# Patient Record
Sex: Female | Born: 1960 | Race: Black or African American | Hispanic: No | Marital: Single | Smoking: Never smoker
Health system: Southern US, Community
[De-identification: ages and names within clinical notes are randomized; demographics above are authoritative.]

---

## 2021-05-13 ENCOUNTER — Encounter (HOSPITAL_COMMUNITY): Payer: Self-pay

## 2021-05-13 ENCOUNTER — Inpatient Hospital Stay (HOSPITAL_COMMUNITY)
Admission: EM | Admit: 2021-05-13 | Discharge: 2021-05-30 | DRG: 024 | Disposition: A | Discharge status: Rehabilitation Facility | Payer: BLUE CROSS/BLUE SHIELD | Attending: Internal Medicine | Admitting: Internal Medicine

## 2021-05-13 DIAGNOSIS — R414 Neurologic neglect syndrome: Secondary | ICD-10-CM | POA: Diagnosis present

## 2021-05-13 DIAGNOSIS — I618 Other nontraumatic intracerebral hemorrhage: Principal | ICD-10-CM | POA: Diagnosis present

## 2021-05-13 DIAGNOSIS — G911 Obstructive hydrocephalus: Secondary | ICD-10-CM | POA: Diagnosis present

## 2021-05-13 DIAGNOSIS — G8101 Flaccid hemiplegia affecting right dominant side: Secondary | ICD-10-CM | POA: Diagnosis present

## 2021-05-13 DIAGNOSIS — I619 Nontraumatic intracerebral hemorrhage, unspecified: Secondary | ICD-10-CM

## 2021-05-13 DIAGNOSIS — R29810 Facial weakness: Secondary | ICD-10-CM | POA: Diagnosis present

## 2021-05-13 DIAGNOSIS — R509 Fever, unspecified: Secondary | ICD-10-CM | POA: Diagnosis not present

## 2021-05-13 DIAGNOSIS — I610 Nontraumatic intracerebral hemorrhage in hemisphere, subcortical: Secondary | ICD-10-CM | POA: Diagnosis not present

## 2021-05-13 DIAGNOSIS — E079 Disorder of thyroid, unspecified: Secondary | ICD-10-CM | POA: Diagnosis present

## 2021-05-13 DIAGNOSIS — G9389 Other specified disorders of brain: Secondary | ICD-10-CM | POA: Diagnosis present

## 2021-05-13 DIAGNOSIS — I5032 Chronic diastolic (congestive) heart failure: Secondary | ICD-10-CM | POA: Diagnosis present

## 2021-05-13 DIAGNOSIS — E785 Hyperlipidemia, unspecified: Secondary | ICD-10-CM | POA: Diagnosis present

## 2021-05-13 DIAGNOSIS — N898 Other specified noninflammatory disorders of vagina: Secondary | ICD-10-CM

## 2021-05-13 DIAGNOSIS — R471 Dysarthria and anarthria: Secondary | ICD-10-CM | POA: Diagnosis present

## 2021-05-13 DIAGNOSIS — Z7982 Long term (current) use of aspirin: Secondary | ICD-10-CM

## 2021-05-13 DIAGNOSIS — J301 Allergic rhinitis due to pollen: Secondary | ICD-10-CM | POA: Diagnosis present

## 2021-05-13 DIAGNOSIS — Z20822 Contact with and (suspected) exposure to covid-19: Secondary | ICD-10-CM | POA: Diagnosis present

## 2021-05-13 DIAGNOSIS — K5900 Constipation, unspecified: Secondary | ICD-10-CM

## 2021-05-13 DIAGNOSIS — R197 Diarrhea, unspecified: Secondary | ICD-10-CM

## 2021-05-13 DIAGNOSIS — I110 Hypertensive heart disease with heart failure: Secondary | ICD-10-CM | POA: Diagnosis present

## 2021-05-13 DIAGNOSIS — J9601 Acute respiratory failure with hypoxia: Secondary | ICD-10-CM

## 2021-05-13 DIAGNOSIS — I161 Hypertensive emergency: Secondary | ICD-10-CM | POA: Diagnosis present

## 2021-05-13 DIAGNOSIS — Z09 Encounter for follow-up examination after completed treatment for conditions other than malignant neoplasm: Secondary | ICD-10-CM

## 2021-05-13 DIAGNOSIS — D72829 Elevated white blood cell count, unspecified: Secondary | ICD-10-CM | POA: Diagnosis not present

## 2021-05-13 DIAGNOSIS — R4701 Aphasia: Secondary | ICD-10-CM | POA: Diagnosis present

## 2021-05-13 DIAGNOSIS — I615 Nontraumatic intracerebral hemorrhage, intraventricular: Secondary | ICD-10-CM

## 2021-05-13 DIAGNOSIS — G9349 Other encephalopathy: Secondary | ICD-10-CM

## 2021-05-13 DIAGNOSIS — R1310 Dysphagia, unspecified: Secondary | ICD-10-CM | POA: Diagnosis present

## 2021-05-13 DIAGNOSIS — I10 Essential (primary) hypertension: Secondary | ICD-10-CM

## 2021-05-13 DIAGNOSIS — R29717 NIHSS score 17: Secondary | ICD-10-CM | POA: Diagnosis present

## 2021-05-13 DIAGNOSIS — R7303 Prediabetes: Secondary | ICD-10-CM | POA: Diagnosis present

## 2021-05-13 DIAGNOSIS — E861 Hypovolemia: Secondary | ICD-10-CM | POA: Diagnosis not present

## 2021-05-13 DIAGNOSIS — Z4659 Encounter for fitting and adjustment of other gastrointestinal appliance and device: Secondary | ICD-10-CM

## 2021-05-13 DIAGNOSIS — N760 Acute vaginitis: Secondary | ICD-10-CM | POA: Diagnosis not present

## 2021-05-13 DIAGNOSIS — R451 Restlessness and agitation: Secondary | ICD-10-CM | POA: Diagnosis not present

## 2021-05-13 DIAGNOSIS — E871 Hypo-osmolality and hyponatremia: Secondary | ICD-10-CM | POA: Diagnosis not present

## 2021-05-13 MED ORDER — NICARDIPINE HCL IN NACL 20-0.86 MG/200ML-% IV SOLN
3.0000 mg/h | INTRAVENOUS | Status: DC
  Administered 2021-05-13: 6 mg/h via INTRAVENOUS

## 2021-05-13 NOTE — ED Notes (Signed)
HOB at 30

## 2021-05-13 NOTE — ED Triage Notes (Signed)
Pt arrives via [facility] from [facility] in [place] for a hemorrhagic stroke in her thalmus. Last known normal was 19:00. Pt with expressive aphasia and R sided weakness. Pt on nicardipine at 5mg /hr.

## 2021-05-13 NOTE — ED Notes (Signed)
Nicardipine changed to 6mg /hr

## 2021-05-13 NOTE — ED Provider Notes (Signed)
[facility] EMERGENCY DEPARTMENT Provider Note   CSN: [id number] Arrival date & time: 05/13/21  2247     History Chief Complaint  Patient presents with  . Cerebrovascular Accident    hemorrhagic    Marissa Morris is a 60 y.o. female.  The history is provided by the patient and medical records.  Cerebrovascular Accident    60 y.o. F presenting to the ED from [facility] in [place] as a transfer for hemorrhagic stroke.  Patient presented there with right sided weakness, facial droop, difficulty answering questions, and some generalized confusion.  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  She was started on cardene and given keppra for seizure prophylaxis.  Case was discussed with neurology here, Dr. Otelia Limes who accepted in transfer.  Patient awake, alert on arrival, able to answer simple questions.  BP reasonably well controlled 150's systolic, currently on 6mg /hr cardene.  No past medical history on file.  There are no problems to display for this patient.     OB History   No obstetric history on file.     No family history on file.     Home Medications Prior to Admission medications   Not on File    Allergies    Patient has no allergy information on record.  Review of Systems   Review of Systems  Neurological: Positive for speech difficulty and weakness.  All other systems reviewed and are negative.   Physical Exam Updated Vital Signs BP (!) 151/76 (BP Location: Left Arm)   Pulse 90   Temp 98.5 F (36.9 C) (Oral)   Resp (!) 21   Wt 75.3 kg   SpO2 95%   Physical Exam Vitals and nursing note reviewed.  Constitutional:      Appearance: She is well-developed.  HENT:     Head: Normocephalic and atraumatic.  Eyes:     Conjunctiva/sclera: Conjunctivae normal.     Pupils: Pupils are equal, round, and reactive to light.  Cardiovascular:     Rate and Rhythm: Normal rate and regular rhythm.     Heart sounds: Normal heart  sounds.  Pulmonary:     Effort: Pulmonary effort is normal.     Breath sounds: Normal breath sounds.  Abdominal:     General: Bowel sounds are normal.     Palpations: Abdomen is soft.  Musculoskeletal:        General: Normal range of motion.     Cervical back: Normal range of motion.  Skin:    General: Skin is warm and dry.  Neurological:     Mental Status: She is alert.     Comments: Awake, alert, nods head "yes" and "no" but not really giving a lot of verbal responses, right arm weakness noted compared with left, seemingly equal strength BLE     ED Results / Procedures / Treatments   Labs (all labs ordered are listed, but only abnormal results are displayed) Labs Reviewed  RESP PANEL BY RT-PCR (FLU A&B, COVID) ARPGX2  MRSA PCR SCREENING  HIV ANTIBODY (ROUTINE TESTING W REFLEX)  CBC  COMPREHENSIVE METABOLIC PANEL  PROTIME-INR  APTT  LIPID PANEL  HEMOGLOBIN A1C  URINALYSIS, ROUTINE W REFLEX MICROSCOPIC  RAPID URINE DRUG SCREEN, HOSP PERFORMED    EKG None  Radiology CT HEAD WO CONTRAST  Result Date: 05/14/2021 CLINICAL DATA:  Intraparenchymal hemorrhage. Follow-up of scans performed at [place] [place]. EXAM: CT HEAD WITHOUT CONTRAST TECHNIQUE: Contiguous axial images were obtained from  the base of the skull through the vertex without intravenous contrast. COMPARISON:  None. I am not able to view the outside institution scans. FINDINGS: Brain: Intraparenchymal hematoma centered in the left thalamus measures 3.2 x 2.2 x 3.0 cm (volume = 11 cm^3). There is moderate hydrocephalus with periventricular hypoattenuation suggesting transependymal interstitial edema. There is rightward bulging of the septum pellucidum. Large amount of blood in the ventricles. Vascular: No abnormal hyperdensity of the major intracranial arteries or dural venous sinuses. No intracranial atherosclerosis. Skull: The visualized skull base, calvarium and extracranial soft tissues are normal.  Sinuses/Orbits: No fluid levels or advanced mucosal thickening of the visualized paranasal sinuses. No mastoid or middle ear effusion. The orbits are normal. IMPRESSION: 1. Intraparenchymal hematoma centered in the left thalamus with intraventricular extension and moderate hydrocephalus with transependymal interstitial edema. Critical Value/emergent results were called by telephone at the time of interpretation on 05/14/2021 at 12:58 am to provider [facility] , who verbally acknowledged these results. Electronically Signed   By: Deatra Robinson M.D.   On: 05/14/2021 00:58    Procedures Procedures   CRITICAL CARE Performed by: Garlon Hatchet   Total critical care time: 40  minutes  Critical care time was exclusive of separately billable procedures and treating other patients.  Critical care was necessary to treat or prevent imminent or life-threatening deterioration.  Critical care was time spent personally by me on the following activities: development of treatment plan with patient and/or surrogate as well as nursing, discussions with consultants, evaluation of patient's response to treatment, examination of patient, obtaining history from patient or surrogate, ordering and performing treatments and interventions, ordering and review of laboratory studies, ordering and review of radiographic studies, pulse oximetry and re-evaluation of patient's condition.   Medications Ordered in ED Medications  nicardipine (CARDENE) 20mg  in 0.86% saline IV infusion (0.1 mg/ml) (7 mg/hr Intravenous Rate/Dose Change 05/13/21 2328)    ED Course  I have reviewed the triage vital signs and the nursing notes.  Pertinent labs & imaging results that were available during my care of the patient were reviewed by me and considered in my medical decision making (see chart for details).    MDM Rules/Calculators/A&P  59 y.o. F presenting to the ED from OSH [facility]) for hemorrhagic stroke.  Found to have  slurred speech, right sided weakness, and cognitive delays on arrival to their ED, work-up revealed left thalamic hemorrhagic stroke.  She was started on cardene and loaded with IV keppra for seizure prophylaxis and transferred here.  No reported history of stroke in the past.  On arrival to our ED, she is awake but drowsy.  She does nod her head to answer questions but not giving many verbal responses.  She is starting to move right arm (chart from [facility] reports "flaccid RUE") but notably weak compared with left.  She remains on 6mg /hr cardene and BP is stable currently.  Notified neurology, Dr. [facility] of patient's arrival in the ED-- he will admit to ICU.  Covid screen has been sent.  Final Clinical Impression(s) / ED Diagnoses Final diagnoses:  Hemorrhagic stroke [facility])    Rx / DC Orders ED Discharge Orders    None       Otelia Limes, PA-C 05/14/21 0127    Garlon Hatchet, MD 05/14/21 0236

## 2021-05-13 NOTE — H&P (Addendum)
Admission H&P    Chief Complaint: Right sided weakness and headache  HPI: Marissa Morris is a 60 y.o. female presenting to [facility] in transfer from [facility] [phone]) for management of acute intracranial hemorrhage. Per records from [place], EMS had been called to the patient's home and on arrival the patient was found to be slumped over in a chair. She projectile vomited and then was noted to be flaccid on the right with right facial droop and inability to correctly answer questions. On arrival to the ED, BP was 222/128. CT head revealed a 3 cm thalamic hemorrhage with intraventricular extension.   Initial NIHSS was 25. Initial exam by EDP at OSH: Patient awake with slow but comprehensible speech. Oriented to person. Confused. CN were normal. LUE and LLE 5/5, RUE and RLE with flaccid paralysis. Follow up NIHSS was 17.  BP was managed with hydralazine and nicardipine, bringing it down to 163/77. Ondansetron was given for nausea. Keppra 1000 mg was loaded for seizure prophylaxis (Note: no seizures per EDP at OSH).   She is not on and antiplatelet medication or a blood thinner per OSH documentation. Did not fall and strike her head prior to the hemorrhage. Per OSH documents, her PMHx, PSHx, and home meds were unknown. No information on drug allergies.   CT head at OSH (6/1, 8:24 PM): Left thalamic hemorrhage measuring 3 cm with extension into the lateral, third and 4 th ventricles. The ventricles appear mildly enlarged suggestive of early mild hydrocephalus. There is no midline shift or herniation. Diffuse periventricular hyupoattenuation and small right corona radiata lacunar infarct are noted.   EKG: NSR Left ventricular hypertrophy Prolonged QT Nonspecific ST and T wave abnormality  Labs at [facility]: Na 140 K 3.4 Cl 102, Ca 8.9 BUN 14 Cr 0.96 Serum glucose 163 WBC 14.7 Hgb 13.4 Platelets 214 PT 10.6 INR 1.0 PTT 24.5  History reviewed. No  pertinent past medical history.  History reviewed. No pertinent surgical history.  No family history on file. Social History:  has no history on file for tobacco use, alcohol use, and drug use.  Allergies: No Known Allergies  (Not in a hospital admission)   ROS: Unable to obtain due to somnolence.   Physical Examination: Blood pressure (!) 155/83, pulse 91, temperature 98.2 F (36.8 C), temperature source Oral, resp. rate (!) 23, weight 75.3 kg, SpO2 100 %.  HEENT-  [place]/AT  Cardiovascular - RRR, S1 and S2 normal Lungs - Respirations unlabored with equal breath sounds bilaterally.No rhonchi.  Abdomen - Soft and NT with normal bowel sounds Extremities - No edema. Warm and well perfused  Neurologic Examination: Mental Status: Somnolent. Does not open eyes to voice initially, but does open after prolonged sternal rub. She is nonverbal. She murmurs and nods her head to some questions then falls back asleep. She does not follow commands, but exhibits semipurposeful left sided movements to noxious stimuli. GCS 7.  Cranial Nerves: II:  Does not blink to threat when eyelids are held open. PERRL at 2 mm.  III,IV, VI: With eyes open, some roving EOM noted conjugately. With noxious stimulation the patient did briefly fixate on examiner's face and track from left to right.  V,VII: No facial droop, but patient did not grimace to noxious. She does furrow brow on L > R to brow ridge pressure and responds more briskly on the left.  VIII: Did murmur incomprehensibly in response to some questions.  IX,X: Unable to assess.  XI: Head is near  the midline XII: Did not protrude tongue to command.  Motor/Sensory: RUE with increased flexor tone at elbow, wrist and digits; also adducted at the shoulder. Does not move to command or to noxious, but will grimace.  RLE with weak withdrawal to noxious plantar stimulation. Responds less briskly than on the left.  LUE and LLE with 5/5 semipurposeful movement to  noxious . Deep Tendon Reflexes:  3+ bilateral brachioradialis and patellae.  Right toe upgoing, left toe equivocal.  Cerebellar: Unable to assess Gait: Unable to assess  No results found for this or any previous visit (from the past 48 hour(s)). No results found.   Assessment: 60 year old female with acute left thalamic ICH with intraventricular extension 1. Exam reveals a somnolent patient with aphasia, plegic RUE, paretic RLE and right sided sensory deficit 2. CT head at OSH (6/1, 8:24 PM): Left thalamic hemorrhage measuring 3 cm with extension into the lateral, third and 4 th ventricles. The ventricles appear mildly enlarged suggestive of early mild hydrocephalus. There is no midline shift or herniation. Diffuse periventricular hyupoattenuation and small right corona radiata lacunar infarct are noted.  3. Follow up CT head at [facility] (6/2, 12:58 AM): Intraparenchymal hematoma centered in the left thalamus with intraventricular extension and moderate hydrocephalus with transependymal interstitial edema. 4. Most likely etiology for the ICH is severe HTN  Plan: 1. Admit to ICU under the Neurology service 2. MRI/MRA of head 3. Cardiac telemetry 4. TTE 5. PT consult, OT consult, Speech consult 6. BP management with clevidipine drip. SBP goal < 140 7. Frequent neuro checks 8. No antiplatelet medications or anticoagulants. DVT prophylaxis with SCDs 9. Neurosurgery has been consulted. They have recommended repeat CT head at 0700 to assess for stability 10. Will need to obtain medical records from her PCP  Addendum: Repeat CT head at 0621 reveals similar size of the acute left thalamic intraparenchymal hemorrhage. Similar intraventricular extension of hemorrhage and ventriculomegaly.  60 minutes spent in the neurological evaluation and management of this critically ill patient.   Electronically signed: Dr. Caryl Pina 05/13/2021, 11:41 PM

## 2021-05-14 ENCOUNTER — Inpatient Hospital Stay (HOSPITAL_COMMUNITY): Payer: BLUE CROSS/BLUE SHIELD

## 2021-05-14 ENCOUNTER — Encounter (HOSPITAL_COMMUNITY): Payer: Self-pay | Admitting: Neurology

## 2021-05-14 ENCOUNTER — Emergency Department (HOSPITAL_COMMUNITY): Payer: BLUE CROSS/BLUE SHIELD

## 2021-05-14 DIAGNOSIS — R531 Weakness: Secondary | ICD-10-CM | POA: Diagnosis not present

## 2021-05-14 DIAGNOSIS — I619 Nontraumatic intracerebral hemorrhage, unspecified: Secondary | ICD-10-CM | POA: Diagnosis present

## 2021-05-14 DIAGNOSIS — N760 Acute vaginitis: Secondary | ICD-10-CM | POA: Diagnosis not present

## 2021-05-14 DIAGNOSIS — I615 Nontraumatic intracerebral hemorrhage, intraventricular: Secondary | ICD-10-CM

## 2021-05-14 DIAGNOSIS — R7303 Prediabetes: Secondary | ICD-10-CM | POA: Diagnosis present

## 2021-05-14 DIAGNOSIS — I69151 Hemiplegia and hemiparesis following nontraumatic intracerebral hemorrhage affecting right dominant side: Secondary | ICD-10-CM | POA: Diagnosis not present

## 2021-05-14 DIAGNOSIS — I161 Hypertensive emergency: Secondary | ICD-10-CM | POA: Diagnosis present

## 2021-05-14 DIAGNOSIS — R471 Dysarthria and anarthria: Secondary | ICD-10-CM | POA: Diagnosis present

## 2021-05-14 DIAGNOSIS — G911 Obstructive hydrocephalus: Secondary | ICD-10-CM | POA: Diagnosis present

## 2021-05-14 DIAGNOSIS — E079 Disorder of thyroid, unspecified: Secondary | ICD-10-CM | POA: Diagnosis present

## 2021-05-14 DIAGNOSIS — R451 Restlessness and agitation: Secondary | ICD-10-CM | POA: Diagnosis not present

## 2021-05-14 DIAGNOSIS — M1711 Unilateral primary osteoarthritis, right knee: Secondary | ICD-10-CM | POA: Diagnosis not present

## 2021-05-14 DIAGNOSIS — I610 Nontraumatic intracerebral hemorrhage in hemisphere, subcortical: Secondary | ICD-10-CM

## 2021-05-14 DIAGNOSIS — I5032 Chronic diastolic (congestive) heart failure: Secondary | ICD-10-CM | POA: Diagnosis present

## 2021-05-14 DIAGNOSIS — R4182 Altered mental status, unspecified: Secondary | ICD-10-CM | POA: Diagnosis not present

## 2021-05-14 DIAGNOSIS — I110 Hypertensive heart disease with heart failure: Secondary | ICD-10-CM | POA: Diagnosis present

## 2021-05-14 DIAGNOSIS — R1310 Dysphagia, unspecified: Secondary | ICD-10-CM | POA: Diagnosis present

## 2021-05-14 DIAGNOSIS — I10 Essential (primary) hypertension: Secondary | ICD-10-CM | POA: Diagnosis not present

## 2021-05-14 DIAGNOSIS — M792 Neuralgia and neuritis, unspecified: Secondary | ICD-10-CM | POA: Diagnosis not present

## 2021-05-14 DIAGNOSIS — J9601 Acute respiratory failure with hypoxia: Secondary | ICD-10-CM | POA: Diagnosis not present

## 2021-05-14 DIAGNOSIS — I618 Other nontraumatic intracerebral hemorrhage: Secondary | ICD-10-CM | POA: Diagnosis present

## 2021-05-14 DIAGNOSIS — R29810 Facial weakness: Secondary | ICD-10-CM | POA: Diagnosis present

## 2021-05-14 DIAGNOSIS — E871 Hypo-osmolality and hyponatremia: Secondary | ICD-10-CM | POA: Diagnosis not present

## 2021-05-14 DIAGNOSIS — I639 Cerebral infarction, unspecified: Secondary | ICD-10-CM | POA: Diagnosis not present

## 2021-05-14 DIAGNOSIS — E785 Hyperlipidemia, unspecified: Secondary | ICD-10-CM | POA: Diagnosis present

## 2021-05-14 DIAGNOSIS — R509 Fever, unspecified: Secondary | ICD-10-CM | POA: Diagnosis not present

## 2021-05-14 DIAGNOSIS — E861 Hypovolemia: Secondary | ICD-10-CM | POA: Diagnosis not present

## 2021-05-14 DIAGNOSIS — R4701 Aphasia: Secondary | ICD-10-CM | POA: Diagnosis present

## 2021-05-14 DIAGNOSIS — I6389 Other cerebral infarction: Secondary | ICD-10-CM

## 2021-05-14 DIAGNOSIS — R414 Neurologic neglect syndrome: Secondary | ICD-10-CM | POA: Diagnosis present

## 2021-05-14 DIAGNOSIS — G9349 Other encephalopathy: Secondary | ICD-10-CM | POA: Diagnosis present

## 2021-05-14 DIAGNOSIS — G9389 Other specified disorders of brain: Secondary | ICD-10-CM | POA: Diagnosis present

## 2021-05-14 DIAGNOSIS — D72829 Elevated white blood cell count, unspecified: Secondary | ICD-10-CM | POA: Diagnosis not present

## 2021-05-14 DIAGNOSIS — Z20822 Contact with and (suspected) exposure to covid-19: Secondary | ICD-10-CM | POA: Diagnosis present

## 2021-05-14 DIAGNOSIS — G8101 Flaccid hemiplegia affecting right dominant side: Secondary | ICD-10-CM | POA: Diagnosis present

## 2021-05-14 LAB — PROTIME-INR
INR: 1 (ref 0.8–1.2)
Prothrombin Time: 13.1 seconds (ref 11.4–15.2)

## 2021-05-14 LAB — URINALYSIS, ROUTINE W REFLEX MICROSCOPIC
Bacteria, UA: NONE SEEN
Bilirubin Urine: NEGATIVE
Glucose, UA: NEGATIVE mg/dL
Hgb urine dipstick: NEGATIVE
Ketones, ur: NEGATIVE mg/dL
Nitrite: NEGATIVE
Protein, ur: NEGATIVE mg/dL
Specific Gravity, Urine: 1.008 (ref 1.005–1.030)
pH: 9 — ABNORMAL HIGH (ref 5.0–8.0)

## 2021-05-14 LAB — COMPREHENSIVE METABOLIC PANEL
ALT: 23 U/L (ref 0–44)
AST: 33 U/L (ref 15–41)
Albumin: 4.1 g/dL (ref 3.5–5.0)
Alkaline Phosphatase: 84 U/L (ref 38–126)
Anion gap: 9 (ref 5–15)
BUN: 10 mg/dL (ref 6–20)
CO2: 27 mmol/L (ref 22–32)
Calcium: 8.8 mg/dL — ABNORMAL LOW (ref 8.9–10.3)
Chloride: 105 mmol/L (ref 98–111)
Creatinine, Ser: 0.83 mg/dL (ref 0.44–1.00)
GFR, Estimated: >60 mL/min (ref >60)
Glucose, Bld: 117 mg/dL — ABNORMAL HIGH (ref 70–99)
Potassium: 3.5 mmol/L (ref 3.5–5.1)
Sodium: 141 mmol/L (ref 135–145)
Total Bilirubin: 0.7 mg/dL (ref 0.3–1.2)
Total Protein: 7.6 g/dL (ref 6.5–8.1)

## 2021-05-14 LAB — RAPID URINE DRUG SCREEN, HOSP PERFORMED
Amphetamines: NOT DETECTED
Barbiturates: NOT DETECTED
Benzodiazepines: NOT DETECTED
Cocaine: NOT DETECTED
Opiates: NOT DETECTED
Tetrahydrocannabinol: NOT DETECTED

## 2021-05-14 LAB — LIPID PANEL
Cholesterol: 203 mg/dL — ABNORMAL HIGH (ref 0–200)
HDL: 68 mg/dL (ref >40)
LDL Cholesterol: 121 mg/dL — ABNORMAL HIGH (ref 0–99)
Total CHOL/HDL Ratio: 3 RATIO
Triglycerides: 71 mg/dL (ref <150)
VLDL: 14 mg/dL (ref 0–40)

## 2021-05-14 LAB — APTT: aPTT: 28 seconds (ref 24–36)

## 2021-05-14 LAB — CBC
HCT: 42.9 % (ref 36.0–46.0)
Hemoglobin: 13.6 g/dL (ref 12.0–15.0)
MCH: 31.6 pg (ref 26.0–34.0)
MCHC: 31.7 g/dL (ref 30.0–36.0)
MCV: 99.8 fL (ref 80.0–100.0)
Platelets: 192 10*3/uL (ref 150–400)
RBC: 4.3 MIL/uL (ref 3.87–5.11)
RDW: 13.1 % (ref 11.5–15.5)
WBC: 12.8 10*3/uL — ABNORMAL HIGH (ref 4.0–10.5)
nRBC: 0 % (ref 0.0–0.2)

## 2021-05-14 LAB — RESP PANEL BY RT-PCR (FLU A&B, COVID) ARPGX2
Influenza A by PCR: NEGATIVE
Influenza B by PCR: NEGATIVE
SARS Coronavirus 2 by RT PCR: NEGATIVE

## 2021-05-14 LAB — MRSA PCR SCREENING: MRSA by PCR: NEGATIVE

## 2021-05-14 LAB — GLUCOSE, CAPILLARY: Glucose-Capillary: 107 mg/dL — ABNORMAL HIGH (ref 70–99)

## 2021-05-14 LAB — HIV ANTIBODY (ROUTINE TESTING W REFLEX): HIV Screen 4th Generation wRfx: NONREACTIVE

## 2021-05-14 IMAGING — CT CT HEAD W/O CM
4 series · 16 of 47 positions shown, 18 images · non-contrast
Comparison: Same day CT head.

CLINICAL DATA: Intraparenchymal hemorrhage.  Follow-up.

EXAM:
CT HEAD WITHOUT CONTRAST
TECHNIQUE: Contiguous axial images were obtained from the base of the skull
through the vertex without intravenous contrast.

[Series 3: head wo · axial · 0.42mm/px · z∈[-234,-114]mm · 7 of 33 slices shown, 9 images]
[im 5/33  brain]
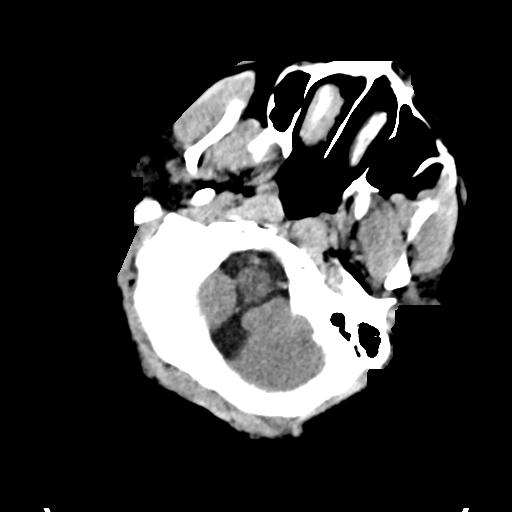
[im 5/33  bone]
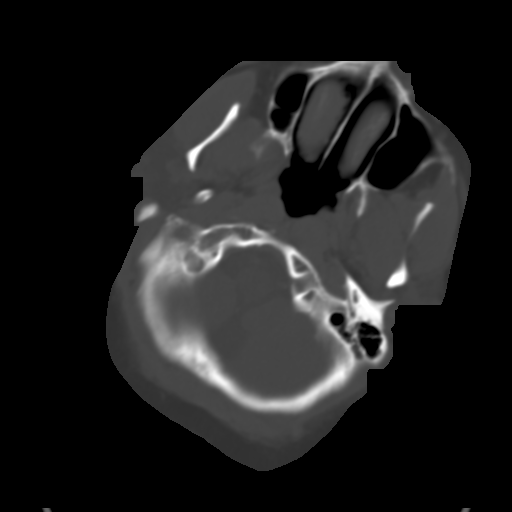
[im 9/33  brain]
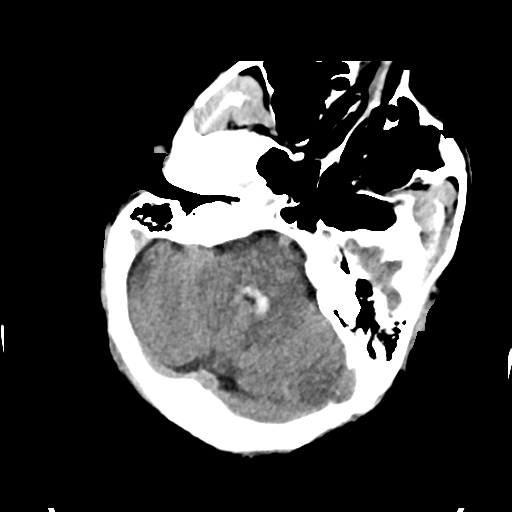
[im 13/33  brain]
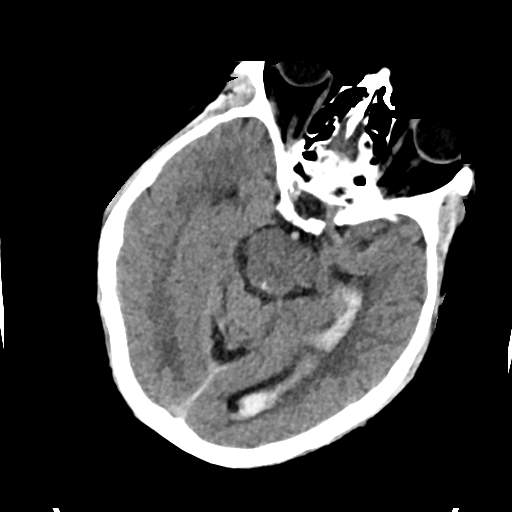
[im 17/33  brain]
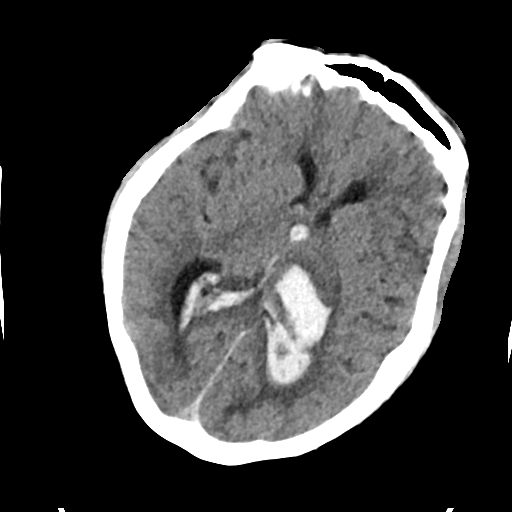
[im 21/33  brain]
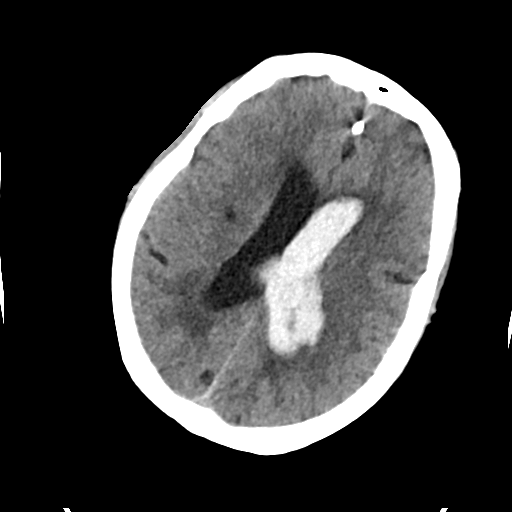
[im 21/33  bone]
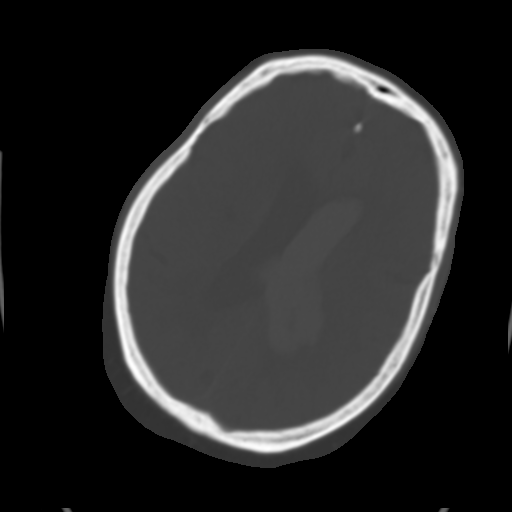
[im 25/33  brain]
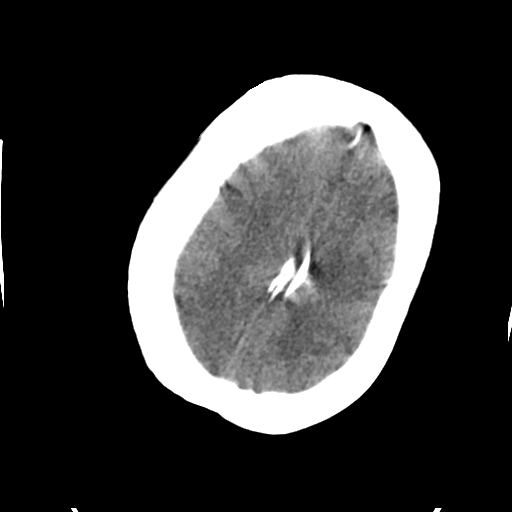
[im 29/33  brain]
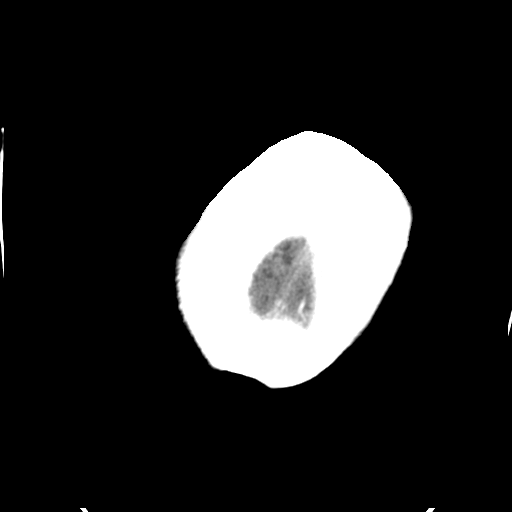

[Series 4: head bone · axial · 0.42mm/px · z∈[-238,-206]mm · 3 of 81 slices shown]
[im 9/81  bone]
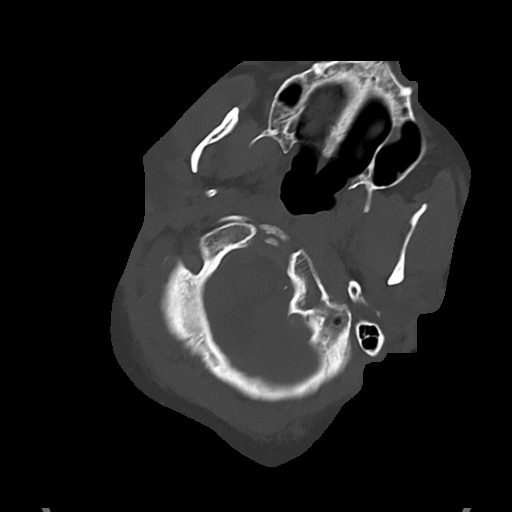
[im 17/81  bone]
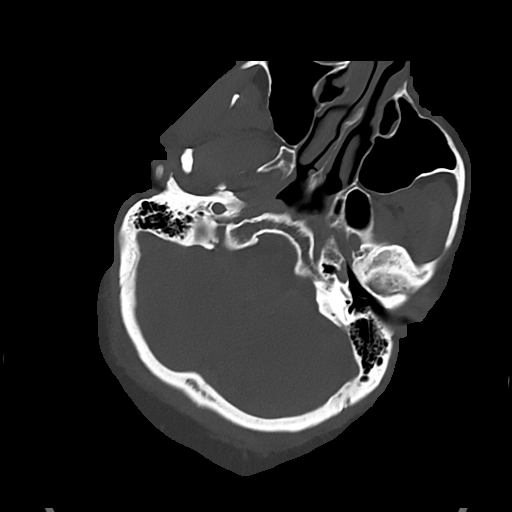
[im 25/81  bone]
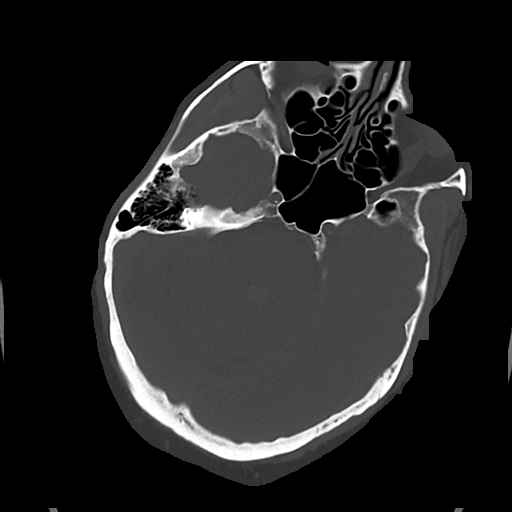

[Series 5: cor soft · coronal · 0.34mm/px · 3 of 75 slices shown]
[im 25/75  brain]
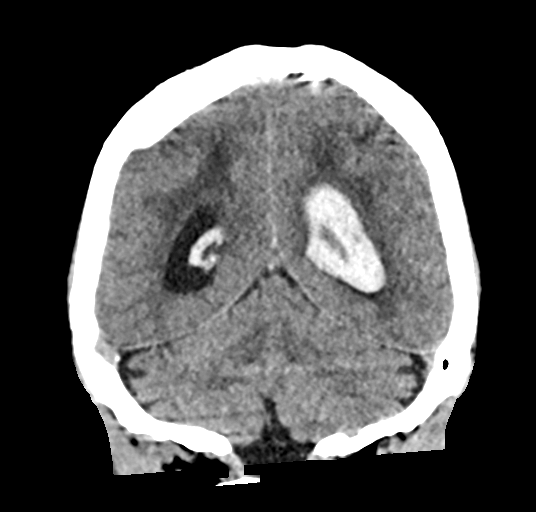
[im 33/75  brain]
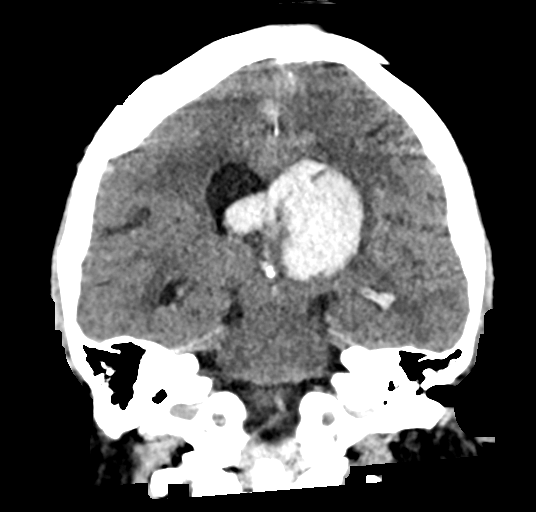
[im 42/75  brain]
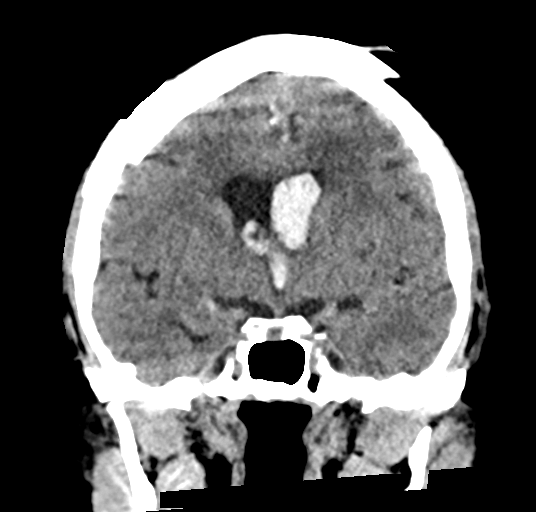

[Series 6: sag soft · sagittal · 0.34mm/px · 3 of 60 slices shown]
[im 20/60  brain]
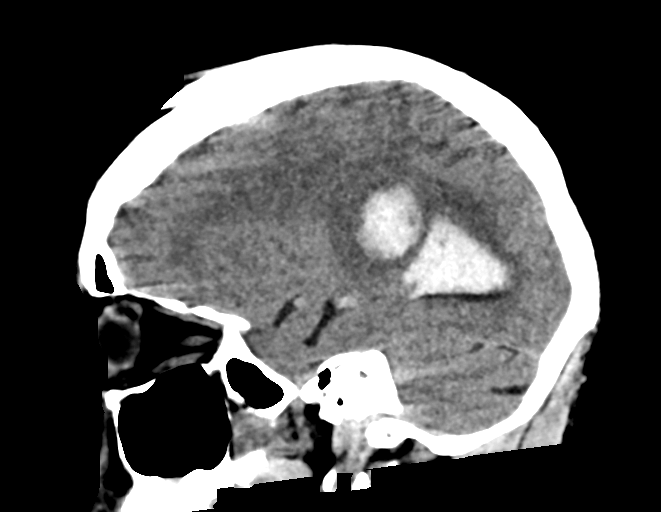
[im 30/60  brain]
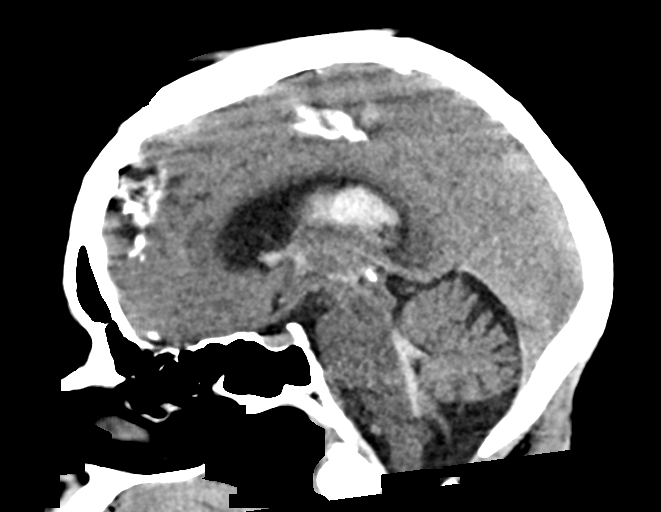
[im 40/60  brain]
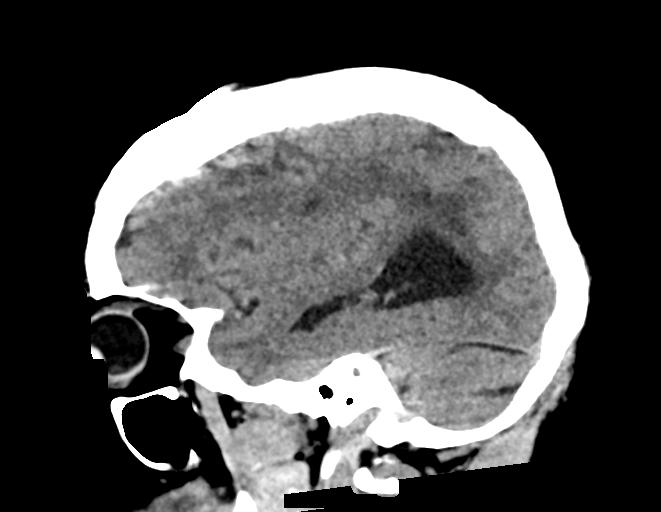

[16 of 47 positions shown; findings below may reference images not displayed]

FINDINGS: Mildly motion limited exam.  Within this limitation:

Brain: When accounting for differences in technique, similar size of
the acute intraparenchymal hemorrhage centered in the left thalamus
which measures up to 3.3 x 2.2 x 3.3 cm (similar when remeasured on
the prior). Similar regional mass effect. Similar intraventricular
hemorrhage within the left greater than right lateral ventricles,
third ventricle, and fourth ventricle. Similar ventriculomegaly with
mild periventricular edema and prominence of the temporal horns.
Additional scattered white matter hypoattenuation is nonspecific but
most likely related to chronic microvascular ischemic disease. No
evidence of acute large vascular territory infarct. Similar
suspected prior infarct in the right corona radiata.

Vascular: No hyperdense vessel identified. Calcific atherosclerosis.

Skull: No acute fracture.

Sinuses/Orbits: No acute findings.

Other: No mastoid effusions.
IMPRESSION: When accounting for differences in technique, similar size of the
acute left thalamic intraparenchymal hemorrhage. Similar
intraventricular extension of hemorrhage and ventriculomegaly.

## 2021-05-14 IMAGING — MR MR MRA HEAD W/O CM
7 series · 33 of 48 positions shown · non-contrast
Comparison: No pertinent prior exam.
COMPARISON: Correlation made with prior head CT

CLINICAL DATA: Intracranial hemorrhage

EXAM:
MRI HEAD WITHOUT CONTRAST
MRA HEAD WITHOUT CONTRAST
TECHNIQUE: Multiplanar, multi-echo pulse sequences of the brain and surrounding
structures were acquired without intravenous contrast. Angiographic
images of the Circle of Willis were acquired using MRA technique
without intravenous contrast.

[Series 3: DWI · axial · 3.0mm · 1.09mm/px · z∈[-28,+110]mm · 8 of 94 slices shown (1 of 3)]
[im 1/94]
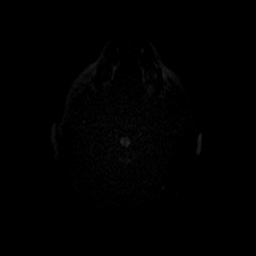
[im 11/94]
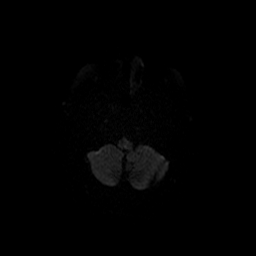
[im 32/94]
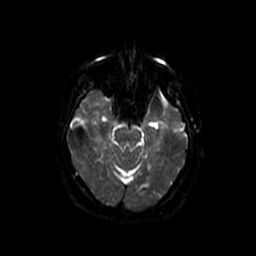
[im 42/94]
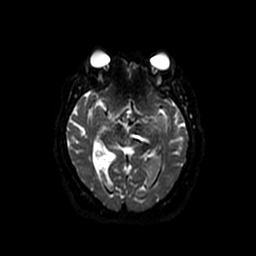
[im 52/94]
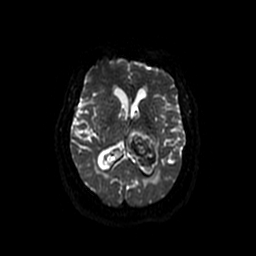
[im 63/94]
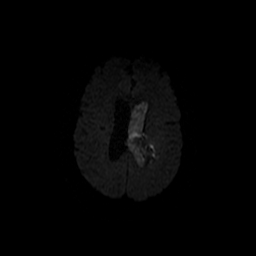
[im 83/94]
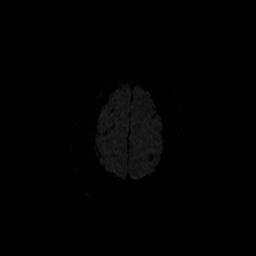
[im 94/94]
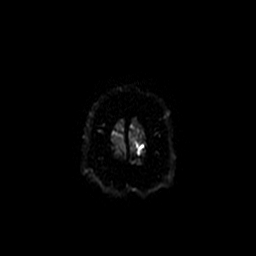

[Series 4: (id) mt fs · axial · 1.4mm · 0.43mm/px · z∈[-11,+14]mm · 3 of 136 slices shown]
[im 10/136]
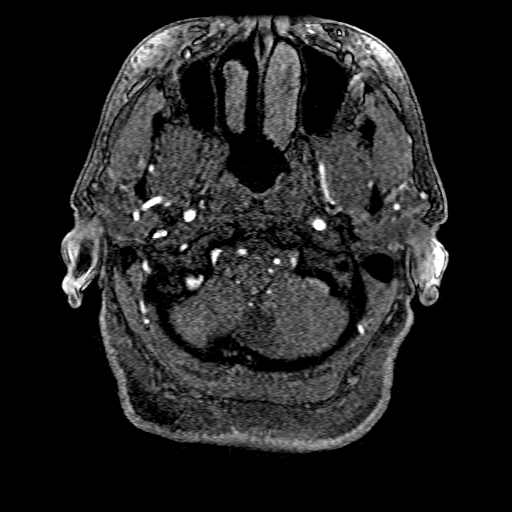
[im 28/136]
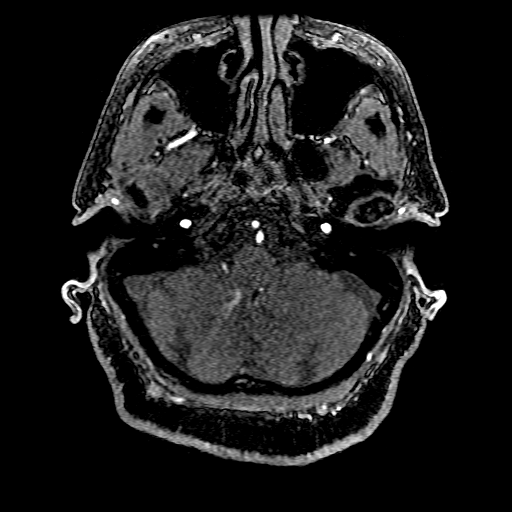
[im 46/136]
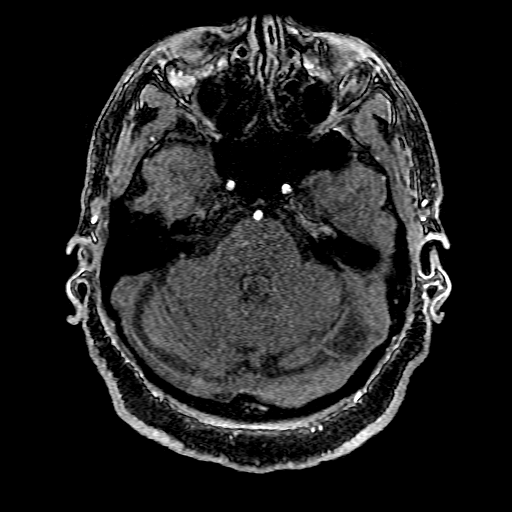

[Series 5: DWI · coronal · 5.0mm · 1.09mm/px · 8 of 66 slices shown (2 of 3)]
[im 1/66]
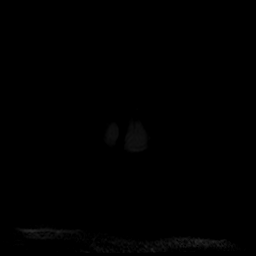
[im 10/66]
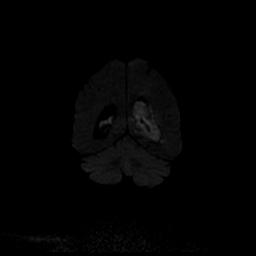
[im 19/66]
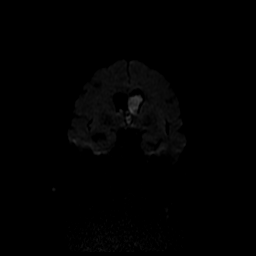
[im 28/66]
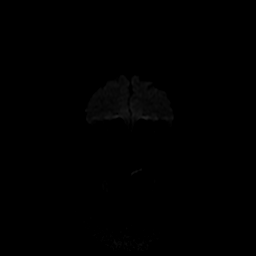
[im 38/66]
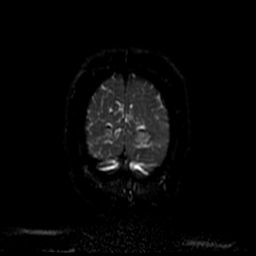
[im 47/66]
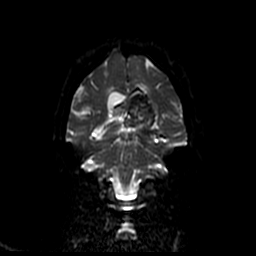
[im 56/66]
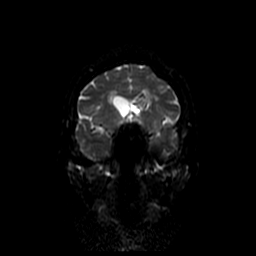
[im 66/66]
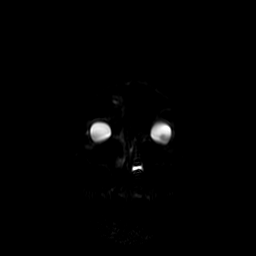

[Series 6: T1 · sagittal · 5.0mm · 0.47mm/px · 3 of 23 slices shown]
[im 1/23]
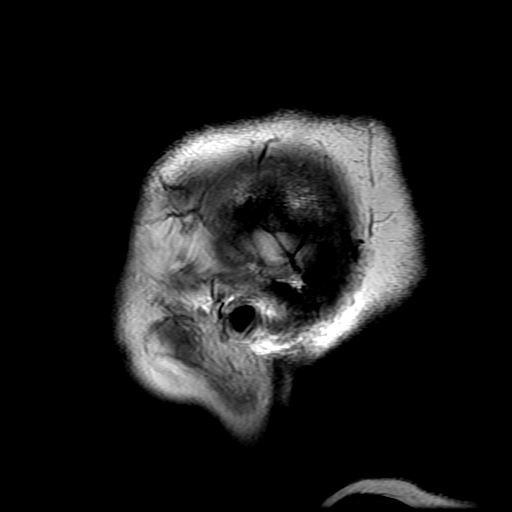
[im 12/23]
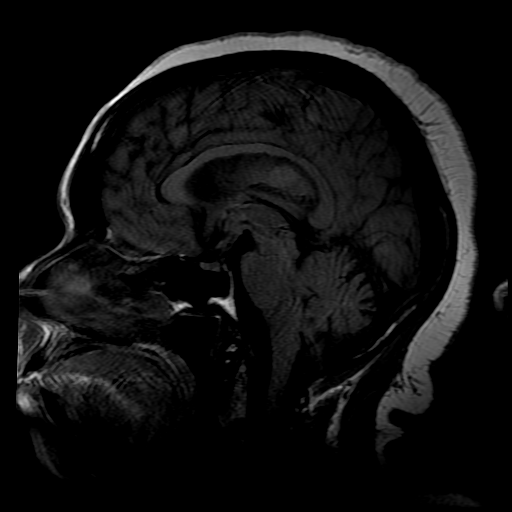
[im 23/23]
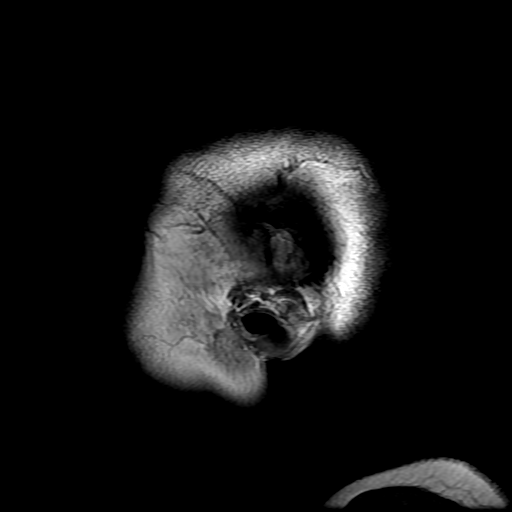

[Series 7: T2 · axial · 5.0mm · 0.43mm/px · z∈[-27,+117]mm · 3 of 25 slices shown]
[im 1/25]
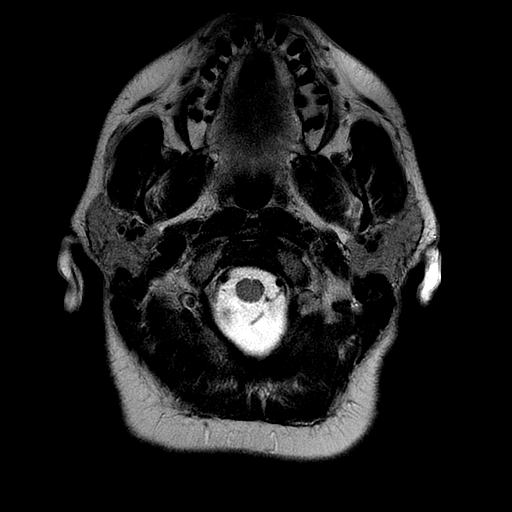
[im 13/25]
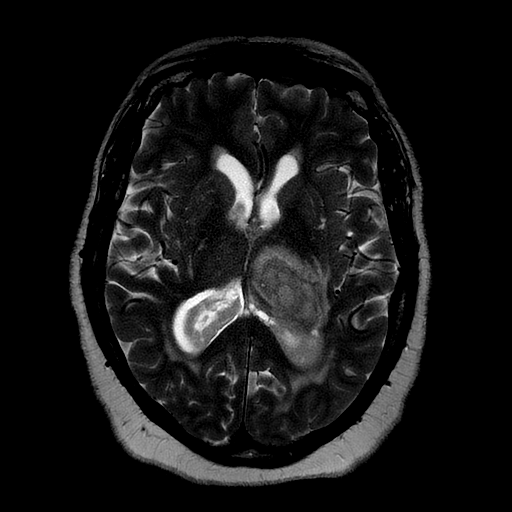
[im 25/25]
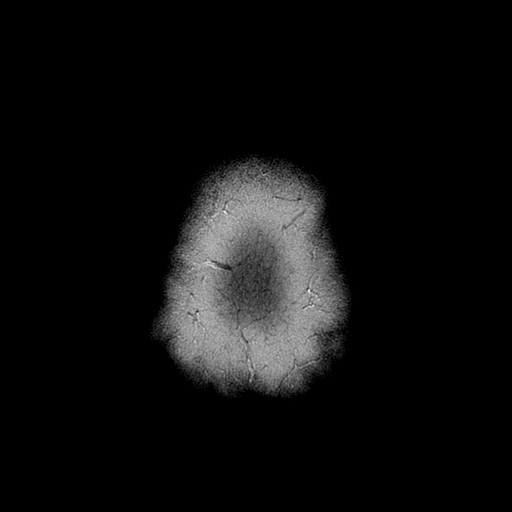

[Series 8: FLAIR · axial · 3.0mm · 0.43mm/px · z∈[-27,+117]mm · 3 of 25 slices shown]
[im 1/25]
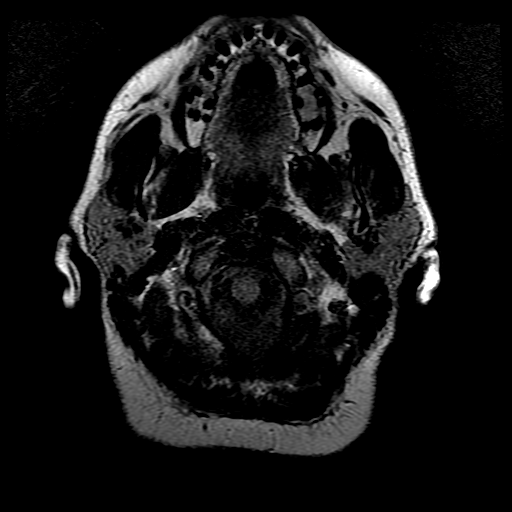
[im 13/25]
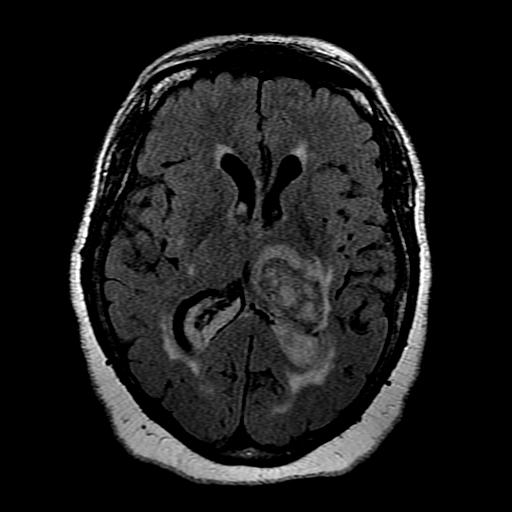
[im 25/25]
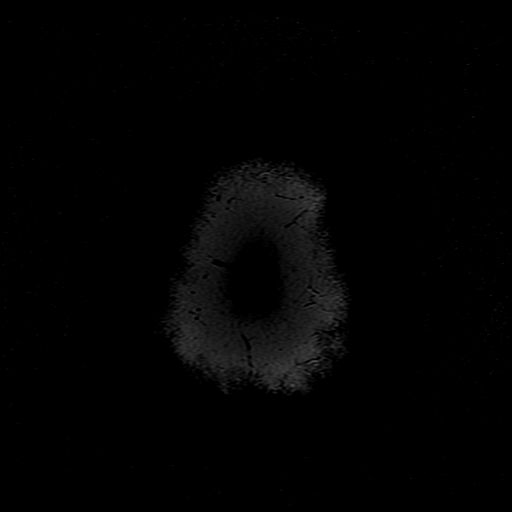

[Series 300: DWI · axial · 3.0mm · 1.09mm/px · z∈[-28,+110]mm · 5 of 47 slices shown (3 of 3)]
[im 1/47]
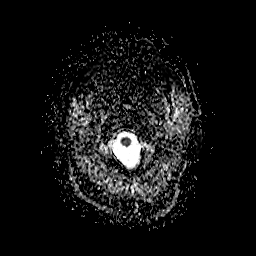
[im 12/47]
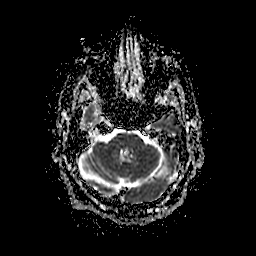
[im 24/47]
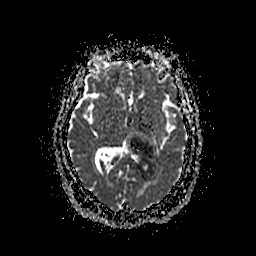
[im 35/47]
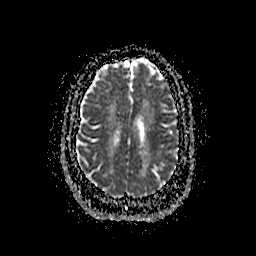
[im 47/47]
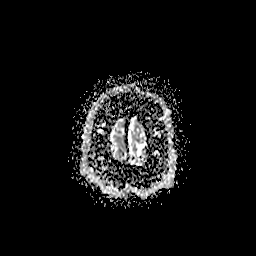

[33 of 48 positions shown; findings below may reference images not displayed]

FINDINGS: MRI HEAD

Brain: Left thalamic hemorrhage is again identified similar to the
recent CT with acute blood products. There is mild surrounding
edema. Significant intraventricular extension is present as seen on
CT with resulting hydrocephalus.

Focus susceptibility in the right corona radiata reflects chronic
microhemorrhage associated with a lacunar infarct. Additional patchy
and confluent areas of T2 hyperintensity in the supratentorial white
matter are nonspecific but probably reflect moderate chronic
microvascular ischemic changes.

No post contrast imaging performed therefore potential underlying
lesion is not well evaluated.

Vascular: Major vessel flow voids at the skull base are preserved.

Skull and upper cervical spine: Normal marrow signal is preserved.

Sinuses/Orbits: Paranasal sinuses are aerated. Orbits are
unremarkable.

Other: Sella is unremarkable.  Mastoid air cells are clear.

MRA HEAD

Intracranial internal carotid arteries are patent. Middle and
anterior cerebral arteries are patent. Intracranial vertebral
arteries, basilar artery, posterior cerebral arteries are patent.
There is no significant stenosis or aneurysm. No abnormal flow
related enhancement in the region of thalamic hemorrhage.
IMPRESSION: No substantial change in left thalamic hemorrhage with significant
intraventricular extension and hydrocephalus as well as mild edema
and mass effect compared to recent CT.

Chronic small vessel infarct of the right corona radiata with
chronic blood products.

Moderate chronic microvascular ischemic changes.

Unremarkable MRA.

## 2021-05-14 IMAGING — DX DG ABDOMEN 1V
1 series · 1 of 1 positions shown · non-contrast
Comparison: Portable pelvis radiograph today reported separately.

CLINICAL DATA: 60-year-old female with intracranial hemorrhage.

EXAM:
ABDOMEN - 1 VIEW

[abdomen]
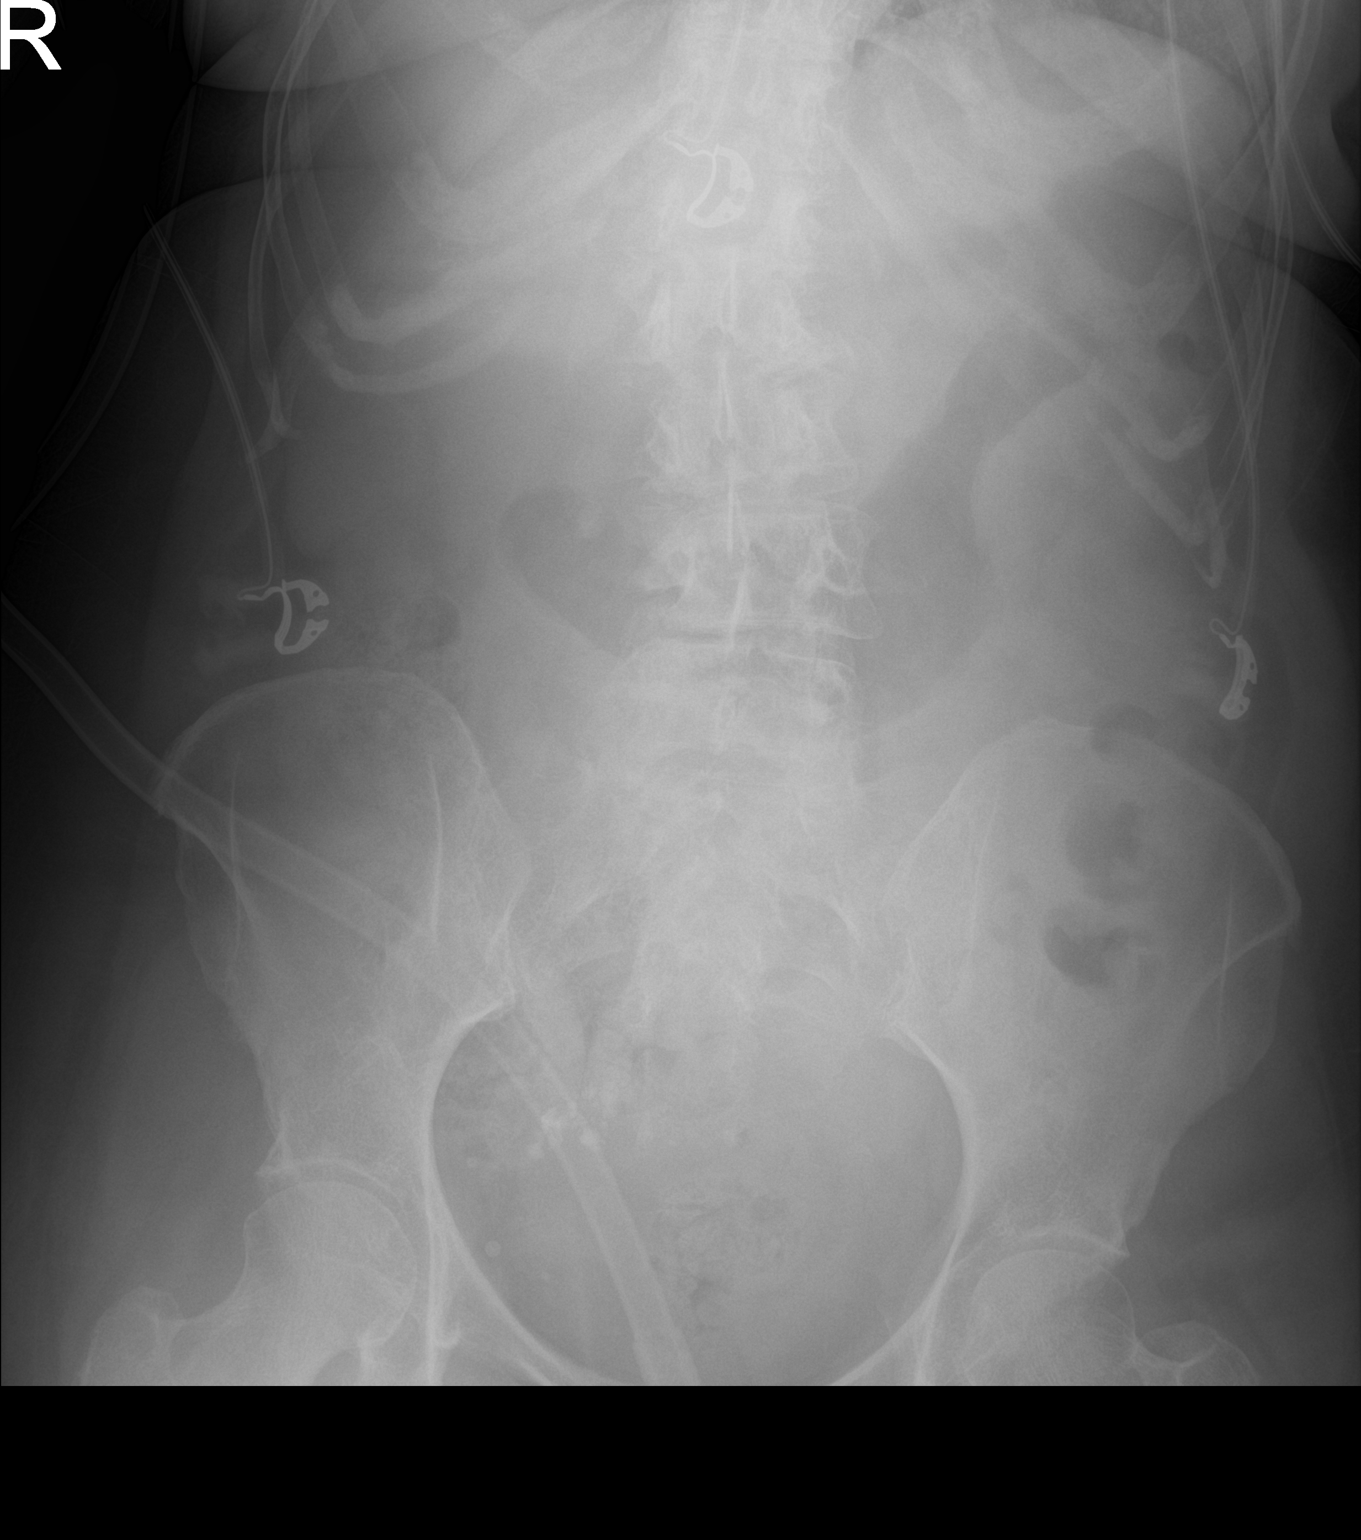

[1 of 1 positions shown; findings below may reference images not displayed]

FINDINGS: Portable AP supine view at [IG] hours. Non obstructed bowel gas
pattern. No definite pneumoperitoneum on this supine view. Minimally
included left lung base appears grossly negative. Scoliosis and
degenerative changes in the spine. Pelvic dystrophic and/or vascular
calcifications, mild. No acute osseous abnormality identified.
IMPRESSION: 1. Normal bowel gas pattern.
2. Spinal scoliosis and degeneration.

## 2021-05-14 IMAGING — CT CT HEAD W/O CM
4 series · 16 of 47 positions shown, 18 images · non-contrast
Comparison: None. I am not able to view the outside institution
scans.

CLINICAL DATA: Intraparenchymal hemorrhage. Follow-up of scans
performed at [HOSPITAL], [HOSPITAL].

EXAM:
CT HEAD WITHOUT CONTRAST
TECHNIQUE: Contiguous axial images were obtained from the base of the skull
through the vertex without intravenous contrast.

[Series 3: head without · axial · non-contrast · 0.49mm/px · z∈[-144,-14]mm · 7 of 36 slices shown, 9 images]
[im 5/36  brain]
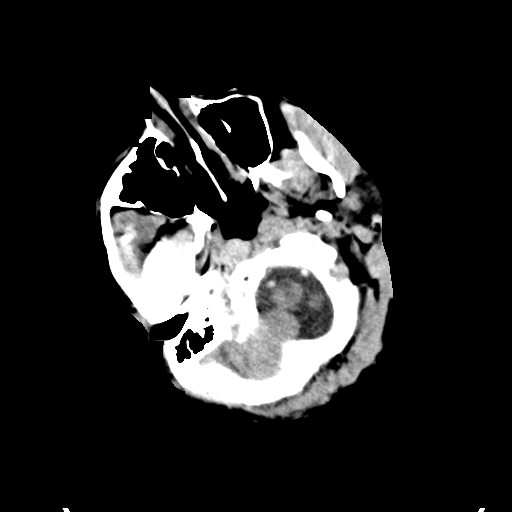
[im 5/36  bone]
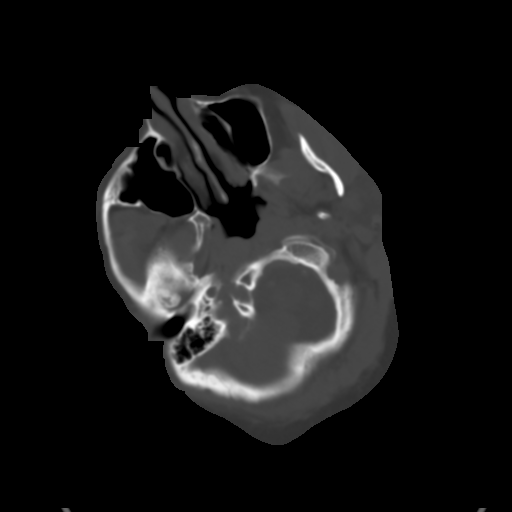
[im 9/36  brain]
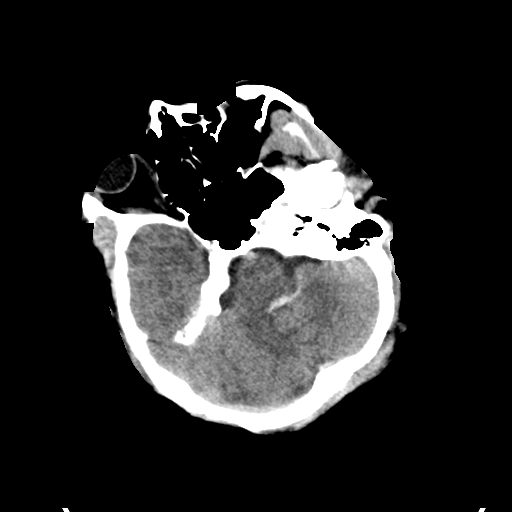
[im 14/36  brain]
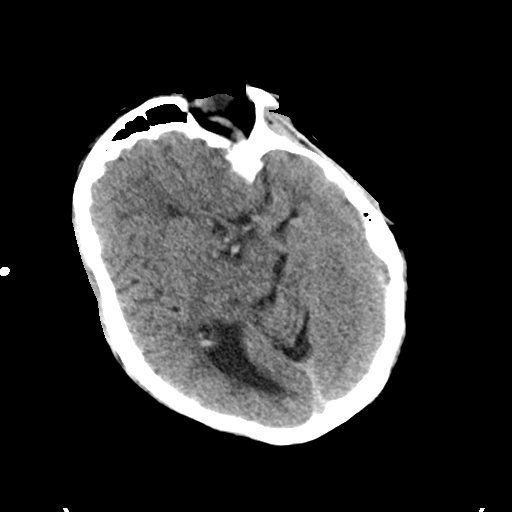
[im 18/36  brain]
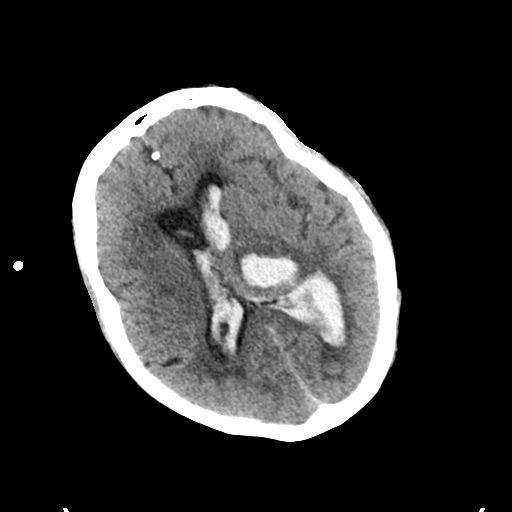
[im 22/36  brain]
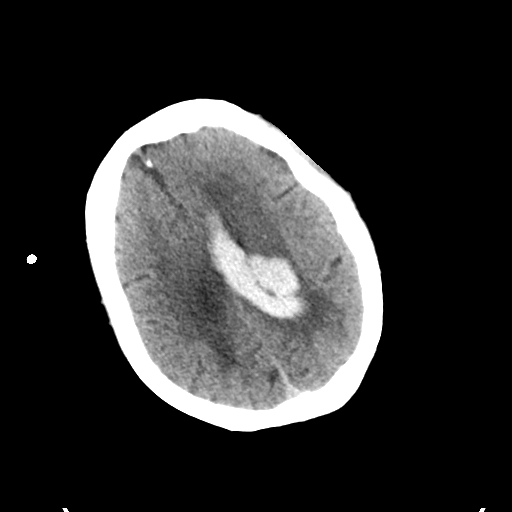
[im 22/36  bone]
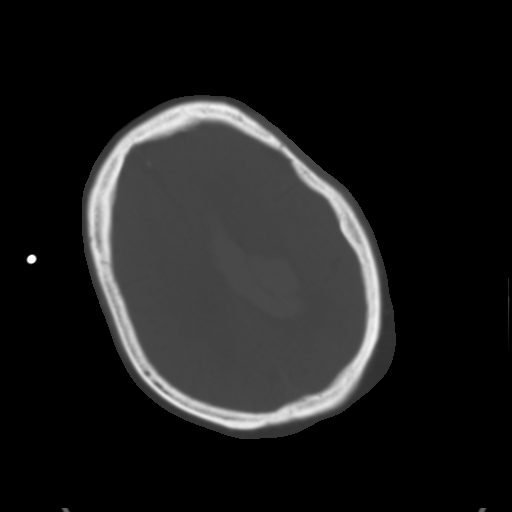
[im 27/36  brain]
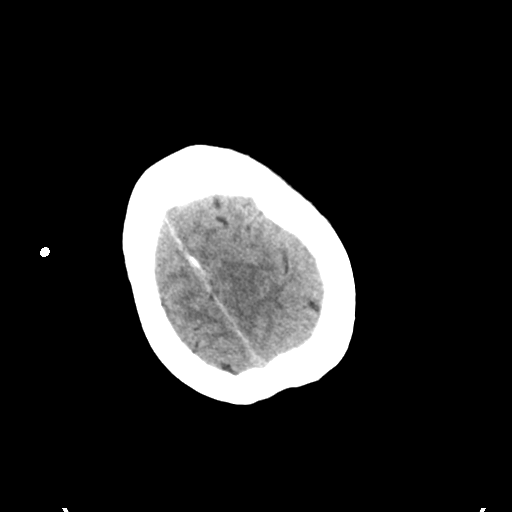
[im 31/36  brain]
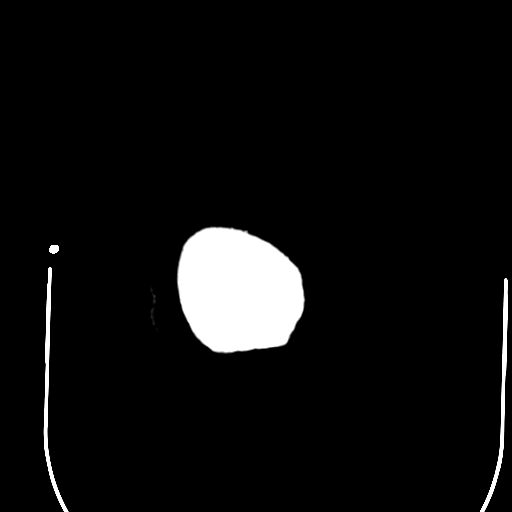

[Series 4: head bone · axial · 0.49mm/px · z∈[-148,-112]mm · 3 of 88 slices shown]
[im 9/88  bone]
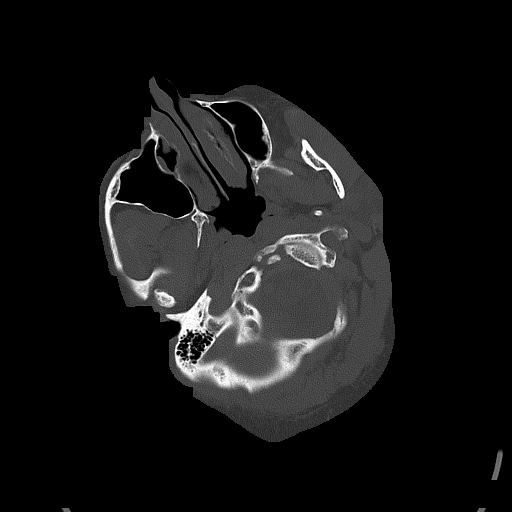
[im 18/88  bone]
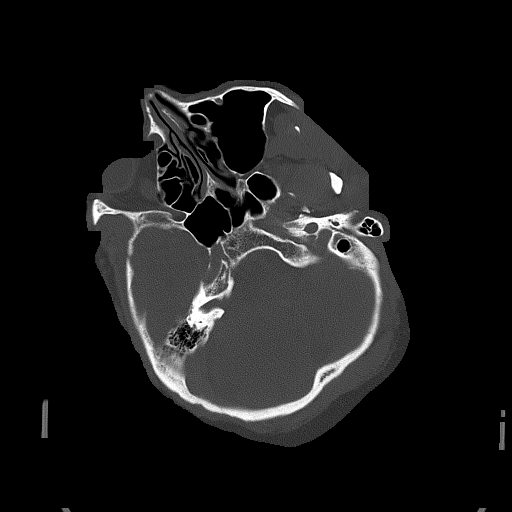
[im 27/88  bone]
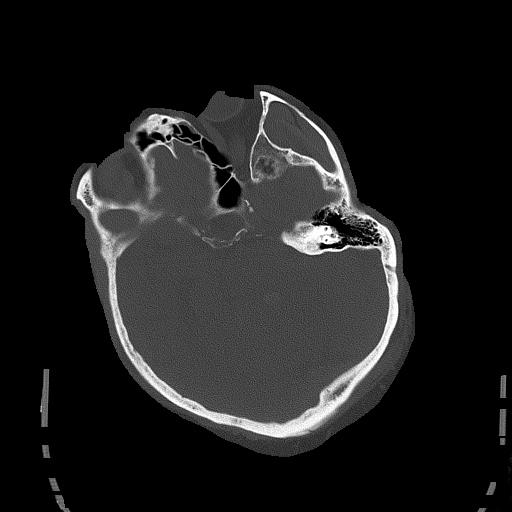

[Series 5: head without cor · coronal · non-contrast · 0.35mm/px · 3 of 67 slices shown]
[im 23/67  brain]
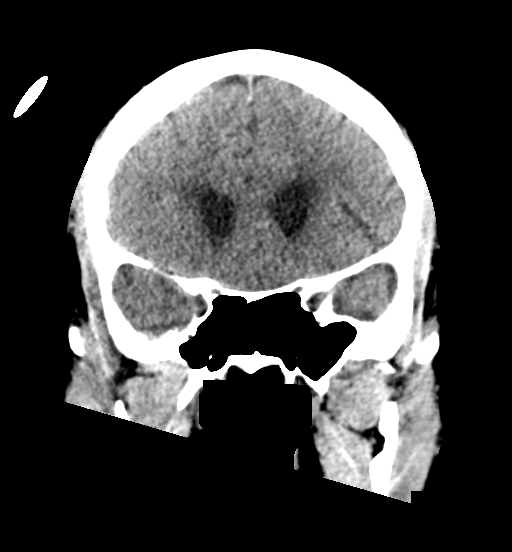
[im 30/67  brain]
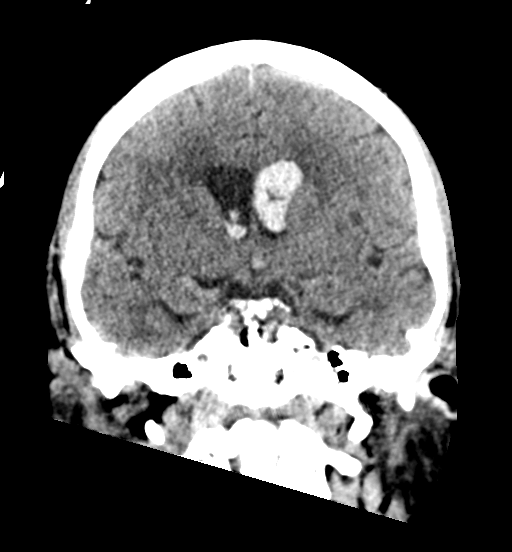
[im 37/67  brain]
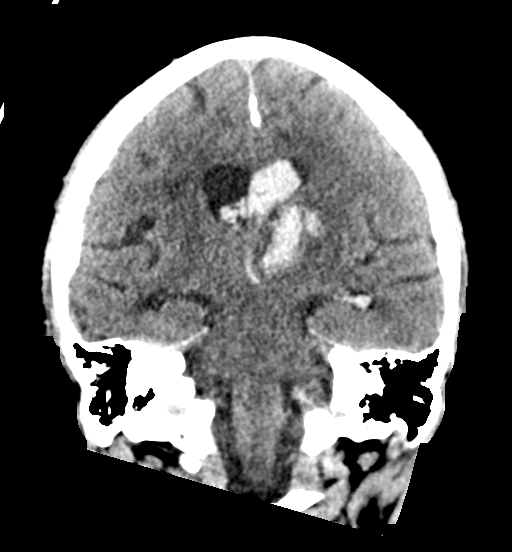

[Series 6: head without sag · sagittal · non-contrast · 0.37mm/px · 3 of 67 slices shown]
[im 30/67  brain]
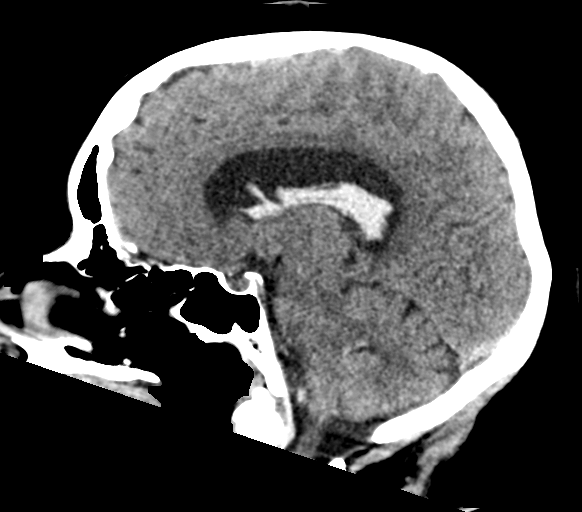
[im 34/67  brain]
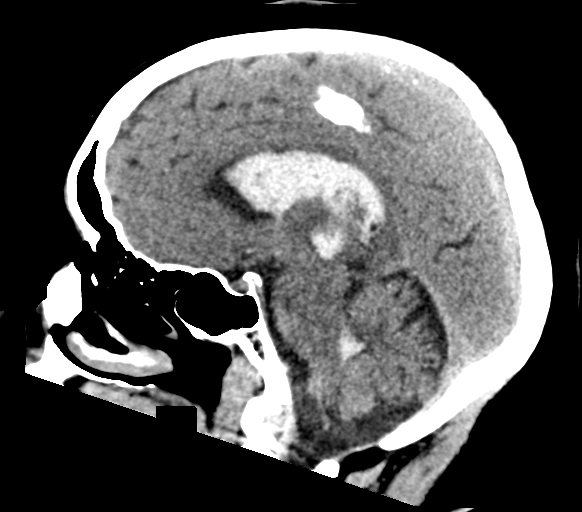
[im 37/67  brain]
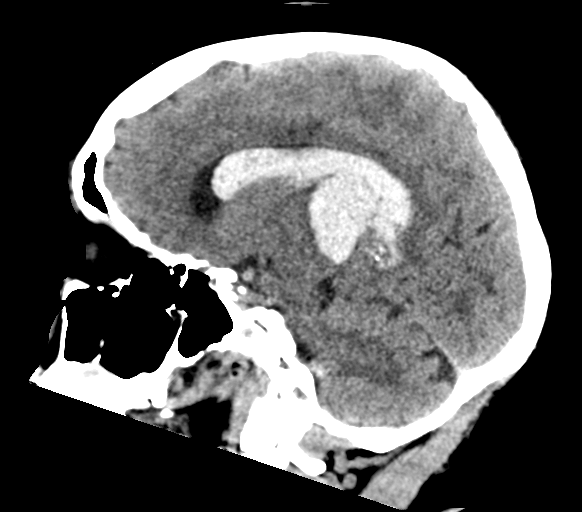

[16 of 47 positions shown; findings below may reference images not displayed]

FINDINGS: Brain: Intraparenchymal hematoma centered in the left thalamus
measures 3.2 x 2.2 x 3.0 cm (volume = 11 cm^3). There is moderate
hydrocephalus with periventricular hypoattenuation suggesting
transependymal interstitial edema. There is rightward bulging of the
septum pellucidum. Large amount of blood in the ventricles.

Vascular: No abnormal hyperdensity of the major intracranial
arteries or dural venous sinuses. No intracranial atherosclerosis.

Skull: The visualized skull base, calvarium and extracranial soft
tissues are normal.

Sinuses/Orbits: No fluid levels or advanced mucosal thickening of
the visualized paranasal sinuses. No mastoid or middle ear effusion.
The orbits are normal.
IMPRESSION: 1. Intraparenchymal hematoma centered in the left thalamus with
intraventricular extension and moderate hydrocephalus with
transependymal interstitial edema.

Critical Value/emergent results were called by telephone at the time
of interpretation on [DATE] at [DATE] to provider BYEONG-CHEOL ,
who verbally acknowledged these results.

## 2021-05-14 IMAGING — DX DG CHEST 1V PORT
1 series · 1 of 1 positions shown · non-contrast
Comparison: None.

CLINICAL DATA: Provided history: Follow-up.

EXAM:
PORTABLE CHEST 1 VIEW

[chest]
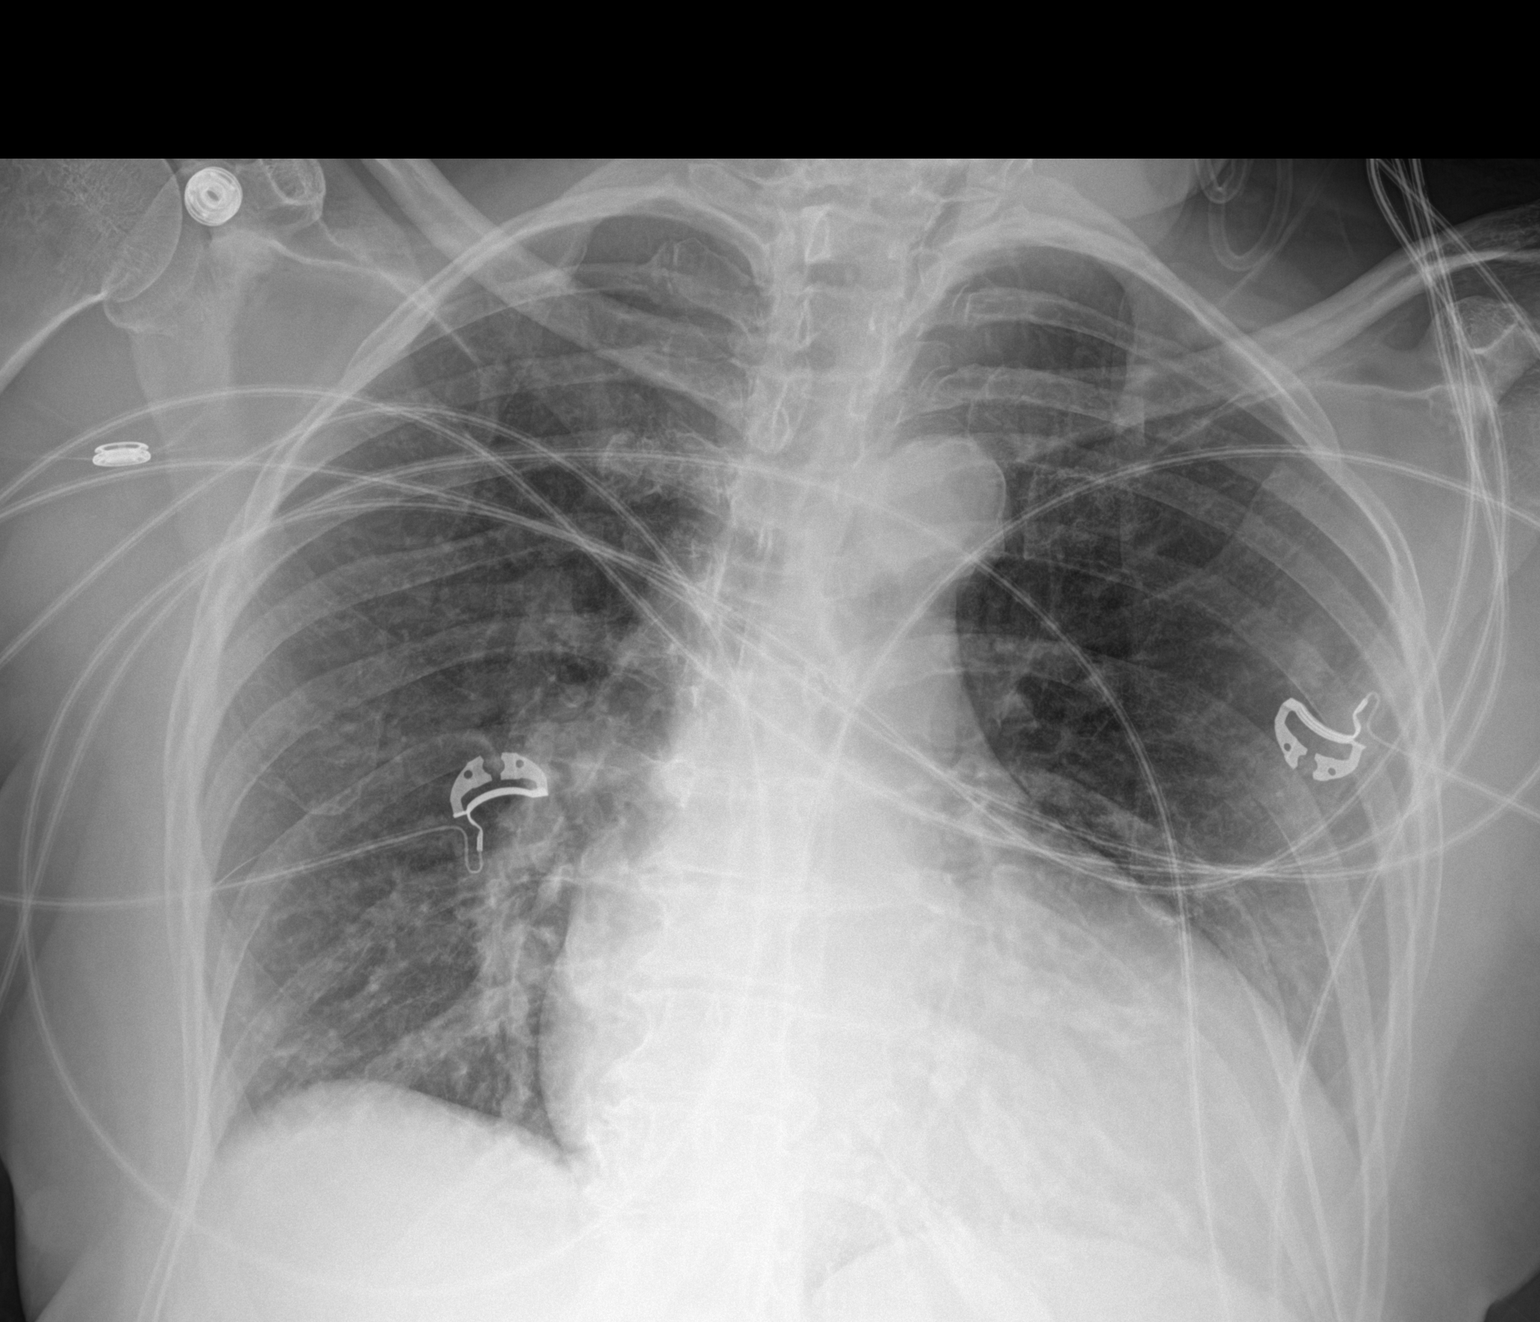

[1 of 1 positions shown; findings below may reference images not displayed]

FINDINGS: Cardiomediastinal silhouette is enlarged, likely accentuated by AP
technique. Calcific atherosclerosis of the aorta. Low lung volumes.
No consolidation. EKG leads bilaterally. No visible pleural
effusions or pneumothorax on this single semi erect AP radiograph.
IMPRESSION: 1. Low lung volumes without evidence of acute cardiopulmonary
disease.
2. Cardiomegaly.

## 2021-05-14 IMAGING — DX DG PORTABLE PELVIS
1 series · 1 of 1 positions shown · non-contrast
Comparison: None.

CLINICAL DATA: 60-year-old female with intracranial hemorrhage.

EXAM:
PORTABLE PELVIS 1-2 VIEWS

[pelvis]
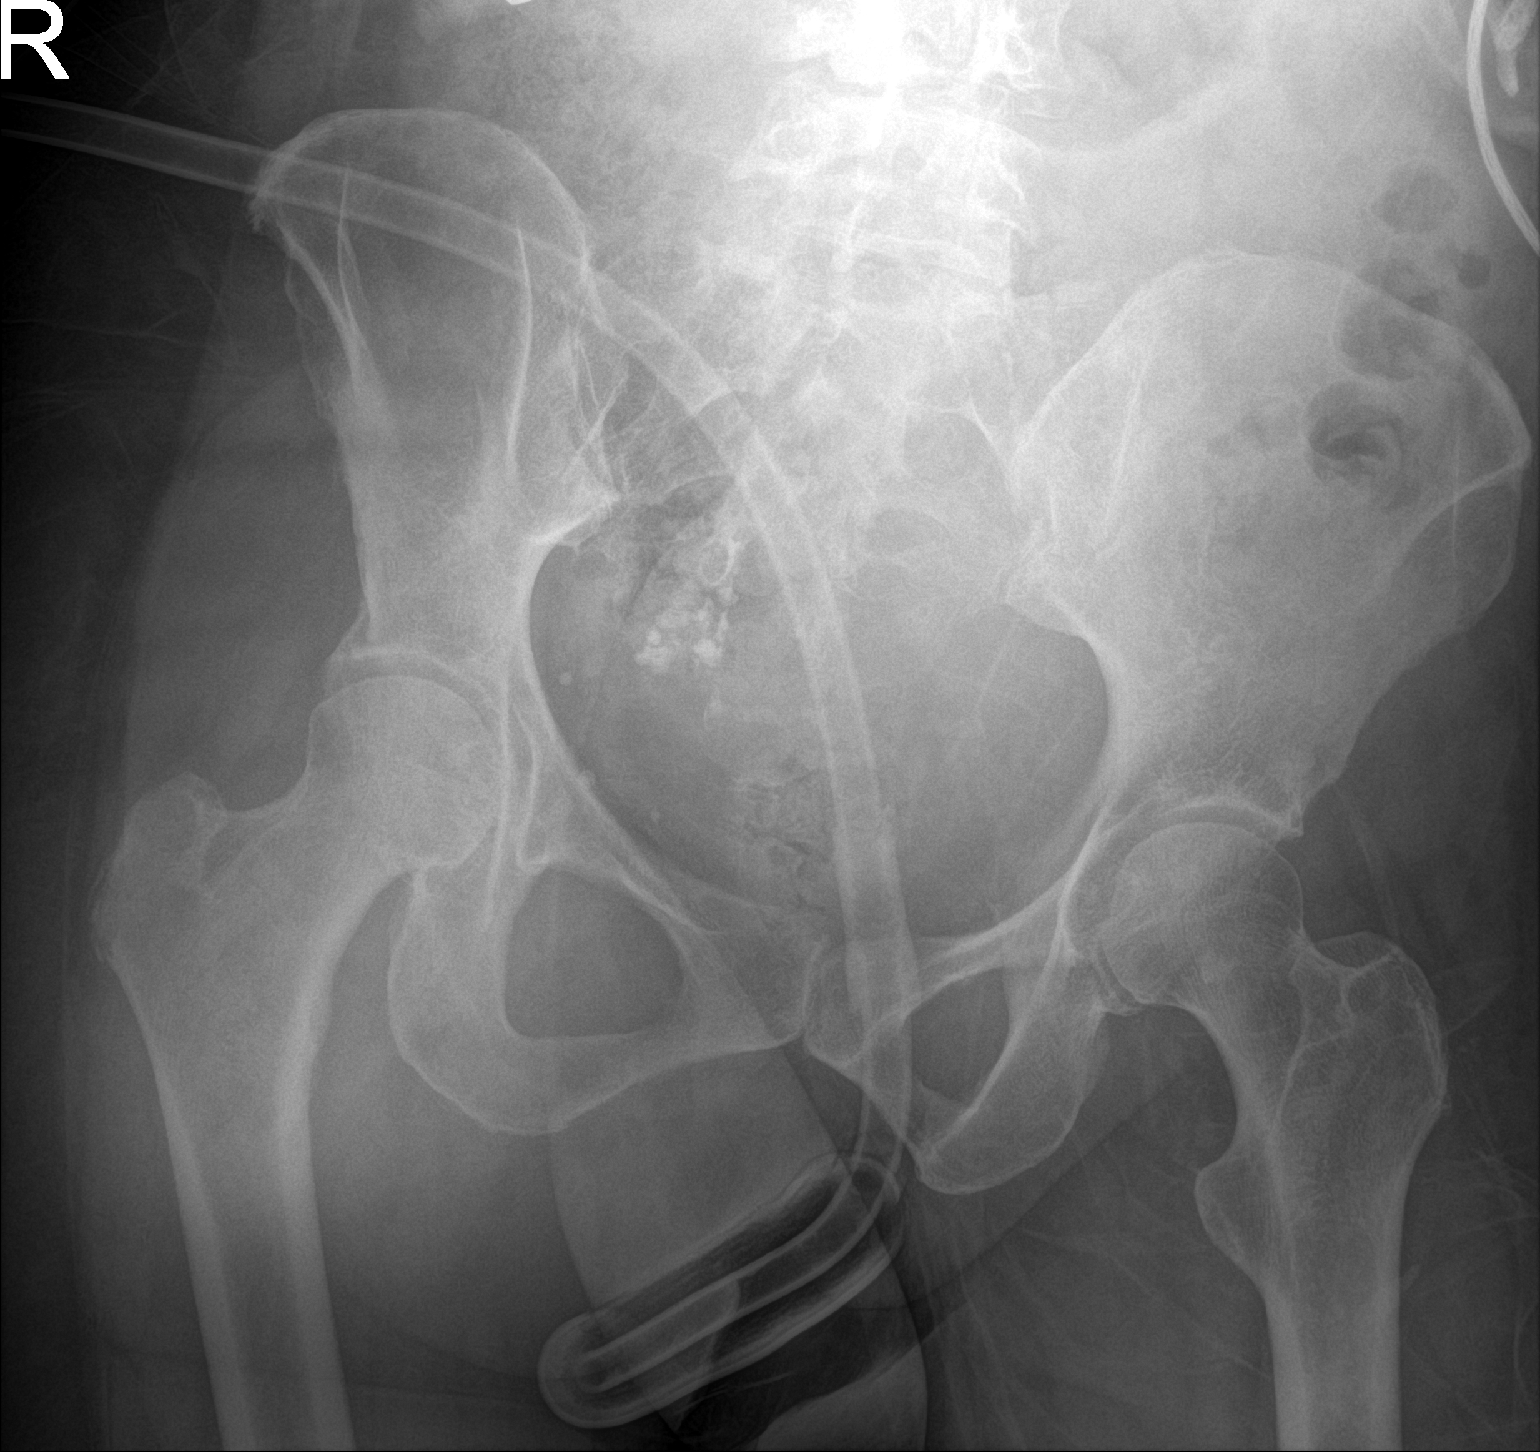

[1 of 1 positions shown; findings below may reference images not displayed]

FINDINGS: Portable AP view at [BL] hours. Mild likely fibroid associated
dystrophic calcifications in the pelvis eccentric to the right.
Femoral heads normally located. Pelvis and proximal femurs appear
intact. No acute osseous abnormality identified. Negative visible
bowel gas pattern.
IMPRESSION: Negative.

## 2021-05-14 IMAGING — MR MR HEAD W/O CM
6 of 8 series · 29 of 48 positions shown · non-contrast
Comparison: No pertinent prior exam.
COMPARISON: Correlation made with prior head CT

CLINICAL DATA: Intracranial hemorrhage

EXAM:
MRI HEAD WITHOUT CONTRAST
MRA HEAD WITHOUT CONTRAST
TECHNIQUE: Multiplanar, multi-echo pulse sequences of the brain and surrounding
structures were acquired without intravenous contrast. Angiographic
images of the Circle of Willis were acquired using MRA technique
without intravenous contrast.

[Series 5: mag_images · axial · 3.0mm · 0.90mm/px · z∈[-126,+50]mm · 6 of 60 slices shown]
[im 1/60]
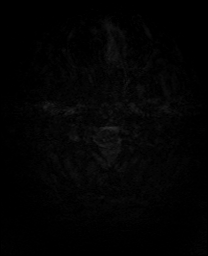
[im 12/60]
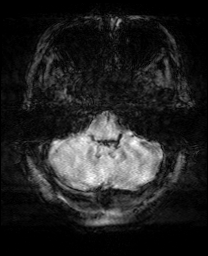
[im 24/60]
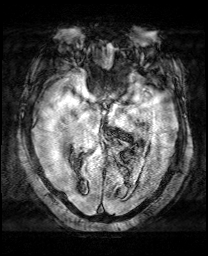
[im 36/60]
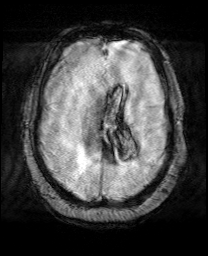
[im 48/60]
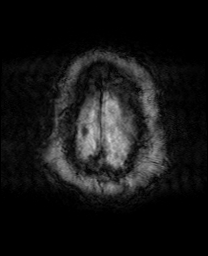
[im 60/60]
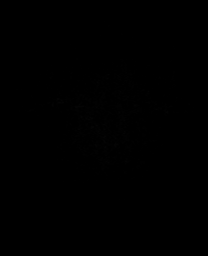

[Series 6: pha_images · axial · 3.0mm · 0.90mm/px · z∈[-126,+38]mm · 6 of 56 slices shown]
[im 1/56]
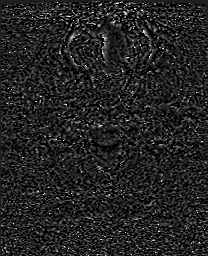
[im 12/56]
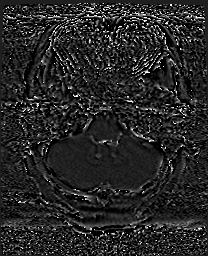
[im 23/56]
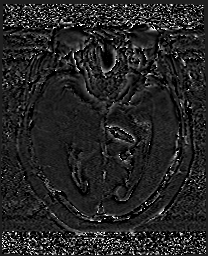
[im 34/56]
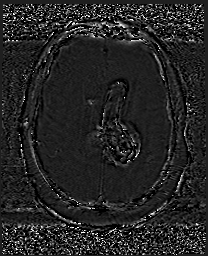
[im 45/56]
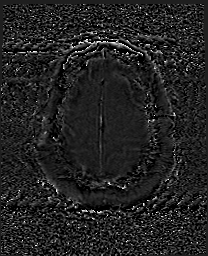
[im 56/56]
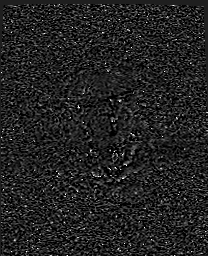

[Series 7: swi_images · axial · 3.0mm · 0.90mm/px · z∈[-126,+50]mm · 6 of 60 slices shown]
[im 1/60]
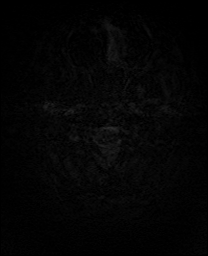
[im 12/60]
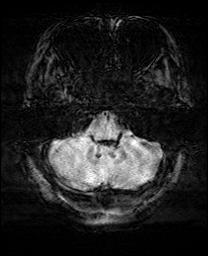
[im 24/60]
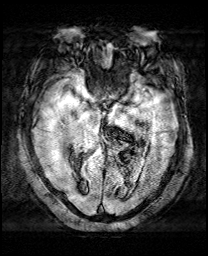
[im 36/60]
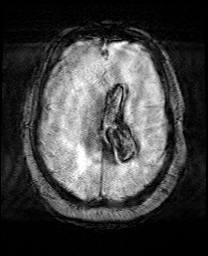
[im 48/60]
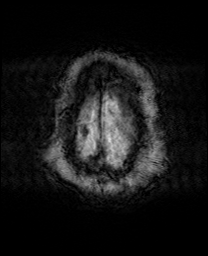
[im 60/60]
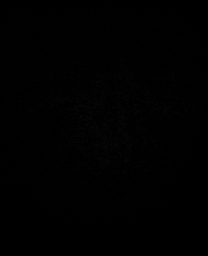

[Series 8: mip_images(sw) · axial · 24.0mm · 0.90mm/px · z∈[-115,+40]mm · 5 of 53 slices shown]
[im 1/53]
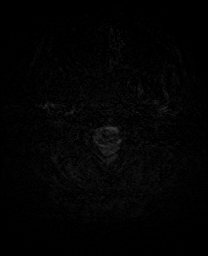
[im 14/53]
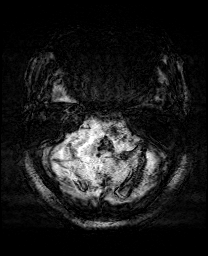
[im 27/53]
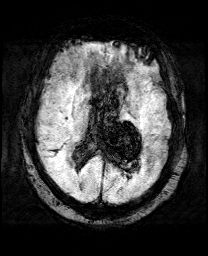
[im 40/53]
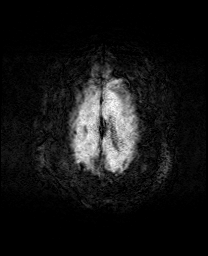
[im 53/53]
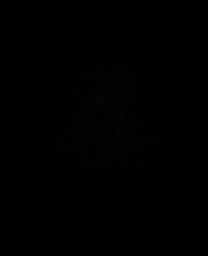

[Series 10: T2 · coronal · 5.0mm · 0.34mm/px · 3 of 29 slices shown]
[im 1/29]
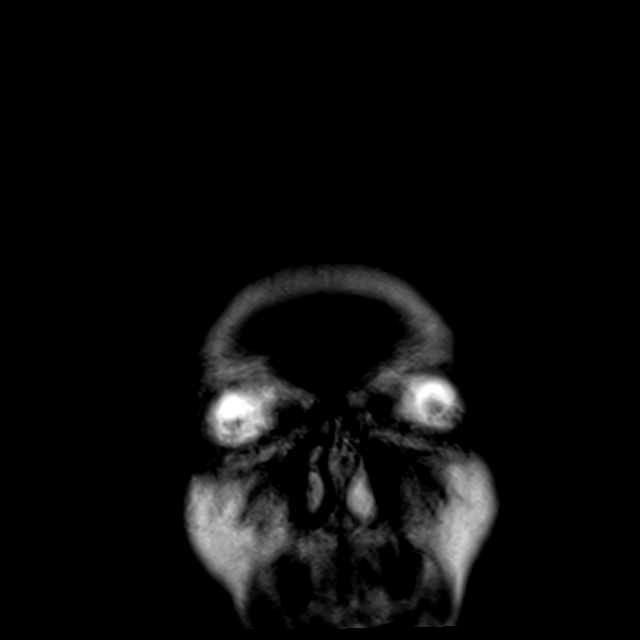
[im 15/29]
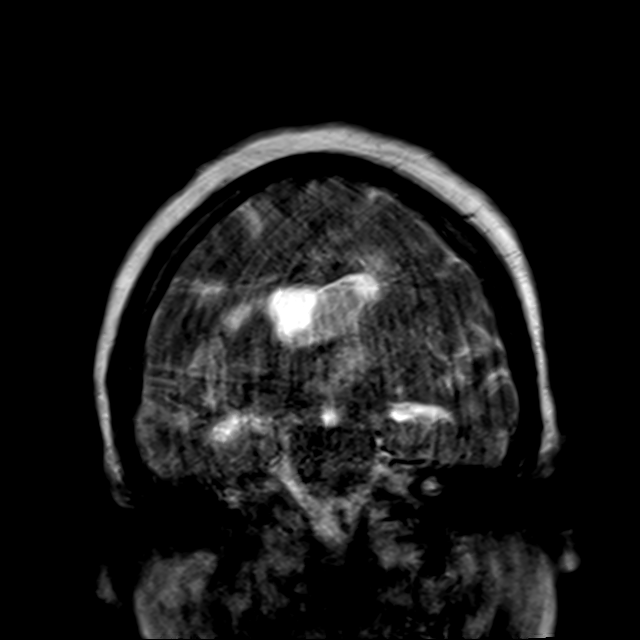
[im 29/29]
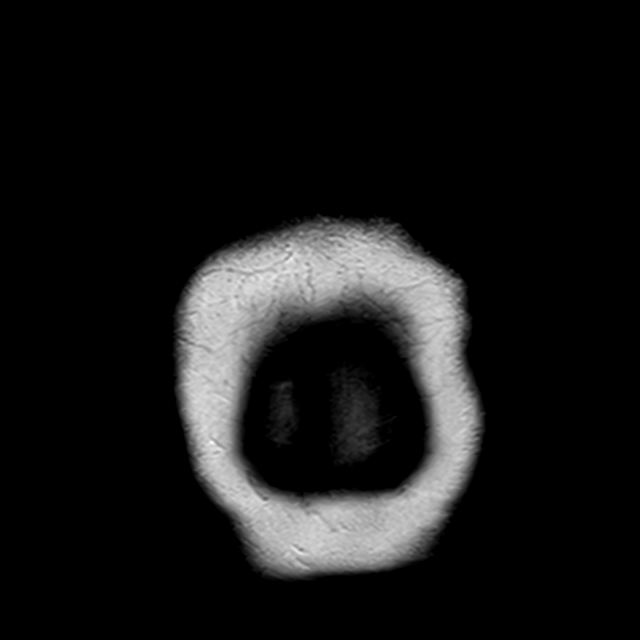

[Series 11: T2 post-contrast · coronal · 5.0mm · 0.72mm/px · 3 of 28 slices shown]
[im 1/28]
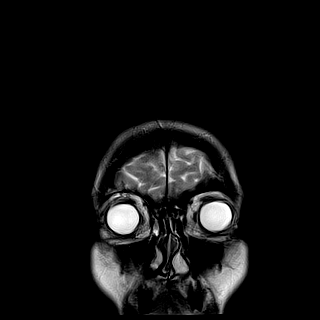
[im 14/28]
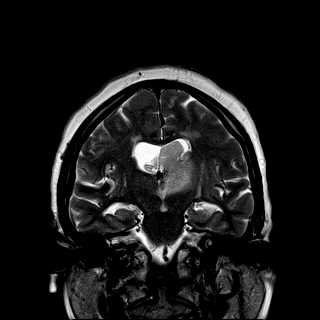
[im 28/28]
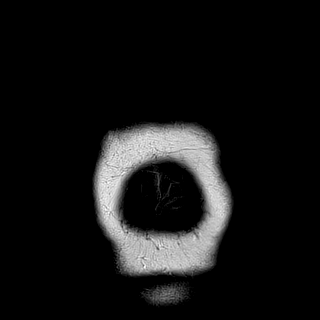

[29 of 48 positions shown; findings below may reference images not displayed]

FINDINGS: MRI HEAD

Brain: Left thalamic hemorrhage is again identified similar to the
recent CT with acute blood products. There is mild surrounding
edema. Significant intraventricular extension is present as seen on
CT with resulting hydrocephalus.

Focus susceptibility in the right corona radiata reflects chronic
microhemorrhage associated with a lacunar infarct. Additional patchy
and confluent areas of T2 hyperintensity in the supratentorial white
matter are nonspecific but probably reflect moderate chronic
microvascular ischemic changes.

No post contrast imaging performed therefore potential underlying
lesion is not well evaluated.

Vascular: Major vessel flow voids at the skull base are preserved.

Skull and upper cervical spine: Normal marrow signal is preserved.

Sinuses/Orbits: Paranasal sinuses are aerated. Orbits are
unremarkable.

Other: Sella is unremarkable.  Mastoid air cells are clear.

MRA HEAD

Intracranial internal carotid arteries are patent. Middle and
anterior cerebral arteries are patent. Intracranial vertebral
arteries, basilar artery, posterior cerebral arteries are patent.
There is no significant stenosis or aneurysm. No abnormal flow
related enhancement in the region of thalamic hemorrhage.
IMPRESSION: No substantial change in left thalamic hemorrhage with significant
intraventricular extension and hydrocephalus as well as mild edema
and mass effect compared to recent CT.

Chronic small vessel infarct of the right corona radiata with
chronic blood products.

Moderate chronic microvascular ischemic changes.

Unremarkable MRA.

## 2021-05-14 MED ORDER — FENTANYL CITRATE (PF) 100 MCG/2ML IJ SOLN
INTRAMUSCULAR | Status: AC
  Administered 2021-05-14 (×2): 25 ug
  Filled 2021-05-14: qty 2

## 2021-05-14 MED ORDER — ACETAMINOPHEN 325 MG PO TABS
650.0000 mg | ORAL_TABLET | ORAL | Status: DC | PRN
  Filled 2021-05-14: qty 2

## 2021-05-14 MED ORDER — STROKE: EARLY STAGES OF RECOVERY BOOK
Freq: Once | Status: AC
  Filled 2021-05-14: qty 1

## 2021-05-14 MED ORDER — SENNOSIDES-DOCUSATE SODIUM 8.6-50 MG PO TABS
1.0000 | ORAL_TABLET | Freq: Two times a day (BID) | ORAL | Status: DC
  Filled 2021-05-14 (×2): qty 1

## 2021-05-14 MED ORDER — CLEVIDIPINE BUTYRATE 0.5 MG/ML IV EMUL
0.0000 mg/h | INTRAVENOUS | Status: DC
  Administered 2021-05-14: 18 mg/h via INTRAVENOUS
  Administered 2021-05-14: 26 mg/h via INTRAVENOUS
  Administered 2021-05-14: 28 mg/h via INTRAVENOUS
  Administered 2021-05-14: 30 mg/h via INTRAVENOUS
  Administered 2021-05-14 (×2): 28 mg/h via INTRAVENOUS
  Administered 2021-05-14: 8 mg/h via INTRAVENOUS
  Administered 2021-05-14 (×2): 30 mg/h via INTRAVENOUS
  Administered 2021-05-14: 29 mg/h via INTRAVENOUS
  Administered 2021-05-14: 28 mg/h via INTRAVENOUS
  Administered 2021-05-14: 19 mg/h via INTRAVENOUS
  Administered 2021-05-14: 28 mg/h via INTRAVENOUS
  Administered 2021-05-15: 20 mg/h via INTRAVENOUS
  Administered 2021-05-15 (×3): 30 mg/h via INTRAVENOUS
  Administered 2021-05-15: 26 mg/h via INTRAVENOUS
  Administered 2021-05-15: 24 mg/h via INTRAVENOUS
  Administered 2021-05-15: 30 mg/h via INTRAVENOUS
  Administered 2021-05-15: 14 mg/h via INTRAVENOUS
  Administered 2021-05-15 (×2): 30 mg/h via INTRAVENOUS
  Administered 2021-05-16: 28 mg/h via INTRAVENOUS
  Administered 2021-05-16: 30 mg/h via INTRAVENOUS
  Administered 2021-05-16 (×2): 26 mg/h via INTRAVENOUS
  Administered 2021-05-16: 30 mg/h via INTRAVENOUS
  Administered 2021-05-16: 28 mg/h via INTRAVENOUS
  Administered 2021-05-16: 26 mg/h via INTRAVENOUS
  Administered 2021-05-16: 28 mg/h via INTRAVENOUS
  Administered 2021-05-16: 22 mg/h via INTRAVENOUS
  Administered 2021-05-16: 25 mg/h via INTRAVENOUS
  Administered 2021-05-16: 21 mg/h via INTRAVENOUS
  Administered 2021-05-17: 8 mg/h via INTRAVENOUS
  Administered 2021-05-17: 18 mg/h via INTRAVENOUS
  Administered 2021-05-17: 15 mg/h via INTRAVENOUS
  Administered 2021-05-18: 16 mg/h via INTRAVENOUS
  Administered 2021-05-18: 2 mg/h via INTRAVENOUS
  Administered 2021-05-19: 28 mg/h via INTRAVENOUS
  Administered 2021-05-19 (×2): 21 mg/h via INTRAVENOUS
  Administered 2021-05-19: 9 mg/h via INTRAVENOUS
  Administered 2021-05-19: 17 mg/h via INTRAVENOUS
  Administered 2021-05-19: 21 mg/h via INTRAVENOUS
  Administered 2021-05-20: 13 mg/h via INTRAVENOUS
  Administered 2021-05-20: 18 mg/h via INTRAVENOUS
  Administered 2021-05-20: 9 mg/h via INTRAVENOUS
  Filled 2021-05-14: qty 200
  Filled 2021-05-14 (×2): qty 100
  Filled 2021-05-14: qty 200
  Filled 2021-05-14 (×16): qty 100
  Filled 2021-05-14: qty 50
  Filled 2021-05-14 (×5): qty 100
  Filled 2021-05-14: qty 200
  Filled 2021-05-14: qty 50
  Filled 2021-05-14 (×10): qty 100
  Filled 2021-05-14: qty 200
  Filled 2021-05-14: qty 100
  Filled 2021-05-14: qty 200
  Filled 2021-05-14 (×4): qty 100
  Filled 2021-05-14: qty 200

## 2021-05-14 MED ORDER — CHLORHEXIDINE GLUCONATE CLOTH 2 % EX PADS
6.0000 | MEDICATED_PAD | Freq: Every day | CUTANEOUS | Status: DC
  Administered 2021-05-14 – 2021-05-30 (×11): 6 via TOPICAL

## 2021-05-14 MED ORDER — ACETAMINOPHEN 160 MG/5ML PO SOLN
650.0000 mg | ORAL | Status: DC | PRN
  Administered 2021-05-16 – 2021-05-24 (×15): 650 mg
  Filled 2021-05-14 (×15): qty 20.3

## 2021-05-14 MED ORDER — LEVETIRACETAM IN NACL 500 MG/100ML IV SOLN
500.0000 mg | Freq: Two times a day (BID) | INTRAVENOUS | Status: DC
  Administered 2021-05-14 – 2021-05-17 (×6): 500 mg via INTRAVENOUS
  Filled 2021-05-14 (×6): qty 100

## 2021-05-14 MED ORDER — FENTANYL CITRATE (PF) 100 MCG/2ML IJ SOLN: 25.0000 ug | Freq: Once | INTRAMUSCULAR | Status: DC

## 2021-05-14 MED ORDER — PANTOPRAZOLE SODIUM 40 MG IV SOLR
40.0000 mg | Freq: Every day | INTRAVENOUS | Status: DC
  Administered 2021-05-14 – 2021-05-16 (×3): 40 mg via INTRAVENOUS
  Filled 2021-05-14 (×3): qty 40

## 2021-05-14 MED ORDER — LISINOPRIL 20 MG PO TABS
20.0000 mg | ORAL_TABLET | Freq: Every day | ORAL | Status: DC
  Filled 2021-05-14: qty 1

## 2021-05-14 MED ORDER — ACETAMINOPHEN 650 MG RE SUPP
650.0000 mg | RECTAL | Status: DC | PRN
  Administered 2021-05-14 – 2021-05-15 (×2): 650 mg via RECTAL
  Filled 2021-05-14 (×2): qty 1

## 2021-05-14 NOTE — Evaluation (Signed)
Speech Language Pathology Evaluation Patient Details Name: Marissa Morris MRN: [id number] DOB: 08/29/1960 Today's Date: 05/14/2021 Time: 6761-9509 SLP Time Calculation (min) (ACUTE ONLY): 15 min  Problem List:  Patient Active Problem List   Diagnosis Date Noted  . ICH (intracerebral hemorrhage) (HCC) 05/14/2021   Past Medical History: History reviewed. No pertinent past medical history. Past Surgical History: History reviewed. No pertinent surgical history. HPI:  Marissa Morris is a 60 y.o. female presenting to [facility] in transfer from [facility] for management of acute intracranial hemorrhage. 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 Moderate chronic microvascular ischemic changes. No PMH on file.   Assessment / Plan / Recommendation Clinical Impression  Pt was sleepy and able to remain awake for >10 second intervals. Exhibits global aphasia and receptive with verbal output less than her receptive ability during assessment. Able to track therapist to her right affected side and unable to maintain. Responded to questions with head nods/shake or verbal "uh huh" with yes/no approx 50% accuracy. Followed one step commands accurately half of the time with perseverations noted. Verbal/visual (writtent)/tacitle assist to count in unison needing repeated trials and 80% accuracy; days of the week visualizing word with 40%. Answered y/n questions with visualization of calendar with 80% acc. Diagnostic assessment of cognition will be ongoing during therapeutic session and when more alert. Suspect awareness, problem solving are reduced. Sustained attention is suboptimal.    SLP Assessment  SLP Recommendation/Assessment: Patient needs continued Speech Lanaguage Pathology  Services SLP Visit Diagnosis: Aphasia (R47.01);Cognitive communication deficit (R41.841)    Follow Up Recommendations  Inpatient Rehab    Frequency and Duration min 2x/week  2 weeks      SLP Evaluation Cognition  Overall Cognitive Status: Impaired/Different from baseline Arousal/Alertness: Lethargic Orientation Level: Oriented to person;Disoriented to time Attention: Sustained Sustained Attention: Impaired Sustained Attention Impairment: Functional basic;Verbal basic Memory:  (TBA) Awareness: Impaired Awareness Impairment: Emergent impairment Problem Solving:  (will assess further) Behaviors: Perseveration Safety/Judgment: Impaired       Comprehension  Auditory Comprehension Overall Auditory Comprehension: Impaired Yes/No Questions: Impaired Basic Biographical Questions: 51-75% accurate (75%) Basic Immediate Environment Questions: 50-74% accurate Commands: Impaired One Step Basic Commands: 50-74% accurate (50%) Interfering Components: Attention Visual Recognition/Discrimination Discrimination: Not tested Reading Comprehension Reading Status: Not tested    Expression Expression Primary Mode of Expression: Verbal Verbal Expression Overall Verbal Expression: Impaired Initiation: Impaired Automatic Speech: Counting;Day of week (max assist) Level of Generative/Spontaneous Verbalization: Word Repetition: Impaired Level of Impairment: Word level Naming:  (TBA) Pragmatics: Impairment Impairments: Eye contact;Abnormal affect Interfering Components: Attention Written Expression Written Expression: Not tested   Oral / Motor  Oral Motor/Sensory Function Overall Oral Motor/Sensory Function: Mild impairment Facial ROM: Reduced right;Suspected CN VII (facial) dysfunction Facial Symmetry: Abnormal symmetry right;Suspected CN VII (facial) dysfunction Facial Strength: Reduced right;Suspected CN VII (facial) dysfunction Lingual ROM: Other (Comment) (unable to  lateralize) Motor Speech Overall Motor Speech: Impaired Respiration: Within functional limits Phonation: Low vocal intensity Resonance: Within functional limits Articulation:  (will assess further) Intelligibility: Intelligibility reduced Word: 50-74% accurate Motor Planning:  (will assess further)   GO                    Royce Macadamia 05/14/2021, 12:31 PM  Breck Coons Lonell Face.Ed Nurse, children's [phone] Office [phone]

## 2021-05-14 NOTE — Progress Notes (Signed)
PULMONARY / CRITICAL CARE MEDICINE   Name: Marissa Morris MRN: [id number] DOB: 08/29/1960    ADMISSION DATE:  05/13/2021 CONSULTATION DATE:  05/14/21   REFERRING MD: Stroke, Md, MD  CHIEF COMPLAINT:  Chief Complaint  Patient presents with  . Cerebrovascular Accident    hemorrhagic    HISTORY OF PRESENT ILLNESS:   Marissa Morris is a 60 y.o. female presenting to [facility]  after transfer from [facility] [phone]) for management of acute intracranial hemorrhage.  Patient was very somnolent and unable to answer questions coherently.  Per notes and records from [place], EMS had been called to the patient's home and on arrival the patient was found to be slumped over in a chair. She projectile vomited and then was noted to be flaccid on the right with right facial droop and inability to correctly answer questions On arrival to the ED, BP was 222/128. CT head revealed a 3 cm thalamic hemorrhage with intraventricular extension.   Initial NIHSS was 25. Initial exam by EDP at OSH: Patient awake with slow but comprehensible speech. Oriented to person. Confused. CN were normal. LUE and LLE 5/5, RUE and RLE with flaccid paralysis. Follow up NIHSS was 17.  Collateral: Gerrie Nordmann [phone]); patient sister, for more formation.  Yesterday, patient was watching television boyfriend.  She states that her sister slumped over in her chair and was unable to answer questions.  She reports that the boyfriend was unable to understand what she was saying.  Patient then started vomiting and EMS was called.  Patient's sister does not know her complete medical history, but states she has a history of hypertension, hyperlipidemia and thyroid dysfunction; she was unsure patient has hypothyroidism or hyperthyroidism.  She believes that her sister is a patient at [facility] in [place]. Called [facility]; she last say them in Oct 2019. They were unable to  given current information about pmh and home medications.   PAST MEDICAL HISTORY :  She  has no past medical history on file.  PAST SURGICAL HISTORY: She  has no past surgical history on file.  No Known Allergies  No current facility-administered medications on file prior to encounter.   No current outpatient medications on file prior to encounter.    FAMILY HISTORY:  Her has no family status information on file.    SOCIAL HISTORY: Unable to get complete history from patient due to good her somnolence. Per sister, patient is lives with boyfriend. She was not aware of patient Etoh or substance use.   REVIEW OF SYSTEMS:   Patient somnolent; unable to complete review of systems   VITAL SIGNS: BP 139/71   Pulse 88   Temp 98.9 F (37.2 C) (Axillary)   Resp 20   Wt 75.3 kg   SpO2 91%   HEMODYNAMICS:  Patient is hemodynamically stable.  Currently on Clevipex for blood pressure management.   VENTILATOR SETTINGS:   n/a  INTAKE / OUTPUT: I/O last 3 completed shifts: In: 370.3 [I.V.:370.3] Out: 500 [Urine:500]  PHYSICAL EXAMINATION: General: Patient lying in bed in no acute distress.  Neuro: Patient is somnolent.  Patient unable to verbally communicate: Unable to assess orientation questions.  She does open her eyes to voice, but repeat to keep her eyes open.  Patient is currently nonverbal.  Patient is able to follow limited commands.  Patient able to move both LUE and LLE 4/5.  Limited movement of RUE and RLE; 1/5.  Patient can move all  4 extremities with noxious stimulus.  Reflexes intact on both right side and reflex diminished on patient's left side. left gaze preference.  Patient able to follow finger movements on exam PERRL. Right facial droop.  HEENT:   Normocephalic,  Mucous membranes moist, no mucosal lesions.  Cardiovascular: Normal rate and rhythm. No M/R/G Lungs:  Patient normal work of breathing.  No wheezing or rhonchi Abdomen: Normal bowel  sounds Musculoskeletal:  LUE and LLE 4/5.  RUE and RLE; 1/5.  LABS:  BMET Recent Labs  Lab 05/14/21 0119  NA 141  K 3.5  CL 105  CO2 27  BUN 10  CREATININE 0.83  GLUCOSE 117*    Electrolytes Recent Labs  Lab 05/14/21 0119  CALCIUM 8.8*    CBC Recent Labs  Lab 05/14/21 0119  WBC 12.8*  HGB 13.6  HCT 42.9  PLT 192    Coag's Recent Labs  Lab 05/14/21 0119  APTT 28  INR 1.0    Sepsis Markers No results for input(s): LATICACIDVEN, PROCALCITON, O2SATVEN in the last 168 hours.  ABG No results for input(s): PHART, PCO2ART, PO2ART in the last 168 hours.  Liver Enzymes Recent Labs  Lab 05/14/21 0119  AST 33  ALT 23  ALKPHOS 84  BILITOT 0.7  ALBUMIN 4.1    Cardiac Enzymes No results for input(s): TROPONINI, PROBNP in the last 168 hours.  Glucose Recent Labs  Lab 05/14/21 0818  GLUCAP 107*    Imaging DG Abd 1 View  Result Date: 05/14/2021 CLINICAL DATA:  60 year old female with intracranial hemorrhage. EXAM: ABDOMEN - 1 VIEW COMPARISON:  Portable pelvis radiograph today reported separately. FINDINGS: Portable AP supine view at 0344 hours. Non obstructed bowel gas pattern. No definite pneumoperitoneum on this supine view. Minimally included left lung base appears grossly negative. Scoliosis and degenerative changes in the spine. Pelvic dystrophic and/or vascular calcifications, mild. No acute osseous abnormality identified. IMPRESSION: 1. Normal bowel gas pattern. 2. Spinal scoliosis and degeneration. Electronically Signed   By: Odessa Fleming M.D.   On: 05/14/2021 06:19   CT HEAD WO CONTRAST  Result Date: 05/14/2021 CLINICAL DATA:  Intraparenchymal hemorrhage.  Follow-up. EXAM: CT HEAD WITHOUT CONTRAST TECHNIQUE: Contiguous axial images were obtained from the base of the skull through the vertex without intravenous contrast. COMPARISON:  Same day CT head. FINDINGS: Mildly motion limited exam.  Within this limitation: Brain: When accounting for differences in  technique, similar size of the acute intraparenchymal hemorrhage centered in the left thalamus which measures up to 3.3 x 2.2 x 3.3 cm (similar when remeasured on the prior). Similar regional mass effect. Similar intraventricular hemorrhage within the left greater than right lateral ventricles, third ventricle, and fourth ventricle. Similar ventriculomegaly with mild periventricular edema and prominence of the temporal horns. Additional scattered white matter hypoattenuation is nonspecific but most likely related to chronic microvascular ischemic disease. No evidence of acute large vascular territory infarct. Similar suspected prior infarct in the right corona radiata. Vascular: No hyperdense vessel identified. Calcific atherosclerosis. Skull: No acute fracture. Sinuses/Orbits: No acute findings. Other: No mastoid effusions. IMPRESSION: When accounting for differences in technique, similar size of the acute left thalamic intraparenchymal hemorrhage. Similar intraventricular extension of hemorrhage and ventriculomegaly. Electronically Signed   By: Feliberto Harts MD   On: 05/14/2021 06:21   CT HEAD WO CONTRAST  Result Date: 05/14/2021 CLINICAL DATA:  Intraparenchymal hemorrhage. Follow-up of scans performed at [place], [place] hospital. EXAM: CT HEAD WITHOUT CONTRAST TECHNIQUE: Contiguous axial images were obtained from the base  of the skull through the vertex without intravenous contrast. COMPARISON:  None. I am not able to view the outside institution scans. FINDINGS: Brain: Intraparenchymal hematoma centered in the left thalamus measures 3.2 x 2.2 x 3.0 cm (volume = 11 cm^3). There is moderate hydrocephalus with periventricular hypoattenuation suggesting transependymal interstitial edema. There is rightward bulging of the septum pellucidum. Large amount of blood in the ventricles. Vascular: No abnormal hyperdensity of the major intracranial arteries or dural venous sinuses. No intracranial atherosclerosis.  Skull: The visualized skull base, calvarium and extracranial soft tissues are normal. Sinuses/Orbits: No fluid levels or advanced mucosal thickening of the visualized paranasal sinuses. No mastoid or middle ear effusion. The orbits are normal. IMPRESSION: 1. Intraparenchymal hematoma centered in the left thalamus with intraventricular extension and moderate hydrocephalus with transependymal interstitial edema. Critical Value/emergent results were called by telephone at the time of interpretation on 05/14/2021 at 12:58 am to provider ERIC [facility] , who verbally acknowledged these results. Electronically Signed   By: Deatra Robinson M.D.   On: 05/14/2021 00:58   MR MRA HEAD WO CONTRAST  Result Date: 05/14/2021 CLINICAL DATA:  Intracranial hemorrhage EXAM: MRI HEAD WITHOUT CONTRAST MRA HEAD WITHOUT CONTRAST TECHNIQUE: Multiplanar, multi-echo pulse sequences of the brain and surrounding structures were acquired without intravenous contrast. Angiographic images of the Circle of Willis were acquired using MRA technique without intravenous contrast. COMPARISON: No pertinent prior exam. COMPARISON:  Correlation made with prior head CT FINDINGS: MRI HEAD Brain: Left thalamic hemorrhage is again identified similar to the recent CT with acute blood products. There is mild surrounding edema. Significant intraventricular extension is present as seen on CT with resulting hydrocephalus. Focus susceptibility in the right corona radiata reflects chronic microhemorrhage associated with a lacunar infarct. Additional patchy and confluent areas of T2 hyperintensity in the supratentorial white matter are nonspecific but probably reflect moderate chronic microvascular ischemic changes. No post contrast imaging performed therefore potential underlying lesion is not well evaluated. Vascular: Major vessel flow voids at the skull base are preserved. Skull and upper cervical spine: Normal marrow signal is preserved. Sinuses/Orbits: Paranasal  sinuses are aerated. Orbits are unremarkable. Other: Sella is unremarkable.  Mastoid air cells are clear. MRA HEAD Intracranial internal carotid arteries are patent. Middle and anterior cerebral arteries are patent. Intracranial vertebral arteries, basilar artery, posterior cerebral arteries are patent. There is no significant stenosis or aneurysm. No abnormal flow related enhancement in the region of thalamic hemorrhage. IMPRESSION: No substantial change in left thalamic hemorrhage with significant intraventricular extension and hydrocephalus as well as mild edema and mass effect compared to recent CT. 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 Moderate chronic microvascular ischemic changes. Unremarkable MRA. Electronically Signed   By: Guadlupe Spanish M.D.   On: 05/14/2021 11:18   MR BRAIN WO CONTRAST  Result Date: 05/14/2021 CLINICAL DATA:  Intracranial hemorrhage EXAM: MRI HEAD WITHOUT CONTRAST MRA HEAD WITHOUT CONTRAST TECHNIQUE: Multiplanar, multi-echo pulse sequences of the brain and surrounding structures were acquired without intravenous contrast. Angiographic images of the Circle of Willis were acquired using MRA technique without intravenous contrast. COMPARISON: No pertinent prior exam. COMPARISON:  Correlation made with prior head CT FINDINGS: MRI HEAD Brain: Left thalamic hemorrhage is again identified similar to the recent CT with acute blood products. There is mild surrounding edema. Significant intraventricular extension is present as seen on CT with resulting hydrocephalus. Focus susceptibility in the right corona radiata reflects chronic microhemorrhage associated with a lacunar infarct. Additional patchy  and confluent areas of T2 hyperintensity in the supratentorial white matter are nonspecific but probably reflect moderate chronic microvascular ischemic changes. No post contrast imaging performed therefore potential underlying lesion is not well  evaluated. Vascular: Major vessel flow voids at the skull base are preserved. Skull and upper cervical spine: Normal marrow signal is preserved. Sinuses/Orbits: Paranasal sinuses are aerated. Orbits are unremarkable. Other: Sella is unremarkable.  Mastoid air cells are clear. MRA HEAD Intracranial internal carotid arteries are patent. Middle and anterior cerebral arteries are patent. Intracranial vertebral arteries, basilar artery, posterior cerebral arteries are patent. There is no significant stenosis or aneurysm. No abnormal flow related enhancement in the region of thalamic hemorrhage. IMPRESSION: No substantial change in left thalamic hemorrhage with significant intraventricular extension and hydrocephalus as well as mild edema and mass effect compared to recent CT. 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 Moderate chronic microvascular ischemic changes. Unremarkable MRA. Electronically Signed   By: Guadlupe Spanish M.D.   On: 05/14/2021 11:18   DG Pelvis Portable  Result Date: 05/14/2021 CLINICAL DATA:  60 year old female with intracranial hemorrhage. EXAM: PORTABLE PELVIS 1-2 VIEWS COMPARISON:  None. FINDINGS: Portable AP view at 0346 hours. Mild likely fibroid associated dystrophic calcifications in the pelvis eccentric to the right. Femoral heads normally located. Pelvis and proximal femurs appear intact. No acute osseous abnormality identified. Negative visible bowel gas pattern. IMPRESSION: Negative. Electronically Signed   By: Odessa Fleming M.D.   On: 05/14/2021 06:18   DG CHEST PORT 1 VIEW  Result Date: 05/14/2021 CLINICAL DATA:  Provided history: Follow-up. EXAM: PORTABLE CHEST 1 VIEW COMPARISON:  None. FINDINGS: Cardiomediastinal silhouette is enlarged, likely accentuated by AP technique. Calcific atherosclerosis of the aorta. Low lung volumes. No consolidation. EKG leads bilaterally. No visible pleural effusions or pneumothorax on this single semi erect AP  radiograph. IMPRESSION: 1. Low lung volumes without evidence of acute cardiopulmonary disease. 2. Cardiomegaly. Electronically Signed   By: Feliberto Harts MD   On: 05/14/2021 06:13   VAS US CAROTID  Result Date: 05/14/2021 Carotid Arterial Duplex Study Patient Name:  FLEDA PAGEL  Date of Exam:   05/14/2021 Medical Rec #: [id number]     Accession #:    [id number] Date of Birth: 08/29/1960     Patient Gender: F Patient Age:   60Y Exam Location:  [facility] Procedure:      VAS US CAROTID Referring Phys: [id number] Marvel Plan --------------------------------------------------------------------------------  Indications:       Hypertensive emergency (122/128). 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 Risk Factors:      Hypertension. Comparison Study:  No prior study on file Performing Technologist: Sherren Kerns RVS  Examination Guidelines: A complete evaluation includes B-mode imaging, spectral Doppler, color Doppler, and power Doppler as needed of all accessible portions of each vessel. Bilateral testing is considered an integral part of a complete examination. Limited examinations for reoccurring indications may be performed as noted.  Right Carotid Findings: +----------+--------+--------+--------+------------------+--------+           PSV cm/sEDV cm/sStenosisPlaque DescriptionComments +----------+--------+--------+--------+------------------+--------+ CCA Prox  96      9                                          +----------+--------+--------+--------+------------------+--------+  CCA Distal104     13                                         +----------+--------+--------+--------+------------------+--------+ ICA Prox  66      15                                         +----------+--------+--------+--------+------------------+--------+ ICA Distal54      13                                          +----------+--------+--------+--------+------------------+--------+ ECA       151     22                                         +----------+--------+--------+--------+------------------+--------+ +----------+--------+-------+--------+-------------------+           PSV cm/sEDV cmsDescribeArm Pressure (mmHG) +----------+--------+-------+--------+-------------------+ Subclavian150                                        +----------+--------+-------+--------+-------------------+ +---------+--------+--+--------+--+ VertebralPSV cm/s41EDV cm/s12 +---------+--------+--+--------+--+  Left Carotid Findings: +----------+--------+--------+--------+------------------+--------+           PSV cm/sEDV cm/sStenosisPlaque DescriptionComments +----------+--------+--------+--------+------------------+--------+ CCA Prox  180     15                                         +----------+--------+--------+--------+------------------+--------+ CCA Distal98      13                                         +----------+--------+--------+--------+------------------+--------+ ICA Prox  70      22                                         +----------+--------+--------+--------+------------------+--------+ ICA Distal68      22                                         +----------+--------+--------+--------+------------------+--------+ ECA       91      18                                         +----------+--------+--------+--------+------------------+--------+ +----------+--------+--------+--------+-------------------+           PSV cm/sEDV cm/sDescribeArm Pressure (mmHG) +----------+--------+--------+--------+-------------------+ [id number]                                          +----------+--------+--------+--------+-------------------+ +---------+--------+--+--------+--+  VertebralPSV cm/s61EDV cm/s13 +---------+--------+--+--------+--+      Preliminary       STUDIES:   Lab Orders     Resp Panel by RT-PCR (Flu A&B, Covid) Nasopharyngeal Swab     MRSA PCR Screening     HIV Antibody (routine testing w rflx)     CBC     Comprehensive metabolic panel     Protime-INR     APTT     Lipid panel     Hemoglobin A1c     Urinalysis, Routine w reflex microscopic     Urine rapid drug screen (hosp [facility]     Glucose, capillary  LINES/TUBES: 6/2 EVD    ASSESSMENT / PLAN: Ms. Hassan RowanDeloris Malstrom is a 60 y.o. female with a past medical history, reported from sister, of hypertension and hyperlipidemia  admitted for N/V, right sided weakness and facial droop in the setting of intracranial hemorrhage with 3 cm thalamic hemorrhage with intraventricular extension.   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   S/p EVD 6/2  On keppra 500mg  bid for seizure prophylaxis  Carotid Doppler Pending    Echo pending   Hyperlipidemia  LDL 121, goal < 70  Consider statin   Dysphagia   Did not pass swallow  NG Tube needed  Patient is not intubated, but may need it in the future given dysphagia and increased aspiration risk. Patient is currently statting well on room air.   Hypothyroidism or Hyperthyroidism? - reported hx of thyroid dysfunction  - TSH, free T4, free T3 pending   Leukocytosis  - WBC of 12.8  - no clear infection source. Currently afebrile.   Hyperglycemia - No Known Hx of DM. - glucose currently 107 - glucose < 200  - HbA1C pending    RASS goal: 0  Dennie Bibleatrick Bianco Cange MS4 05/14/2021, 11:57 AM

## 2021-05-14 NOTE — Progress Notes (Signed)
  Echocardiogram 2D Echocardiogram has been performed.  Gerda Diss 05/14/2021, 4:58 PM

## 2021-05-14 NOTE — ED Notes (Signed)
Pt appearing more restless at this time.

## 2021-05-14 NOTE — Progress Notes (Signed)
DR Otelia Limes advised to continue cleviprex and same parameter over night- done

## 2021-05-14 NOTE — Progress Notes (Signed)
Received pt on bed comfortable not in distress, with O2 via [place] at 2 LPM, pt followed simple command with eye opening when being called, with ongoing cleviprex infusion 30 mg/hr, on NPO, with pure wick to wall suction, with EVD to drainage bag

## 2021-05-14 NOTE — ED Notes (Signed)
Pt to floor with this RN to monitor.

## 2021-05-14 NOTE — Progress Notes (Signed)
VASCULAR LAB    Carotid duplex has been performed.  See CV proc for preliminary results.   Gabriela Giannelli, RVT 05/14/2021, 11:49 AM

## 2021-05-14 NOTE — Progress Notes (Signed)
PT Cancellation Note  Patient Details Name: Marissa Morris MRN: [id number] DOB: 08/29/1960   Cancelled Treatment:    Reason Eval/Treat Not Completed: Patient not medically ready;Patient at procedure or test/unavailable this morning, leaving floor for MRI upon PT arrival. Per RN, pt with some difficulty breathing and may need drain placement. PT will continue to follow and evaluate as appropriate.   Deland Pretty, DPT   Acute Rehabilitation Department Pager #: [phone]   Gaetana Michaelis 05/14/2021, 9:27 AM

## 2021-05-14 NOTE — Procedures (Signed)
PREOP DX: Hydrocephalus  POSTOP DX: Same  PROCEDURE: Right frontal ventriculostomy catheter placement  SURGEON: Dr. Hoyt Koch, MD  ANESTHESIA: IV Sedation (fentanyl) with Local  EBL: Minimal  SPECIMENS: None  COMPLICATIONS: None  CONDITION: Hemodynamically stable  INDICATIONS: Mrs. Marissa Morris is a 60 y.o. female  With a thalamic hemorrhage and IVH with ventriculomegaly.  She developed increased somnolence, so an EVD was recommended.  PROCEDURE IN DETAIL: After consent was obtained from the patient's family, skin of the right frontal scalp was clipped, prepped and draped in the usual sterile fashion.  A timeout was performed.  Scalp was then infiltrated with local anesthetic with epinephrine.  Skin incision was made sharply, and twist drill burr hole was made.  The dura was then incised, and the ventricular catheter was passed on first attempt into the right lateral ventricle.  Good CSF flow was obtained.  The catheter was then tunneled subcutaneously and connected to a drainage system and the skin incision closed.  The drain was then secured in place.  FINDINGS: 1. Opening pressure 18 cm H20 2. Blood-tinged CSF

## 2021-05-14 NOTE — Progress Notes (Signed)
Report give to RN Kaitlin for continuity of care, still with ongoing Clevidipine  infusion

## 2021-05-14 NOTE — Progress Notes (Signed)
STROKE TEAM PROGRESS NOTE   SUBJECTIVE (INTERVAL HISTORY) Her RN is at the bedside. Pt mental status fluctuate but still very drowsy and sleepy, able to open eyes on voice and followed limited commands but if without stimulation she fell right back to sleep. NSG on board, planning for EVD placement. 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   OBJECTIVE Temp:  [98.2 F (36.8 C)-98.9 F (37.2 C)] 98.9 F (37.2 C) (06/02 0400) Pulse Rate:  [62-102] 90 (06/02 1130) Resp:  [13-23] 16 (06/02 1130) BP: (115-165)/(60-97) 132/76 (06/02 1130) SpO2:  [75 %-100 %] 100 % (06/02 1130) Weight:  [75.3 kg] 75.3 kg (06/01 2256)  Recent Labs  Lab 05/14/21 0818  GLUCAP 107*   Recent Labs  Lab 05/14/21 0119  NA 141  K 3.5  CL 105  CO2 27  GLUCOSE 117*  BUN 10  CREATININE 0.83  CALCIUM 8.8*   Recent Labs  Lab 05/14/21 0119  AST 33  ALT 23  ALKPHOS 84  BILITOT 0.7  PROT 7.6  ALBUMIN 4.1   Recent Labs  Lab 05/14/21 0119  WBC 12.8*  HGB 13.6  HCT 42.9  MCV 99.8  PLT 192   No results for input(s): CKTOTAL, CKMB, CKMBINDEX, TROPONINI in the last 168 hours. Recent Labs    05/14/21 0119  LABPROT 13.1  INR 1.0   Recent Labs    05/14/21 0254  COLORURINE YELLOW  LABSPEC 1.008  PHURINE 9.0*  GLUCOSEU NEGATIVE  HGBUR NEGATIVE  BILIRUBINUR NEGATIVE  KETONESUR NEGATIVE  PROTEINUR NEGATIVE  NITRITE NEGATIVE  LEUKOCYTESUR TRACE*       Component Value Date/Time   CHOL 203 (H) 05/14/2021 0022   TRIG 71 05/14/2021 0022   HDL 68 05/14/2021 0022   CHOLHDL 3.0 05/14/2021 0022   VLDL 14 05/14/2021 0022   LDLCALC 121 (H) 05/14/2021 0022   No results found for: HGBA1C    Component Value Date/Time   LABOPIA NONE DETECTED 05/14/2021 0254   COCAINSCRNUR NONE DETECTED 05/14/2021 0254   LABBENZ NONE DETECTED 05/14/2021 0254   AMPHETMU NONE DETECTED 05/14/2021 0254   THCU NONE DETECTED 05/14/2021 0254   LABBARB NONE DETECTED 05/14/2021 0254    No  results for input(s): ETH in the last 168 hours.  I have personally reviewed the radiological images below and agree with the radiology interpretations.  DG Abd 1 View  Result Date: 05/14/2021 CLINICAL DATA:  60 year old female with intracranial hemorrhage. EXAM: ABDOMEN - 1 VIEW COMPARISON:  Portable pelvis radiograph today reported separately. FINDINGS: Portable AP supine view at 0344 hours. Non obstructed bowel gas pattern. No definite pneumoperitoneum on this supine view. Minimally included left lung base appears grossly negative. Scoliosis and degenerative changes in the spine. Pelvic dystrophic and/or vascular calcifications, mild. No acute osseous abnormality identified. IMPRESSION: 1. Normal bowel gas pattern. 2. Spinal scoliosis and degeneration. Electronically Signed   By: Odessa Fleming M.D.   On: 05/14/2021 06:19   CT HEAD WO CONTRAST  Result Date: 05/14/2021 CLINICAL DATA:  Intraparenchymal hemorrhage.  Follow-up. EXAM: CT HEAD WITHOUT CONTRAST TECHNIQUE: Contiguous axial images were obtained from the base of the skull through the vertex without intravenous contrast. COMPARISON:  Same day CT head. FINDINGS: Mildly motion limited exam.  Within this limitation: Brain: When accounting for differences in technique, similar size of the acute intraparenchymal hemorrhage centered in the left thalamus which measures up to 3.3 x 2.2 x 3.3 cm (similar when remeasured on the prior). Similar regional mass effect. Similar  intraventricular hemorrhage within the left greater than right lateral ventricles, third ventricle, and fourth ventricle. Similar ventriculomegaly with mild periventricular edema and prominence of the temporal horns. Additional scattered white matter hypoattenuation is nonspecific but most likely related to chronic microvascular ischemic disease. No evidence of acute large vascular territory infarct. Similar suspected prior infarct in the right corona radiata. Vascular: No hyperdense vessel  identified. Calcific atherosclerosis. Skull: No acute fracture. Sinuses/Orbits: No acute findings. Other: No mastoid effusions. IMPRESSION: When accounting for differences in technique, similar size of the acute left thalamic intraparenchymal hemorrhage. Similar intraventricular extension of hemorrhage and ventriculomegaly. Electronically Signed   By: Feliberto HartsFrederick S Jones MD   On: 05/14/2021 06:21   CT HEAD WO CONTRAST  Result Date: 05/14/2021 CLINICAL DATA:  Intraparenchymal hemorrhage. [place] [place] EXAM: CT HEAD WITHOUT CONTRAST TECHNIQUE: Contiguous axial images were obtained from the base of the skull through the vertex without intravenous contrast. COMPARISON:  None. I am not able to view the outside institution scans. FINDINGS: Brain: Intraparenchymal hematoma centered in the left thalamus measures 3.2 x 2.2 x 3.0 cm (volume = 11 cm^3). There is moderate hydrocephalus with periventricular hypoattenuation suggesting transependymal interstitial edema. There is rightward bulging of the septum pellucidum. Large amount of blood in the ventricles. Vascular: No abnormal hyperdensity of the major intracranial arteries or dural venous sinuses. No intracranial atherosclerosis. Skull: The visualized skull base, calvarium and extracranial soft tissues are normal. Sinuses/Orbits: No fluid levels or advanced mucosal thickening of the visualized paranasal sinuses. No mastoid or middle ear effusion. The orbits are normal. IMPRESSION: 1. Intraparenchymal hematoma centered in the left thalamus with intraventricular extension and moderate hydrocephalus with transependymal interstitial edema. Critical Value/emergent results were called by telephone at the time of interpretation on 05/14/2021 at 12:58 am to provider [facility] , who verbally acknowledged these results. Electronically Signed   By: Deatra RobinsonKevin  Herman M.D.   On: 05/14/2021 00:58   MR MRA HEAD WO CONTRAST  Result Date:  05/14/2021 CLINICAL DATA:  Intracranial hemorrhage EXAM: MRI HEAD WITHOUT CONTRAST MRA HEAD WITHOUT CONTRAST TECHNIQUE: Multiplanar, multi-echo pulse sequences of the brain and surrounding structures were acquired without intravenous contrast. Angiographic images of the Circle of Willis were acquired using MRA technique without intravenous contrast. COMPARISON: No pertinent prior exam. COMPARISON:  Correlation made with prior head CT FINDINGS: MRI HEAD Brain: Left thalamic hemorrhage is again identified similar to the recent CT with acute blood products. There is mild surrounding edema. Significant intraventricular extension is present as seen on CT with resulting hydrocephalus. Focus susceptibility in the right corona radiata reflects chronic microhemorrhage associated with a lacunar infarct. Additional patchy and confluent areas of T2 hyperintensity in the supratentorial white matter are nonspecific but probably reflect moderate chronic microvascular ischemic changes. No post contrast imaging performed therefore potential underlying lesion is not well evaluated. Vascular: Major vessel flow voids at the skull base are preserved. Skull and upper cervical spine: Normal marrow signal is preserved. Sinuses/Orbits: Paranasal sinuses are aerated. Orbits are unremarkable. Other: Sella is unremarkable.  Mastoid air cells are clear. MRA HEAD Intracranial internal carotid arteries are patent. Middle and anterior cerebral arteries are patent. Intracranial vertebral arteries, basilar artery, posterior cerebral arteries are patent. There is no significant stenosis or aneurysm. No abnormal flow related enhancement in the region of thalamic hemorrhage. IMPRESSION: No substantial change in left thalamic hemorrhage with significant intraventricular extension and hydrocephalus as well as mild edema and mass effect compared to recent CT. 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 Moderate  chronic microvascular ischemic changes. Unremarkable MRA. Electronically Signed   By: Guadlupe Spanish M.D.   On: 05/14/2021 11:18   MR BRAIN WO CONTRAST  Result Date: 05/14/2021 CLINICAL DATA:  Intracranial hemorrhage EXAM: MRI HEAD WITHOUT CONTRAST MRA HEAD WITHOUT CONTRAST TECHNIQUE: Multiplanar, multi-echo pulse sequences of the brain and surrounding structures were acquired without intravenous contrast. Angiographic images of the Circle of Willis were acquired using MRA technique without intravenous contrast. COMPARISON: No pertinent prior exam. COMPARISON:  Correlation made with prior head CT FINDINGS: MRI HEAD Brain: Left thalamic hemorrhage is again identified similar to the recent CT with acute blood products. There is mild surrounding edema. Significant intraventricular extension is present as seen on CT with resulting hydrocephalus. Focus susceptibility in the right corona radiata reflects chronic microhemorrhage associated with a lacunar infarct. Additional patchy and confluent areas of T2 hyperintensity in the supratentorial white matter are nonspecific but probably reflect moderate chronic microvascular ischemic changes. No post contrast imaging performed therefore potential underlying lesion is not well evaluated. Vascular: Major vessel flow voids at the skull base are preserved. Skull and upper cervical spine: Normal marrow signal is preserved. Sinuses/Orbits: Paranasal sinuses are aerated. Orbits are unremarkable. Other: Sella is unremarkable.  Mastoid air cells are clear. MRA HEAD Intracranial internal carotid arteries are patent. Middle and anterior cerebral arteries are patent. Intracranial vertebral arteries, basilar artery, posterior cerebral arteries are patent. There is no significant stenosis or aneurysm. No abnormal flow related enhancement in the region of thalamic hemorrhage. IMPRESSION: No substantial change in left thalamic hemorrhage with significant intraventricular extension and  hydrocephalus as well as mild edema and mass effect compared to recent CT. 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 Moderate chronic microvascular ischemic changes. Unremarkable MRA. Electronically Signed   By: Guadlupe Spanish M.D.   On: 05/14/2021 11:18   DG Pelvis Portable  Result Date: 05/14/2021 CLINICAL DATA:  60 year old female with intracranial hemorrhage. EXAM: PORTABLE PELVIS 1-2 VIEWS COMPARISON:  None. FINDINGS: Portable AP view at 0346 hours. Mild likely fibroid associated dystrophic calcifications in the pelvis eccentric to the right. Femoral heads normally located. Pelvis and proximal femurs appear intact. No acute osseous abnormality identified. Negative visible bowel gas pattern. IMPRESSION: Negative. Electronically Signed   By: Odessa Fleming M.D.   On: 05/14/2021 06:18   DG CHEST PORT 1 VIEW  Result Date: 05/14/2021 CLINICAL DATA:  Provided history: Follow-up. EXAM: PORTABLE CHEST 1 VIEW COMPARISON:  None. FINDINGS: Cardiomediastinal silhouette is enlarged, likely accentuated by AP technique. Calcific atherosclerosis of the aorta. Low lung volumes. No consolidation. EKG leads bilaterally. No visible pleural effusions or pneumothorax on this single semi erect AP radiograph. IMPRESSION: 1. Low lung volumes without evidence of acute cardiopulmonary disease. 2. Cardiomegaly. Electronically Signed   By: Feliberto Harts MD   On: 05/14/2021 06:13   VAS US CAROTID  Result Date: 05/14/2021 Carotid Arterial Duplex Study Patient Name:  Marissa Morris  Date of Exam:   05/14/2021 Medical Rec #: [id number]     Accession #:    [id number] Date of Birth: 08/29/1960     Patient Gender: F Patient Age:   60Y Exam Location[facility]  [facility] Procedure:      VAS US CAROTID Referring Phys: [id number] Marvel Plan --------------------------------------------------------------------------------  Indications:       Hypertensive emergency (122/128). ICH (3 cm thalamic  hemorrhage with intraventricular extension). Risk Factors:      Hypertension. Comparison Study:  No prior study on file Performing Technologist: Sherren Kerns RVS  Examination Guidelines: A complete evaluation includes B-mode imaging, spectral Doppler, color Doppler, and power Doppler as needed of all accessible portions of each vessel. Bilateral testing is considered an integral part of a complete examination. Limited examinations for reoccurring indications may be performed as noted.  Right Carotid Findings: +----------+--------+--------+--------+------------------+--------+           PSV cm/sEDV cm/sStenosisPlaque DescriptionComments +----------+--------+--------+--------+------------------+--------+ CCA Prox  96      9                                          +----------+--------+--------+--------+------------------+--------+ CCA Distal104     13                                         +----------+--------+--------+--------+------------------+--------+ ICA Prox  66      15                                         +----------+--------+--------+--------+------------------+--------+ ICA Distal54      13                                         +----------+--------+--------+--------+------------------+--------+ ECA       151     22                                         +----------+--------+--------+--------+------------------+--------+ +----------+--------+-------+--------+-------------------+           PSV cm/sEDV cmsDescribeArm Pressure (mmHG) +----------+--------+-------+--------+-------------------+ Subclavian150                                        +----------+--------+-------+--------+-------------------+ +---------+--------+--+--------+--+ VertebralPSV cm/s41EDV cm/s12 +---------+--------+--+--------+--+  Left Carotid Findings: +----------+--------+--------+--------+------------------+--------+           PSV cm/sEDV cm/sStenosisPlaque  DescriptionComments +----------+--------+--------+--------+------------------+--------+ CCA Prox  180     15                                         +----------+--------+--------+--------+------------------+--------+ CCA Distal98      13                                         +----------+--------+--------+--------+------------------+--------+ ICA Prox  70      22                                         +----------+--------+--------+--------+------------------+--------+ ICA Distal68      22                                         +----------+--------+--------+--------+------------------+--------+  ECA       91      18                                         +----------+--------+--------+--------+------------------+--------+ +----------+--------+--------+--------+-------------------+           PSV cm/sEDV cm/sDescribeArm Pressure (mmHG) +----------+--------+--------+--------+-------------------+ [id number]                                          +----------+--------+--------+--------+-------------------+ +---------+--------+--+--------+--+ VertebralPSV cm/s61EDV cm/s13 +---------+--------+--+--------+--+      Preliminary      PHYSICAL EXAM  Temp:  [98.2 F (36.8 C)-98.9 F (37.2 C)] 98.9 F (37.2 C) (06/02 0400) Pulse Rate:  [62-102] 90 (06/02 1130) Resp:  [13-23] 16 (06/02 1130) BP: (115-165)/(60-97) 132/76 (06/02 1130) SpO2:  [75 %-100 %] 100 % (06/02 1130) Weight:  [75.3 kg] 75.3 kg (06/01 2256)  General - Well nourished, well developed, drowsy sleepy.  Ophthalmologic - fundi not visualized due to noncooperation.  Cardiovascular - Regular rhythm and rate.  Neuro - drowsy sleepy, eyes closed but open on voice, needed repetitive stimulation to keep eye open, nonverbal, not answering orientation questions, followed limited commands with eye close/open, left hand open/close, wiggled toes for me but no other commands. Left gaze preference  and barely cross midline, not blinking to visual threat consistently, PERRL. Right facial droop. Tongue protrusion not cooperative. Spontaneously moving LUE against gravity, no drift, flaccid on the RUE. Withdraw to pain LLE 3-/5, but only slight withdraw at RLE. Sensation, coordination and gait not tested.   ASSESSMENT/PLAN Marissa Morris is a 60 y.o. female with no significant history admitted for N/V, right sided weakness and facial droop. No tPA given due to ICH.    ICH with IVH:  left thalamic ICH with extensive IVH -  secondary to hypertensive emergency  CT head left thalamic ICH with IVH  Repeat CT head stable ICH and ventricles  MRI  Stable ICH and ventricles  MRA  Unremarkable.   Carotid Doppler  pending  2D Echo  pending  LDL 121  HgbA1c pending  SCDs for VTE prophylaxis  No antithrombotic prior to admission, now on No antithrombotic  Ongoing aggressive stroke risk factor management  Therapy recommendations:  Pending   Disposition:  Pending   Hydrocephalus   Slightly enlarged ventricles   Repeat CT and MRI showed stable ventricle size  NSG planning for EVD today  On keppra 500mg  bid for seizure prophylaxis   Close monitoring  Hypertensive emergency  . Unstable . On maximized cleviprex . Consider PO meds once po access  Long term BP goal normotensive  Hyperlipidemia  Home meds:  none   LDL 121, goal < 70  Continue statin at discharge  Dysphagia   Did not pass swallow  NPO now  Will put on small NG Tube  Consider po meds once po access  Other Stroke Risk Factors    Other Active Problems  Leukocytosis WBC 12.8  Hospital day # 0  This patient is critically ill due to large left thalamic ICH with IVH, hydrocephalus, dysphagia, hypertensive emergency and at significant risk of neurological worsening, death form hematoma expansion, obstructive hydrocephalus, heart failure, sepsis, seizure. This patient's care requires constant  monitoring of vital signs, hemodynamics, respiratory and cardiac monitoring, review of multiple  databases, neurological assessment, discussion with family, other specialists and medical decision making of high complexity. I spent 40 minutes of neurocritical care time in the care of this patient.   Marvel Plan, MD PhD Stroke Neurology 05/14/2021 11:52 AM    To contact Stroke Continuity provider, please refer to [email or web address]. After hours, contact General Neurology

## 2021-05-14 NOTE — Progress Notes (Signed)
OT Cancellation Note  Patient Details Name: Marissa Morris MRN: [id number] DOB: 08/29/1960   Cancelled Treatment:    Reason Eval/Treat Not Completed: Patient at procedure or test/ unavailable (MRI. will attempt later time)  [facility] Nicolena Schurman, OT/L   Acute OT Clinical Specialist Acute Rehabilitation Services Pager [phone] Office [phone]  05/14/2021, 9:24 AM

## 2021-05-14 NOTE — Progress Notes (Signed)
VASCULAR LAB    Attempted Carotid duplex at 0930 and again at 10:30.  Patient off floor for MRI. Will attempt later, as time permits.      Cordell Coke, RVT 05/14/2021, 10:30 AM

## 2021-05-14 NOTE — Progress Notes (Signed)
Pt was referred to DR Otelia Limes by RN Philippa Chester, thru secured message, discussed about the latest SBP and current infusion rate of cleviprex. Dr Otelia Limes advised to continue same rate of cleviprex a max rate of 30 mg/hr since the goal SBp was not below 140 mmhg and need to stabilize SBP with oral antihypertensive med, Dr Otelia Limes order of oral/NG lisinopril but updated that pt has no NGT and had a plan to insert contrack tomorrow

## 2021-05-14 NOTE — Evaluation (Signed)
Speech Language Pathology Evaluation Patient Details Name: Marissa Morris MRN: [id number] DOB: 08/29/1960 Today's Date: 05/14/2021 Time: 6160-7371 SLP Time Calculation (min) (ACUTE ONLY): 15 min  Problem List:  Patient Active Problem List   Diagnosis Date Noted  . ICH (intracerebral hemorrhage) (HCC) 05/14/2021   Past Medical History: History reviewed. No pertinent past medical history. Past Surgical History: History reviewed. No pertinent surgical history. HPI:  Marissa Morris is a 60 y.o. female presenting to [facility] in transfer from [facility] for management of acute intracranial hemorrhage. 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 Moderate chronic microvascular ischemic changes. No PMH on file.   Assessment / Plan / Recommendation Clinical Impression  Pt was sleepy and able to remain awake for >10 second intervals. Exhibits global aphasia and receptive with verbal output less than her receptive ability during assessment. Able to track therapist to her right affected side and unable to maintain. Responded to questions with head nods/shake or verbal "uh huh" with yes/no approx 50% accuracy. Followed one step commands accurately half of the time with perseverations noted. Verbal/visual (writtent)/tacitle assist to count in unison needing repeated trials and 80% accuracy; days of the week visualizing word with 40%. Answered y/n questions with visualization of calendar with 80% acc. Diagnostic assessment of cognition will be ongoing during therapeutic session and when more alert. Suspect awareness, problem solving are reduced. Her attention is fleeting due to lethargyand needs cues for right side of environment. Recommend CIR therapy.    SLP Assessment  SLP  Recommendation/Assessment: Patient needs continued Speech Lanaguage Pathology Services SLP Visit Diagnosis: Aphasia (R47.01);Cognitive communication deficit (R41.841)    Follow Up Recommendations  Inpatient Rehab    Frequency and Duration min 2x/week  2 weeks      SLP Evaluation Cognition  Overall Cognitive Status: Impaired/Different from baseline Arousal/Alertness: Lethargic Orientation Level: Oriented to person;Disoriented to time Attention: Sustained Sustained Attention: Impaired Sustained Attention Impairment: Functional basic;Verbal basic Memory:  (TBA) Awareness: Impaired Awareness Impairment: Emergent impairment Problem Solving:  (will assess further) Behaviors: Perseveration Safety/Judgment: Impaired       Comprehension  Auditory Comprehension Overall Auditory Comprehension: Impaired Yes/No Questions: Impaired Basic Biographical Questions: 51-75% accurate (75%) Basic Immediate Environment Questions: 50-74% accurate Commands: Impaired One Step Basic Commands: 50-74% accurate (50%) Interfering Components: Attention Visual Recognition/Discrimination Discrimination: Not tested Reading Comprehension Reading Status: Not tested    Expression Expression Primary Mode of Expression: Verbal Verbal Expression Overall Verbal Expression: Impaired Initiation: Impaired Automatic Speech: Counting;Day of week (max assist) Level of Generative/Spontaneous Verbalization: Word Repetition: Impaired Level of Impairment: Word level Naming:  (TBA) Pragmatics: Impairment Impairments: Eye contact;Abnormal affect Interfering Components: Attention Written Expression Written Expression: Not tested   Oral / Motor  Oral Motor/Sensory Function Overall Oral Motor/Sensory Function: Mild impairment Facial ROM: Reduced right;Suspected CN VII (facial) dysfunction Facial Symmetry: Abnormal symmetry right;Suspected CN VII (facial) dysfunction Facial Strength: Reduced right;Suspected CN VII  (facial) dysfunction Lingual ROM: Other (Comment) (unable to lateralize) Motor Speech Overall Motor Speech: Impaired Respiration: Within functional limits Phonation: Low vocal intensity Resonance: Within functional limits Articulation:  (will assess further) Intelligibility: Intelligibility reduced Word: 50-74% accurate Motor Planning:  (will assess further)   GO                    Royce Macadamia 05/14/2021, 12:48  PM Breck Coons Mishael Krysiak M.Ed Nurse, children's [phone] Office [phone]

## 2021-05-14 NOTE — Consult Note (Signed)
CC: Hemorrhagic stroke  HPI:     Patient is a 60 y.o. female presented with acute onset right hemiplegia and facial droop and nausea/vomiting, with dysartric speech.  Initial NIHSS was 25.  Her blood pressure was greater than 220 systolic.  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    Patient Active Problem List   Diagnosis Date Noted  . ICH (intracerebral hemorrhage) (HCC) 05/14/2021   History reviewed. No pertinent past medical history.  History reviewed. No pertinent surgical history.  No medications prior to admission.   No Known Allergies  Social History   Tobacco Use  . Smoking status: Not on file  . Smokeless tobacco: Not on file  Substance Use Topics  . Alcohol use: Not on file    No family history on file.   Review of Systems Review of systems not obtained due to patient factors.  Objective:   Patient Vitals for the past 8 hrs:  BP Temp Temp src Pulse Resp SpO2  05/14/21 1130 132/76 -- -- 90 16 100 %  05/14/21 1115 130/60 -- -- 88 16 100 %  05/14/21 1100 138/70 -- -- 90 -- 98 %  05/14/21 1045 (!) 147/68 -- -- 93 -- 90 %  05/14/21 1030 (!) 115/97 -- -- 96 -- 90 %  05/14/21 1015 (!) 142/78 -- -- -- -- 94 %  05/14/21 1000 138/72 -- -- -- -- 94 %  05/14/21 0945 (!) 141/71 -- -- 94 -- 95 %  05/14/21 0930 (!) 141/66 -- -- 84 -- 92 %  05/14/21 0915 131/68 -- -- 89 13 97 %  05/14/21 0900 126/72 -- -- 86 13 96 %  05/14/21 0845 (!) 141/69 -- -- 89 13 97 %  05/14/21 0830 127/69 -- -- 88 13 96 %  05/14/21 0815 (!) 142/69 -- -- 91 15 97 %  05/14/21 0800 120/70 -- -- 90 14 97 %  05/14/21 0745 137/69 -- -- 88 13 98 %  05/14/21 0730 129/67 -- -- 87 14 97 %  05/14/21 0715 135/66 -- -- 89 14 96 %  05/14/21 0700 130/70 -- -- 89 19 97 %  05/14/21 0645 137/68 -- -- 89 14 98 %  05/14/21 0630 131/70 -- -- 88 15 98 %  05/14/21 0615 137/71 -- -- 90 17 (!) 80 %  05/14/21 0600 (!) 161/77 -- -- -- -- --  05/14/21 0530 130/70 -- --  87 15 97 %  05/14/21 0515 139/70 -- -- 88 14 98 %  05/14/21 0500 128/70 -- -- 88 14 98 %  05/14/21 0445 (!) 143/72 -- -- 87 16 97 %  05/14/21 0430 138/73 -- -- 86 13 95 %  05/14/21 0415 136/70 -- -- 87 14 96 %  05/14/21 0400 (!) 148/70 98.9 F (37.2 C) Axillary 87 15 94 %   I/O last 3 completed shifts: In: 370.3 [I.V.:370.3] Out: 500 [Urine:500] Total I/O In: 145.5 [I.V.:145.5] Out: -       General : Somnolent   Head:  Normocephalic/atraumatic    Eyes: PERRL, conjunctiva/corneas clear, EOM's intact. Fundi could not be visualized Neck: Supple Chest:  Respirations unlabored Chest wall: no tenderness or deformity Heart: Regular rate and rhythm Abdomen: Soft, nontender and nondistended Extremities: warm and well-perfused Skin: normal turgor, color and texture Neurologic: Eyes open to stimulation.  Dysarthric, making sounds but no speech.  Localizes in left upper extremity, withdraws in left lower extremity.  Flaccid in right arm and leg,  right facial droop.       Data Review CBC:  Lab Results  Component Value Date   WBC 12.8 (H) 05/14/2021   RBC 4.30 05/14/2021   BMP:  Lab Results  Component Value Date   GLUCOSE 117 (H) 05/14/2021   CO2 27 05/14/2021   BUN 10 05/14/2021   CREATININE 0.83 05/14/2021   CALCIUM 8.8 (L) 05/14/2021   Coagulation:  Lab Results  Component Value Date   INR 1.0 05/14/2021   APTT 28 05/14/2021   Radiology review:  CT head and MRI brain were reviewed.  Large thalamic hypertensive hemorrhage measuring 3.1 cm.  There is intraventricular extension, with blood mostly in the left lateral ventricle, as well as in third and fourth ventricles.  There is mild ventriculomegaly.  No overt hydrocephalus.  Sulci and cisterns are patent.  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  Plan:  She does not have  overt hydrocephalus, and her decreased level of consciousness is likely due to the significant thalamic injury more so than altered CSF dynamics.  However, with her somnolence, I think that placement of an external ventricular drain may be at least somewhat helpful for improving her level of consciousness.  This will likely stay in for 5 to 10 days, with possible intraventricular tPA as well.  I discussed the procedure in detail with the patient's sister via phone.  Risks, benefits, alternatives, and expected convalescence were discussed with her.  The general technique was discussed as well.  Risks discussed included but were not limited to, bleeding, pain, infection, seizure, scar, stroke, neurologic deficit, coma, and death.  The possibility of eventual shunt was also discussed, though with her mild ventriculomegaly, I think this is unlikely.  All questions and concerns were answered.

## 2021-05-15 ENCOUNTER — Inpatient Hospital Stay (HOSPITAL_COMMUNITY): Payer: BLUE CROSS/BLUE SHIELD

## 2021-05-15 DIAGNOSIS — I610 Nontraumatic intracerebral hemorrhage in hemisphere, subcortical: Secondary | ICD-10-CM

## 2021-05-15 DIAGNOSIS — G9349 Other encephalopathy: Secondary | ICD-10-CM

## 2021-05-15 DIAGNOSIS — R4182 Altered mental status, unspecified: Secondary | ICD-10-CM

## 2021-05-15 DIAGNOSIS — I10 Essential (primary) hypertension: Secondary | ICD-10-CM

## 2021-05-15 DIAGNOSIS — G911 Obstructive hydrocephalus: Secondary | ICD-10-CM

## 2021-05-15 DIAGNOSIS — I615 Nontraumatic intracerebral hemorrhage, intraventricular: Secondary | ICD-10-CM

## 2021-05-15 LAB — BASIC METABOLIC PANEL
Anion gap: 10 (ref 5–15)
BUN: 10 mg/dL (ref 6–20)
CO2: 22 mmol/L (ref 22–32)
Calcium: 8.2 mg/dL — ABNORMAL LOW (ref 8.9–10.3)
Chloride: 104 mmol/L (ref 98–111)
Creatinine, Ser: 0.64 mg/dL (ref 0.44–1.00)
GFR, Estimated: >60 mL/min (ref >60)
Glucose, Bld: 93 mg/dL (ref 70–99)
Potassium: 3.7 mmol/L (ref 3.5–5.1)
Sodium: 136 mmol/L (ref 135–145)

## 2021-05-15 LAB — HEMOGLOBIN A1C
Hgb A1c MFr Bld: 5.7 % — ABNORMAL HIGH (ref 4.8–5.6)
Mean Plasma Glucose: 117 mg/dL

## 2021-05-15 LAB — MAGNESIUM
Magnesium: 2.1 mg/dL (ref 1.7–2.4)
Magnesium: 2.3 mg/dL (ref 1.7–2.4)

## 2021-05-15 LAB — PHOSPHORUS
Phosphorus: 3.8 mg/dL (ref 2.5–4.6)
Phosphorus: 3.4 mg/dL (ref 2.5–4.6)

## 2021-05-15 LAB — T4, FREE: Free T4: 0.86 ng/dL (ref 0.61–1.12)

## 2021-05-15 IMAGING — CT CT HEAD W/O CM
3 of 4 series · 13 of 47 positions shown, 15 images · non-contrast
Comparison: MRI and CT of the brain [DATE].

CLINICAL DATA: Cerebral hemorrhage suspected.

EXAM:
CT HEAD WITHOUT CONTRAST
TECHNIQUE: Contiguous axial images were obtained from the base of the skull
through the vertex without intravenous contrast.

[Series 3: head without · axial · non-contrast · 0.47mm/px · z∈[-220,-106]mm · 7 of 31 slices shown, 9 images]
[im 4/31  brain]
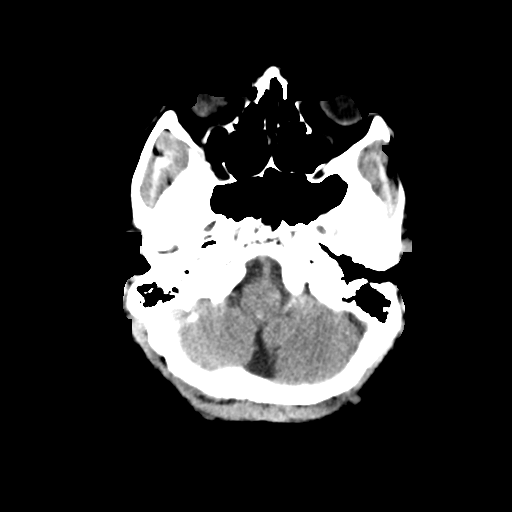
[im 4/31  bone]
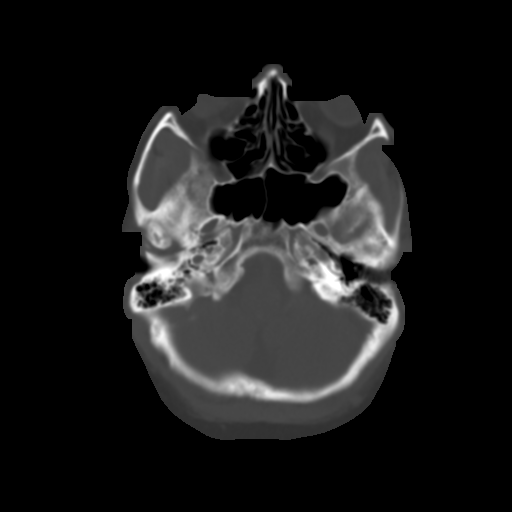
[im 8/31  brain]
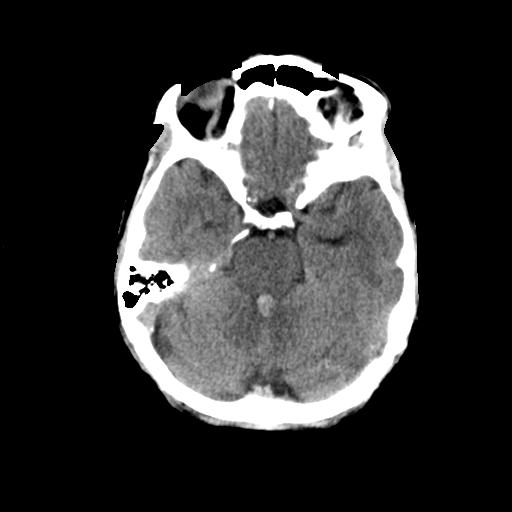
[im 12/31  brain]
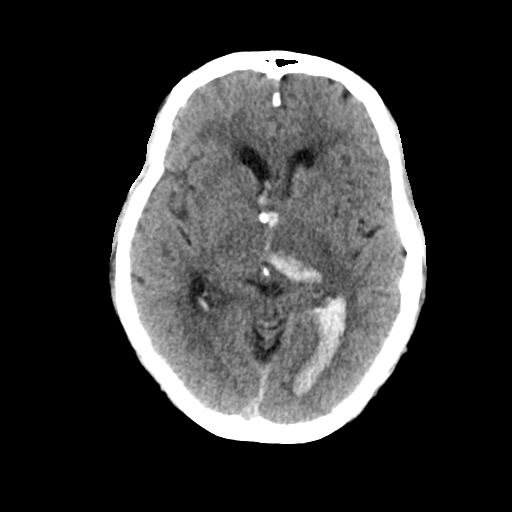
[im 16/31  brain]
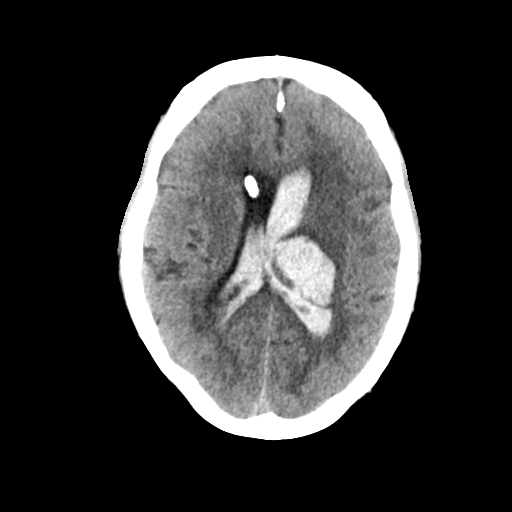
[im 19/31  brain]
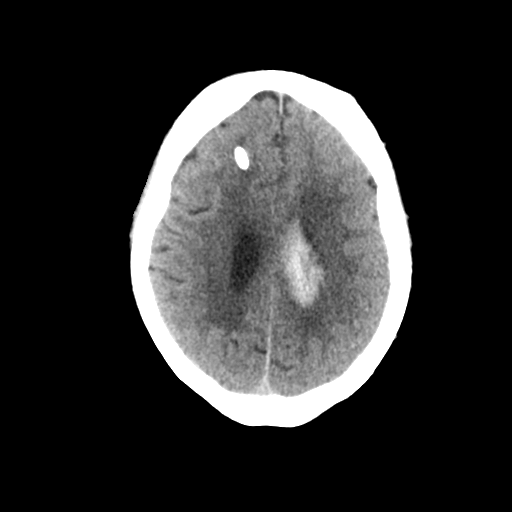
[im 19/31  bone]
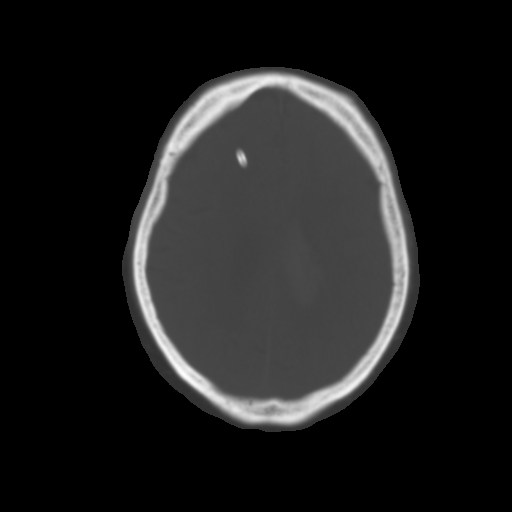
[im 23/31  brain]
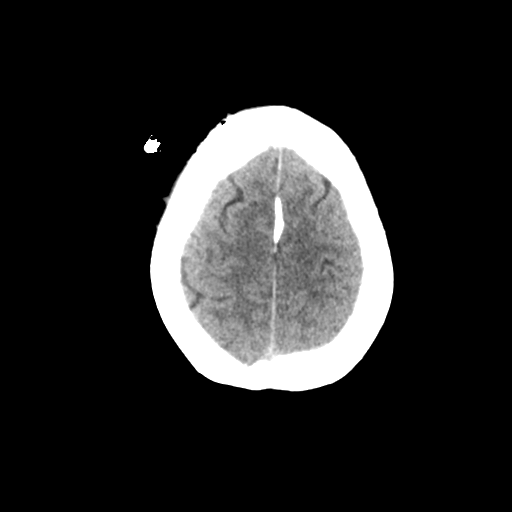
[im 27/31  brain]
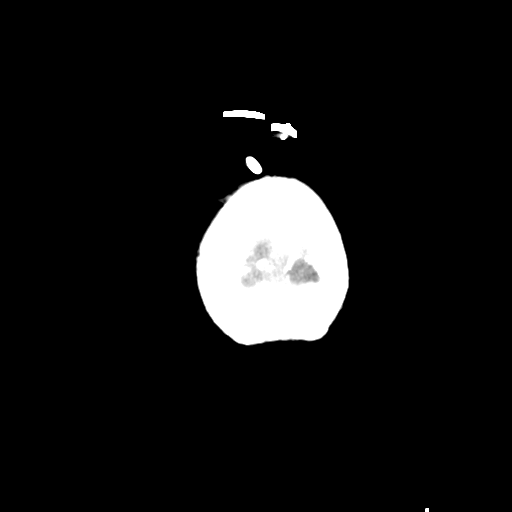

[Series 5: head without cor · coronal · non-contrast · 0.30mm/px · 3 of 75 slices shown]
[im 25/75  brain]
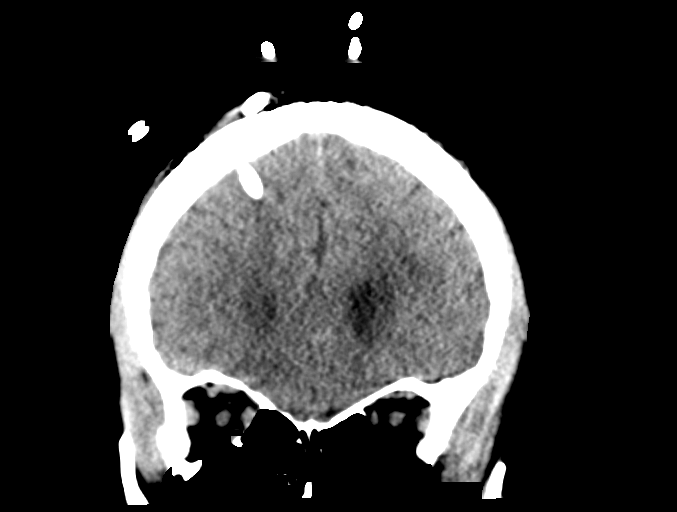
[im 33/75  brain]
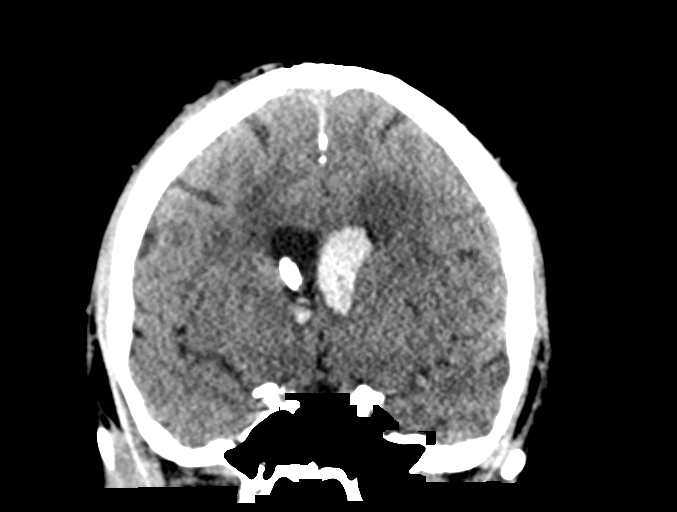
[im 42/75  brain]
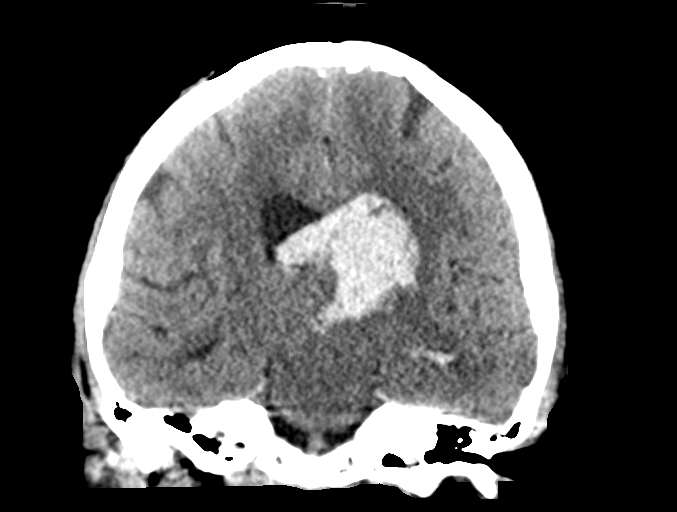

[Series 6: head without sag · sagittal · non-contrast · 0.30mm/px · 3 of 67 slices shown]
[im 23/67  brain]
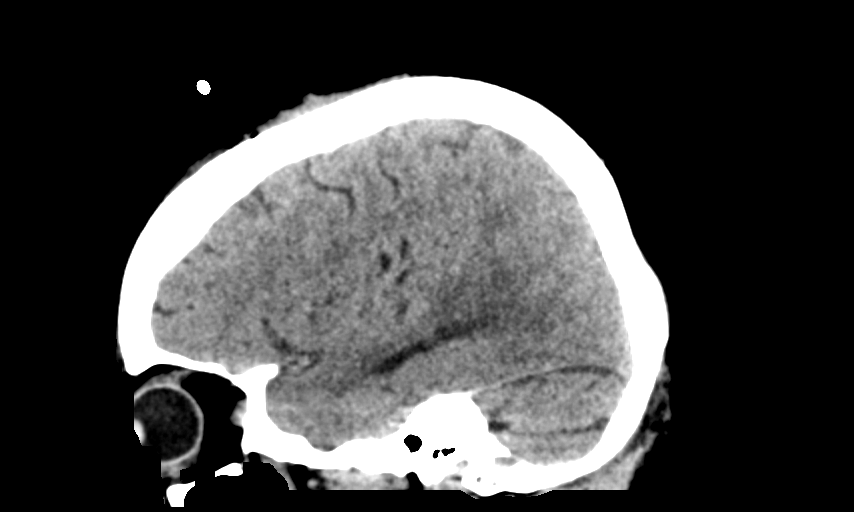
[im 34/67  brain]
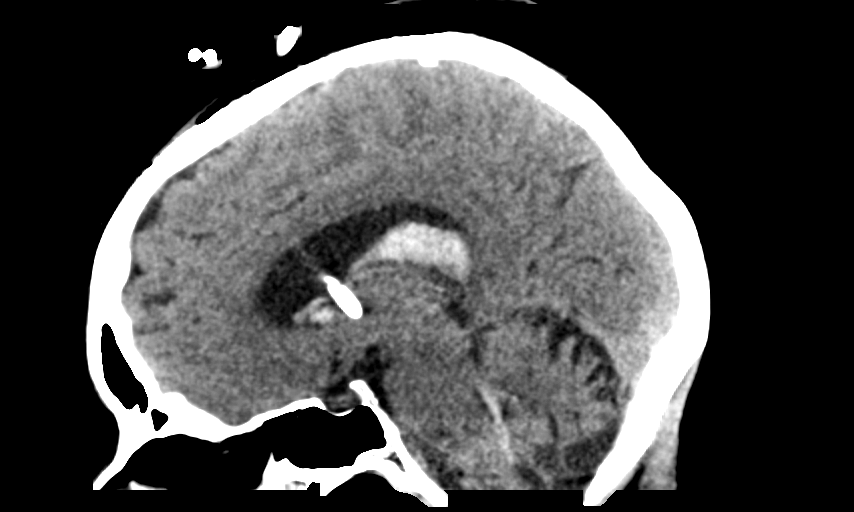
[im 45/67  brain]
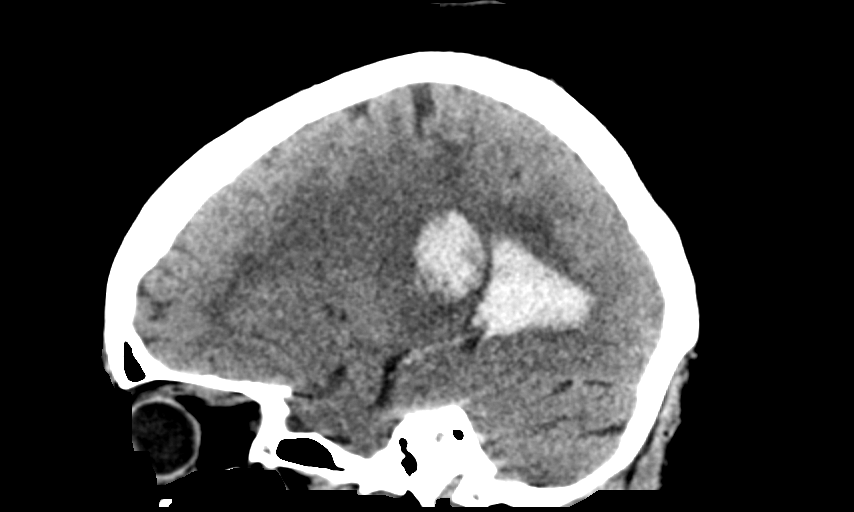

[13 of 47 positions shown; findings below may reference images not displayed]

FINDINGS: Brain: No significant interval change size of the left thalamic
hemorrhage with hematoma now measuring approximately 3.5 x 3.3 x 2
cm compared to 3.3 x 3.3 x 2.2 cm on prior. Persistent hypodensity
surrounding the hematoma related to vasogenic edema.

Supratentorial and infratentorial intraventricular hemorrhage
related to hematoma decompression into the right lateral ventricle,
status post right frontal approach ventricular drain placement.
Drain tip is at level of the right foramen of VEJAR. There is mild
interval decrease in size of the supratentorial ventricles,
particularly the temporal horns.

Patchy hypodensity of the white matter of the cerebral hemispheres,
nonspecific most likely related to small vessel ischemia. Remote
lacunar infarct in the right radiata.

Vascular: No hyperdense vessel or unexpected calcification.

Skull: Negative for fracture or focal lesion.

Sinuses/Orbits: No acute finding.

Other: None.
IMPRESSION: 1. Interval placement of a right frontal approach ventricular drain
with the tip at the level of the right foramen of VEJAR. Interval
mild decrease in size of the ventricular system, particularly the
temporal horns of the lateral ventricles.
2. Stable appearance of left thalamic intraparenchymal hemorrhage
with intraventricular extension.

## 2021-05-15 IMAGING — DX DG ABD PORTABLE 1V
1 series · 1 of 1 positions shown · non-contrast
Comparison: None.

CLINICAL DATA: Feeding tube placement.

EXAM:
PORTABLE ABDOMEN - 1 VIEW

[abdomen supine]
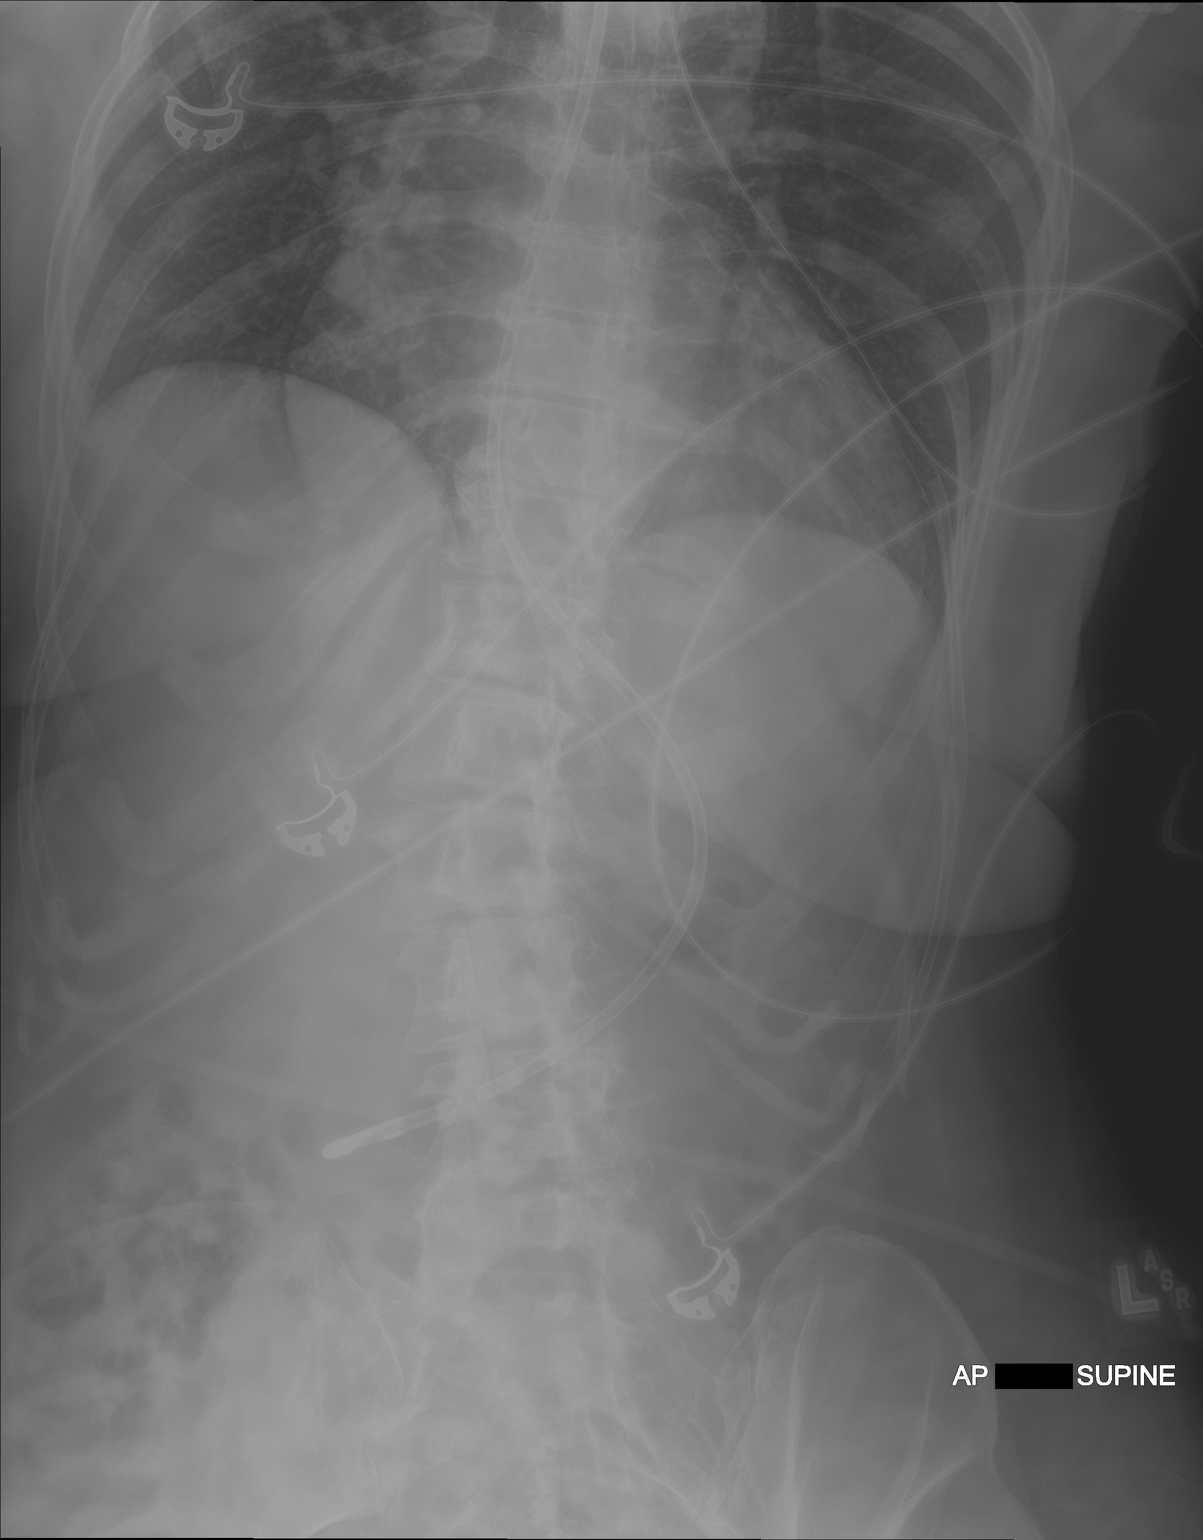

[1 of 1 positions shown; findings below may reference images not displayed]

FINDINGS: Feeding tube is in place with the tip in the distal stomach directed
toward the duodenum.
IMPRESSION: As above.

## 2021-05-15 MED ORDER — SENNOSIDES-DOCUSATE SODIUM 8.6-50 MG PO TABS
1.0000 | ORAL_TABLET | Freq: Two times a day (BID) | ORAL | Status: DC
  Administered 2021-05-16 – 2021-05-27 (×16): 1
  Filled 2021-05-15 (×16): qty 1

## 2021-05-15 MED ORDER — CARVEDILOL 12.5 MG PO TABS
12.5000 mg | ORAL_TABLET | Freq: Two times a day (BID) | ORAL | Status: DC
  Administered 2021-05-15 – 2021-05-19 (×8): 12.5 mg
  Filled 2021-05-15 (×8): qty 1

## 2021-05-15 MED ORDER — LABETALOL HCL 5 MG/ML IV SOLN
5.0000 mg | INTRAVENOUS | Status: DC | PRN
  Administered 2021-05-16 (×2): 20 mg via INTRAVENOUS
  Administered 2021-05-18: 10 mg via INTRAVENOUS
  Administered 2021-05-18 – 2021-05-19 (×4): 20 mg via INTRAVENOUS
  Filled 2021-05-15 (×8): qty 4

## 2021-05-15 MED ORDER — VITAL HIGH PROTEIN PO LIQD: 1000.0000 mL | ORAL | Status: DC

## 2021-05-15 MED ORDER — LISINOPRIL 20 MG PO TABS
20.0000 mg | ORAL_TABLET | Freq: Every day | ORAL | Status: DC
  Administered 2021-05-15 – 2021-05-19 (×5): 20 mg
  Filled 2021-05-15 (×4): qty 1

## 2021-05-15 MED ORDER — HYDROCHLOROTHIAZIDE 12.5 MG PO CAPS: 12.5000 mg | ORAL_CAPSULE | Freq: Every day | ORAL | Status: DC

## 2021-05-15 MED ORDER — PROSOURCE TF PO LIQD
45.0000 mL | Freq: Two times a day (BID) | ORAL | Status: DC
  Administered 2021-05-15 – 2021-05-27 (×25): 45 mL
  Filled 2021-05-15 (×24): qty 45

## 2021-05-15 MED ORDER — METOPROLOL TARTRATE 5 MG/5ML IV SOLN: 5.0000 mg | Freq: Once | INTRAVENOUS | Status: AC

## 2021-05-15 MED ORDER — OSMOLITE 1.2 CAL PO LIQD
1000.0000 mL | ORAL | Status: DC
  Administered 2021-05-15 – 2021-05-27 (×13): 1000 mL
  Filled 2021-05-15 (×3): qty 1000

## 2021-05-15 MED ORDER — METOPROLOL TARTRATE 5 MG/5ML IV SOLN
INTRAVENOUS | Status: AC
  Administered 2021-05-15: 5 mg via INTRAVENOUS
  Filled 2021-05-15: qty 5

## 2021-05-15 MED ORDER — HYDROCHLOROTHIAZIDE 10 MG/ML ORAL SUSPENSION
12.5000 mg | Freq: Every day | ORAL | Status: DC
  Administered 2021-05-15 – 2021-05-20 (×6): 13 mg
  Filled 2021-05-15 (×6): qty 1.88

## 2021-05-15 NOTE — Progress Notes (Signed)
Subjective: Patient reports no headache  Objective: Vital signs in last 24 hours: Temp:  [98.2 F (36.8 C)-101 F (38.3 C)] 98.5 F (36.9 C) (06/03 1200) Pulse Rate:  [77-94] 80 (06/03 1200) Resp:  [12-25] 18 (06/03 1200) BP: (124-150)/(67-103) 145/73 (06/03 1200) SpO2:  [87 %-100 %] 95 % (06/03 1200)  Intake/Output from previous day: 06/02 0701 - 06/03 0700 In: 1334.3 [I.V.:1234.3; IV Piggyback:100] Out: 1600 [Urine:1500; Drains:100] Intake/Output this shift: Total I/O In: 434.7 [I.V.:334.7; IV Piggyback:100] Out: 89 [Drains:89]  Eyes open to voice, PERRL FC on left with prodding but minimally verbal CSF red-tinged  Lab Results: Recent Labs    05/14/21 0119  WBC 12.8*  HGB 13.6  HCT 42.9  PLT 192   BMET Recent Labs    05/14/21 0119  NA 141  K 3.5  CL 105  CO2 27  GLUCOSE 117*  BUN 10  CREATININE 0.83  CALCIUM 8.8*    Studies/Results: DG Abd 1 View  Result Date: 05/14/2021 CLINICAL DATA:  60 year old female with intracranial hemorrhage. EXAM: ABDOMEN - 1 VIEW COMPARISON:  Portable pelvis radiograph today reported separately. FINDINGS: Portable AP supine view at 0344 hours. Non obstructed bowel gas pattern. No definite pneumoperitoneum on this supine view. Minimally included left lung base appears grossly negative. Scoliosis and degenerative changes in the spine. Pelvic dystrophic and/or vascular calcifications, mild. No acute osseous abnormality identified. IMPRESSION: 1. Normal bowel gas pattern. 2. Spinal scoliosis and degeneration. Electronically Signed   By: Odessa Fleming M.D.   On: 05/14/2021 06:19   CT HEAD WO CONTRAST  Result Date: 05/15/2021 CLINICAL DATA:  Cerebral hemorrhage suspected. EXAM: CT HEAD WITHOUT CONTRAST TECHNIQUE: Contiguous axial images were obtained from the base of the skull through the vertex without intravenous contrast. COMPARISON:  MRI and CT of the brain May 14, 2021. FINDINGS: Brain: No significant interval change size of the left  thalamic hemorrhage with hematoma now measuring approximately 3.5 x 3.3 x 2 cm compared to 3.3 x 3.3 x 2.2 cm on prior. Persistent hypodensity surrounding the hematoma related to vasogenic edema. Supratentorial and infratentorial intraventricular hemorrhage related to hematoma decompression into the right lateral ventricle, status post right frontal approach ventricular drain placement. Drain tip is at level of the right foramen of Monro. There is mild interval decrease in size of the supratentorial ventricles, particularly the temporal horns. Patchy hypodensity of the white matter of the cerebral hemispheres, nonspecific most likely related to small vessel ischemia. Remote lacunar infarct in the right radiata. Vascular: No hyperdense vessel or unexpected calcification. Skull: Negative for fracture or focal lesion. Sinuses/Orbits: No acute finding. Other: None. IMPRESSION: 1. Interval placement of a right frontal approach ventricular drain with the tip at the level of the right foramen of Monro. Interval mild decrease in size of the ventricular system, particularly the temporal horns of the lateral ventricles. 2. Stable appearance of left thalamic intraparenchymal hemorrhage with intraventricular extension. Electronically Signed   By: Baldemar Lenis M.D.   On: 05/15/2021 09:41   CT HEAD WO CONTRAST  Result Date: 05/14/2021 CLINICAL DATA:  Intraparenchymal hemorrhage.  Follow-up. EXAM: CT HEAD WITHOUT CONTRAST TECHNIQUE: Contiguous axial images were obtained from the base of the skull through the vertex without intravenous contrast. COMPARISON:  Same day CT head. FINDINGS: Mildly motion limited exam.  Within this limitation: Brain: When accounting for differences in technique, similar size of the acute intraparenchymal hemorrhage centered in the left thalamus which measures up to 3.3 x 2.2 x 3.3 cm (  similar when remeasured on the prior). Similar regional mass effect. Similar intraventricular  hemorrhage within the left greater than right lateral ventricles, third ventricle, and fourth ventricle. Similar ventriculomegaly with mild periventricular edema and prominence of the temporal horns. Additional scattered white matter hypoattenuation is nonspecific but most likely related to chronic microvascular ischemic disease. No evidence of acute large vascular territory infarct. Similar suspected prior infarct in the right corona radiata. Vascular: No hyperdense vessel identified. Calcific atherosclerosis. Skull: No acute fracture. Sinuses/Orbits: No acute findings. Other: No mastoid effusions. IMPRESSION: When accounting for differences in technique, similar size of the acute left thalamic intraparenchymal hemorrhage. Similar intraventricular extension of hemorrhage and ventriculomegaly. Electronically Signed   By: Feliberto HartsFrederick S Jones MD   On: 05/14/2021 06:21   CT HEAD WO CONTRAST  Result Date: 05/14/2021 CLINICAL DATA:  Intraparenchymal hemorrhage. [place] EXAM: CT HEAD WITHOUT CONTRAST TECHNIQUE: Contiguous axial images were obtained from the base of the skull through the vertex without intravenous contrast. COMPARISON:  None. I am not able to view the outside institution scans. FINDINGS: Brain: Intraparenchymal hematoma centered in the left thalamus measures 3.2 x 2.2 x 3.0 cm (volume = 11 cm^3). There is moderate hydrocephalus with periventricular hypoattenuation suggesting transependymal interstitial edema. There is rightward bulging of the septum pellucidum. Large amount of blood in the ventricles. Vascular: No abnormal hyperdensity of the major intracranial arteries or dural venous sinuses. No intracranial atherosclerosis. Skull: The visualized skull base, calvarium and extracranial soft tissues are normal. Sinuses/Orbits: No fluid levels or advanced mucosal thickening of the visualized paranasal sinuses. No mastoid or middle ear effusion. The orbits  are normal. IMPRESSION: 1. Intraparenchymal hematoma centered in the left thalamus with intraventricular extension and moderate hydrocephalus with transependymal interstitial edema. Critical Value/emergent results were called by telephone at the time of interpretation on 05/14/2021 at 12:58 am to provider [facility] , who verbally acknowledged these results. Electronically Signed   By: Deatra RobinsonKevin  Herman M.D.   On: 05/14/2021 00:58   MR MRA HEAD WO CONTRAST  Result Date: 05/14/2021 CLINICAL DATA:  Intracranial hemorrhage EXAM: MRI HEAD WITHOUT CONTRAST MRA HEAD WITHOUT CONTRAST TECHNIQUE: Multiplanar, multi-echo pulse sequences of the brain and surrounding structures were acquired without intravenous contrast. Angiographic images of the Circle of Willis were acquired using MRA technique without intravenous contrast. COMPARISON: No pertinent prior exam. COMPARISON:  Correlation made with prior head CT FINDINGS: MRI HEAD Brain: Left thalamic hemorrhage is again identified similar to the recent CT with acute blood products. There is mild surrounding edema. Significant intraventricular extension is present as seen on CT with resulting hydrocephalus. Focus susceptibility in the right corona radiata reflects chronic microhemorrhage associated with a lacunar infarct. Additional patchy and confluent areas of T2 hyperintensity in the supratentorial white matter are nonspecific but probably reflect moderate chronic microvascular ischemic changes. No post contrast imaging performed therefore potential underlying lesion is not well evaluated. Vascular: Major vessel flow voids at the skull base are preserved. Skull and upper cervical spine: Normal marrow signal is preserved. Sinuses/Orbits: Paranasal sinuses are aerated. Orbits are unremarkable. Other: Sella is unremarkable.  Mastoid air cells are clear. MRA HEAD Intracranial internal carotid arteries are patent. Middle and anterior cerebral arteries are patent. Intracranial  vertebral arteries, basilar artery, posterior cerebral arteries are patent. There is no significant stenosis or aneurysm. No abnormal flow related enhancement in the region of thalamic hemorrhage. IMPRESSION: No substantial change in left thalamic hemorrhage with significant intraventricular extension and hydrocephalus as well  as mild edema and mass effect compared to recent CT. 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 Moderate chronic microvascular ischemic changes. Unremarkable MRA. Electronically Signed   By: Guadlupe Spanish M.D.   On: 05/14/2021 11:18   MR BRAIN WO CONTRAST  Result Date: 05/14/2021 CLINICAL DATA:  Intracranial hemorrhage EXAM: MRI HEAD WITHOUT CONTRAST MRA HEAD WITHOUT CONTRAST TECHNIQUE: Multiplanar, multi-echo pulse sequences of the brain and surrounding structures were acquired without intravenous contrast. Angiographic images of the Circle of Willis were acquired using MRA technique without intravenous contrast. COMPARISON: No pertinent prior exam. COMPARISON:  Correlation made with prior head CT FINDINGS: MRI HEAD Brain: Left thalamic hemorrhage is again identified similar to the recent CT with acute blood products. There is mild surrounding edema. Significant intraventricular extension is present as seen on CT with resulting hydrocephalus. Focus susceptibility in the right corona radiata reflects chronic microhemorrhage associated with a lacunar infarct. Additional patchy and confluent areas of T2 hyperintensity in the supratentorial white matter are nonspecific but probably reflect moderate chronic microvascular ischemic changes. No post contrast imaging performed therefore potential underlying lesion is not well evaluated. Vascular: Major vessel flow voids at the skull base are preserved. Skull and upper cervical spine: Normal marrow signal is preserved. Sinuses/Orbits: Paranasal sinuses are aerated. Orbits are unremarkable. Other: Sella is  unremarkable.  Mastoid air cells are clear. MRA HEAD Intracranial internal carotid arteries are patent. Middle and anterior cerebral arteries are patent. Intracranial vertebral arteries, basilar artery, posterior cerebral arteries are patent. There is no significant stenosis or aneurysm. No abnormal flow related enhancement in the region of thalamic hemorrhage. IMPRESSION: No substantial change in left thalamic hemorrhage with significant intraventricular extension and hydrocephalus as well as mild edema and mass effect compared to recent CT. 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 Moderate chronic microvascular ischemic changes. Unremarkable MRA. Electronically Signed   By: Guadlupe Spanish M.D.   On: 05/14/2021 11:18   DG Pelvis Portable  Result Date: 05/14/2021 CLINICAL DATA:  60 year old female with intracranial hemorrhage. EXAM: PORTABLE PELVIS 1-2 VIEWS COMPARISON:  None. FINDINGS: Portable AP view at 0346 hours. Mild likely fibroid associated dystrophic calcifications in the pelvis eccentric to the right. Femoral heads normally located. Pelvis and proximal femurs appear intact. No acute osseous abnormality identified. Negative visible bowel gas pattern. IMPRESSION: Negative. Electronically Signed   By: Odessa Fleming M.D.   On: 05/14/2021 06:18   DG CHEST PORT 1 VIEW  Result Date: 05/14/2021 CLINICAL DATA:  Provided history: Follow-up. EXAM: PORTABLE CHEST 1 VIEW COMPARISON:  None. FINDINGS: Cardiomediastinal silhouette is enlarged, likely accentuated by AP technique. Calcific atherosclerosis of the aorta. Low lung volumes. No consolidation. EKG leads bilaterally. No visible pleural effusions or pneumothorax on this single semi erect AP radiograph. IMPRESSION: 1. Low lung volumes without evidence of acute cardiopulmonary disease. 2. Cardiomegaly. Electronically Signed   By: Feliberto Harts MD   On: 05/14/2021 06:13   DG Abd Portable 1V  Result Date:  05/15/2021 CLINICAL DATA:  Feeding tube placement. EXAM: PORTABLE ABDOMEN - 1 VIEW COMPARISON:  None. FINDINGS: Feeding tube is in place with the tip in the distal stomach directed toward the duodenum. IMPRESSION: As above. Electronically Signed   By: Drusilla Kanner M.D.   On: 05/15/2021 11:40   VAS US CAROTID  Result Date: 05/14/2021 Carotid Arterial Duplex Study Patient Name:  Marissa Morris  Date of Exam:   05/14/2021 Medical Rec #: [id number]  Accession #:    [id number] Date of Birth: 08/29/1960     Patient Gender: F Patient Age:   60Y Exam Location:  [facility] Procedure:      VAS US CAROTID Referring Phys: [id number] Marvel Plan --------------------------------------------------------------------------------  Indications:       Hypertensive emergency (122/128). 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 Risk Factors:      Hypertension. Comparison Study:  No prior study on file Performing Technologist: Sherren Kerns RVS  Examination Guidelines: A complete evaluation includes B-mode imaging, spectral Doppler, color Doppler, and power Doppler as needed of all accessible portions of each vessel. Bilateral testing is considered an integral part of a complete examination. Limited examinations for reoccurring indications may be performed as noted.  Right Carotid Findings: +----------+--------+--------+--------+------------------+--------+           PSV cm/sEDV cm/sStenosisPlaque DescriptionComments +----------+--------+--------+--------+------------------+--------+ CCA Prox  96      9                                          +----------+--------+--------+--------+------------------+--------+ CCA Distal104     13                                         +----------+--------+--------+--------+------------------+--------+ ICA Prox  66      15                                          +----------+--------+--------+--------+------------------+--------+ ICA Distal54      13                                         +----------+--------+--------+--------+------------------+--------+ ECA       151     22                                         +----------+--------+--------+--------+------------------+--------+ +----------+--------+-------+--------+-------------------+           PSV cm/sEDV cmsDescribeArm Pressure (mmHG) +----------+--------+-------+--------+-------------------+ Subclavian150                                        +----------+--------+-------+--------+-------------------+ +---------+--------+--+--------+--+ VertebralPSV cm/s41EDV cm/s12 +---------+--------+--+--------+--+  Left Carotid Findings: +----------+--------+--------+--------+------------------+--------+           PSV cm/sEDV cm/sStenosisPlaque DescriptionComments +----------+--------+--------+--------+------------------+--------+ CCA Prox  180     15                                         +----------+--------+--------+--------+------------------+--------+ CCA Distal98      13                                         +----------+--------+--------+--------+------------------+--------+  ICA Prox  70      22                                         +----------+--------+--------+--------+------------------+--------+ ICA Distal68      22                                         +----------+--------+--------+--------+------------------+--------+ ECA       91      18                                         +----------+--------+--------+--------+------------------+--------+ +----------+--------+--------+--------+-------------------+           PSV cm/sEDV cm/sDescribeArm Pressure (mmHG) +----------+--------+--------+--------+-------------------+ [id number]                                           +----------+--------+--------+--------+-------------------+ +---------+--------+--+--------+--+ VertebralPSV cm/s61EDV cm/s13 +---------+--------+--+--------+--+      Preliminary     Assessment/Plan: L thalamic hemorrhage with IVH - cont EVD open at 10 cm - likely 5-7 days of drainage prior to weaning   Bedelia Person 05/15/2021, 2:21 PM

## 2021-05-15 NOTE — Progress Notes (Signed)
OT Cancellation Note  Patient Details Name: Marissa Morris MRN: [id number] DOB: February 20, 1961   Cancelled Treatment:    Reason Eval/Treat Not Completed: Other (comment) (MD requesting to hol until repeat CT completed. Will assess when medically ready.)  [facility]  Luisa Dago, OT/L   Acute OT Clinical Specialist Acute Rehabilitation Services Pager [phone] Office [phone]  05/15/2021, 8:32 AM

## 2021-05-15 NOTE — Progress Notes (Signed)
Initial Nutrition Assessment  DOCUMENTATION CODES:   Not applicable  INTERVENTION:   Initiate tube feeding via Cortrak: Osmolite 1.2 at 60 ml/h (1440 ml per day) Prosource TF 45 ml BID  Provides 1808 kcal, 101 gm protein, 1167 ml free water daily   NUTRITION DIAGNOSIS:   Inadequate oral intake related to inability to eat as evidenced by NPO status.  GOAL:   Patient will meet greater than or equal to 90% of their needs  MONITOR:   Diet advancement,TF tolerance  REASON FOR ASSESSMENT:   Consult Enteral/tube feeding initiation and management  ASSESSMENT:   Pt with PMH of HTN and HLD admitted with left thalamic ICH with extensive IVH secondary to hypertensive emergency.    Pt discussed during ICU rounds and with RN.  No family present. Pt able to participate in exam but does not open eyes.   6/3 failed swallow, s/p cortrak placement with tip gastric    Medications reviewed and include: senokot-s Cleviprex @ 48 ml/hr providing: 2304 kcal  Labs reviewed: glucose: 107   NUTRITION - FOCUSED PHYSICAL EXAM:  Flowsheet Row Most Recent Value  Orbital Region No depletion  Upper Arm Region No depletion  Thoracic and Lumbar Region No depletion  Buccal Region No depletion  Temple Region No depletion  Clavicle Bone Region No depletion  Clavicle and Acromion Bone Region No depletion  Scapular Bone Region No depletion  Dorsal Hand No depletion  Patellar Region No depletion  Anterior Thigh Region No depletion  Posterior Calf Region No depletion  Edema (RD Assessment) None  Hair Reviewed  Eyes Reviewed  Mouth Reviewed  Skin Reviewed  Nails Reviewed       Diet Order:   Diet Order            Diet NPO time specified  Diet effective now                 EDUCATION NEEDS:   No education needs have been identified at this time  Skin:  Skin Assessment: Reviewed RN Assessment  Last BM:  unknown  Height:   Ht Readings from Last 1 Encounters:  No data found  for Ht    Weight:   Wt Readings from Last 1 Encounters:  05/13/21 75.3 kg    Ideal Body Weight:     BMI:  There is no height or weight on file to calculate BMI.  Estimated Nutritional Needs:   Kcal:  1800-2000  Protein:  90-105 grams  Fluid:  >1.8 L/day  Cammy Copa., RD, LDN, CNSC See AMiON for contact information

## 2021-05-15 NOTE — Progress Notes (Signed)
RN Kaitlin hand over back pt for further care and management

## 2021-05-15 NOTE — Procedures (Signed)
Cortrak  Person Inserting Tube:  Osa Craver, RD Tube Type:  Cortrak - 43 inches Tube Location:  Right nare Initial Placement:  Stomach Secured by: Bridle Technique Used to Measure Tube Placement:  Documented cm marking at nare/ corner of mouth Cortrak Secured At:  65 cm    Cortrak Tube Team Note:  Consult received to place a Cortrak feeding tube.   X-ray is required, abdominal x-ray has been ordered by the Cortrak team. Please confirm tube placement before using the Cortrak tube.   If the tube becomes dislodged please keep the tube and contact the Cortrak team at [email or web address] ([email or web address]) for replacement.  If after hours and replacement cannot be delayed, place a NG tube and confirm placement with an abdominal x-ray.    Romelle Starcher MS, RDN, LDN, CNSC Registered Dietitian III Clinical Nutrition RD Pager and On-Call Pager Number Located in [place]

## 2021-05-15 NOTE — Progress Notes (Signed)
NAMEZola Morris, MRN:  [id number], DOB:  [birth year], LOS: 1 ADMISSION DATE:  05/13/2021, CONSULTATION DATE:  05/14/21 REFERRING MD:  , CHIEF COMPLAINT:   intracranial hemorrhage  HPI/course in hospital  Marissa Morris a 60 y.o.femalepresenting [facility]  after transfer from [facility] [phone]) for management of acute intracranial hemorrhage.  Patient was very somnolent and unable to answer questions coherently.  Per notes and records from [place], EMS had been called to the patient's home and on arrival the patient was found to be slumped over in a chair. Sheprojectilevomited and thenwas noted to beflaccid on the right with right facial droop and inability to correctly answer questions On arrival to the ED, BP was 222/128. CT head revealed a 3 cm thalamic hemorrhage with intraventricular extension.  Initial NIHSS was 25. Initial exam by EDP at OSH: Patient awake with slow but comprehensible speech. Oriented to person. Confused. CN were normal. LUE and LLE 5/5, RUE and RLE with flaccid paralysis. Follow up NIHSS was 17.  Collateral: Marissa Morris [phone]); patient sister, for more formation.  Yesterday, patient was watching television boyfriend.  She states that her sister slumped over in her chair and was unable to answer questions.  She reports that the boyfriend was unable to understand what she was saying.  Patient then started vomiting and EMS was called.  Patient's sister does not know her complete medical history, but states she has a history of hypertension, hyperlipidemia and thyroid dysfunction; she was unsure patient has hypothyroidism or hyperthyroidism.  She believes that her sister is a patient at [facility] in [place]. Called [facility]; she last say them in Oct 2019. They were unable to given current information about pmh and home medications.   Past Medical History  PMH of hypertension,  hyperlipidemia and thyroid dysfunction (unclear if hypothyroid or hypothyroid)  Review of Systems:   Review of Systems  Unable to perform ROS: Mental status change    Social History     Unable to get complete history from patient due to her mental status. Per sister, patient is lives with boyfriend. She was not aware of patient Etoh or substance use.  Family History   Her family history is not on file.   Allergies Allergies  Allergen Reactions  . Other Other (See Comments) and Cough    Pollen- Sneezing, also     Home Medications  Prior to Admission medications   Medication Sig Start Date End Date Taking? Authorizing Provider  BAYER LOW DOSE 81 MG EC tablet Take 81 mg by mouth daily. Swallow whole.   Yes [provider]  Multiple Vitamins-Minerals (ONE-A-DAY WOMENS 50+) TABS Take 1 tablet by mouth daily.   Yes [provider]  vitamin C (ASCORBIC ACID) 500 MG tablet Take 500-1,000 mg by mouth daily.   Yes [provider]     Interim history/subjective:  Patient was febrile overnight with T-max of 100.7.  Also overnight, patient was exceeding goal systolic blood pressures of 140.  Or hypertensive's were added to patient's medication regimen lisinopril 20 mg and labetalol 20 mg.  Although patient was not tachycardic; she has had an elevated heart rate in the low to mid 90s.  Neurologically, patient is oriented to self and has limited ability to answer verbal questions.  She is still somnolent. patient knows her name, but when asked other questions she does not make sense with her answers.  Patient is responsive to noxious stimulus; she  will withdraw to stimulus in all 4 extremities.  Patient is able to follow verbal commands with her left side being greater than her right.  LUE and LLE 4/5 and RUE and RLE 2/5. Purposeful movement limited on the right side.  Cortrak placed today   Objective   Blood pressure (!) 141/89, pulse 91, temperature 98.2 F (36.8  C), temperature source Axillary, resp. rate 16, weight 75.3 kg, SpO2 97 %.        Intake/Output Summary (Last 24 hours) at 05/15/2021 1214 Last data filed at 05/15/2021 1000 Gross per 24 hour  Intake 1465.37 ml  Output 1420 ml  Net 45.37 ml   Filed Weights   05/13/21 2256  Weight: 75.3 kg    Examination: Physical Exam Constitutional:      General: She is in acute distress.     Appearance: She is normal weight. She is ill-appearing.  HENT:     Head: Normocephalic.     Mouth/Throat:     Mouth: Mucous membranes are moist.     Pharynx: Oropharynx is clear. No oropharyngeal exudate.  Eyes:     General: No scleral icterus.    Pupils: Pupils are equal, round, and reactive to light.     Comments: Left-sided deviation of eyes  Cardiovascular:     Rate and Rhythm: Normal rate and regular rhythm.     Heart sounds: No murmur heard. No friction rub. No gallop.   Pulmonary:     Effort: Pulmonary effort is normal.     Breath sounds: Normal breath sounds. No wheezing, rhonchi or rales.  Abdominal:     General: Bowel sounds are normal.     Palpations: Abdomen is soft.  Musculoskeletal:     Cervical back: Normal range of motion and neck supple. No rigidity.  Skin:    General: Skin is warm.     Coloration: Skin is not jaundiced.  Neurological:     Mental Status: She is lethargic and disoriented.     Motor: Weakness (Strength is greater in left upper and lower extremities when compared to the right side) and abnormal muscle tone (LUE and LLE normal tone.  Decreased tone in RUE and RLE.) present.      Ancillary tests (personally reviewed)  CBC: Recent Labs  Lab 05/14/21 0119  WBC 12.8*  HGB 13.6  HCT 42.9  MCV 99.8  PLT 192    Basic Metabolic Panel: Recent Labs  Lab 05/14/21 0119  NA 141  K 3.5  CL 105  CO2 27  GLUCOSE 117*  BUN 10  CREATININE 0.83  CALCIUM 8.8*   GFR: CrCl cannot be calculated (Unknown ideal weight.). Recent Labs  Lab 05/14/21 0119  WBC 12.8*     Liver Function Tests: Recent Labs  Lab 05/14/21 0119  AST 33  ALT 23  ALKPHOS 84  BILITOT 0.7  PROT 7.6  ALBUMIN 4.1   No results for input(s): LIPASE, AMYLASE in the last 168 hours. No results for input(s): AMMONIA in the last 168 hours.  ABG No results found for: PHART, PCO2ART, PO2ART, HCO3, TCO2, ACIDBASEDEF, O2SAT   Coagulation Profile: Recent Labs  Lab 05/14/21 0119  INR 1.0    Cardiac Enzymes: No results for input(s): CKTOTAL, CKMB, CKMBINDEX, TROPONINI in the last 168 hours.  HbA1C: Hgb A1c MFr Bld  Date/Time Value Ref Range Status  05/14/2021 12:23 AM 5.7 (H) 4.8 - 5.6 % Final    Comment:    (NOTE)  Prediabetes: 5.7 - 6.4         Diabetes: >6.4         Glycemic control for adults with diabetes: <7.0     CBG: Recent Labs  Lab 05/14/21 0818  GLUCAP 107*     Assessment & Plan:  Ms.Kamariyah Morris a 60 y.o.female with a past medical history, reported from sister, of hypertension and hyperlipidemia admitted for N/V, right sided weakness and facial droop in the setting of intracranial hemorrhage with 3 cm thalamic hemorrhage with intraventricular extension.  ICH with KGM:[id number] ICH with extensive IVH -secondary tohypertensive emergency Hydrocephalus   At Admission, CT head left thalamic ICH with IVH  Repeat CT head 6/3- stable ICH and ventricles  MRIStable ICH and ventricles  MRAUnremarkable.  S/p EVD 6/2  On keppra 500mg  bid for seizure prophylaxis  Carotid Doppler Pending   Echopending   Elevated heart rate (not tachycardic) could be due to to elevated intracranial pressures and sympathetic activation.  Metoprolol added to patient's regimen.  Hyperlipidemia  LDL121, goal < 70  Consider statin   Dysphagia   Did not pass swallow  Cortrak placed 6/3  Patient is not intubated, but may need it in the future given dysphagia and increased aspiration risk.   Hypothyroidism or Hyperthyroidism? -  reported hx of thyroid dysfunction  -  free T4, free T3 ordered    Leukocytosis  - WBC of 12.8  - no clear infection source. Currently afebrile.   Prediabetes - No Known Hx of DM. - glucose currently 107 - glucose < 200  - HbA1C 5.7   Daily Goals Checklist  Diet: Cortrak placed 6/3 Pain/Anxiety/Delirium protocol (if indicated): no VAP protocol (if indicated): No Respiratory support goals: N/A Blood pressure target: < 140 DVT prophylaxis: SCD  GI prophylaxis: PPI Urinary catheter: n/a Central lines: N/A Mobility:bed rest Glucose control: No Code Status: Full Code Family Communication: Sister - [name] Disposition: ICU  Critical care time:      8/3 05/15/2021, 12:14 PM

## 2021-05-15 NOTE — Progress Notes (Signed)
PT Cancellation Note  Patient Details Name: Marissa Morris MRN: [id number] DOB: Sep 04, 1961   Cancelled Treatment:      (MD requesting to hold until repeat CT completed. Will assess when medically ready.)  Anise Salvo, PT Acute Rehab Services Pager [phone] [facility] [phone]   Rayetta Humphrey 05/15/2021, 10:04 AM

## 2021-05-15 NOTE — Evaluation (Signed)
Clinical/Bedside Swallow Evaluation Patient Details  Name: Marissa Morris MRN: [id number] Date of Birth: [birth year]  Today's Date: 05/15/2021 Time: SLP Start Time (ACUTE ONLY): 1212 SLP Stop Time (ACUTE ONLY): 1222 SLP Time Calculation (min) (ACUTE ONLY): 10 min  Past Medical History: History reviewed. No pertinent past medical history. Past Surgical History: History reviewed. No pertinent surgical history. HPI:  Marissa Morris is a 60 y.o. female presenting to [facility] in transfer from [facility] for management of acute intracranial hemorrhage. 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 Moderate chronic microvascular ischemic changes. No PMH on file.   Assessment / Plan / Recommendation Clinical Impression  Pt's alertness is not adequate to initiate liquids or solids. Her wakefulness has been variable and received ventriculostomy placement yesterday. Increased verbal responses marked by low intensity and decreased intelligibility. Right facial weakness and asymmetry present. She did not swallow or cough on command impacted by general decreased endurance and sleepiness. Delayed transit of ice chips; teaspoon water was transited timelier. Noted slight inhalation after she swallowed and question coordination of swallow mechanism. Cortrack in place and ST will follow and offer recommendations as appropriate. When able to sustain alertness, can have several ice chips after oral care and full supervision. SLP Visit Diagnosis: Dysphagia, unspecified (R13.10)    Aspiration Risk  Moderate aspiration risk;Mild aspiration risk    Diet Recommendation NPO   Medication Administration: Via alternative means    Other  Recommendations Oral Care Recommendations: Oral care QID    Follow up Recommendations Inpatient Rehab      Frequency and Duration min 2x/week  2 weeks       Prognosis Prognosis for Safe Diet Advancement: Good      Swallow Study   General Date of Onset: 05/14/21 HPI: Marissa Morris is a 60 y.o. female presenting to [facility] in transfer from [facility] for management of acute intracranial hemorrhage. 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 Moderate chronic microvascular ischemic changes. No PMH on file. Type of Study: Bedside Swallow Evaluation Previous Swallow Assessment:  (none) Diet Prior to this Study: NPO;NG Tube Temperature Spikes Noted: No Respiratory Status: Nasal cannula History of Recent Intubation: No Behavior/Cognition: Lethargic/Drowsy;Requires cueing Oral Cavity Assessment: Within Functional Limits Oral Care Completed by SLP: Recent completion by staff Oral Cavity - Dentition: Adequate natural dentition Vision:  (right inattention) Self-Feeding Abilities: Total assist Patient Positioning: Upright in bed Baseline Vocal Quality: Low vocal intensity Volitional Cough:  (unable due to lethargy) Volitional Swallow: Unable to elicit    Oral/Motor/Sensory Function Overall Oral Motor/Sensory Function: Mild impairment Facial ROM: Reduced right;Suspected CN VII (facial) dysfunction Facial Symmetry: Abnormal symmetry right;Suspected CN VII (facial) dysfunction Facial Strength: Reduced right;Suspected CN VII (facial) dysfunction Lingual ROM:  (unable to lateralize)   Ice Chips Ice chips: Impaired Oral Phase Impairments: Reduced labial seal;Reduced lingual movement/coordination Oral Phase Functional Implications: Prolonged oral transit Pharyngeal Phase Impairments: Suspected delayed Swallow    Thin Liquid Thin Liquid: Impaired Presentation: Spoon Oral Phase Impairments: Reduced labial seal;Reduced lingual movement/coordination;Poor awareness of bolus Oral Phase Functional Implications: Right anterior spillage Pharyngeal  Phase Impairments:  (inhalation after swallow?)  Nectar Thick Nectar Thick Liquid: Not tested   Honey Thick Honey Thick Liquid: Not tested   Puree Puree: Not tested   Solid     Solid: Not tested      Royce Macadamia 05/15/2021,2:05 PM Breck Coons Lonell Face.Ed Nurse, children's [phone] Office [phone]

## 2021-05-15 NOTE — Progress Notes (Addendum)
STROKE TEAM PROGRESS NOTE   SUBJECTIVE (INTERVAL HISTORY) No family is at the bedside. Pt lying in bed, had EVD done yesterday, draining well. Repeat CT this am showed mildly decreased ventricle size, stable hematoma. Pt neurologically unchanged except seems less sleepy. Still has left gaze preference and right hemiparesis. Did not pass swallow, pending po access   OBJECTIVE Temp:  [98.2 F (36.8 C)-101 F (38.3 C)] 98.2 F (36.8 C) (06/03 0800) Pulse Rate:  [77-100] 83 (06/03 0800) Resp:  [11-25] 16 (06/03 0800) BP: (121-150)/(60-103) 143/75 (06/03 0800) SpO2:  [90 %-100 %] 94 % (06/03 0800)  Recent Labs  Lab 05/14/21 0818  GLUCAP 107*   Recent Labs  Lab 05/14/21 0119  NA 141  K 3.5  CL 105  CO2 27  GLUCOSE 117*  BUN 10  CREATININE 0.83  CALCIUM 8.8*   Recent Labs  Lab 05/14/21 0119  AST 33  ALT 23  ALKPHOS 84  BILITOT 0.7  PROT 7.6  ALBUMIN 4.1   Recent Labs  Lab 05/14/21 0119  WBC 12.8*  HGB 13.6  HCT 42.9  MCV 99.8  PLT 192   No results for input(s): CKTOTAL, CKMB, CKMBINDEX, TROPONINI in the last 168 hours. Recent Labs    05/14/21 0119  LABPROT 13.1  INR 1.0   Recent Labs    05/14/21 0254  COLORURINE YELLOW  LABSPEC 1.008  PHURINE 9.0*  GLUCOSEU NEGATIVE  HGBUR NEGATIVE  BILIRUBINUR NEGATIVE  KETONESUR NEGATIVE  PROTEINUR NEGATIVE  NITRITE NEGATIVE  LEUKOCYTESUR TRACE*       Component Value Date/Time   CHOL 203 (H) 05/14/2021 0022   TRIG 71 05/14/2021 0022   HDL 68 05/14/2021 0022   CHOLHDL 3.0 05/14/2021 0022   VLDL 14 05/14/2021 0022   LDLCALC 121 (H) 05/14/2021 0022   Lab Results  Component Value Date   HGBA1C 5.7 (H) 05/14/2021      Component Value Date/Time   LABOPIA NONE DETECTED 05/14/2021 0254   COCAINSCRNUR NONE DETECTED 05/14/2021 0254   LABBENZ NONE DETECTED 05/14/2021 0254   AMPHETMU NONE DETECTED 05/14/2021 0254   THCU NONE DETECTED 05/14/2021 0254   LABBARB NONE DETECTED 05/14/2021 0254    No results for  input(s): ETH in the last 168 hours.  I have personally reviewed the radiological images below and agree with the radiology interpretations.  DG Abd 1 View  Result Date: 05/14/2021 CLINICAL DATA:  60 year old female with intracranial hemorrhage. EXAM: ABDOMEN - 1 VIEW COMPARISON:  Portable pelvis radiograph today reported separately. FINDINGS: Portable AP supine view at 0344 hours. Non obstructed bowel gas pattern. No definite pneumoperitoneum on this supine view. Minimally included left lung base appears grossly negative. Scoliosis and degenerative changes in the spine. Pelvic dystrophic and/or vascular calcifications, mild. No acute osseous abnormality identified. IMPRESSION: 1. Normal bowel gas pattern. 2. Spinal scoliosis and degeneration. Electronically Signed   By: Odessa Fleming M.D.   On: 05/14/2021 06:19   CT HEAD WO CONTRAST  Result Date: 05/15/2021 CLINICAL DATA:  Cerebral hemorrhage suspected. EXAM: CT HEAD WITHOUT CONTRAST TECHNIQUE: Contiguous axial images were obtained from the base of the skull through the vertex without intravenous contrast. COMPARISON:  MRI and CT of the brain May 14, 2021. FINDINGS: Brain: No significant interval change size of the left thalamic hemorrhage with hematoma now measuring approximately 3.5 x 3.3 x 2 cm compared to 3.3 x 3.3 x 2.2 cm on prior. Persistent hypodensity surrounding the hematoma related to vasogenic edema. Supratentorial and infratentorial intraventricular hemorrhage related  to hematoma decompression into the right lateral ventricle, status post right frontal approach ventricular drain placement. Drain tip is at level of the right foramen of Monro. There is mild interval decrease in size of the supratentorial ventricles, particularly the temporal horns. Patchy hypodensity of the white matter of the cerebral hemispheres, nonspecific most likely related to small vessel ischemia. Remote lacunar infarct in the right radiata. Vascular: No hyperdense vessel or  unexpected calcification. Skull: Negative for fracture or focal lesion. Sinuses/Orbits: No acute finding. Other: None. IMPRESSION: 1. Interval placement of a right frontal approach ventricular drain with the tip at the level of the right foramen of Monro. Interval mild decrease in size of the ventricular system, particularly the temporal horns of the lateral ventricles. 2. Stable appearance of left thalamic intraparenchymal hemorrhage with intraventricular extension. Electronically Signed   By: Baldemar Lenis M.D.   On: 05/15/2021 09:41   CT HEAD WO CONTRAST  Result Date: 05/14/2021 CLINICAL DATA:  Intraparenchymal hemorrhage.  Follow-up. EXAM: CT HEAD WITHOUT CONTRAST TECHNIQUE: Contiguous axial images were obtained from the base of the skull through the vertex without intravenous contrast. COMPARISON:  Same day CT head. FINDINGS: Mildly motion limited exam.  Within this limitation: Brain: When accounting for differences in technique, similar size of the acute intraparenchymal hemorrhage centered in the left thalamus which measures up to 3.3 x 2.2 x 3.3 cm (similar when remeasured on the prior). Similar regional mass effect. Similar intraventricular hemorrhage within the left greater than right lateral ventricles, third ventricle, and fourth ventricle. Similar ventriculomegaly with mild periventricular edema and prominence of the temporal horns. Additional scattered white matter hypoattenuation is nonspecific but most likely related to chronic microvascular ischemic disease. No evidence of acute large vascular territory infarct. Similar suspected prior infarct in the right corona radiata. Vascular: No hyperdense vessel identified. Calcific atherosclerosis. Skull: No acute fracture. Sinuses/Orbits: No acute findings. Other: No mastoid effusions. IMPRESSION: When accounting for differences in technique, similar size of the acute left thalamic intraparenchymal hemorrhage. Similar intraventricular  extension of hemorrhage and ventriculomegaly. Electronically Signed   By: Feliberto Harts MD   On: 05/14/2021 06:21   CT HEAD WO CONTRAST  Result Date: 05/14/2021 CLINICAL DATA:  Intraparenchymal hemorrhage. Follow-up of scans performed at [place], [place] [facility]. EXAM: CT HEAD WITHOUT CONTRAST TECHNIQUE: Contiguous axial images were obtained from the base of the skull through the vertex without intravenous contrast. COMPARISON:  None. I am not able to view the outside institution scans. FINDINGS: Brain: Intraparenchymal hematoma centered in the left thalamus measures 3.2 x 2.2 x 3.0 cm (volume = 11 cm^3). There is moderate hydrocephalus with periventricular hypoattenuation suggesting transependymal interstitial edema. There is rightward bulging of the septum pellucidum. Large amount of blood in the ventricles. Vascular: No abnormal hyperdensity of the major intracranial arteries or dural venous sinuses. No intracranial atherosclerosis. Skull: The visualized skull base, calvarium and extracranial soft tissues are normal. Sinuses/Orbits: No fluid levels or advanced mucosal thickening of the visualized paranasal sinuses. No mastoid or middle ear effusion. The orbits are normal. IMPRESSION: 1. Intraparenchymal hematoma centered in the left thalamus with intraventricular extension and moderate hydrocephalus with transependymal interstitial edema. Critical Value/emergent results were called by telephone at the time of interpretation on 05/14/2021 at 12:58 am to provider [facility] , who verbally acknowledged these results. Electronically Signed   By: Deatra Robinson M.D.   On: 05/14/2021 00:58   MR MRA HEAD WO CONTRAST  Result Date: 05/14/2021 CLINICAL DATA:  Intracranial hemorrhage EXAM:  MRI HEAD WITHOUT CONTRAST MRA HEAD WITHOUT CONTRAST TECHNIQUE: Multiplanar, multi-echo pulse sequences of the brain and surrounding structures were acquired without intravenous contrast. Angiographic images of the Circle of  Will[place] were acquired using MRA technique without intravenous contrast. COMPARISON: No pertinent prior exam. COMPARISON:  Correlation made with prior head CT FINDINGS: MRI HEAD Brain: Left thalamic hemorrhage is again identified similar to the recent CT with acute blood products. There is mild surrounding edema. Significant intraventricular extension is present as seen on CT with resulting hydrocephalus. Focus susceptibility in the right corona radiata reflects chronic microhemorrhage associated with a lacunar infarct. Additional patchy and confluent areas of T2 hyperintensity in the supratentorial white matter are nonspecific but probably reflect moderate chronic microvascular ischemic changes. No post contrast imaging performed therefore potential underlying lesion is not well evaluated. Vascular: Major vessel flow voids at the skull base are preserved. Skull and upper cervical spine: Normal marrow signal is preserved. Sinuses/Orbits: Paranasal sinuses are aerated. Orbits are unremarkable. Other: Sella is unremarkable.  Mastoid air cells are clear. MRA HEAD Intracranial internal carotid arteries are patent. Middle and anterior cerebral arteries are patent. Intracranial vertebral arteries, basilar artery, posterior cerebral arteries are patent. There is no significant stenosis or aneurysm. No abnormal flow related enhancement in the region of thalamic hemorrhage. IMPRESSION: No substantial change in left thalamic hemorrhage with significant intraventricular extension and hydrocephalus as well as mild edema and mass effect compared to recent CT. 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 Moderate chronic microvascular ischemic changes. Unremarkable MRA. Electronically Signed   By: Guadlupe Spanish M.D.   On: 05/14/2021 11:18   MR BRAIN WO CONTRAST  Result Date: 05/14/2021 CLINICAL DATA:  Intracranial hemorrhage EXAM: MRI HEAD WITHOUT CONTRAST MRA HEAD WITHOUT CONTRAST  TECHNIQUE: Multiplanar, multi-echo pulse sequences of the brain and surrounding structures were acquired without intravenous contrast. Angiographic images of the Circle of Willis were acquired using MRA technique without intravenous contrast. COMPARISON: No pertinent prior exam. COMPARISON:  Correlation made with prior head CT FINDINGS: MRI HEAD Brain: Left thalamic hemorrhage is again identified similar to the recent CT with acute blood products. There is mild surrounding edema. Significant intraventricular extension is present as seen on CT with resulting hydrocephalus. Focus susceptibility in the right corona radiata reflects chronic microhemorrhage associated with a lacunar infarct. Additional patchy and confluent areas of T2 hyperintensity in the supratentorial white matter are nonspecific but probably reflect moderate chronic microvascular ischemic changes. No post contrast imaging performed therefore potential underlying lesion is not well evaluated. Vascular: Major vessel flow voids at the skull base are preserved. Skull and upper cervical spine: Normal marrow signal is preserved. Sinuses/Orbits: Paranasal sinuses are aerated. Orbits are unremarkable. Other: Sella is unremarkable.  Mastoid air cells are clear. MRA HEAD Intracranial internal carotid arteries are patent. Middle and anterior cerebral arteries are patent. Intracranial vertebral arteries, basilar artery, posterior cerebral arteries are patent. There is no significant stenosis or aneurysm. No abnormal flow related enhancement in the region of thalamic hemorrhage. IMPRESSION: No substantial change in left thalamic hemorrhage with significant intraventricular extension and hydrocephalus as well as mild edema and mass effect compared to recent CT. 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 Moderate chronic microvascular ischemic changes. Unremarkable MRA. Electronically Signed   By: Guadlupe Spanish M.D.   On:  05/14/2021 11:18   DG Pelvis Portable  Result Date: 05/14/2021 CLINICAL DATA:  60 year old female with intracranial hemorrhage. EXAM: PORTABLE  PELVIS 1-2 VIEWS COMPARISON:  None. FINDINGS: Portable AP view at 0346 hours. Mild likely fibroid associated dystrophic calcifications in the pelvis eccentric to the right. Femoral heads normally located. Pelvis and proximal femurs appear intact. No acute osseous abnormality identified. Negative visible bowel gas pattern. IMPRESSION: Negative. Electronically Signed   By: Odessa Fleming M.D.   On: 05/14/2021 06:18   DG CHEST PORT 1 VIEW  Result Date: 05/14/2021 CLINICAL DATA:  Provided history: Follow-up. EXAM: PORTABLE CHEST 1 VIEW COMPARISON:  None. FINDINGS: Cardiomediastinal silhouette is enlarged, likely accentuated by AP technique. Calcific atherosclerosis of the aorta. Low lung volumes. No consolidation. EKG leads bilaterally. No visible pleural effusions or pneumothorax on this single semi erect AP radiograph. IMPRESSION: 1. Low lung volumes without evidence of acute cardiopulmonary disease. 2. Cardiomegaly. Electronically Signed   By: Feliberto Harts MD   On: 05/14/2021 06:13   VAS US CAROTID  Result Date: 05/14/2021 Carotid Arterial Duplex Study Patient Name:  MALISSIE MUSGRAVE  Date of Exam:   05/14/2021 Medical Rec #: [id number]     Accession #:    [id number] Date of Birth: 08/29/1960     Patient Gender: F Patient Age:   60Y Exam Location:  [facility] Procedure:      VAS US CAROTID Referring Phys: [id number] Marvel Plan --------------------------------------------------------------------------------  Indications:       Hypertensive emergency (122/128). 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 Risk Factors:      Hypertension. Comparison Study:  No prior study on file Performing Technologist: Sherren Kerns RVS  Examination Guidelines: A complete evaluation includes B-mode imaging, spectral Doppler, color Doppler, and power  Doppler as needed of all accessible portions of each vessel. Bilateral testing is considered an integral part of a complete examination. Limited examinations for reoccurring indications may be performed as noted.  Right Carotid Findings: +----------+--------+--------+--------+------------------+--------+           PSV cm/sEDV cm/sStenosisPlaque DescriptionComments +----------+--------+--------+--------+------------------+--------+ CCA Prox  96      9                                          +----------+--------+--------+--------+------------------+--------+ CCA Distal104     13                                         +----------+--------+--------+--------+------------------+--------+ ICA Prox  66      15                                         +----------+--------+--------+--------+------------------+--------+ ICA Distal54      13                                         +----------+--------+--------+--------+------------------+--------+ ECA       151     22                                         +----------+--------+--------+--------+------------------+--------+ +----------+--------+-------+--------+-------------------+  PSV cm/sEDV cmsDescribeArm Pressure (mmHG) +----------+--------+-------+--------+-------------------+ Subclavian150                                        +----------+--------+-------+--------+-------------------+ +---------+--------+--+--------+--+ VertebralPSV cm/s41EDV cm/s12 +---------+--------+--+--------+--+  Left Carotid Findings: +----------+--------+--------+--------+------------------+--------+           PSV cm/sEDV cm/sStenosisPlaque DescriptionComments +----------+--------+--------+--------+------------------+--------+ CCA Prox  180     15                                         +----------+--------+--------+--------+------------------+--------+ CCA Distal98      13                                          +----------+--------+--------+--------+------------------+--------+ ICA Prox  70      22                                         +----------+--------+--------+--------+------------------+--------+ ICA Distal68      22                                         +----------+--------+--------+--------+------------------+--------+ ECA       91      18                                         +----------+--------+--------+--------+------------------+--------+ +----------+--------+--------+--------+-------------------+           PSV cm/sEDV cm/sDescribeArm Pressure (mmHG) +----------+--------+--------+--------+-------------------+ [id number]                                          +----------+--------+--------+--------+-------------------+ +---------+--------+--+--------+--+ VertebralPSV cm/s61EDV cm/s13 +---------+--------+--+--------+--+      Preliminary      PHYSICAL EXAM  Temp:  [98.2 F (36.8 C)-101 F (38.3 C)] 98.2 F (36.8 C) (06/03 0800) Pulse Rate:  [77-100] 83 (06/03 0800) Resp:  [11-25] 16 (06/03 0800) BP: (121-150)/(60-103) 143/75 (06/03 0800) SpO2:  [90 %-100 %] 94 % (06/03 0800)  General - Well nourished, well developed, mildly restless.  Ophthalmologic - fundi not visualized due to noncooperation.  Cardiovascular - Regular rhythm and rate.  Neuro - mildly restless, eyes open, awake, nonverbal, not answering orientation questions, followed only eye close/open but no other commands. Left gaze preference and barely cross midline, not blinking to visual threat consistently, PERRL. Right facial droop. Tongue protrusion not cooperative. Spontaneously moving LUE against gravity, no drift, flaccid on the RUE however with pain, bicep 3-/5. Withdraw to pain LLE 3/5, but only mild withdraw 2/5 at RLE. Sensation, coordination not cooperative and gait not tested.   ASSESSMENT/PLAN Ms. Geroldine Esquivias is a 60 y.o. female with no significant history  admitted for N/V, right sided weakness and facial droop. No tPA given due to ICH.    ICH with IVH:  left thalamic ICH with extensive IVH -  secondary to hypertensive  emergency  CT head left thalamic ICH with IVH  Repeat CT head stable ICH and ventricles  MRI  Stable ICH and ventricles  MRA  Unremarkable.   Carotid Doppler  pending  2D Echo  pending  LDL 121  HgbA1c 5.7  SCDs for VTE prophylaxis  No antithrombotic prior to admission, now on No antithrombotic due to ICH  Ongoing aggressive stroke risk factor management  Therapy recommendations:  Pending   Disposition:  Pending   Hydrocephalus   Slightly enlarged ventricles   Repeat CT and MRI showed stable ventricle size  S/p EVD yesterday, draining well 20cc/h  Repeat CT 6/3 mildly decreased ventricle size  On keppra 500mg  bid for seizure prophylaxis   Close monitoring  Hypertensive emergency  . Unstable . On maximized cleviprex . On labetalol PRN . Consider PO meds once po access  Long term BP goal normotensive  Hyperlipidemia  Home meds:  none   LDL 121, goal < 70  Consider statin at discharge  Dysphagia   Did not pass swallow  NPO now  Pending cortrak today  Consider po meds once po access  Other Stroke Risk Factors    Other Active Problems  Leukocytosis WBC 12.8  Hospital day # 1  This patient is critically ill due to left thalamic ICH with IVH, s/p EVD, hydrocephalus, hypertensive emergency and at significant risk of neurological worsening, death form brain herniation, obstructive hydrocephalus, seizure, sepsis. This patient's care requires constant monitoring of vital signs, hemodynamics, respiratory and cardiac monitoring, review of multiple databases, neurological assessment, discussion with family, other specialists and medical decision making of high complexity. I spent 45 minutes of neurocritical care time in the care of this patient. I discussed with Dr. Denese KillingsAgarwala. I had long  discussion with sister over the phone, updated pt current condition, treatment plan and potential prognosis, and answered all the questions. She expressed understanding and appreciation.    Marvel PlanJindong Dayson Aboud, MD PhD Stroke Neurology 05/15/2021 10:35 AM    To contact Stroke Continuity provider, please refer to [email or web address]. After hours, contact General Neurology

## 2021-05-16 DIAGNOSIS — I10 Essential (primary) hypertension: Secondary | ICD-10-CM

## 2021-05-16 LAB — ECHOCARDIOGRAM COMPLETE
AR max vel: 1.86 cm2
AV Area VTI: 2.05 cm2
AV Area mean vel: 2.09 cm2
AV Mean grad: 8 mmHg
AV Peak grad: 16.3 mmHg
Ao pk vel: 2.02 m/s
Area-P 1/2: 3.08 cm2
MV VTI: 1.83 cm2
S' Lateral: 4.2 cm
Weight: 2656.1 oz

## 2021-05-16 LAB — CBC
HCT: 44.5 % (ref 36.0–46.0)
Hemoglobin: 15.3 g/dL — ABNORMAL HIGH (ref 12.0–15.0)
MCH: 32.6 pg (ref 26.0–34.0)
MCHC: 34.4 g/dL (ref 30.0–36.0)
MCV: 94.9 fL (ref 80.0–100.0)
Platelets: 186 10*3/uL (ref 150–400)
RBC: 4.69 MIL/uL (ref 3.87–5.11)
RDW: 12.9 % (ref 11.5–15.5)
WBC: 12.5 10*3/uL — ABNORMAL HIGH (ref 4.0–10.5)
nRBC: 0 % (ref 0.0–0.2)

## 2021-05-16 LAB — BASIC METABOLIC PANEL
Anion gap: 11 (ref 5–15)
BUN: 12 mg/dL (ref 6–20)
CO2: 24 mmol/L (ref 22–32)
Calcium: 8.3 mg/dL — ABNORMAL LOW (ref 8.9–10.3)
Chloride: 101 mmol/L (ref 98–111)
Creatinine, Ser: 0.74 mg/dL (ref 0.44–1.00)
GFR, Estimated: >60 mL/min (ref >60)
Glucose, Bld: 163 mg/dL — ABNORMAL HIGH (ref 70–99)
Potassium: 3.8 mmol/L (ref 3.5–5.1)
Sodium: 136 mmol/L (ref 135–145)

## 2021-05-16 LAB — PHOSPHORUS
Phosphorus: 3.6 mg/dL (ref 2.5–4.6)
Phosphorus: 4.3 mg/dL (ref 2.5–4.6)

## 2021-05-16 LAB — MAGNESIUM
Magnesium: 2.5 mg/dL — ABNORMAL HIGH (ref 1.7–2.4)
Magnesium: 2.4 mg/dL (ref 1.7–2.4)

## 2021-05-16 LAB — GLUCOSE, CAPILLARY
Glucose-Capillary: 127 mg/dL — ABNORMAL HIGH (ref 70–99)
Glucose-Capillary: 131 mg/dL — ABNORMAL HIGH (ref 70–99)
Glucose-Capillary: 135 mg/dL — ABNORMAL HIGH (ref 70–99)
Glucose-Capillary: 140 mg/dL — ABNORMAL HIGH (ref 70–99)
Glucose-Capillary: 145 mg/dL — ABNORMAL HIGH (ref 70–99)

## 2021-05-16 LAB — T3, FREE: T3, Free: 1.9 pg/mL — ABNORMAL LOW (ref 2.0–4.4)

## 2021-05-16 MED ORDER — OLANZAPINE 10 MG IM SOLR
2.5000 mg | Freq: Three times a day (TID) | INTRAMUSCULAR | Status: DC | PRN
  Administered 2021-05-16: 2.5 mg via INTRAMUSCULAR
  Filled 2021-05-16 (×2): qty 10

## 2021-05-16 MED ORDER — PANTOPRAZOLE SODIUM 40 MG PO PACK
40.0000 mg | PACK | Freq: Every day | ORAL | Status: DC
  Administered 2021-05-17 – 2021-05-21 (×5): 40 mg
  Filled 2021-05-16 (×5): qty 20

## 2021-05-16 NOTE — Evaluation (Signed)
Occupational Therapy Evaluation Patient Details Name: Marissa Morris MRN: [id number] DOB: [birth year] Today's Date: 05/16/2021    History of Present Illness Marissa Morris is a 60 y.o. female presenting to [facility] in transfer from [facility] for management of acute intracranial hemorrhage. 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 6/3 interval placement of R frontal EVD. No significant PMH.   Clinical Impression   Pt PTA: Pt was independent; helping fiance s.p his back surgeries. Pt currently, totalA for ADL at this time due to R side flaccid and 25% of command following. Pt's gaze L and head turned to L; eyes nearly unable to get to midline and max cues to turn head to R. Pt performing EOB midline trunk stability tasks with wt bearing on R side and modA to maxA for stability at EOB. Pt sit to stands with maxA+2 and R knee blocked. Pt assisting minimally with L side. Pt limited by decreased arousal and no verbalizations today. Pt/family appear very motivated to return to PLOF. Pt would greatly benefit from continued OT skilled services. OT following acutely.    Follow Up Recommendations  CIR    Equipment Recommendations  3 in 1 bedside commode    Recommendations for Other Services Rehab consult     Precautions / Restrictions Precautions Precautions: Fall;Other (comment) Precaution Comments: EVD, posey belt, L wrist restraint and mitten Restrictions Weight Bearing Restrictions: No      Mobility Bed Mobility Overal bed mobility: Needs Assistance Bed Mobility: Supine to Sit;Sit to Supine;Rolling Rolling: Min assist   Supine to sit: Mod assist;+2 for physical assistance Sit to supine: Mod assist;+2 for physical assistance   General bed mobility comments: MinA to roll towards right, pt initiating well, modA + 2 for management of RLE off edge of bed and for trunk control    Transfers Overall transfer level: Needs assistance Equipment used:  None Transfers: Sit to/from Stand Sit to Stand: Max assist;+2 physical assistance         General transfer comment: MaxA + 2 to rise to stand from edge of bed x 2 with right knee block,    Balance Overall balance assessment: Needs assistance Sitting-balance support: Feet supported Sitting balance-Leahy Scale: Poor Sitting balance - Comments: Pt initially requiring maxA, progressing to min guard with decreased postural control intermittently Postural control: Posterior lean;Right lateral lean Standing balance support: No upper extremity supported;During functional activity Standing balance-Leahy Scale: Poor Standing balance comment: reliant on external support                           ADL either performed or assessed with clinical judgement   ADL Overall ADL's : Needs assistance/impaired Eating/Feeding: NPO Eating/Feeding Details (indicate cue type and reason): cortrak Grooming: Total assistance   Upper Body Bathing: Total assistance   Lower Body Bathing: Total assistance   Upper Body Dressing : Total assistance   Lower Body Dressing: Total assistance   Toilet Transfer: Total assistance   Toileting- Clothing Manipulation and Hygiene: Total assistance       Functional mobility during ADLs: Moderate assistance;Total assistance;+2 for physical assistance;Caregiver able to provide necessary level of assistance General ADL Comments: Pt totalA for ADL at this time due to R side flaccid and 25% of command following. Pt turning head to left to see, but will not go beyond midline- requires tactile cues to turn head to R, btu vision stays looking L.     Vision Baseline Vision/History: No  visual deficits Patient Visual Report: Other (comment) (Pt with gaze to L preference) Vision Assessment?: Yes;Vision impaired- to be further tested in functional context Eye Alignment: Impaired (comment) Ocular Range of Motion: Restricted on the left;Impaired-to be further tested in  functional context (looking to L; barely gets eyes to midline) Alignment/Gaze Preference: Gaze left;Head turned     Perception     Praxis      Pertinent Vitals/Pain Pain Assessment: Faces Faces Pain Scale: No hurt Pain Intervention(s): Monitored during session     Hand Dominance Right   Extremity/Trunk Assessment Upper Extremity Assessment Upper Extremity Assessment: RUE deficits/detail RUE Deficits / Details: 0/5 MM grade; + response to painful stimuli RUE Sensation: decreased light touch RUE Coordination: decreased fine motor;decreased gross motor   Lower Extremity Assessment Lower Extremity Assessment: RLE deficits/detail;Defer to PT evaluation RLE Deficits / Details: No active movement noted, adductor tone. Reactive to painful stimulus   Cervical / Trunk Assessment Cervical / Trunk Assessment: Normal   Communication Communication Communication: No difficulties   Cognition Arousal/Alertness: Lethargic Behavior During Therapy: Flat affect Overall Cognitive Status: Difficult to assess                                 General Comments: Pt lethargic, able to arouse intermittently and open eyes briefly for 2-3 seconds, following 25% of 1 step commands. Pt with decreased arousal compared to SLP eval. Family in room attempting to arouse pt to their voice, but pt unable to keep eyes open long enough.   General Comments  VSS on RA.    Exercises     Shoulder Instructions      Home Living Family/patient expects to be discharged to:: Private residence Living Arrangements: Alone   Type of Home: House Home Access: Ramped entrance     Home Layout: One level     Bathroom Shower/Tub: Chief Strategy Officer: Standard     Home Equipment: Bedside commode;Wheelchair - manual (bariatric w/c)          Prior Functioning/Environment Level of Independence: Independent        Comments: not working; assisting fiance as needed s/p his back  surgeries.        OT Problem List: Decreased strength;Decreased activity tolerance;Impaired balance (sitting and/or standing);Decreased coordination;Impaired vision/perception;Decreased cognition;Decreased safety awareness;Pain;Increased edema;Impaired UE functional use      OT Treatment/Interventions: Self-care/ADL training;Therapeutic exercise;Therapeutic activities;Neuromuscular education;Energy conservation;Cognitive remediation/compensation;Visual/perceptual remediation/compensation;Patient/family education;Balance training    OT Goals(Current goals can be found in the care plan section) Acute Rehab OT Goals Patient Stated Goal: pt fiance would like her to get stronger OT Goal Formulation: With patient/family Time For Goal Achievement: 05/30/21 Potential to Achieve Goals: Good ADL Goals Pt Will Perform Grooming: sitting;with min guard assist Pt Will Perform Lower Body Dressing: with mod assist;sitting/lateral leans;sit to/from stand Pt Will Transfer to Toilet: with min assist;with +2 assist;stand pivot transfer;bedside commode Pt/caregiver will Perform Home Exercise Program: Increased strength;Right Upper extremity;Increased ROM;With minimal assist;With written HEP provided Additional ADL Goal #1: Pt will follow 1-2 step commands with minimal cues for arousal in 3/5 trials in non distracting environment. Additional ADL Goal #2: Pt will utilize 3 ADL/iADL items properly with minimal cues to stay on task in 2/3 trials.  OT Frequency: Min 2X/week   Barriers to D/C:            Co-evaluation PT/OT/SLP Co-Evaluation/Treatment: Yes Reason for Co-Treatment: Complexity of the patient's impairments (multi-system involvement);Necessary  to address cognition/behavior during functional activity;For patient/therapist safety;To address functional/ADL transfers PT goals addressed during session: Mobility/safety with mobility;Balance OT goals addressed during session: ADL's and  self-care;Strengthening/ROM      AM-PAC OT "6 Clicks" Daily Activity     Outcome Measure Help from another person eating meals?: Total Help from another person taking care of personal grooming?: Total Help from another person toileting, which includes using toliet, bedpan, or urinal?: Total Help from another person bathing (including washing, rinsing, drying)?: Total Help from another person to put on and taking off regular upper body clothing?: Total Help from another person to put on and taking off regular lower body clothing?: Total 6 Click Score: 6   End of Session Equipment Utilized During Treatment: Gait belt Nurse Communication: Mobility status  Activity Tolerance: Patient limited by fatigue Patient left: in bed;with call bell/phone within reach;with bed alarm set;with family/visitor present  OT Visit Diagnosis: Unsteadiness on feet (R26.81);Muscle weakness (generalized) (M62.81);Other symptoms and signs involving cognitive function;Cognitive communication deficit (R41.841);Hemiplegia and hemiparesis Symptoms and signs involving cognitive functions: Other Nontraumatic ICH Hemiplegia - Right/Left: Right Hemiplegia - dominant/non-dominant: Dominant Hemiplegia - caused by: Other Nontraumatic intracranial hemorrhage                Time: 1205-1240 OT Time Calculation (min): 35 min Charges:  OT General Charges $OT Visit: 1 Visit OT Evaluation $OT Eval Moderate Complexity: 1 Mod  Flora Lipps, OTR/L Acute Rehabilitation Services Pager: [phone] Office: [phone]   Monzerat Handler C 05/16/2021, 2:33 PM

## 2021-05-16 NOTE — Evaluation (Signed)
Physical Therapy Evaluation Patient Details Name: Marissa Morris MRN: [id number] DOB: 12-Feb-1961 Today's Date: 05/16/2021   History of Present Illness  Marissa Morris is a 60 y.o. female presenting to [facility] in transfer from [facility] for management of acute intracranial hemorrhage. 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 6/3 interval placement of R frontal EVD. No significant PMH.  Clinical Impression  PTA, pt lives alone and is independent. Her fiance lives two doors down from her. Pt presents with right hemiparesis, impaired tone, poor balance, and decreased cognition. Pt lethargic during evaluation, but noted to be much more interactive and vocalizing spontaneously with SLP in earlier session. Pt following ~25% of 1 step commands and initiating automatic motor tasks. Requiring two person mod-max assist for functional mobility. Able to stand from edge of bed x 2 with right knee blocked. Suspect good progress given age, PLOF, motivation. Recommend CIR to address deficits and maximize functional independence.    Follow Up Recommendations CIR    Equipment Recommendations  3in1 (PT);Wheelchair (measurements PT);Wheelchair cushion (measurements PT)    Recommendations for Other Services       Precautions / Restrictions Precautions Precautions: Fall;Other (comment) Precaution Comments: EVD, posey belt, L wrist restraint and mitten Restrictions Weight Bearing Restrictions: No      Mobility  Bed Mobility Overal bed mobility: Needs Assistance Bed Mobility: Supine to Sit;Sit to Supine;Rolling Rolling: Min assist   Supine to sit: Mod assist;+2 for physical assistance Sit to supine: Mod assist;+2 for physical assistance   General bed mobility comments: MinA to roll towards right, pt initiating well, modA + 2 for management of RLE off edge of bed and for trunk control    Transfers Overall transfer level: Needs assistance Equipment used:  None Transfers: Sit to/from Stand Sit to Stand: Max assist;+2 physical assistance         General transfer comment: MaxA + 2 to rise to stand from edge of bed x 2 with right knee block, pt with noted glute activation on left. Rocking forward to gain momentum.  Ambulation/Gait                Stairs            Wheelchair Mobility    Modified Rankin (Stroke Patients Only) Modified Rankin (Stroke Patients Only) Pre-Morbid Rankin Score: No symptoms Modified Rankin: Severe disability     Balance Overall balance assessment: Needs assistance Sitting-balance support: Feet supported Sitting balance-Leahy Scale: Poor Sitting balance - Comments: Pt initially requiring maxA, progressing to min guard with decreased postural control intermittently Postural control: Posterior lean;Right lateral lean Standing balance support: No upper extremity supported;During functional activity Standing balance-Leahy Scale: Poor Standing balance comment: reliant on external support                             Pertinent Vitals/Pain Pain Assessment: Faces Faces Pain Scale: No hurt    Home Living Family/patient expects to be discharged to:: Private residence Living Arrangements: Alone   Type of Home: House Home Access: Ramped entrance     Home Layout: One level Home Equipment: Bedside commode;Wheelchair - manual (bariatric w/c)      Prior Function Level of Independence: Independent         Comments: not working; assisting fiance as needed s/p his back surgeries.     Hand Dominance   Dominant Hand: Right    Extremity/Trunk Assessment   Upper Extremity Assessment Upper Extremity  Assessment: Defer to OT evaluation    Lower Extremity Assessment Lower Extremity Assessment: RLE deficits/detail RLE Deficits / Details: No active movement noted, adductor tone. Reactive to painful stimulus    Cervical / Trunk Assessment Cervical / Trunk Assessment: Normal   Communication   Communication: No difficulties  Cognition Arousal/Alertness: Lethargic Behavior During Therapy: Flat affect Overall Cognitive Status: Difficult to assess                                 General Comments: Pt lethargic, able to arouse intermittently and open eyes briefly with min-mod stimulation, following 25% of 1 step commands. Appeared to be more interactive and talkative with SLP earlier in the day      General Comments      Exercises     Assessment/Plan    PT Assessment Patient needs continued PT services  PT Problem List Decreased strength;Decreased activity tolerance;Decreased mobility;Decreased balance;Decreased cognition;Decreased safety awareness       PT Treatment Interventions DME instruction;Gait training;Functional mobility training;Therapeutic activities;Therapeutic exercise;Balance training;Neuromuscular re-education;Cognitive remediation;Patient/family education;Wheelchair mobility training    PT Goals (Current goals can be found in the Care Plan section)  Acute Rehab PT Goals Patient Stated Goal: pt fiance would like her to get stronger PT Goal Formulation: With patient/family Time For Goal Achievement: 05/30/21 Potential to Achieve Goals: Good    Frequency Min 4X/week   Barriers to discharge        Co-evaluation PT/OT/SLP Co-Evaluation/Treatment: Yes Reason for Co-Treatment: Complexity of the patient's impairments (multi-system involvement);For patient/therapist safety;To address functional/ADL transfers;Necessary to address cognition/behavior during functional activity PT goals addressed during session: Mobility/safety with mobility;Balance OT goals addressed during session: ADL's and self-care;Strengthening/ROM       AM-PAC PT "6 Clicks" Mobility  Outcome Measure Help needed turning from your back to your side while in a flat bed without using bedrails?: A Little Help needed moving from lying on your back to sitting  on the side of a flat bed without using bedrails?: A Lot Help needed moving to and from a bed to a chair (including a wheelchair)?: Total Help needed standing up from a chair using your arms (e.g., wheelchair or bedside chair)?: Total Help needed to walk in hospital room?: Total Help needed climbing 3-5 steps with a railing? : Total 6 Click Score: 9    End of Session Equipment Utilized During Treatment: Gait belt Activity Tolerance: Patient limited by lethargy Patient left: in bed;with call bell/phone within reach;with bed alarm set;with restraints reapplied Nurse Communication: Mobility status PT Visit Diagnosis: Unsteadiness on feet (R26.81);Hemiplegia and hemiparesis Hemiplegia - Right/Left: Right Hemiplegia - dominant/non-dominant: Dominant Hemiplegia - caused by: Other Nontraumatic intracranial hemorrhage    Time: 1213-1244 PT Time Calculation (min) (ACUTE ONLY): 31 min   Charges:   PT Evaluation $PT Eval Moderate Complexity: 1 Mod          Lillia Pauls, PT, DPT Acute Rehabilitation Services Pager [phone] Office [phone]   Norval Morton 05/16/2021, 2:20 PM

## 2021-05-16 NOTE — Progress Notes (Signed)
NAMEEcho Morris, MRN:  [id number], DOB:  1961/01/16, LOS: 2 ADMISSION DATE:  05/13/2021, CONSULTATION DATE:  05/14/21 REFERRING MD:  , CHIEF COMPLAINT:   intracranial hemorrhage  HPI/course in hospital  Marissa Morris a 60 y.o.femalepresenting to [facility]  after transfer from [facility] [phone]) for management of acute intracranial hemorrhage.  Patient was very somnolent and unable to answer questions coherently.  Per notes and records from [place], EMS had been called to the patient's home and on arrival the patient was found to be slumped over in a chair. Sheprojectilevomited and thenwas noted to beflaccid on the right with right facial droop and inability to correctly answer questions On arrival to the ED, BP was 222/128. CT head revealed a 3 cm thalamic hemorrhage with intraventricular extension.  Initial NIHSS was 25. Initial exam by EDP at OSH: Patient awake with slow but comprehensible speech. Oriented to person. Confused. CN were normal. LUE and LLE 5/5, RUE and RLE with flaccid paralysis. Follow up NIHSS was 17.  Collateral: Marissa Morris [id number]); patient sister, for more formation.  Yesterday, patient was watching television boyfriend.  She states that her sister slumped over in her chair and was unable to answer questions.  She reports that the boyfriend was unable to understand what she was saying.  Patient then started vomiting and EMS was called.  Patient's sister does not know her complete medical history, but states she has a history of hypertension, hyperlipidemia and thyroid dysfunction; she was unsure patient has hypothyroidism or hyperthyroidism.  She believes that her sister is a patient at [facility] in [place]. Called [facility]; she last say them in Oct 2019. They were unable to given current information about pmh and home medications.   Past Medical History  PMH of hypertension,  hyperlipidemia and thyroid dysfunction (unclear if hypothyroid or hypothyroid)   Imaging:  Carotid dopplers 6/3> 1-39% stenosis   6/2 CT H> L thalamic ICH with ventricular extension  6/3 CT H> Interval placement of R frontal EVD  6/2 Marissa> LVEH 50-55% mild LVH. Grade II diastolic dysfunction.   Interim history/subjective:  On cleviprex still  More alert and interactive than yesterday   Objective   Blood pressure (!) 162/81, pulse (!) 101, temperature 98.7 F (37.1 C), temperature source Oral, resp. rate (!) 28, weight 75.3 kg, SpO2 95 %.        Intake/Output Summary (Last 24 hours) at 05/16/2021 0740 Last data filed at 05/16/2021 0700 Gross per 24 hour  Intake 1972.76 ml  Output 1032 ml  Net 940.76 ml   Filed Weights   05/13/21 2256  Weight: 75.3 kg    Examination:   General: ill appearing middle aged F supine in bed, restrained, NAD. Restless  HEENT: R frontal EVD. Anicteric sclera pink mmm Neuro: Awake. Moving spontaneously LUE >RUE, LLE.  following L sided commands intermittently. L gaze preference. Intermittently coherent speech for short phrases CV: rr s1s2 cap refill brisk  Pulm: CTAb symmetrical chest expansion even and unlabored  FT:DDUK round ndnt  GU: defer  MSK: no acute joint deformities. No cyanosis or clubbing Skin: c/d/w   Ancillary tests (personally reviewed)  CBC: Recent Labs  Lab 05/14/21 0119 05/16/21 0605  WBC 12.8* 12.5*  HGB 13.6 15.3*  HCT 42.9 44.5  MCV 99.8 94.9  PLT 192 186    Basic Metabolic Panel: Recent Labs  Lab 05/14/21 0119 05/15/21 1340 05/15/21 1735 05/16/21 0605  NA 141 136  --  136  K 3.5 3.7  --  3.8  CL 105 104  --  101  CO2 27 22  --  24  GLUCOSE 117* 93  --  163*  BUN 10 10  --  12  CREATININE 0.83 0.64  --  0.74  CALCIUM 8.8* 8.2*  --  8.3*  MG  --  2.1 2.3 2.5*  PHOS  --  3.8 3.4 3.6   GFR: CrCl cannot be calculated (Unknown ideal weight.). Recent Labs  Lab 05/14/21 0119 05/16/21 0605  WBC 12.8*  12.5*    Liver Function Tests: Recent Labs  Lab 05/14/21 0119  AST 33  ALT 23  ALKPHOS 84  BILITOT 0.7  PROT 7.6  ALBUMIN 4.1   No results for input(s): LIPASE, AMYLASE in the last 168 hours. No results for input(s): AMMONIA in the last 168 hours.  ABG No results found for: PHART, PCO2ART, PO2ART, HCO3, TCO2, ACIDBASEDEF, O2SAT   Coagulation Profile: Recent Labs  Lab 05/14/21 0119  INR 1.0    Cardiac Enzymes: No results for input(s): CKTOTAL, CKMB, CKMBINDEX, TROPONINI in the last 168 hours.  HbA1C: Hgb A1c MFr Bld  Date/Time Value Ref Range Status  05/14/2021 12:23 AM 5.7 (H) 4.8 - 5.6 % Final    Comment:    (NOTE)         Prediabetes: 5.7 - 6.4         Diabetes: >6.4         Glycemic control for adults with diabetes: <7.0     CBG: Recent Labs  Lab 05/14/21 0818 05/16/21 0311  GLUCAP 107* 127*   Resolved problems:  hypertensive emergency   Assessment & Plan:   L thalamic ICH with intraventricular extention, hydrocephalus-- s/p R frontal EVD  -in setting of hypertensive emergency  P -EVD per NSGY -stroke management per neuro  -keppra for sz ppx  - cleviprex sor SBP <140   HTN P -lisinopril, coreg, hctz per tube -cleviprex as above   Dysphagia P -cont EN via cortak -continued SLP  HLD -consider statin   Daily Goals Checklist  Diet: EN Pain/Anxiety/Delirium protocol (if indicated): no VAP protocol (if indicated): No Respiratory support goals: N/A Blood pressure target: < 140 DVT prophylaxis: SCD  GI prophylaxis: PPI Urinary catheter: n/a Central lines: N/A Mobility:PT/OT Glucose control: Monitor Code Status: Full Code Family Communication: Sister - Marissa Morris Disposition: ICU  Critical care time:     CRITICAL CARE Performed by: Lanier Clam   Total critical care time: 37 minutes  Critical care time was exclusive of separately billable procedures and treating other patients. Critical care was necessary to  treat or prevent imminent or life-threatening deterioration.  Critical care was time spent personally by me on the following activities: development of treatment plan with patient and/or surrogate as well as nursing, discussions with consultants, evaluation of patient's response to treatment, examination of patient, obtaining history from patient or surrogate, ordering and performing treatments and interventions, ordering and review of laboratory studies, ordering and review of radiographic studies, pulse oximetry and re-evaluation of patient's condition.   Tessie Fass MSN, AGACNP-BC [facility] Pulmonary/Critical Care Medicine Amion for pager  05/16/2021, 1:08 PM

## 2021-05-16 NOTE — Progress Notes (Signed)
  Speech Language Pathology Treatment: Cognitive-Linquistic  Patient Details Name: Marissa Morris MRN: [id number] DOB: 07/18/1961 Today's Date: 05/16/2021 Time: 3329-5188 SLP Time Calculation (min) (ACUTE ONLY): 14 min  Assessment / Plan / Recommendation Clinical Impression  Pt more communicative today - producing more spontaneous verbal output.  Unable to name to confrontation; improving ability to repeat single words (some phonemic paraphasias noted).  Counted from 1-5, named DOW, and sang "take me out to the ballgame" with verbal/visual cues and repetition required.  Paraphasias and perseverations evident without pt awareness. Pt named her fiance "Ted" without cues.  Demonstrating improved ability to follow simple commands - accuracy improves when given multiple opportunities, demonstrating carry-over. SLP will continue to follow for aphasia and dysphagia.  Is currently NPO with cortrak.    HPI HPI: Marissa Morris is a 60 y.o. female presenting to [facility] in transfer from [facility] for management of acute intracranial hemorrhage. 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 Moderate chronic microvascular ischemic changes. No PMH on file.      SLP Plan  Continue with current plan of care       Recommendations                   Oral Care Recommendations: Oral care QID Follow up Recommendations: Inpatient Rehab SLP Visit Diagnosis: Aphasia (R47.01) Plan: Continue with current plan of care       GO               Zakariyah Freimark L. Samson Frederic, MA CCC/SLP Acute Rehabilitation Services Office number [phone] Pager [phone]  Blenda Mounts Laurice 05/16/2021, 10:37 AM

## 2021-05-16 NOTE — Progress Notes (Addendum)
STROKE TEAM PROGRESS NOTE   SUBJECTIVE (INTERVAL HISTORY) Her fiance at bedside, reported that he thinks she is doing better. Following more commands. . Pt lying in bed, had EVD done yesterday, draining well. Pt neurologically unchanged except seems less sleepy. Still has left gaze preference and right hemiparesis. Seen by Speech therapy today, continue NPO with cortak   OBJECTIVE Temp:  [98.4 F (36.9 C)-99.2 F (37.3 C)] 98.8 F (37.1 C) (06/04 0800) Pulse Rate:  [73-104] 95 (06/04 0845) Resp:  [14-35] 19 (06/04 0845) BP: (102-201)/(63-179) 150/81 (06/04 0845) SpO2:  [77 %-98 %] 93 % (06/04 0845)  Recent Labs  Lab 05/14/21 0818 05/16/21 0311  GLUCAP 107* 127*   Recent Labs  Lab 05/14/21 0119 05/15/21 1340 05/15/21 1735 05/16/21 0605  NA 141 136  --  136  K 3.5 3.7  --  3.8  CL 105 104  --  101  CO2 27 22  --  24  GLUCOSE 117* 93  --  163*  BUN 10 10  --  12  CREATININE 0.83 0.64  --  0.74  CALCIUM 8.8* 8.2*  --  8.3*  MG  --  2.1 2.3 2.5*  PHOS  --  3.8 3.4 3.6   Recent Labs  Lab 05/14/21 0119  AST 33  ALT 23  ALKPHOS 84  BILITOT 0.7  PROT 7.6  ALBUMIN 4.1   Recent Labs  Lab 05/14/21 0119 05/16/21 0605  WBC 12.8* 12.5*  HGB 13.6 15.3*  HCT 42.9 44.5  MCV 99.8 94.9  PLT 192 186   No results for input(s): CKTOTAL, CKMB, CKMBINDEX, TROPONINI in the last 168 hours. Recent Labs    05/14/21 0119  LABPROT 13.1  INR 1.0   Recent Labs    05/14/21 0254  COLORURINE YELLOW  LABSPEC 1.008  PHURINE 9.0*  GLUCOSEU NEGATIVE  HGBUR NEGATIVE  BILIRUBINUR NEGATIVE  KETONESUR NEGATIVE  PROTEINUR NEGATIVE  NITRITE NEGATIVE  LEUKOCYTESUR TRACE*       Component Value Date/Time   CHOL 203 (H) 05/14/2021 0022   TRIG 71 05/14/2021 0022   HDL 68 05/14/2021 0022   CHOLHDL 3.0 05/14/2021 0022   VLDL 14 05/14/2021 0022   LDLCALC 121 (H) 05/14/2021 0022   Lab Results  Component Value Date   HGBA1C 5.7 (H) 05/14/2021      Component Value Date/Time    LABOPIA NONE DETECTED 05/14/2021 0254   COCAINSCRNUR NONE DETECTED 05/14/2021 0254   LABBENZ NONE DETECTED 05/14/2021 0254   AMPHETMU NONE DETECTED 05/14/2021 0254   THCU NONE DETECTED 05/14/2021 0254   LABBARB NONE DETECTED 05/14/2021 0254    No results for input(s): ETH in the last 168 hours.  I have personally reviewed the radiological images below and agree with the radiology interpretations.  DG Abd 1 View  Result Date: 05/14/2021 CLINICAL DATA:  60 year old female with intracranial hemorrhage. EXAM: ABDOMEN - 1 VIEW COMPARISON:  Portable pelvis radiograph today reported separately. FINDINGS: Portable AP supine view at 0344 hours. Non obstructed bowel gas pattern. No definite pneumoperitoneum on this supine view. Minimally included left lung base appears grossly negative. Scoliosis and degenerative changes in the spine. Pelvic dystrophic and/or vascular calcifications, mild. No acute osseous abnormality identified. IMPRESSION: 1. Normal bowel gas pattern. 2. Spinal scoliosis and degeneration. Electronically Signed   By: Odessa FlemingH  Hall M.D.   On: 05/14/2021 06:19   CT HEAD WO CONTRAST  Result Date: 05/15/2021 CLINICAL DATA:  Cerebral hemorrhage suspected. EXAM: CT HEAD WITHOUT CONTRAST TECHNIQUE: Contiguous axial images  were obtained from the base of the skull through the vertex without intravenous contrast. COMPARISON:  MRI and CT of the brain May 14, 2021. FINDINGS: Brain: No significant interval change size of the left thalamic hemorrhage with hematoma now measuring approximately 3.5 x 3.3 x 2 cm compared to 3.3 x 3.3 x 2.2 cm on prior. Persistent hypodensity surrounding the hematoma related to vasogenic edema. Supratentorial and infratentorial intraventricular hemorrhage related to hematoma decompression into the right lateral ventricle, status post right frontal approach ventricular drain placement. Drain tip is at level of the right foramen of Monro. There is mild interval decrease in size of the  supratentorial ventricles, particularly the temporal horns. Patchy hypodensity of the white matter of the cerebral hemispheres, nonspecific most likely related to small vessel ischemia. Remote lacunar infarct in the right radiata. Vascular: No hyperdense vessel or unexpected calcification. Skull: Negative for fracture or focal lesion. Sinuses/Orbits: No acute finding. Other: None. IMPRESSION: 1. Interval placement of a right frontal approach ventricular drain with the tip at the level of the right foramen of Monro. Interval mild decrease in size of the ventricular system, particularly the temporal horns of the lateral ventricles. 2. Stable appearance of left thalamic intraparenchymal hemorrhage with intraventricular extension. Electronically Signed   By: Baldemar Lenis M.D.   On: 05/15/2021 09:41   CT HEAD WO CONTRAST  Result Date: 05/14/2021 CLINICAL DATA:  Intraparenchymal hemorrhage.  Follow-up. EXAM: CT HEAD WITHOUT CONTRAST TECHNIQUE: Contiguous axial images were obtained from the base of the skull through the vertex without intravenous contrast. COMPARISON:  Same day CT head. FINDINGS: Mildly motion limited exam.  Within this limitation: Brain: When accounting for differences in technique, similar size of the acute intraparenchymal hemorrhage centered in the left thalamus which measures up to 3.3 x 2.2 x 3.3 cm (similar when remeasured on the prior). Similar regional mass effect. Similar intraventricular hemorrhage within the left greater than right lateral ventricles, third ventricle, and fourth ventricle. Similar ventriculomegaly with mild periventricular edema and prominence of the temporal horns. Additional scattered white matter hypoattenuation is nonspecific but most likely related to chronic microvascular ischemic disease. No evidence of acute large vascular territory infarct. Similar suspected prior infarct in the right corona radiata. Vascular: No hyperdense vessel identified.  Calcific atherosclerosis. Skull: No acute fracture. Sinuses/Orbits: No acute findings. Other: No mastoid effusions. IMPRESSION: When accounting for differences in technique, similar size of the acute left thalamic intraparenchymal hemorrhage. Similar intraventricular extension of hemorrhage and ventriculomegaly. Electronically Signed   By: Feliberto Harts MD   On: 05/14/2021 06:21   CT HEAD WO CONTRAST  Result Date: 05/14/2021 CLINICAL DATA:  Intraparenchymal hemorrhage. Follow-up of scans performed at [place] [place] [facility]. EXAM: CT HEAD WITHOUT CONTRAST TECHNIQUE: Contiguous axial images were obtained from the base of the skull through the vertex without intravenous contrast. COMPARISON:  None. I am not able to view the outside institution scans. FINDINGS: Brain: Intraparenchymal hematoma centered in the left thalamus measures 3.2 x 2.2 x 3.0 cm (volume = 11 cm^3). There is moderate hydrocephalus with periventricular hypoattenuation suggesting transependymal interstitial edema. There is rightward bulging of the septum pellucidum. Large amount of blood in the ventricles. Vascular: No abnormal hyperdensity of the major intracranial arteries or dural venous sinuses. No intracranial atherosclerosis. Skull: The visualized skull base, calvarium and extracranial soft tissues are normal. Sinuses/Orbits: No fluid levels or advanced mucosal thickening of the visualized paranasal sinuses. No mastoid or middle ear effusion. The orbits are normal. IMPRESSION: 1. Intraparenchymal hematoma  centered in the left thalamus with intraventricular extension and moderate hydrocephalus with transependymal interstitial edema. Critical Value/emergent results were called by telephone at the time of interpretation on 05/14/2021 at 12:58 am to provider ERIC [facility] , who verbally acknowledged these results. Electronically Signed   By: Deatra Robinson M.D.   On: 05/14/2021 00:58   MR MRA HEAD WO CONTRAST  Result Date:  05/14/2021 CLINICAL DATA:  Intracranial hemorrhage EXAM: MRI HEAD WITHOUT CONTRAST MRA HEAD WITHOUT CONTRAST TECHNIQUE: Multiplanar, multi-echo pulse sequences of the brain and surrounding structures were acquired without intravenous contrast. Angiographic images of the Circle of Willis were acquired using MRA technique without intravenous contrast. COMPARISON: No pertinent prior exam. COMPARISON:  Correlation made with prior head CT FINDINGS: MRI HEAD Brain: Left thalamic hemorrhage is again identified similar to the recent CT with acute blood products. There is mild surrounding edema. Significant intraventricular extension is present as seen on CT with resulting hydrocephalus. Focus susceptibility in the right corona radiata reflects chronic microhemorrhage associated with a lacunar infarct. Additional patchy and confluent areas of T2 hyperintensity in the supratentorial white matter are nonspecific but probably reflect moderate chronic microvascular ischemic changes. No post contrast imaging performed therefore potential underlying lesion is not well evaluated. Vascular: Major vessel flow voids at the skull base are preserved. Skull and upper cervical spine: Normal marrow signal is preserved. Sinuses/Orbits: Paranasal sinuses are aerated. Orbits are unremarkable. Other: Sella is unremarkable.  Mastoid air cells are clear. MRA HEAD Intracranial internal carotid arteries are patent. Middle and anterior cerebral arteries are patent. Intracranial vertebral arteries, basilar artery, posterior cerebral arteries are patent. There is no significant stenosis or aneurysm. No abnormal flow related enhancement in the region of thalamic hemorrhage. IMPRESSION: No substantial change in left thalamic hemorrhage with significant intraventricular extension and hydrocephalus as well as mild edema and mass effect compared to recent CT. 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 Moderate  chronic microvascular ischemic changes. Unremarkable MRA. Electronically Signed   By: Guadlupe Spanish M.D.   On: 05/14/2021 11:18   MR BRAIN WO CONTRAST  Result Date: 05/14/2021 CLINICAL DATA:  Intracranial hemorrhage EXAM: MRI HEAD WITHOUT CONTRAST MRA HEAD WITHOUT CONTRAST TECHNIQUE: Multiplanar, multi-echo pulse sequences of the brain and surrounding structures were acquired without intravenous contrast. Angiographic images of the Circle of Willis were acquired using MRA technique without intravenous contrast. COMPARISON: No pertinent prior exam. COMPARISON:  Correlation made with prior head CT FINDINGS: MRI HEAD Brain: Left thalamic hemorrhage is again identified similar to the recent CT with acute blood products. There is mild surrounding edema. Significant intraventricular extension is present as seen on CT with resulting hydrocephalus. Focus susceptibility in the right corona radiata reflects chronic microhemorrhage associated with a lacunar infarct. Additional patchy and confluent areas of T2 hyperintensity in the supratentorial white matter are nonspecific but probably reflect moderate chronic microvascular ischemic changes. No post contrast imaging performed therefore potential underlying lesion is not well evaluated. Vascular: Major vessel flow voids at the skull base are preserved. Skull and upper cervical spine: Normal marrow signal is preserved. Sinuses/Orbits: Paranasal sinuses are aerated. Orbits are unremarkable. Other: Sella is unremarkable.  Mastoid air cells are clear. MRA HEAD Intracranial internal carotid arteries are patent. Middle and anterior cerebral arteries are patent. Intracranial vertebral arteries, basilar artery, posterior cerebral arteries are patent. There is no significant stenosis or aneurysm. No abnormal flow related enhancement in the region of thalamic hemorrhage. IMPRESSION: No substantial change in left thalamic  hemorrhage with significant intraventricular extension and  hydrocephalus as well as mild edema and mass effect compared to recent CT. 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 Moderate chronic microvascular ischemic changes. Unremarkable MRA. Electronically Signed   By: Guadlupe Spanish M.D.   On: 05/14/2021 11:18   DG Pelvis Portable  Result Date: 05/14/2021 CLINICAL DATA:  60 year old female with intracranial hemorrhage. EXAM: PORTABLE PELVIS 1-2 VIEWS COMPARISON:  None. FINDINGS: Portable AP view at 0346 hours. Mild likely fibroid associated dystrophic calcifications in the pelvis eccentric to the right. Femoral heads normally located. Pelvis and proximal femurs appear intact. No acute osseous abnormality identified. Negative visible bowel gas pattern. IMPRESSION: Negative. Electronically Signed   By: Odessa Fleming M.D.   On: 05/14/2021 06:18   DG CHEST PORT 1 VIEW  Result Date: 05/14/2021 CLINICAL DATA:  Provided history: Follow-up. EXAM: PORTABLE CHEST 1 VIEW COMPARISON:  None. FINDINGS: Cardiomediastinal silhouette is enlarged, likely accentuated by AP technique. Calcific atherosclerosis of the aorta. Low lung volumes. No consolidation. EKG leads bilaterally. No visible pleural effusions or pneumothorax on this single semi erect AP radiograph. IMPRESSION: 1. Low lung volumes without evidence of acute cardiopulmonary disease. 2. Cardiomegaly. Electronically Signed   By: Feliberto Harts MD   On: 05/14/2021 06:13   DG Abd Portable 1V  Result Date: 05/15/2021 CLINICAL DATA:  Feeding tube placement. EXAM: PORTABLE ABDOMEN - 1 VIEW COMPARISON:  None. FINDINGS: Feeding tube is in place with the tip in the distal stomach directed toward the duodenum. IMPRESSION: As above. Electronically Signed   By: Drusilla Kanner M.D.   On: 05/15/2021 11:40   ECHOCARDIOGRAM COMPLETE  Result Date: 05/16/2021    ECHOCARDIOGRAM REPORT   Patient Name:   Marissa Morris Date of Exam: 05/14/2021 Medical Rec #:  [id number]    Height: Accession #:     [id number]   Weight:       166.0 lb Date of Birth:  1961/07/02    BSA:          1.783 m Patient Age:    59 years     BP:           122/69 mmHg Patient Gender: F            HR:           77 bpm. Exam Location:  Inpatient Procedure: 2D Echo, Cardiac Doppler and Color Doppler Indications:    Stroke  History:        Patient has no prior history of Echocardiogram examinations.  Sonographer:    Ross Ludwig RDCS (AE) Referring Phys: [phone] ERIC LINDZEN  Sonographer Comments: Patient not responsive to request to sniff for IVC collapse test. IMPRESSIONS  1. Left ventricular ejection fraction, by estimation, is 50 to 55%. The left ventricle has low normal function. The left ventricle has no regional wall motion abnormalities. There is moderate thickening of the septum. The rest of the LV segments demonstrate mild left ventricular hypertrophy. Left ventricular diastolic parameters are consistent with Grade II diastolic dysfunction (pseudonormalization). Elevated left ventricular end-diastolic pressure.  2. The average left ventricular global longitudinal strain is -6.7 %. The global longitudinal strain is abnormal. Strain pattern shows relative apical sparring which may be suggestive of amyloid in the right clinic context.  3. Right ventricular systolic function is normal. The right ventricular size is normal.  4. The mitral valve is normal in structure. Trivial mitral valve regurgitation. No evidence of mitral stenosis.  5.  The aortic valve is tricuspid. There is mild calcification of the aortic valve. There is mild thickening of the aortic valve. Aortic valve regurgitation is not visualized. Mild aortic valve sclerosis is present, with no evidence of aortic valve stenosis.  6. The inferior vena cava is dilated in size with <50% respiratory variability, suggesting right atrial pressure of 15 mmHg.  7. Left atrial size was mildly dilated. Comparison(s): No prior Echocardiogram. Conclusion(s)/Recommendation(s): No intracardiac  source of embolism detected on this transthoracic study. A transesophageal echocardiogram is recommended to exclude cardiac source of embolism if clinically indicated. FINDINGS  Left Ventricle: Left ventricular ejection fraction, by estimation, is 50 to 55%. The left ventricle has low normal function. The left ventricle has no regional wall motion abnormalities. The average left ventricular global longitudinal strain is -6.7 %.  The global longitudinal strain is abnormal. The left ventricular internal cavity size was normal in size. There is moderate thickening of the septum. The rest of the LV segments demonstrate mild left ventricular hypertrophy. Left ventricular diastolic parameters are consistent with Grade II diastolic dysfunction (pseudonormalization). Elevated left ventricular end-diastolic pressure. Right Ventricle: The right ventricular size is normal. No increase in right ventricular wall thickness. Right ventricular systolic function is normal. Left Atrium: Left atrial size was mildly dilated. Right Atrium: Right atrial size was normal in size. Pericardium: There is no evidence of pericardial effusion. Mitral Valve: The mitral valve is normal in structure. There is mild thickening of the mitral valve leaflet(s). There is mild calcification of the mitral valve leaflet(s). Mild mitral annular calcification. Trivial mitral valve regurgitation. No evidence  of mitral valve stenosis. MV peak gradient, 7.1 mmHg. The mean mitral valve gradient is 4.0 mmHg. Tricuspid Valve: The tricuspid valve is normal in structure. Tricuspid valve regurgitation is trivial. No evidence of tricuspid stenosis. Aortic Valve: The aortic valve is tricuspid. There is mild calcification of the aortic valve. There is mild thickening of the aortic valve. Aortic valve regurgitation is not visualized. Mild aortic valve sclerosis is present, with no evidence of aortic valve stenosis. Aortic valve mean gradient measures 8.0 mmHg. Aortic  valve peak gradient measures 16.3 mmHg. Aortic valve area, by VTI measures 2.05 cm. Pulmonic Valve: The pulmonic valve was normal in structure. Pulmonic valve regurgitation is trivial. No evidence of pulmonic stenosis. Aorta: The aortic root and ascending aorta are structurally normal, with no evidence of dilitation. Venous: The inferior vena cava is dilated in size with less than 50% respiratory variability, suggesting right atrial pressure of 15 mmHg. IAS/Shunts: No atrial level shunt detected by color flow Doppler.  LEFT VENTRICLE PLAX 2D LVIDd:         4.70 cm  Diastology LVIDs:         4.20 cm  LV e' medial:    5.55 cm/s LV PW:         1.60 cm  LV E/e' medial:  23.2 LV IVS:        1.80 cm  LV e' lateral:   6.31 cm/s LVOT diam:     1.80 cm  LV E/e' lateral: 20.4 LV SV:         69 LV SV Index:   39       2D Longitudinal Strain LVOT Area:     2.54 cm 2D Strain GLS Avg:     -6.7 %  RIGHT VENTRICLE             IVC RV Basal diam:  3.50 cm  IVC diam: 2.30 cm RV S prime:     13.20 cm/s TAPSE (M-mode): 3.2 cm LEFT ATRIUM             Index       RIGHT ATRIUM           Index LA diam:        4.10 cm 2.30 cm/m  RA Area:     16.30 cm LA Vol (A2C):   42.1 ml 23.61 ml/m RA Volume:   45.00 ml  25.23 ml/m LA Vol (A4C):   43.6 ml 24.45 ml/m LA Biplane Vol: 47.3 ml 26.52 ml/m  AORTIC VALVE AV Area (Vmax):    1.86 cm AV Area (Vmean):   2.09 cm AV Area (VTI):     2.05 cm AV Vmax:           202.00 cm/s AV Vmean:          135.000 cm/s AV VTI:            0.336 m AV Peak Grad:      16.3 mmHg AV Mean Grad:      8.0 mmHg LVOT Vmax:         148.00 cm/s LVOT Vmean:        111.000 cm/s LVOT VTI:          0.271 m LVOT/AV VTI ratio: 0.81  AORTA Ao Root diam: 2.90 cm Ao Asc diam:  3.20 cm MITRAL VALVE MV Area (PHT): 3.08 cm     SHUNTS MV Area VTI:   1.83 cm     Systemic VTI:  0.27 m MV Peak grad:  7.1 mmHg     Systemic Diam: 1.80 cm MV Mean grad:  4.0 mmHg MV Vmax:       1.33 m/s MV Vmean:      90.6 cm/s MV Decel Time: 246 msec  MV E velocity: 129.00 cm/s MV A velocity: 118.00 cm/s MV E/A ratio:  1.09 Laurance Flatten MD Electronically signed by Laurance Flatten MD Signature Date/Time: 05/16/2021/7:51:23 AM    Final    VAS US CAROTID  Result Date: 05/15/2021 Carotid Arterial Duplex Study Patient Name:  Marissa Morris  Date of Exam:   05/14/2021 Medical Rec #: [id number]     Accession #:    [id number] Date of Birth: [birth year]     Patient Gender: F Patient Age:   72Y Exam [facility]  [facility] Procedure:      VAS US CAROTID Referring Phys: [id number] Marvel Plan --------------------------------------------------------------------------------  Indications:       Hypertensive emergency (122/128). 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 Risk Factors:      Hypertension. Comparison Study:  No prior study on file Performing Technologist: Sherren Kerns RVS  Examination Guidelines: A complete evaluation includes B-mode imaging, spectral Doppler, color Doppler, and power Doppler as needed of all accessible portions of each vessel. Bilateral testing is considered an integral part of a complete examination. Limited examinations for reoccurring indications may be performed as noted.  Right Carotid Findings: +----------+--------+--------+--------+------------------+--------+           PSV cm/sEDV cm/sStenosisPlaque DescriptionComments +----------+--------+--------+--------+------------------+--------+ CCA Prox  96      9                                          +----------+--------+--------+--------+------------------+--------+  CCA Distal104     13                                         +----------+--------+--------+--------+------------------+--------+ ICA Prox  66      15      1-39%   heterogenous               +----------+--------+--------+--------+------------------+--------+ ICA Distal54      13                                          +----------+--------+--------+--------+------------------+--------+ ECA       151     22                                         +----------+--------+--------+--------+------------------+--------+ +----------+--------+-------+--------+-------------------+           PSV cm/sEDV cmsDescribeArm Pressure (mmHG) +----------+--------+-------+--------+-------------------+ Subclavian150                                        +----------+--------+-------+--------+-------------------+ +---------+--------+--+--------+--+---------+ VertebralPSV cm/s41EDV cm/s12Antegrade +---------+--------+--+--------+--+---------+  Left Carotid Findings: +----------+--------+--------+--------+------------------+------------------+           PSV cm/sEDV cm/sStenosisPlaque DescriptionComments           +----------+--------+--------+--------+------------------+------------------+ CCA Prox  180     15                                                   +----------+--------+--------+--------+------------------+------------------+ CCA Distal98      13                                intimal thickening +----------+--------+--------+--------+------------------+------------------+ ICA Prox  70      22      1-39%                     intimal thickening +----------+--------+--------+--------+------------------+------------------+ ICA Distal68      22                                                   +----------+--------+--------+--------+------------------+------------------+ ECA       91      18                                                   +----------+--------+--------+--------+------------------+------------------+ +----------+--------+--------+--------+-------------------+           PSV cm/sEDV cm/sDescribeArm Pressure (mmHG) +----------+--------+--------+--------+-------------------+ [id number]                                           +----------+--------+--------+--------+-------------------+ +---------+--------+--+--------+--+---------+  VertebralPSV cm/s61EDV cm/s13Antegrade +---------+--------+--+--------+--+---------+   Summary: Right Carotid: Velocities in the right ICA are consistent with a 1-39% stenosis. Left Carotid: Velocities in the left ICA are consistent with a 1-39% stenosis. Vertebrals: Bilateral vertebral arteries demonstrate antegrade flow. *See table(s) above for measurements and observations.  Electronically signed by Marvel Plan MD on 05/15/2021 at 2:54:40 PM.    Final      PHYSICAL EXAM  Temp:  [98.4 F (36.9 C)-99.2 F (37.3 C)] 98.8 F (37.1 C) (06/04 0800) Pulse Rate:  [73-104] 95 (06/04 0845) Resp:  [14-35] 19 (06/04 0845) BP: (102-201)/(63-179) 150/81 (06/04 0845) SpO2:  [77 %-98 %] 93 % (06/04 0845)  General - Well nourished, well developed, mildly restless.  Ophthalmologic - fundi not visualized due to noncooperation.  Cardiovascular - Regular rhythm and rate.  Neuro - drowsy, eyes open, awake,  Able to count till 5 with perseveration, follow some simple commands, ft gaze preference and barely cross midline, not blinking to visual threat consistently, PERRL. Right facial droop. Tongue protrusion not cooperative. Spontaneously moving LUE against gravity, no drift, flaccid on the RUE however with pain, bicep 3-/5. Withdraw to pain LLE 3/5, but only mild withdraw 2/5 at RLE. Sensation, coordination not cooperative and gait not tested.   ASSESSMENT/PLAN Marissa Morris is a 60 y.o. female with no significant history admitted for N/V, right sided weakness and facial droop. No tPA given due to ICH.    ICH with IVH:  left thalamic ICH with extensive IVH -  secondary to hypertensive emergency  CT head left thalamic ICH with IVH  Repeat CT head stable ICH and ventricles  MRI  Stable ICH and ventricles  MRA  Unremarkable.   Carotid Doppler  pending  2D Echo  Normal EF 50-55%, left  ventricular global longitudinal strain is  apical sparring which may be suggestive of amyloid in the right clinic  context.    LDL 121  HgbA1c 5.7  SCDs for VTE prophylaxis  No antithrombotic prior to admission, now on No antithrombotic due to ICH  Ongoing aggressive stroke risk factor management  Therapy recommendations:  Pending   Disposition:  Pending   Hydrocephalus   Slightly enlarged ventricles   Repeat CT and MRI showed stable ventricle size  S/p EVD yesterday, draining well 10cc/h  Repeat CT 6/3 mildly decreased ventricle size  On keppra 500mg  bid for seizure prophylaxis   Close monitoring  Hypertensive emergency  . HCTZ 13 mg daily . Lisinoril 20 mg daily  . Carvedilol 12.5 bid . On labetalol PRN . Maintain SBP <160 for now, continue to titrate Clevidipine   Long term BP goal normotensive  Hyperlipidemia  Home meds:  none   LDL 121, goal < 70  Consider statin at discharge  Dysphagia   Did not pass swallow  NPO now  Cortrak        Other Active Problems  Leukocytosis WBC 12.8 .12.5, afebrile  Hospital day # 2  This patient is critically ill due to left thalamic ICH with IVH, s/p EVD, hydrocephalus, hypertensive emergency and at significant risk of neurological worsening, death form brain herniation, obstructive hydrocephalus, seizure, sepsis. This patient's care requires constant monitoring of vital signs, hemodynamics, respiratory and cardiac monitoring, review of multiple databases, neurological assessment, discussion with family, other specialists and medical decision making of high complexity. I spent 45 minutes of neurocritical care time in the care of this patient. I discussed with Dr. . I had long discussion with sister over the phone, updated  pt current condition, treatment plan and potential prognosis, and answered all the questions. She expressed understanding and appreciation.    Rochele Pages, MD  Stroke  Neurology 05/16/2021 9:44 AM    To contact Stroke Continuity provider, please refer to [email or web address]. After hours, contact General Neurology

## 2021-05-17 ENCOUNTER — Inpatient Hospital Stay (HOSPITAL_COMMUNITY): Payer: BLUE CROSS/BLUE SHIELD

## 2021-05-17 DIAGNOSIS — R1310 Dysphagia, unspecified: Secondary | ICD-10-CM

## 2021-05-17 DIAGNOSIS — G9349 Other encephalopathy: Secondary | ICD-10-CM

## 2021-05-17 DIAGNOSIS — I10 Essential (primary) hypertension: Secondary | ICD-10-CM

## 2021-05-17 DIAGNOSIS — I610 Nontraumatic intracerebral hemorrhage in hemisphere, subcortical: Secondary | ICD-10-CM

## 2021-05-17 LAB — URINALYSIS, ROUTINE W REFLEX MICROSCOPIC
Bilirubin Urine: NEGATIVE
Glucose, UA: NEGATIVE mg/dL
Hgb urine dipstick: NEGATIVE
Ketones, ur: NEGATIVE mg/dL
Leukocytes,Ua: NEGATIVE
Nitrite: NEGATIVE
Protein, ur: NEGATIVE mg/dL
Specific Gravity, Urine: 1.021 (ref 1.005–1.030)
pH: 5 (ref 5.0–8.0)

## 2021-05-17 LAB — GLUCOSE, CAPILLARY
Glucose-Capillary: 107 mg/dL — ABNORMAL HIGH (ref 70–99)
Glucose-Capillary: 120 mg/dL — ABNORMAL HIGH (ref 70–99)
Glucose-Capillary: 109 mg/dL — ABNORMAL HIGH (ref 70–99)
Glucose-Capillary: 127 mg/dL — ABNORMAL HIGH (ref 70–99)
Glucose-Capillary: 117 mg/dL — ABNORMAL HIGH (ref 70–99)

## 2021-05-17 LAB — BASIC METABOLIC PANEL
Anion gap: 13 (ref 5–15)
BUN: 21 mg/dL — ABNORMAL HIGH (ref 6–20)
CO2: 25 mmol/L (ref 22–32)
Calcium: 8.6 mg/dL — ABNORMAL LOW (ref 8.9–10.3)
Chloride: 101 mmol/L (ref 98–111)
Creatinine, Ser: 0.92 mg/dL (ref 0.44–1.00)
GFR, Estimated: >60 mL/min (ref >60)
Glucose, Bld: 119 mg/dL — ABNORMAL HIGH (ref 70–99)
Potassium: 3.7 mmol/L (ref 3.5–5.1)
Sodium: 139 mmol/L (ref 135–145)

## 2021-05-17 LAB — CBC
HCT: 44.8 % (ref 36.0–46.0)
Hemoglobin: 15 g/dL (ref 12.0–15.0)
MCH: 32 pg (ref 26.0–34.0)
MCHC: 33.5 g/dL (ref 30.0–36.0)
MCV: 95.5 fL (ref 80.0–100.0)
Platelets: 201 10*3/uL (ref 150–400)
RBC: 4.69 MIL/uL (ref 3.87–5.11)
RDW: 12.9 % (ref 11.5–15.5)
WBC: 12.9 10*3/uL — ABNORMAL HIGH (ref 4.0–10.5)
nRBC: 0 % (ref 0.0–0.2)

## 2021-05-17 IMAGING — DX DG CHEST 1V PORT
1 series · 1 of 1 positions shown · non-contrast
Comparison: [DATE]

CLINICAL DATA: Respiratory failure

EXAM:
PORTABLE CHEST 1 VIEW

[chest ap]
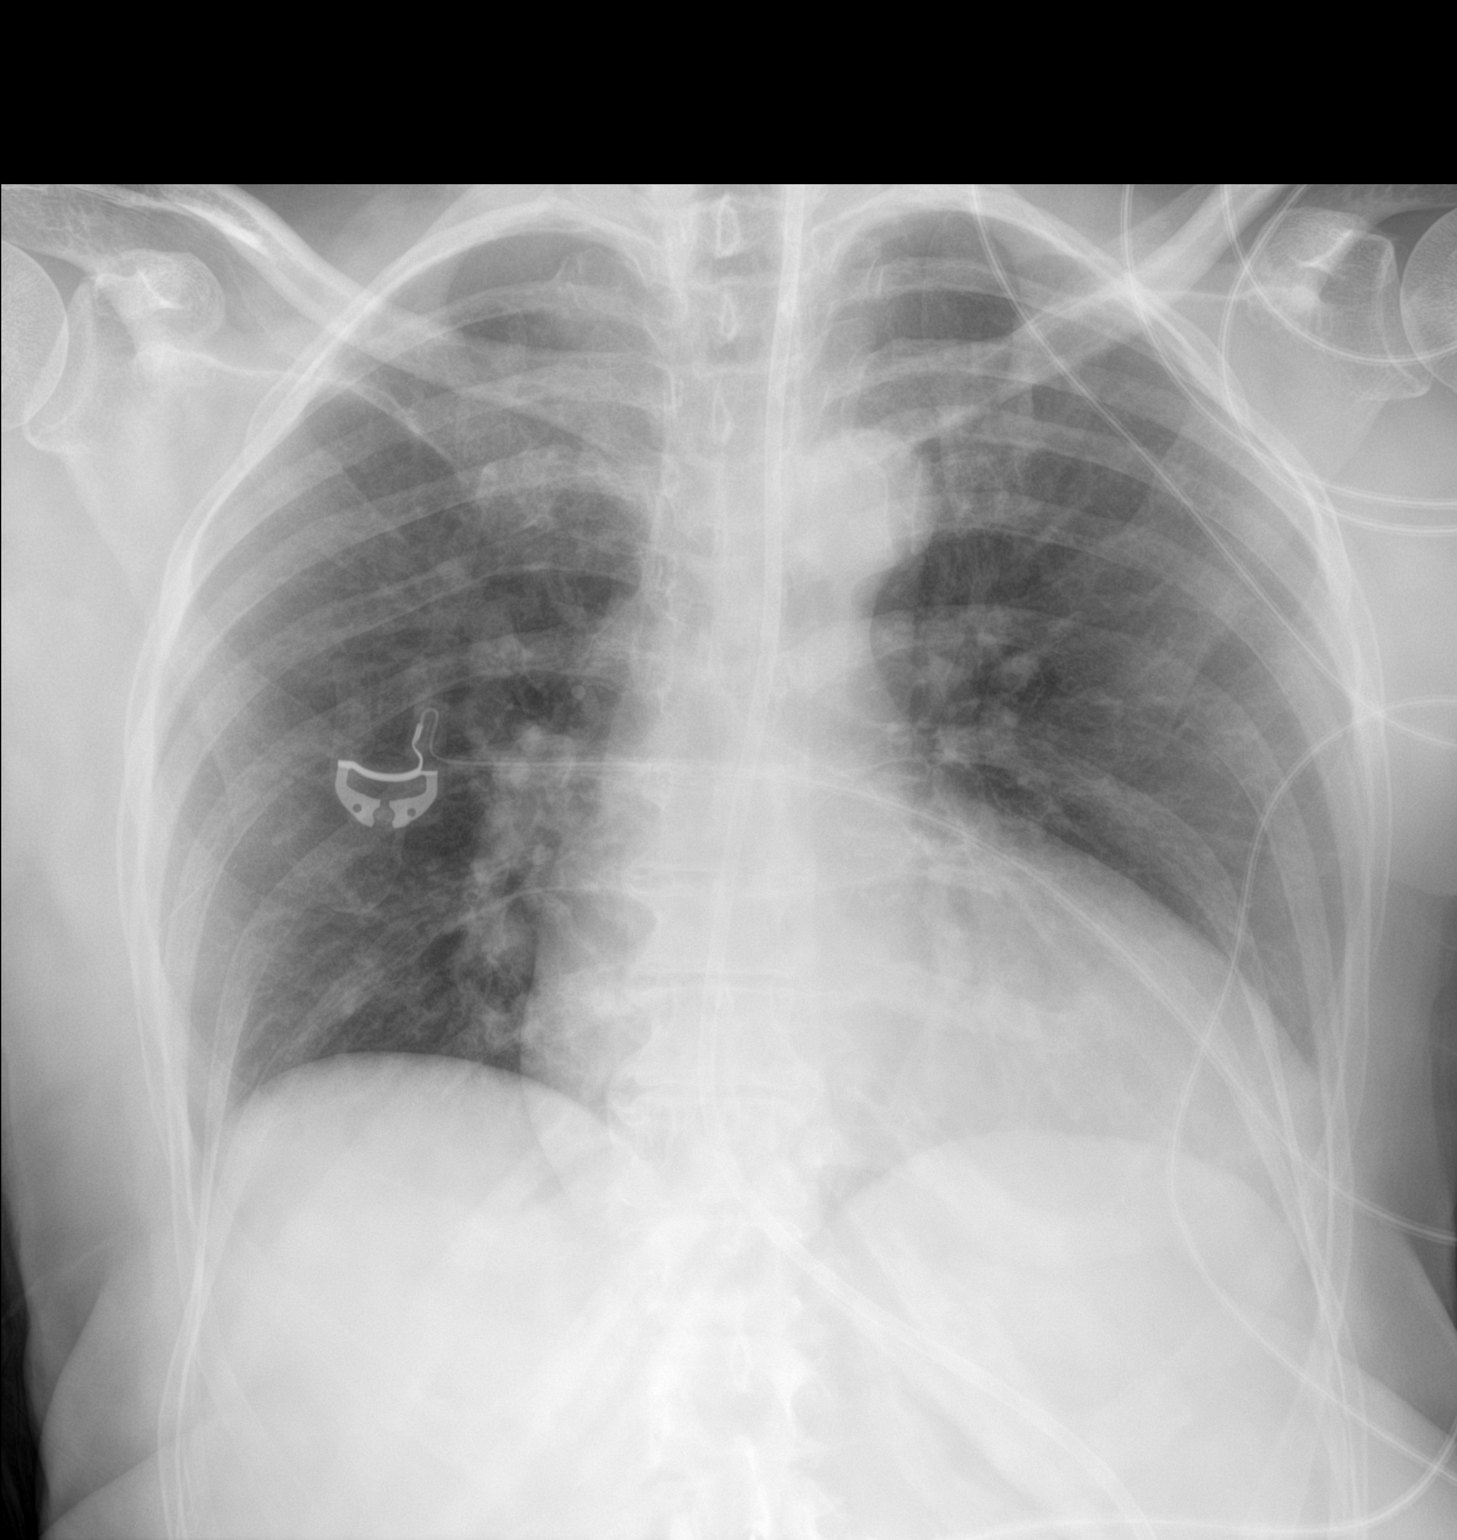

[1 of 1 positions shown; findings below may reference images not displayed]

FINDINGS: Feeding tube tip is below the diaphragm. No pneumothorax. There is
no edema or airspace opacity. There is cardiomegaly with pulmonary
vascularity normal. No adenopathy. No bone lesions.
IMPRESSION: No edema or airspace opacity. Cardiomegaly, stable. Feeding tube tip
below diaphragm.

## 2021-05-17 MED ORDER — LEVETIRACETAM 100 MG/ML PO SOLN
500.0000 mg | Freq: Two times a day (BID) | ORAL | Status: AC
  Administered 2021-05-17 – 2021-05-20 (×8): 500 mg
  Filled 2021-05-17 (×8): qty 5

## 2021-05-17 NOTE — Progress Notes (Signed)
STROKE TEAM PROGRESS NOTE   SUBJECTIVE (INTERVAL HISTORY) Pt was agitated last night was given Zyprexa with good effect. Mildly sleepy today but following commands.  EVD ddraining well. Pt neurologically unchanged. Still has left gaze preference and right hemiparesis.    OBJECTIVE Temp:  [97.7 F (36.5 C)-102.1 F (38.9 C)] 97.7 F (36.5 C) (06/05 0800) Pulse Rate:  [72-105] 82 (06/05 0900) Cardiac Rhythm: Normal sinus rhythm (06/05 0800) Resp:  [12-30] 18 (06/05 0900) BP: (100-180)/(64-142) 140/86 (06/05 0947) SpO2:  [84 %-96 %] 95 % (06/05 0900) Weight:  [80.9 kg] 80.9 kg (06/05 0400)  Recent Labs  Lab 05/16/21 1618 05/16/21 2002 05/16/21 2333 05/17/21 0341 05/17/21 0831  GLUCAP 145* 135* 131* 107* 120*   Recent Labs  Lab 05/14/21 0119 05/15/21 1340 05/15/21 1735 05/16/21 0605 05/16/21 1659 05/17/21 0318  NA 141 136  --  136  --  139  K 3.5 3.7  --  3.8  --  3.7  CL 105 104  --  101  --  101  CO2 27 22  --  24  --  25  GLUCOSE 117* 93  --  163*  --  119*  BUN 10 10  --  12  --  21*  CREATININE 0.83 0.64  --  0.74  --  0.92  CALCIUM 8.8* 8.2*  --  8.3*  --  8.6*  MG  --  2.1 2.3 2.5* 2.4  --   PHOS  --  3.8 3.4 3.6 4.3  --    Recent Labs  Lab 05/14/21 0119  AST 33  ALT 23  ALKPHOS 84  BILITOT 0.7  PROT 7.6  ALBUMIN 4.1   Recent Labs  Lab 05/14/21 0119 05/16/21 0605 05/17/21 0318  WBC 12.8* 12.5* 12.9*  HGB 13.6 15.3* 15.0  HCT 42.9 44.5 44.8  MCV 99.8 94.9 95.5  PLT 192 186 201   No results for input(s): CKTOTAL, CKMB, CKMBINDEX, TROPONINI in the last 168 hours. No results for input(s): LABPROT, INR in the last 72 hours. No results for input(s): COLORURINE, LABSPEC, PHURINE, GLUCOSEU, HGBUR, BILIRUBINUR, KETONESUR, PROTEINUR, UROBILINOGEN, NITRITE, LEUKOCYTESUR in the last 72 hours.  Invalid input(s): APPERANCEUR     Component Value Date/Time   CHOL 203 (H) 05/14/2021 0022   TRIG 71 05/14/2021 0022   HDL 68 05/14/2021 0022   CHOLHDL 3.0  05/14/2021 0022   VLDL 14 05/14/2021 0022   LDLCALC 121 (H) 05/14/2021 0022   Lab Results  Component Value Date   HGBA1C 5.7 (H) 05/14/2021      Component Value Date/Time   LABOPIA NONE DETECTED 05/14/2021 0254   COCAINSCRNUR NONE DETECTED 05/14/2021 0254   LABBENZ NONE DETECTED 05/14/2021 0254   AMPHETMU NONE DETECTED 05/14/2021 0254   THCU NONE DETECTED 05/14/2021 0254   LABBARB NONE DETECTED 05/14/2021 0254    No results for input(s): ETH in the last 168 hours.  I have personally reviewed the radiological images below and agree with the radiology interpretations.  DG Abd 1 View  Result Date: 05/14/2021 CLINICAL DATA:  60 year old female with intracranial hemorrhage. EXAM: ABDOMEN - 1 VIEW COMPARISON:  Portable pelvis radiograph today reported separately. FINDINGS: Portable AP supine view at 0344 hours. Non obstructed bowel gas pattern. No definite pneumoperitoneum on this supine view. Minimally included left lung base appears grossly negative. Scoliosis and degenerative changes in the spine. Pelvic dystrophic and/or vascular calcifications, mild. No acute osseous abnormality identified. IMPRESSION: 1. Normal bowel gas pattern. 2. Spinal scoliosis and degeneration.  Electronically Signed   By: Odessa Fleming M.D.   On: 05/14/2021 06:19   CT HEAD WO CONTRAST  Result Date: 05/15/2021 CLINICAL DATA:  Cerebral hemorrhage suspected. EXAM: CT HEAD WITHOUT CONTRAST TECHNIQUE: Contiguous axial images were obtained from the base of the skull through the vertex without intravenous contrast. COMPARISON:  MRI and CT of the brain May 14, 2021. FINDINGS: Brain: No significant interval change size of the left thalamic hemorrhage with hematoma now measuring approximately 3.5 x 3.3 x 2 cm compared to 3.3 x 3.3 x 2.2 cm on prior. Persistent hypodensity surrounding the hematoma related to vasogenic edema. Supratentorial and infratentorial intraventricular hemorrhage related to hematoma decompression into the right  lateral ventricle, status post right frontal approach ventricular drain placement. Drain tip is at level of the right foramen of Monro. There is mild interval decrease in size of the supratentorial ventricles, particularly the temporal horns. Patchy hypodensity of the white matter of the cerebral hemispheres, nonspecific most likely related to small vessel ischemia. Remote lacunar infarct in the right radiata. Vascular: No hyperdense vessel or unexpected calcification. Skull: Negative for fracture or focal lesion. Sinuses/Orbits: No acute finding. Other: None. IMPRESSION: 1. Interval placement of a right frontal approach ventricular drain with the tip at the level of the right foramen of Monro. Interval mild decrease in size of the ventricular system, particularly the temporal horns of the lateral ventricles. 2. Stable appearance of left thalamic intraparenchymal hemorrhage with intraventricular extension. Electronically Signed   By: Baldemar Lenis M.D.   On: 05/15/2021 09:41   CT HEAD WO CONTRAST  Result Date: 05/14/2021 CLINICAL DATA:  Intraparenchymal hemorrhage.  Follow-up. EXAM: CT HEAD WITHOUT CONTRAST TECHNIQUE: Contiguous axial images were obtained from the base of the skull through the vertex without intravenous contrast. COMPARISON:  Same day CT head. FINDINGS: Mildly motion limited exam.  Within this limitation: Brain: When accounting for differences in technique, similar size of the acute intraparenchymal hemorrhage centered in the left thalamus which measures up to 3.3 x 2.2 x 3.3 cm (similar when remeasured on the prior). Similar regional mass effect. Similar intraventricular hemorrhage within the left greater than right lateral ventricles, third ventricle, and fourth ventricle. Similar ventriculomegaly with mild periventricular edema and prominence of the temporal horns. Additional scattered white matter hypoattenuation is nonspecific but most likely related to chronic microvascular  ischemic disease. No evidence of acute large vascular territory infarct. Similar suspected prior infarct in the right corona radiata. Vascular: No hyperdense vessel identified. Calcific atherosclerosis. Skull: No acute fracture. Sinuses/Orbits: No acute findings. Other: No mastoid effusions. IMPRESSION: When accounting for differences in technique, similar size of the acute left thalamic intraparenchymal hemorrhage. Similar intraventricular extension of hemorrhage and ventriculomegaly. Electronically Signed   By: Feliberto Harts MD   On: 05/14/2021 06:21   CT HEAD WO CONTRAST  Result Date: 05/14/2021 CLINICAL DATA:  Intraparenchymal hemorrhage. Follow-up of scans performed at [place], [place] [facility]. EXAM: CT HEAD WITHOUT CONTRAST TECHNIQUE: Contiguous axial images were obtained from the base of the skull through the vertex without intravenous contrast. COMPARISON:  None. I am not able to view the outside institution scans. FINDINGS: Brain: Intraparenchymal hematoma centered in the left thalamus measures 3.2 x 2.2 x 3.0 cm (volume = 11 cm^3). There is moderate hydrocephalus with periventricular hypoattenuation suggesting transependymal interstitial edema. There is rightward bulging of the septum pellucidum. Large amount of blood in the ventricles. Vascular: No abnormal hyperdensity of the major intracranial arteries or dural venous sinuses. No intracranial atherosclerosis.  Skull: The visualized skull base, calvarium and extracranial soft tissues are normal. Sinuses/Orbits: No fluid levels or advanced mucosal thickening of the visualized paranasal sinuses. No mastoid or middle ear effusion. The orbits are normal. IMPRESSION: 1. Intraparenchymal hematoma centered in the left thalamus with intraventricular extension and moderate hydrocephalus with transependymal interstitial edema. Critical Value/emergent results were called by telephone at the time of interpretation on 05/14/2021 at 12:58 am to provider ERIC  [employer] OneIND[employer]EN , who verbally acknowledged these results. Electronically Signed   By: Deatra RobinsonKevin  Herman M.D.   On: 05/14/2021 00:58   MR MRA HEAD WO CONTRAST  Result Date: 05/14/2021 CLINICAL DATA:  Intracranial hemorrhage EXAM: MRI HEAD WITHOUT CONTRAST MRA HEAD WITHOUT CONTRAST TECHNIQUE: Multiplanar, multi-echo pulse sequences of the brain and surrounding structures were acquired without intravenous contrast. Angiographic images of the Circle of Willis were acquired using MRA technique without intravenous contrast. COMPARISON: No pertinent prior exam. COMPARISON:  Correlation made with prior head CT FINDINGS: MRI HEAD Brain: Left thalamic hemorrhage is again identified similar to the recent CT with acute blood products. There is mild surrounding edema. Significant intraventricular extension is present as seen on CT with resulting hydrocephalus. Focus susceptibility in the right corona radiata reflects chronic microhemorrhage associated with a lacunar infarct. Additional patchy and confluent areas of T2 hyperintensity in the supratentorial white matter are nonspecific but probably reflect moderate chronic microvascular ischemic changes. No post contrast imaging performed therefore potential underlying lesion is not well evaluated. Vascular: Major vessel flow voids at the skull base are preserved. Skull and upper cervical spine: Normal marrow signal is preserved. Sinuses/Orbits: Paranasal sinuses are aerated. Orbits are unremarkable. Other: Sella is unremarkable.  Mastoid air cells are clear. MRA HEAD Intracranial internal carotid arteries are patent. Middle and anterior cerebral arteries are patent. Intracranial vertebral arteries, basilar artery, posterior cerebral arteries are patent. There is no significant stenosis or aneurysm. No abnormal flow related enhancement in the region of thalamic hemorrhage. IMPRESSION: No substantial change in left thalamic hemorrhage with significant intraventricular extension and  hydrocephalus as well as mild edema and mass effect compared to recent CT. 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 Moderate chronic microvascular ischemic changes. Unremarkable MRA. Electronically Signed   By: Guadlupe SpanishPraneil  Patel M.D.   On: 05/14/2021 11:18   MR BRAIN WO CONTRAST  Result Date: 05/14/2021 CLINICAL DATA:  Intracranial hemorrhage EXAM: MRI HEAD WITHOUT CONTRAST MRA HEAD WITHOUT CONTRAST TECHNIQUE: Multiplanar, multi-echo pulse sequences of the brain and surrounding structures were acquired without intravenous contrast. Angiographic images of the Circle of Willis were acquired using MRA technique without intravenous contrast. COMPARISON: No pertinent prior exam. COMPARISON:  Correlation made with prior head CT FINDINGS: MRI HEAD Brain: Left thalamic hemorrhage is again identified similar to the recent CT with acute blood products. There is mild surrounding edema. Significant intraventricular extension is present as seen on CT with resulting hydrocephalus. Focus susceptibility in the right corona radiata reflects chronic microhemorrhage associated with a lacunar infarct. Additional patchy and confluent areas of T2 hyperintensity in the supratentorial white matter are nonspecific but probably reflect moderate chronic microvascular ischemic changes. No post contrast imaging performed therefore potential underlying lesion is not well evaluated. Vascular: Major vessel flow voids at the skull base are preserved. Skull and upper cervical spine: Normal marrow signal is preserved. Sinuses/Orbits: Paranasal sinuses are aerated. Orbits are unremarkable. Other: Sella is unremarkable.  Mastoid air cells are clear. MRA HEAD Intracranial internal carotid arteries are patent. Middle and anterior  cerebral arteries are patent. Intracranial vertebral arteries, basilar artery, posterior cerebral arteries are patent. There is no significant stenosis or aneurysm. No abnormal flow  related enhancement in the region of thalamic hemorrhage. IMPRESSION: No substantial change in left thalamic hemorrhage with significant intraventricular extension and hydrocephalus as well as mild edema and mass effect compared to recent CT. 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 Moderate chronic microvascular ischemic changes. Unremarkable MRA. Electronically Signed   By: Guadlupe Spanish M.D.   On: 05/14/2021 11:18   DG Pelvis Portable  Result Date: 05/14/2021 CLINICAL DATA:  60 year old female with intracranial hemorrhage. EXAM: PORTABLE PELVIS 1-2 VIEWS COMPARISON:  None. FINDINGS: Portable AP view at 0346 hours. Mild likely fibroid associated dystrophic calcifications in the pelvis eccentric to the right. Femoral heads normally located. Pelvis and proximal femurs appear intact. No acute osseous abnormality identified. Negative visible bowel gas pattern. IMPRESSION: Negative. Electronically Signed   By: Odessa Fleming M.D.   On: 05/14/2021 06:18   DG CHEST PORT 1 VIEW  Result Date: 05/14/2021 CLINICAL DATA:  Provided history: Follow-up. EXAM: PORTABLE CHEST 1 VIEW COMPARISON:  None. FINDINGS: Cardiomediastinal silhouette is enlarged, likely accentuated by AP technique. Calcific atherosclerosis of the aorta. Low lung volumes. No consolidation. EKG leads bilaterally. No visible pleural effusions or pneumothorax on this single semi erect AP radiograph. IMPRESSION: 1. Low lung volumes without evidence of acute cardiopulmonary disease. 2. Cardiomegaly. Electronically Signed   By: Feliberto Harts MD   On: 05/14/2021 06:13   DG Abd Portable 1V  Result Date: 05/15/2021 CLINICAL DATA:  Feeding tube placement. EXAM: PORTABLE ABDOMEN - 1 VIEW COMPARISON:  None. FINDINGS: Feeding tube is in place with the tip in the distal stomach directed toward the duodenum. IMPRESSION: As above. Electronically Signed   By: Drusilla Kanner M.D.   On: 05/15/2021 11:40   ECHOCARDIOGRAM  COMPLETE  Result Date: 05/16/2021    ECHOCARDIOGRAM REPORT   Patient Name:   ZAIN BINGMAN Date of Exam: 05/14/2021 Medical Rec #:  [id number]    Height: Accession #:    [id number]   Weight:       166.0 lb Date of Birth:  [birth year]    BSA:          1.783 m Patient Age:    59 years     BP:           122/69 mmHg Patient Gender: F            HR:           77 bpm. Exam Location:  Inpatient Procedure: 2D Echo, Cardiac Doppler and Color Doppler Indications:    Stroke  History:        Patient has no prior history of Echocardiogram examinations.  Sonographer:    Ross Ludwig RDCS (AE) Referring Phys: [phone] ERIC LINDZEN  Sonographer Comments: Patient not responsive to request to sniff for IVC collapse test. IMPRESSIONS  1. Left ventricular ejection fraction, by estimation, is 50 to 55%. The left ventricle has low normal function. The left ventricle has no regional wall motion abnormalities. There is moderate thickening of the septum. The rest of the LV segments demonstrate mild left ventricular hypertrophy. Left ventricular diastolic parameters are consistent with Grade II diastolic dysfunction (pseudonormalization). Elevated left ventricular end-diastolic pressure.  2. The average left ventricular global longitudinal strain is -6.7 %. The global longitudinal strain is abnormal. Strain pattern shows relative apical sparring which may be suggestive of amyloid  in the right clinic context.  3. Right ventricular systolic function is normal. The right ventricular size is normal.  4. The mitral valve is normal in structure. Trivial mitral valve regurgitation. No evidence of mitral stenosis.  5. The aortic valve is tricuspid. There is mild calcification of the aortic valve. There is mild thickening of the aortic valve. Aortic valve regurgitation is not visualized. Mild aortic valve sclerosis is present, with no evidence of aortic valve stenosis.  6. The inferior vena cava is dilated in size with <50% respiratory variability, suggesting  right atrial pressure of 15 mmHg.  7. Left atrial size was mildly dilated. Comparison(s): No prior Echocardiogram. Conclusion(s)/Recommendation(s): No intracardiac source of embolism detected on this transthoracic study. A transesophageal echocardiogram is recommended to exclude cardiac source of embolism if clinically indicated. FINDINGS  Left Ventricle: Left ventricular ejection fraction, by estimation, is 50 to 55%. The left ventricle has low normal function. The left ventricle has no regional wall motion abnormalities. The average left ventricular global longitudinal strain is -6.7 %.  The global longitudinal strain is abnormal. The left ventricular internal cavity size was normal in size. There is moderate thickening of the septum. The rest of the LV segments demonstrate mild left ventricular hypertrophy. Left ventricular diastolic parameters are consistent with Grade II diastolic dysfunction (pseudonormalization). Elevated left ventricular end-diastolic pressure. Right Ventricle: The right ventricular size is normal. No increase in right ventricular wall thickness. Right ventricular systolic function is normal. Left Atrium: Left atrial size was mildly dilated. Right Atrium: Right atrial size was normal in size. Pericardium: There is no evidence of pericardial effusion. Mitral Valve: The mitral valve is normal in structure. There is mild thickening of the mitral valve leaflet(s). There is mild calcification of the mitral valve leaflet(s). Mild mitral annular calcification. Trivial mitral valve regurgitation. No evidence  of mitral valve stenosis. MV peak gradient, 7.1 mmHg. The mean mitral valve gradient is 4.0 mmHg. Tricuspid Valve: The tricuspid valve is normal in structure. Tricuspid valve regurgitation is trivial. No evidence of tricuspid stenosis. Aortic Valve: The aortic valve is tricuspid. There is mild calcification of the aortic valve. There is mild thickening of the aortic valve. Aortic valve  regurgitation is not visualized. Mild aortic valve sclerosis is present, with no evidence of aortic valve stenosis. Aortic valve mean gradient measures 8.0 mmHg. Aortic valve peak gradient measures 16.3 mmHg. Aortic valve area, by VTI measures 2.05 cm. Pulmonic Valve: The pulmonic valve was normal in structure. Pulmonic valve regurgitation is trivial. No evidence of pulmonic stenosis. Aorta: The aortic root and ascending aorta are structurally normal, with no evidence of dilitation. Venous: The inferior vena cava is dilated in size with less than 50% respiratory variability, suggesting right atrial pressure of 15 mmHg. IAS/Shunts: No atrial level shunt detected by color flow Doppler.  LEFT VENTRICLE PLAX 2D LVIDd:         4.70 cm  Diastology LVIDs:         4.20 cm  LV e' medial:    5.55 cm/s LV PW:         1.60 cm  LV E/e' medial:  23.2 LV IVS:        1.80 cm  LV e' lateral:   6.31 cm/s LVOT diam:     1.80 cm  LV E/e' lateral: 20.4 LV SV:         69 LV SV Index:   39       2D Longitudinal Strain LVOT Area:  2.54 cm 2D Strain GLS Avg:     -6.7 %  RIGHT VENTRICLE             IVC RV Basal diam:  3.50 cm     IVC diam: 2.30 cm RV S prime:     13.20 cm/s TAPSE (M-mode): 3.2 cm LEFT ATRIUM             Index       RIGHT ATRIUM           Index LA diam:        4.10 cm 2.30 cm/m  RA Area:     16.30 cm LA Vol (A2C):   42.1 ml 23.61 ml/m RA Volume:   45.00 ml  25.23 ml/m LA Vol (A4C):   43.6 ml 24.45 ml/m LA Biplane Vol: 47.3 ml 26.52 ml/m  AORTIC VALVE AV Area (Vmax):    1.86 cm AV Area (Vmean):   2.09 cm AV Area (VTI):     2.05 cm AV Vmax:           202.00 cm/s AV Vmean:          135.000 cm/s AV VTI:            0.336 m AV Peak Grad:      16.3 mmHg AV Mean Grad:      8.0 mmHg LVOT Vmax:         148.00 cm/s LVOT Vmean:        111.000 cm/s LVOT VTI:          0.271 m LVOT/AV VTI ratio: 0.81  AORTA Ao Root diam: 2.90 cm Ao Asc diam:  3.20 cm MITRAL VALVE MV Area (PHT): 3.08 cm     SHUNTS MV Area VTI:   1.83 cm      Systemic VTI:  0.27 m MV Peak grad:  7.1 mmHg     Systemic Diam: 1.80 cm MV Mean grad:  4.0 mmHg MV Vmax:       1.33 m/s MV Vmean:      90.6 cm/s MV Decel Time: 246 msec MV E velocity: 129.00 cm/s MV A velocity: 118.00 cm/s MV E/A ratio:  1.09 Laurance Flatten MD Electronically signed by Laurance Flatten MD Signature Date/Time: 05/16/2021/7:51:23 AM    Final    VAS US CAROTID  Result Date: 05/15/2021 Carotid Arterial Duplex Study Patient Name:  RUMOR SUN  Date of Exam:   05/14/2021 Medical Rec #: [id number]     Accession #:    [id number] Date of Birth: 01/02/1961     Patient Gender: F Patient Age:   66Y Exam Location:  [facility] Procedure:      VAS US CAROTID Referring Phys: [id number] Marvel Plan --------------------------------------------------------------------------------  Indications:       Hypertensive emergency (122/128). 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 Risk Factors:      Hypertension. Comparison Study:  No prior study on file Performing Technologist: Sherren Kerns RVS  Examination Guidelines: A complete evaluation includes B-mode imaging, spectral Doppler, color Doppler, and power Doppler as needed of all accessible portions of each vessel. Bilateral testing is considered an integral part of a complete examination. Limited examinations for reoccurring indications may be performed as noted.  Right Carotid Findings: +----------+--------+--------+--------+------------------+--------+           PSV cm/sEDV cm/sStenosisPlaque DescriptionComments +----------+--------+--------+--------+------------------+--------+ CCA Prox  96  9                                          +----------+--------+--------+--------+------------------+--------+ CCA Distal104     13                                         +----------+--------+--------+--------+------------------+--------+ ICA Prox  66      15      1-39%   heterogenous                +----------+--------+--------+--------+------------------+--------+ ICA Distal54      13                                         +----------+--------+--------+--------+------------------+--------+ ECA       151     22                                         +----------+--------+--------+--------+------------------+--------+ +----------+--------+-------+--------+-------------------+           PSV cm/sEDV cmsDescribeArm Pressure (mmHG) +----------+--------+-------+--------+-------------------+ Subclavian150                                        +----------+--------+-------+--------+-------------------+ +---------+--------+--+--------+--+---------+ VertebralPSV cm/s41EDV cm/s12Antegrade +---------+--------+--+--------+--+---------+  Left Carotid Findings: +----------+--------+--------+--------+------------------+------------------+           PSV cm/sEDV cm/sStenosisPlaque DescriptionComments           +----------+--------+--------+--------+------------------+------------------+ CCA Prox  180     15                                                   +----------+--------+--------+--------+------------------+------------------+ CCA Distal98      13                                intimal thickening +----------+--------+--------+--------+------------------+------------------+ ICA Prox  70      22      1-39%                     intimal thickening +----------+--------+--------+--------+------------------+------------------+ ICA Distal68      22                                                   +----------+--------+--------+--------+------------------+------------------+ ECA       91      18                                                   +----------+--------+--------+--------+------------------+------------------+ +----------+--------+--------+--------+-------------------+           PSV cm/sEDV  cm/sDescribeArm Pressure (mmHG)  +----------+--------+--------+--------+-------------------+ Subclavian87                                          +----------+--------+--------+--------+-------------------+ +---------+--------+--+--------+--+---------+ VertebralPSV cm/s61EDV cm/s13Antegrade +---------+--------+--+--------+--+---------+   Summary: Right Carotid: Velocities in the right ICA are consistent with a 1-39% stenosis. Left Carotid: Velocities in the left ICA are consistent with a 1-39% stenosis. Vertebrals: Bilateral vertebral arteries demonstrate antegrade flow. *See table(s) above for measurements and observations.  Electronically signed by Marvel Plan MD on 05/15/2021 at 2:54:40 PM.    Final      PHYSICAL EXAM  Temp:  [97.7 F (36.5 C)-102.1 F (38.9 C)] 97.7 F (36.5 C) (06/05 0800) Pulse Rate:  [72-105] 82 (06/05 0900) Resp:  [12-30] 18 (06/05 0900) BP: (100-180)/(64-142) 140/86 (06/05 0947) SpO2:  [84 %-96 %] 95 % (06/05 0900) Weight:  [80.9 kg] 80.9 kg (06/05 0400)  General - Well nourished, well developed, mildly restless.  Ophthalmologic - fundi not visualized due to noncooperation.  Cardiovascular - Regular rhythm and rate.  Neuro - drowsy, eyes open, awake,  Able to count till 5 with perseveration, follow some simple commands, ft gaze preference and barely cross midline, not blinking to visual threat consistently, PERRL. Right facial droop. Tongue protrusion not cooperative. Spontaneously moving LUE against gravity, no drift, flaccid on the RUE however with pain, bicep 3-/5. Withdraw to pain LLE 3/5, but only mild withdraw 2/5 at RLE. Sensation, coordination not cooperative and gait not tested.   ASSESSMENT/PLAN Ms. Tenise Stetler is a 60 y.o. female with no significant history admitted for N/V, right sided weakness and facial droop. No tPA given due to ICH.    ICH with IVH:  left thalamic ICH with extensive IVH -  secondary to hypertensive emergency  CT head left thalamic ICH with  IVH  Repeat CT head stable ICH and ventricles  MRI  Stable ICH and ventricles  MRA  Unremarkable.   Carotid Doppler  <50% BL  2D Echo  Normal EF 50-55%, left ventricular global longitudinal strain is  apical sparring which may be suggestive of amyloid in the right clinic  context.    LDL 121  HgbA1c 5.7  SCDs for VTE prophylaxis, can start chemical prophylaxis when drain is off  No antithrombotic prior to admission, now on No antithrombotic due to ICH  Ongoing aggressive stroke risk factor management  Therapy recommendations:  CIR  Disposition:  Pending   Hydrocephalus   Slightly enlarged ventricles   Repeat CT and MRI showed stable ventricle size  S/p EVD yesterday, draining well 10cc/h  Repeat CT 6/3 mildly decreased ventricle size  On keppra  bid for seizure prophylaxis   Close monitoring  Hypertensive emergency  . HCTZ 13 mg daily . Lisinoril 20 mg daily  . Carvedilol 12.5 bid . On labetalol PRN . Maintain SBP <160 for now, continue to titrate Clevidipine   Long term BP goal normotensive  Hyperlipidemia  Home meds:  none   LDL 121, goal < 70  Consider statin at discharge  Dysphagia   Did not pass swallow  NPO now  Cortrak    Other Active Problems Fever overnight with increasing WBC> No clear sign of infection, ventriculostomy site looks good, no cough, no cough, no dysuria;  check urinalysis, check CXR, monitor drain site   ICU team following   Hospital day # 3  This patient is critically  ill due to left thalamic ICH with IVH, s/p EVD, hydrocephalus, hypertensive emergency and at significant risk of neurological worsening, death form brain herniation, obstructive hydrocephalus, seizure, sepsis. This patient's care requires constant monitoring of vital signs, hemodynamics, respiratory and cardiac monitoring, review of multiple databases, neurological assessment, discussion with family, other specialists and medical decision making of  high complexity.   Rochele Pages, MD  Stroke Neurology 05/17/2021 10:09 AM    To contact Stroke Continuity provider, please refer to [email or web address]. After hours, contact General Neurology

## 2021-05-17 NOTE — Progress Notes (Signed)
NEUROSURGERY PROGRESS NOTE  Doing well. No acute events overnight. EVD still set at 10 and draining appropriately. No new nsgy recom.   Temp:  [97.7 F (36.5 C)-102.1 F (38.9 C)] 97.7 F (36.5 C) (06/05 0800) Pulse Rate:  [72-105] 77 (06/05 1000) Resp:  [12-30] 19 (06/05 1000) BP: (100-180)/(64-142) 137/75 (06/05 1000) SpO2:  [84 %-96 %] 96 % (06/05 1000) Weight:  [80.9 kg] 80.9 kg (06/05 0400)   Sherryl Manges, NP 05/17/2021 10:53 AM

## 2021-05-17 NOTE — Progress Notes (Signed)
NAMELawrie Morris, MRN:  [id number], DOB:  1961/10/26, LOS: 3 ADMISSION DATE:  05/13/2021, CONSULTATION DATE:  05/14/21 REFERRING MD:  , CHIEF COMPLAINT:   intracranial hemorrhage  HPI/course in hospital  Marissa Morris a 60 y.o.femalepresenting to [facility]  after transfer from [facility] [phone]) for management of acute intracranial hemorrhage.  Patient was very somnolent and unable to answer questions coherently.  Per notes and records from [place], EMS had been called to the patient's home and on arrival the patient was found to be slumped over in a chair. Sheprojectilevomited and thenwas noted to beflaccid on the right with right facial droop and inability to correctly answer questions On arrival to the ED, BP was 222/128. CT head revealed a 3 cm thalamic hemorrhage with intraventricular extension.  Initial NIHSS was 25. Initial exam by EDP at OSH: Patient awake with slow but comprehensible speech. Oriented to person. Confused. CN were normal. LUE and LLE 5/5, RUE and RLE with flaccid paralysis. Follow up NIHSS was 17.  Collateral: Gerrie Nordmann [phone]); patient sister, for more formation.  Yesterday, patient was watching television boyfriend.  She states that her sister slumped over in her chair and was unable to answer questions.  She reports that the boyfriend was unable to understand what she was saying.  Patient then started vomiting and EMS was called.  Patient's sister does not know her complete medical history, but states she has a history of hypertension, hyperlipidemia and thyroid dysfunction; she was unsure patient has hypothyroidism or hyperthyroidism.  She believes that her sister is a patient at [facility] in [place]. Called [facility]; she last say them in Oct 2019. They were unable to given current information about pmh and home medications.   Past Medical History  PMH of hypertension,  hyperlipidemia and thyroid dysfunction (unclear if hypothyroid or hypothyroid)   Imaging:  Carotid dopplers 6/3> 1-39% stenosis   6/2 CT H> L thalamic ICH with ventricular extension  6/3 CT H> Interval placement of R frontal EVD  6/2 ECHO> LVEH 50-55% mild LVH. Grade II diastolic dysfunction.   Interim history/subjective:  On 10 cleviprex More tired this morning Objective   Blood pressure 140/75, pulse 81, temperature 98.1 F (36.7 C), temperature source Oral, resp. rate 14, weight 80.9 kg, SpO2 93 %.        Intake/Output Summary (Last 24 hours) at 05/17/2021 0803 Last data filed at 05/17/2021 0700 Gross per 24 hour  Intake 1888.09 ml  Output 1959 ml  Net -70.91 ml   Filed Weights   05/13/21 2256 05/17/21 0400  Weight: 75.3 kg 80.9 kg    Examination:   General: WDWN ill appearing middle aged F asleep in bed   HEENT: R frontal EVD. Pink mmm anicteric sclear  Neuro: Awkens to voice, L gaze preference. LUE stronger than RUE.  CV: rr s1s2 cap refill brisk  Pulm: CTAb even unlabored no accessory use  GI: soft round ndnt  GU: defer  MSK: no acute deformity, no cyanosis no clubbing  Skin: c/d/w   Ancillary tests (personally reviewed)  CBC: Recent Labs  Lab 05/14/21 0119 05/16/21 0605 05/17/21 0318  WBC 12.8* 12.5* 12.9*  HGB 13.6 15.3* 15.0  HCT 42.9 44.5 44.8  MCV 99.8 94.9 95.5  PLT 192 186 201    Basic Metabolic Panel: Recent Labs  Lab 05/14/21 0119 05/15/21 1340 05/15/21 1735 05/16/21 0605 05/16/21 1659 05/17/21 0318  NA 141 136  --  136  --  139  K 3.5 3.7  --  3.8  --  3.7  CL 105 104  --  101  --  101  CO2 27 22  --  24  --  25  GLUCOSE 117* 93  --  163*  --  119*  BUN 10 10  --  12  --  21*  CREATININE 0.83 0.64  --  0.74  --  0.92  CALCIUM 8.8* 8.2*  --  8.3*  --  8.6*  MG  --  2.1 2.3 2.5* 2.4  --   PHOS  --  3.8 3.4 3.6 4.3  --    GFR: CrCl cannot be calculated (Unknown ideal weight.). Recent Labs  Lab 05/14/21 0119 05/16/21 0605  05/17/21 0318  WBC 12.8* 12.5* 12.9*    Liver Function Tests: Recent Labs  Lab 05/14/21 0119  AST 33  ALT 23  ALKPHOS 84  BILITOT 0.7  PROT 7.6  ALBUMIN 4.1   No results for input(s): LIPASE, AMYLASE in the last 168 hours. No results for input(s): AMMONIA in the last 168 hours.  ABG No results found for: PHART, PCO2ART, PO2ART, HCO3, TCO2, ACIDBASEDEF, O2SAT   Coagulation Profile: Recent Labs  Lab 05/14/21 0119  INR 1.0    Cardiac Enzymes: No results for input(s): CKTOTAL, CKMB, CKMBINDEX, TROPONINI in the last 168 hours.  HbA1C: Hgb A1c MFr Bld  Date/Time Value Ref Range Status  05/14/2021 12:23 AM 5.7 (H) 4.8 - 5.6 % Final    Comment:    (NOTE)         Prediabetes: 5.7 - 6.4         Diabetes: >6.4         Glycemic control for adults with diabetes: <7.0     CBG: Recent Labs  Lab 05/16/21 1312 05/16/21 1618 05/16/21 2002 05/16/21 2333 05/17/21 0341  GLUCAP 140* 145* 135* 131* 107*   Resolved problems:  hypertensive emergency   Assessment & Plan:    L thalamic ICH with IVH and hydrocephalus, s/p R frontal EVD  -in setting of hypertensive emergency  P -EVD per NSGY -stroke management per neuro  -keppra for sz ppx  - cleviprex sor SBP <140  -SLP PT OT  -protecting airway well   HTN P -lisinopril, coreg, hctz per tube -cleviprex as above   Dysphagia P -cont EN via cortak -continued SLP  HLD -consider statin at discharge   Daily Goals Checklist  Diet: EN Pain/Anxiety/Delirium protocol (if indicated): no VAP protocol (if indicated): No Respiratory support goals: N/A Blood pressure target: < 140 DVT prophylaxis: SCD  GI prophylaxis: PPI Urinary catheter: n/a Central lines: N/A Mobility:PT/OT Glucose control: Monitor Code Status: Full Code Family Communication: pending 6/5 Disposition: ICU  Critical care time:     CRITICAL CARE Performed by: Lanier Clam   Total critical care time: 32 minutes  Critical care  time was exclusive of separately billable procedures and treating other patients.  Critical care was necessary to treat or prevent imminent or life-threatening deterioration.  Critical care was time spent personally by me on the following activities: development of treatment plan with patient and/or surrogate as well as nursing, discussions with consultants, evaluation of patient's response to treatment, examination of patient, obtaining history from patient or surrogate, ordering and performing treatments and interventions, ordering and review of laboratory studies, ordering and review of radiographic studies, pulse oximetry and re-evaluation of patient's condition.   Tessie Fass MSN, AGACNP-BC [facility] Pulmonary/Critical Care Medicine Amion for pager  05/17/2021, 10:12 AM

## 2021-05-17 NOTE — Progress Notes (Signed)
Inpatient Rehab Admissions Coordinator:  Attempted to see pt to explain CIR.  Pt was asleep and not arousable.  Will f/u at later date.   Wolfgang Phoenix, MS, CCC-SLP Admissions Coordinator [phone]

## 2021-05-17 NOTE — Progress Notes (Signed)
Inpatient Rehab Admissions Coordinator Note:   Per PT/OT recommendations, pt was screened for CIR candidacy by Wolfgang Phoenix, MS, CCC-SLP.  At this time we are recommending an inpatient rehab consult. AC will place consult order per protocol.  Please contact me with questions.    Wolfgang Phoenix, MS, CCC-SLP Admissions Coordinator [phone] 05/17/21 11:25 AM

## 2021-05-18 ENCOUNTER — Inpatient Hospital Stay (HOSPITAL_COMMUNITY): Payer: BLUE CROSS/BLUE SHIELD

## 2021-05-18 DIAGNOSIS — J9601 Acute respiratory failure with hypoxia: Secondary | ICD-10-CM

## 2021-05-18 DIAGNOSIS — I619 Nontraumatic intracerebral hemorrhage, unspecified: Secondary | ICD-10-CM

## 2021-05-18 LAB — CBC
HCT: 43.7 % (ref 36.0–46.0)
Hemoglobin: 14.1 g/dL (ref 12.0–15.0)
MCH: 31.9 pg (ref 26.0–34.0)
MCHC: 32.3 g/dL (ref 30.0–36.0)
MCV: 98.9 fL (ref 80.0–100.0)
Platelets: 184 10*3/uL (ref 150–400)
RBC: 4.42 MIL/uL (ref 3.87–5.11)
RDW: 13.2 % (ref 11.5–15.5)
WBC: 10.9 10*3/uL — ABNORMAL HIGH (ref 4.0–10.5)
nRBC: 0 % (ref 0.0–0.2)

## 2021-05-18 LAB — BASIC METABOLIC PANEL
Anion gap: 9 (ref 5–15)
BUN: 25 mg/dL — ABNORMAL HIGH (ref 6–20)
CO2: 29 mmol/L (ref 22–32)
Calcium: 8.4 mg/dL — ABNORMAL LOW (ref 8.9–10.3)
Chloride: 100 mmol/L (ref 98–111)
Creatinine, Ser: 0.82 mg/dL (ref 0.44–1.00)
GFR, Estimated: >60 mL/min (ref >60)
Glucose, Bld: 105 mg/dL — ABNORMAL HIGH (ref 70–99)
Potassium: 3.7 mmol/L (ref 3.5–5.1)
Sodium: 138 mmol/L (ref 135–145)

## 2021-05-18 LAB — GLUCOSE, CAPILLARY
Glucose-Capillary: 106 mg/dL — ABNORMAL HIGH (ref 70–99)
Glucose-Capillary: 109 mg/dL — ABNORMAL HIGH (ref 70–99)
Glucose-Capillary: 113 mg/dL — ABNORMAL HIGH (ref 70–99)
Glucose-Capillary: 122 mg/dL — ABNORMAL HIGH (ref 70–99)
Glucose-Capillary: 130 mg/dL — ABNORMAL HIGH (ref 70–99)
Glucose-Capillary: 145 mg/dL — ABNORMAL HIGH (ref 70–99)
Glucose-Capillary: 149 mg/dL — ABNORMAL HIGH (ref 70–99)

## 2021-05-18 IMAGING — CT CT HEAD W/O CM
5 of 6 series · 17 of 47 positions shown, 18 images · non-contrast
Comparison: Head CT [DATE] and earlier.

CLINICAL DATA: 59-year-old female with left thalamic hemorrhage,
IVH, EVD.

EXAM:
CT HEAD WITHOUT CONTRAST
TECHNIQUE: Contiguous axial images were obtained from the base of the skull
through the vertex without intravenous contrast.

[Series 3: head wo · axial · 0.45mm/px · z∈[-198,-142]mm · 2 of 35 slices shown, 3 images (1 of 2)]
[im 12/35  brain]
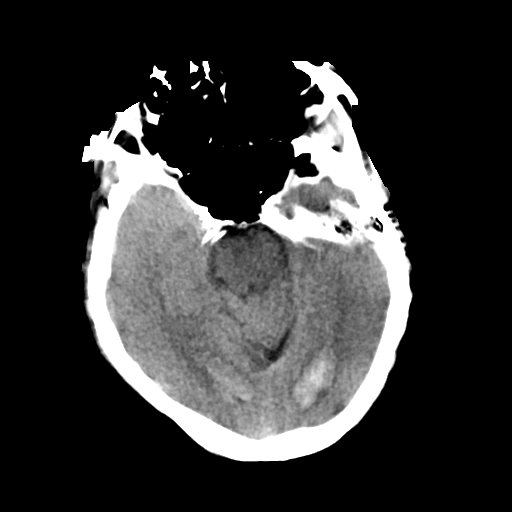
[im 12/35  bone]
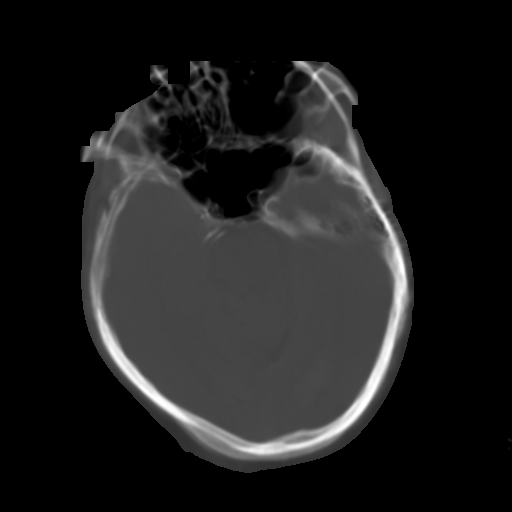
[im 23/35  brain]
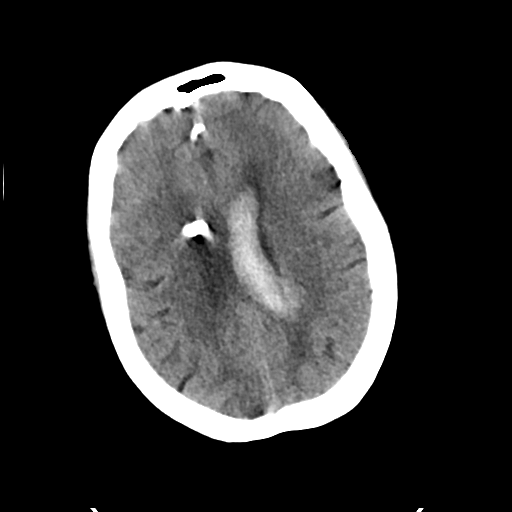

[Series 4: head bone · axial · 0.45mm/px · z∈[-236,-116]mm · 7 of 87 slices shown]
[im 9/87  bone]
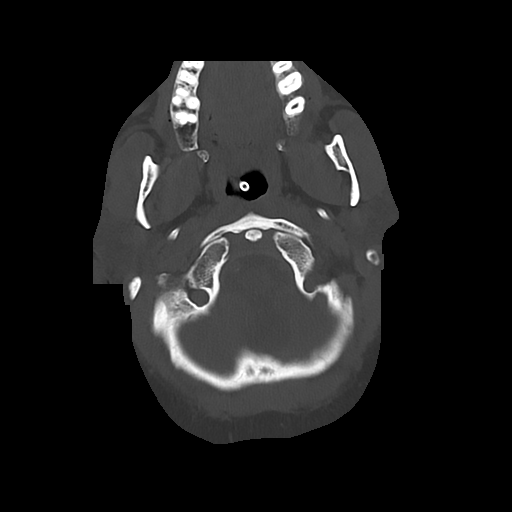
[im 18/87  bone]
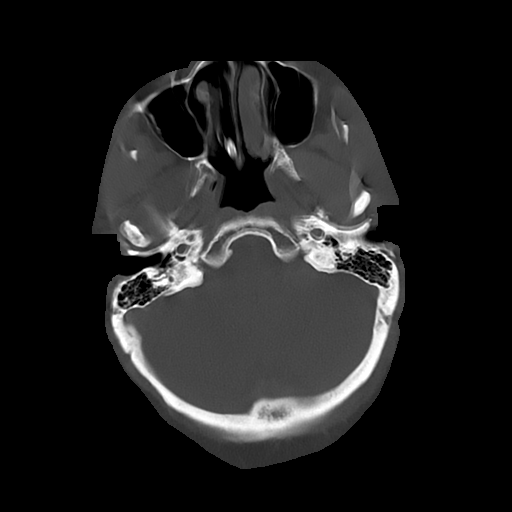
[im 26/87  bone]
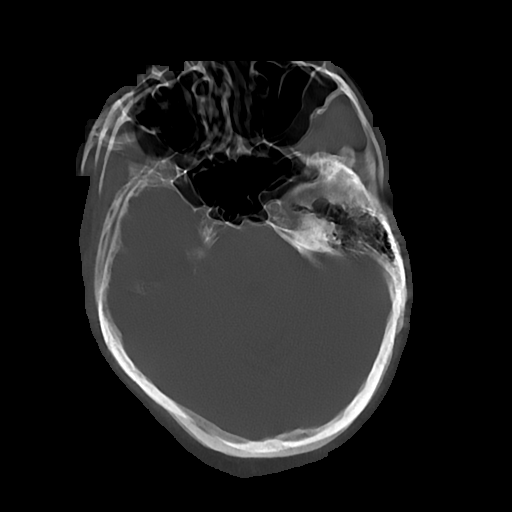
[im 35/87  bone]
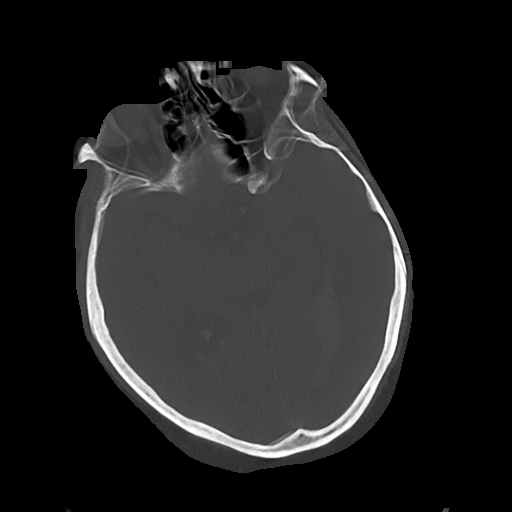
[im 52/87  bone]
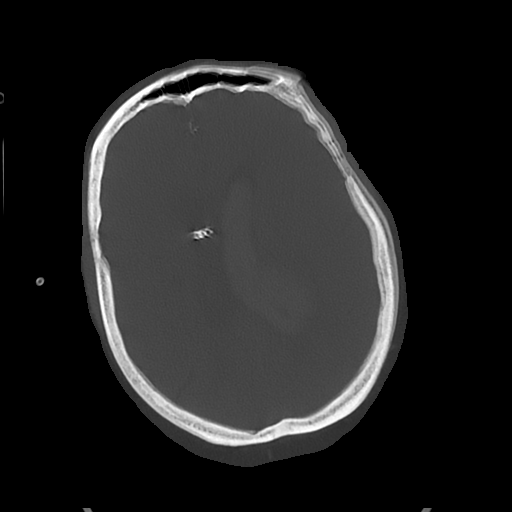
[im 61/87  bone]
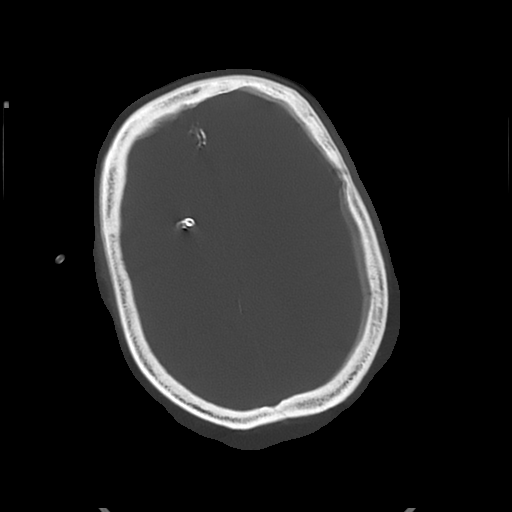
[im 69/87  bone]
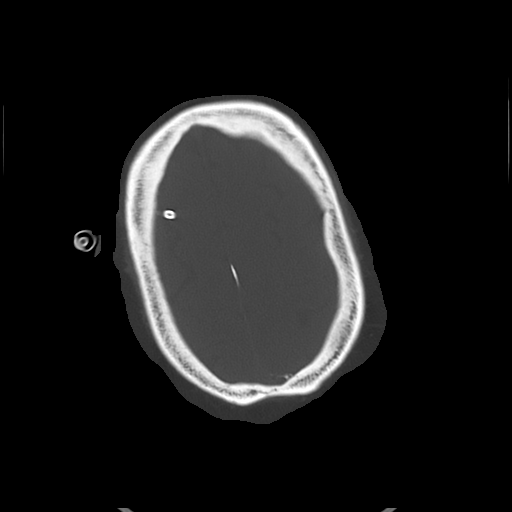

[Series 5: head wo · axial · 0.45mm/px · z∈[-198,-142]mm · 2 of 35 slices shown (2 of 2)]
[im 12/35  brain]
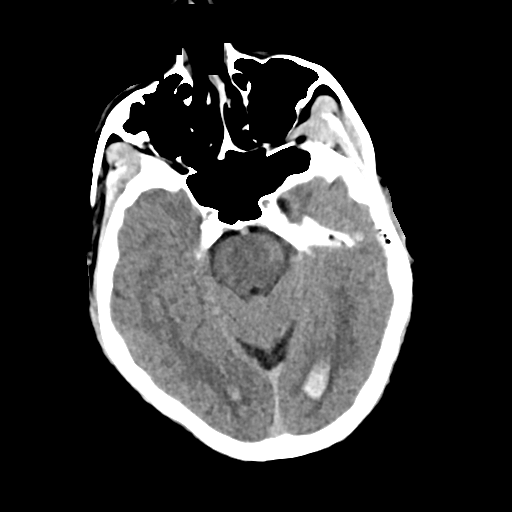
[im 23/35  brain]
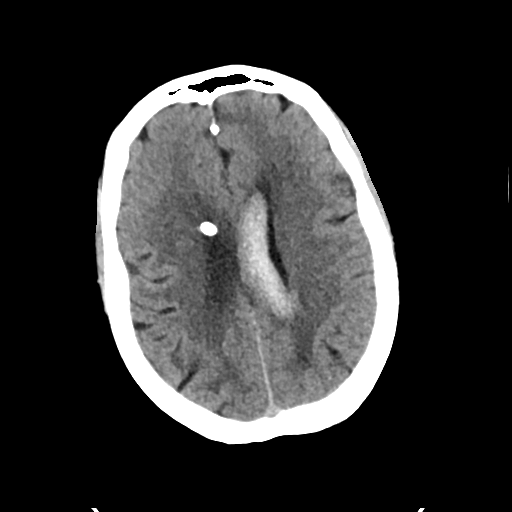

[Series 7: cor soft · coronal · 0.36mm/px · 3 of 73 slices shown]
[im 25/73  brain]
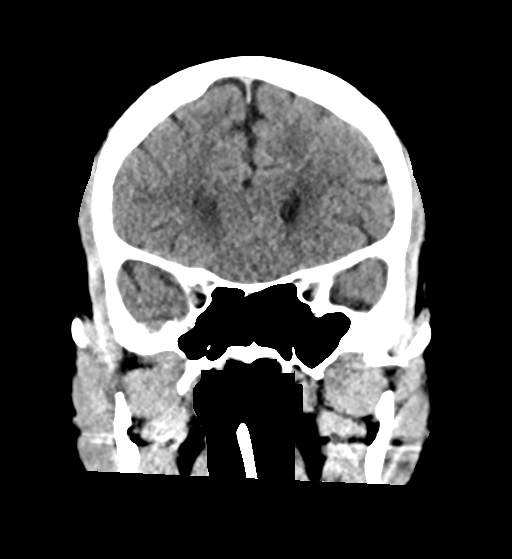
[im 33/73  brain]
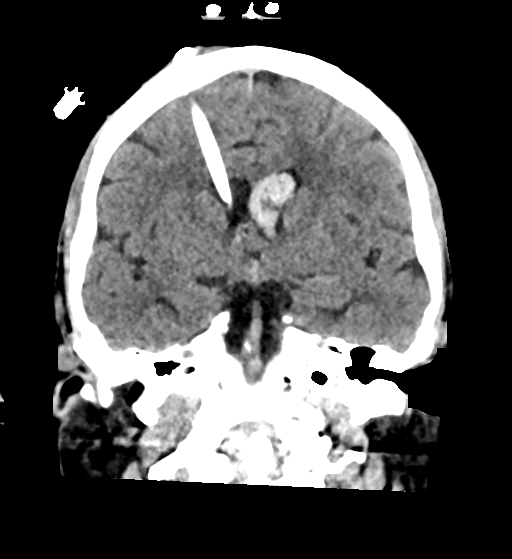
[im 41/73  brain]
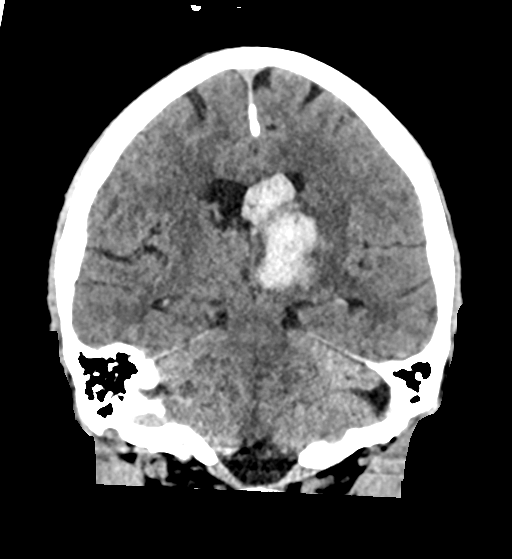

[Series 8: sag soft · sagittal · 0.39mm/px · 3 of 62 slices shown]
[im 21/62  brain]
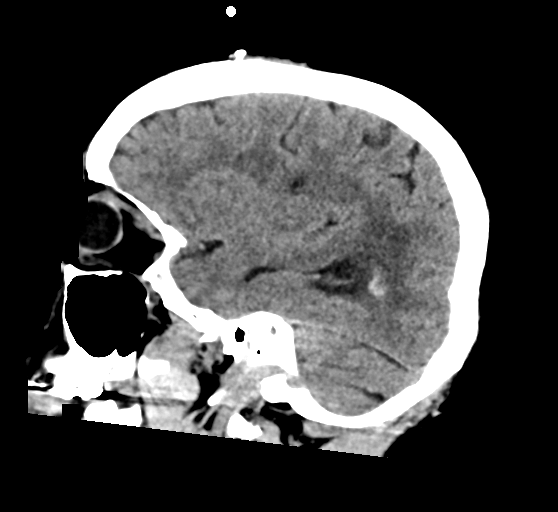
[im 31/62  brain]
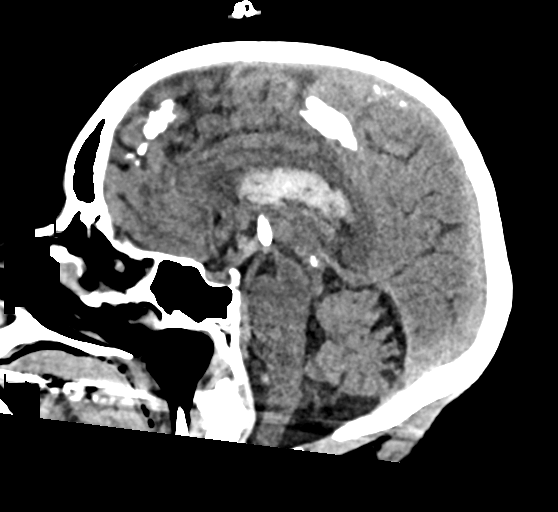
[im 41/62  brain]
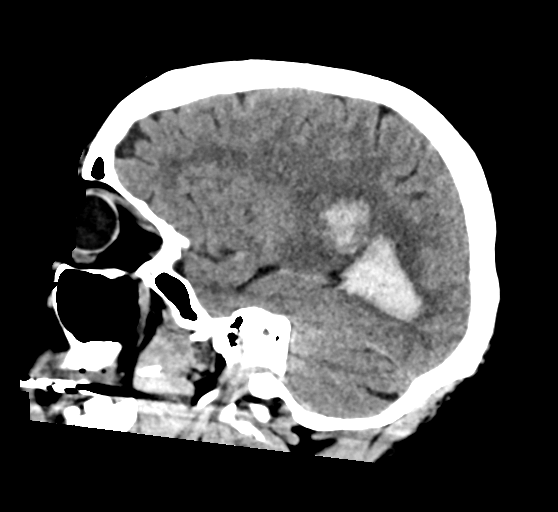

[17 of 47 positions shown; findings below may reference images not displayed]

FINDINGS: Brain: Right superior frontal approach EVD with catheter terminating
at the 3rd ventricle and communicating with the right lateral
ventricle on coronal image 35. The 3rd and 4th ventricles remain
relatively decompressed. There is less 3rd and 4th ventricular IVH.

Lateral ventricle size also mildly decreased since [DATE],
although with moderate residual lateral ventricular blood greater on
the left.

Oval left thalamic intra-axial hemorrhage encompasses 34 x 22 by 33
mm now (AP by transverse by CC) for an estimated blood volume of 12
mL, and has not changed since [DATE]. Regional edema and mild
regional mass effect are stable.

Stable gray-white matter differentiation elsewhere with Patchy and
confluent white matter hypodensity. No new cortically based infarct
identified. Basilar cisterns are patent. No significant midline
shift.

Vascular: Calcified atherosclerosis at the skull base.

Skull: Stable right superior burr hole.

Sinuses/Orbits: Visualized paranasal sinuses and mastoids are stable
and well aerated.

Other: Right nasoenteric tube in place. Mild postoperative changes
to the scalp vertex. Disconjugate gaze but otherwise negative orbits
soft tissues.
IMPRESSION: 1. Stable left thalamic intra-axial hemorrhage size since
[DATE]. Stable regional edema and mild regional mass effect.
2. Stable right frontal approach EVD with slightly decreased lateral
ventricle size since [DATE]. Moderate lateral ventricle IVH
persists. There is less blood in the small 3rd and 4th ventricles
now.
3. Underlying chronic small vessel disease. No new intracranial
abnormality.

## 2021-05-18 MED ORDER — HEPARIN SODIUM (PORCINE) 5000 UNIT/ML IJ SOLN
5000.0000 [IU] | Freq: Three times a day (TID) | INTRAMUSCULAR | Status: DC
  Administered 2021-05-18 – 2021-05-30 (×36): 5000 [IU] via SUBCUTANEOUS
  Filled 2021-05-18 (×36): qty 1

## 2021-05-18 NOTE — PMR Pre-admission (Signed)
PMR Admission Coordinator Pre-Admission Assessment  Patient: Marissa Morris is an 60 y.o., female MRN: [id number] DOB: [birth year] Height:   Weight: 75.2 kg              Insurance Information HMO: Yes    PPO:       PCP:      IPA:      80/20:      OTHER:  PRIMARY: BCBS     Policy#: [id number]      Subscriber: pt CM Name: Merlene Pulling      Phone#: [phone]   Fax#: [phone] Pre-Cert#: [id number] for 7 days with update due on 06/04/21     Employer: [employer] Benefits:  Phone #: [phone]     Name: Kristie Cowman. Date: 12/13/20     Deduct: $2400 (met $260)      Out of Pocket Max: [phone] (met $260)      Life Max: N/A  CIR: 65% coverage, 35% copay      SNF: 65% coverage with 100 days per year Outpatient: 65% with 30 days/yr     Co-Pay: 35% Home Health: 65%    with 100 visits per year   Co-Pay: 35% DME: 65%     Co-Pay: 35% Providers: in network  SECONDARY:  none   Financial Counselor:       Phone#:   [employer], [employer]" for patients in Inpatient Rehabilitation Facilities with attached "Privacy Act [place] Records" was provided and verbally reviewed with: N/A  Emergency Contact Information Contact Information     Name Relation Home Work [place], [place] Sister   [phone]   Mal Misty "Clare Gandy" Significant other [phone]  [phone]      Current Medical History  Patient Admitting Diagnosis: Left thalamic ICH with IVH  History of Present Illness:  A 60 year old right-handed female with history of hypertension as well as back surgery.  Per chart review lives alone.  1 level home with ramped entrance.  Reportedly independent prior to admission.  She has a supportive fianc.  Presented 05/14/2021 on transfer from [facility] after presenting with right side weakness nausea vomiting facial droop with dysarthria.  Noted blood pressure 222/128.  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   Neurosurgery consulted Dr. Duffy Rhody and underwent right frontal ventriculostomy for hydrocephalus.  Patient was managed with hydralazine and nicardipine for blood pressure control.  Keppra was initially initiated for seizure prophylaxis and course completed.  Follow-up MRI/MRA showed no substantial change in thalamic hemorrhage with significant intraventricular extension mild hydrocephalus as well as mild edema and mass-effect compared to recent CT.  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  MRA was unremarkable and CT head completed 05/26/2021 showing interval removal of ventricular catheter with significant decrease in intraventricular hemorrhage.  No new hemorrhage or worsening of mass-effect..  She was cleared to begin subcutaneous heparin for DVT prophylaxis 05/18/2021.  Echocardiogram with ejection fraction of 50 to 81% grade 2 diastolic dysfunction.  Admission chemistries unremarkable except glucose 117 WBC 12,800 urinalysis negative urine drug screen negative.  Patient maintained on Cleviprex initially for blood pressure control.  She was initially n.p.o. with alternative means of nutritional support and diet has been advanced to regular.  Bouts of initial agitation restlessness receiving Zyprexa via.  Patient with mild hyponatremia 128 132 since improved to 135 and monitored.  Therapy evaluations completed due to patient's right side weakness and dysarthria.  Recommendations are for inpatient rehab admission.  Complete NIHSS TOTAL: 13 Glasgow Coma Scale Score: 14  Past Medical History  History reviewed. No pertinent past medical history.  Family History  family history is not on file.  Prior Rehab/Hospitalizations:  Has the patient had prior rehab or hospitalizations prior to admission? yes  Has the patient had major surgery during 100 days prior to admission? yes  Current Medications   Current Facility-Administered Medications:     amLODipine (NORVASC) tablet 10 mg, 10 mg, Oral, Daily, Skeet Simmer, RPH, 10 mg at 05/29/21 0935   chlorhexidine (PERIDEX) 0.12 % solution 15 mL, 15 mL, Mouth Rinse, BID, Garvin Fila, MD, 15 mL at 05/29/21 0933   Chlorhexidine Gluconate Cloth 2 % PADS 6 each, 6 each, Topical, Daily, Kerney Elbe, MD, 6 each at 05/24/21 1327   heparin injection 5,000 Units, 5,000 Units, Subcutaneous, Q8H, Metzger-Cihelka, Desiree, NP, 5,000 Units at 05/29/21 0708   hydrALAZINE (APRESOLINE) tablet 20 mg, 20 mg, Oral, Q8H, Skeet Simmer, RPH, 20 mg at 05/29/21 0708   insulin aspart (novoLOG) injection 0-6 Units, 0-6 Units, Subcutaneous, Q4H, Harris, Whitney D, NP, 1 Units at 05/26/21 2010   labetalol (NORMODYNE) tablet 200 mg, 200 mg, Oral, TID, Skeet Simmer, RPH, 200 mg at 05/29/21 0935   lisinopril (ZESTRIL) tablet 40 mg, 40 mg, Oral, Daily, Skeet Simmer, RPH, 40 mg at 05/29/21 0935   magic mouthwash, 1 mL, Oral, TID, Corey Harold, NP, 1 mL at 05/29/21 0933   MEDLINE mouth rinse, 15 mL, Mouth Rinse, q12n4p, Leonie Man, Pramod S, MD, 15 mL at 05/28/21 1721   modafinil (PROVIGIL) tablet 100 mg, 100 mg, Oral, Daily, Skeet Simmer, RPH, 100 mg at 05/29/21 0935   senna-docusate (Senokot-S) tablet 1 tablet, 1 tablet, Oral, BID, Skeet Simmer, [facility], 1 tablet at 05/29/21 0935  Patients Current Diet:  Diet Order             Diet - low sodium heart healthy           Diet regular Room service appropriate? Yes with Assist; Fluid consistency: Thin  Diet effective now                  Tube feed via Cortrak  Precautions / Restrictions Precautions Precautions: Fall Precaution Comments: cortrack Restrictions Weight Bearing Restrictions: No   Has the patient had 2 or more falls or a fall with injury in the past year?No  Prior Activity Level Community (5-7x/wk): drives, out of house daily. Was caring for her Mom until her death 1 1/2 years ago. 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  Self Care: Did the patient need help bathing, dressing, using the toilet or eating?  Independent  Indoor Mobility: Did the patient need assistance with walking from room to room (with or without device)? Independent  Stairs: Did the patient need assistance with internal or external stairs (with or without device)? Independent  Functional Cognition: Did the patient need help planning regular tasks such as shopping or remembering to take medications? Independent  Home Assistive Devices / Equipment Home Assistive Devices/Equipment: None Home Equipment: Bedside commode, Wheelchair - manual (bariatric w/c)  Prior Device Use: Indicate devices/aids used by the patient prior to current illness, exacerbation or injury? None of the above  Current Functional Level Cognition  Arousal/Alertness: Lethargic Overall Cognitive Status: Impaired/Different from baseline Difficult to assess due to: Level of arousal Current Attention Level: Sustained Orientation Level: Oriented to person, Oriented to place Following Commands: Follows one step commands inconsistently, Follows one step commands with increased time Safety/Judgement: Decreased awareness of safety, Decreased awareness of deficits General Comments: pt following 1 step commands 50% of session with delays to respond; requiring increased time for processing. No verbalization this session.Poor awareness and attention to R side of body or environment. Pt leaning her head against her hands when fatigued. Attention: Sustained Sustained Attention: Impaired Sustained Attention Impairment: Functional basic, Verbal basic Memory:  (TBA) Awareness: Impaired Awareness Impairment: Emergent impairment Problem Solving:  (will assess further) Behaviors: Perseveration Safety/Judgment: Impaired     Extremity Assessment (includes Sensation/Coordination)  Upper Extremity Assessment: RUE deficits/detail RUE Deficits / Details: tone present in sitting, spontaneous activation noted. in supine pt with very flaccid appearance to R UE without the tone. pt moving digits when activating L UE RUE Sensation: decreased light touch, decreased proprioception RUE Coordination: decreased fine motor, decreased gross motor LUE Deficits / Details: fair grip, following commands on L side  Lower Extremity Assessment: Defer to PT evaluation RLE Deficits / Details: No active movement noted, adductor tone. Reactive to painful stimulus    ADLs  Overall ADL's : Needs assistance/impaired Eating/Feeding: Minimal assistance, Bed level Eating/Feeding Details (indicate cue type and reason): needs food in L visual field Grooming: Wash/dry face, Sitting Grooming Details (indicate cue type and reason): While sitting in stedy at sink, pt washing her face with step by step simple cues (in quiet environment) and Mod A for sitting balance and support at RUE. Upper Body Bathing: Maximal assistance, Bed level, Sitting Lower Body Bathing: Total assistance Upper Body Dressing : Maximal assistance, Bed level Upper Body Dressing Details (indicate cue type and reason): Max A to don new gown; Max cues for engaging RUE into task. Poor attention to R Lower Body Dressing: Maximal assistance, Sitting/lateral leans Lower Body Dressing Details (indicate cue type and reason): pt needs max cues to visual attend to feet. pt does not recognize a sock as a sock when asked. Pt with better (A) don sock with R LE being held in figure 4 due to L visual field Toilet Transfer: Maximal assistance, +2 for physical assistance (sit<>stand with stedy) Toilet Transfer Details (indicate cue type and reason): Max A +2 for sit<>stand with stedy Toileting- Clothing Manipulation and Hygiene: Total assistance Functional mobility during ADLs: Maximal  assistance, +2 for physical assistance (sit<>stand with stedy) General ADL Comments: Upon arrival, pt soiled with urine in bed. Requring Max A for bathing and donning new gown. Use of stedy to perform sit<>stand and transition to sink for washing her face    Mobility  Overal bed mobility: Needs Assistance Bed Mobility: Rolling, Sidelying to Sit, Sit to Supine Rolling: Mod assist Sidelying to sit: Max assist Supine to sit: +2 for physical assistance, Max assist Sit to supine: Max assist, +2 for physical assistance General bed mobility comments: Max A to bring BLEs over EOB and elevate trunk. Pt pushing back into sidelying to R and requiring cues for maintaining midline. Max A +2 for returning to bed and supine.    Transfers  Overall transfer level: Needs assistance Equipment used: 2 person hand held assist Transfer via Lift Equipment: Stedy Transfers: Sit to/from Stand Sit to Stand: Max assist, +2 physical assistance, +2 safety/equipment, From elevated surface Stand pivot transfers: Max assist, +2 physical assistance, +2 safety/equipment Squat pivot  transfers: Max assist, +2 physical assistance General transfer comment: Max A +2 to power up into standing    Ambulation / Gait / Stairs / Wheelchair Mobility  Ambulation/Gait General Gait Details: unable    Posture / Balance Dynamic Sitting Balance Sitting balance - Comments: Pushing to R. Able to attain midline for short periods with Max cues and lateral leaning to L. Balance Overall balance assessment: Needs assistance Sitting-balance support: Single extremity supported, Feet supported Sitting balance-Leahy Scale: Poor Sitting balance - Comments: Pushing to R. Able to attain midline for short periods with Max cues and lateral leaning to L. Postural control: Right lateral lean Standing balance support: Single extremity supported, During functional activity Standing balance-Leahy Scale: Poor Standing balance comment: Physical A for  maintaining sitting balance    Special needs/care consideration 43 inches 10 Fr right nare Cortrak placed 6/3   Previous Home Environment  Living Arrangements: Alone Available Help at Discharge: Family, Friend(s) Type of Home: House Home Layout: One level Home Access: Ramped entrance Bathroom Shower/Tub: Chiropodist: Standard Bathroom Accessibility: Yes How Accessible: Accessible via walker [place]: No  Discharge Living Setting Plans for Discharge Living Setting: Lives with (comment) (to stay with her sister at discharge, [name]) Type of Home at Discharge: House Discharge Home Layout: One level Discharge Home Access: Stairs to enter Entrance Stairs-Rails: None Entrance Stairs-Number of Steps: 3 Discharge Bathroom Shower/Tub: Tub/shower unit Discharge Bathroom Toilet: Standard Discharge Bathroom Accessibility: Yes How Accessible: Accessible via walker Does the patient have any problems obtaining your medications?: No  Social/Family/Support Systems Contact Information: [name] is main contact Anticipated Caregiver: [name], when she works her daughter in Sports coach, [name] Anticipated Caregiver's Contact Information: [phone] Ability/Limitations of Caregiver: she works, but DIL will be caregiver 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 with PT, OT and SLP Expected length of stay: ELOS 22 to 25 days Pt/Family Agrees to Admission and willing to participate: Yes Program Orientation Provided & Reviewed with Pt/Caregiver Including Roles  & Responsibilities: Yes  Decrease burden of Care through IP rehab admission: NA  Possible need for SNF placement upon discharge:Not anticipated  Patient Condition: This  patient's medical and functional status has changed since the consult dated: 05/18/2021 in which the Rehabilitation Physician determined and documented that the patient's condition is appropriate for intensive rehabilitative care in an inpatient rehabilitation facility. See "History of Present Illness" (above) for medical update. Functional changes are: Currently max assist +2 for transfers and needs min to max A +2 for ADLs. Patient's medical and functional status update has been discussed with the Rehabilitation physician and patient remains appropriate for inpatient rehabilitation. Will admit to inpatient rehab tomorrow.  Preadmission Screen Completed By: Danne Baxter RN MSN with updates by Retta Diones, RN, 05/29/2021 11:27 AM ______________________________________________________________________   Discussed status with Dr. Naaman Plummer on 05/29/21 at 0930 and received approval for admission tomorrow, 05/30/21.  Admission Coordinator: Danne Baxter RN MSN with updates by Retta Diones, time 1126/Date 05/29/21

## 2021-05-18 NOTE — Progress Notes (Addendum)
  Speech Language Pathology Treatment: Dysphagia;Cognitive-Linquistic  Patient Details Name: Marissa Morris MRN: [id number] DOB: March 09, 1961 Today's Date: 05/18/2021 Time: 9381-0175 SLP Time Calculation (min) (ACUTE ONLY): 20 min  Assessment / Plan / Recommendation Clinical Impression  Emmer encountered in chair awake, interactive attempting to remove her waist restraint. Increased rate, pt self fed pudding with therapist guiding cup to right hand and spoon to left and independently held cup drinking via cup/straw. Delayed mild cough noted after water not present with puree. Mild leakage with thin. Language was fluent with neologisms and paraphasias without self awareness of errors resembling a Wernickes type aphasia from this evaluation. She read 3 words with mostly accurate phonetics. Pointed to response from field of 3 given responsive naming question ("where are you") 1 of 2 opportunities accurate.    HPI HPI: Reeshemah Befort is a 60 y.o. female presenting to [facility] in transfer from [facility] for management of acute intracranial hemorrhage. 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 Moderate chronic microvascular ischemic changes. No PMH on file.      SLP Plan  Continue with current plan of care       Recommendations  Diet recommendations: NPO Medication Administration: Via alternative means                General recommendations: Rehab consult Oral Care Recommendations: Oral care QID Follow up Recommendations: Inpatient Rehab SLP Visit Diagnosis: Aphasia (R47.01);Cognitive communication deficit (Z02.585) Plan: Continue with current plan of care       GO                Royce Macadamia 05/18/2021, 3:36 PM

## 2021-05-18 NOTE — Consult Note (Signed)
Physical Medicine and Rehabilitation Consult Reason for Consult: Right side weakness with facial droop/dysarthria Referring Physician: Dr.Al Masry   HPI: Marissa Morris is a 60 y.o. right-handed female with history of hypertension, back surgery.  Per chart review patient lives alone.  1 level home ramped entrance.  Reportedly independent prior to admission.  [facility]14/2021 [facility] after presenting with right side weakness nausea vomiting facial droop with dysarthria.  Noted blood pressure 222/128.  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  Neurosurgery consulted underwent right frontal ventriculostomy for hydrocephalus.  Patient was managed with hydralazine and nicardipine for blood pressure control.  Keppra was initially initiated for seizure prophylaxis.  MRI/MRA showed no substantial change in left thalamic hemorrhage with significant intraventricular extension mild hydrocephalus as well as mild edema and mass-effect compared to recent CT.  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  MRA was unremarkable.  Echocardiogram with ejection fraction of 50 to 55% grade 2 diastolic dysfunction.  Admission chemistries unremarkable except glucose 117, WBC 12,800, urinalysis negative, urine drug screen negative.  Patient currently remains on Cleviprex for blood pressure control.  N.p.o. with alternative means of nutritional support.  Patient with bouts of agitation and restlessness received Zyprexa via.  Therapy evaluations completed due to patient's right side weakness and dysarthria recommendations of physical medicine rehab consult. Fianc at bedside, he walks with a cane   Review of Systems  Constitutional: Negative for chills and fever.  HENT: Negative for hearing loss.   Eyes: Negative for blurred vision and double vision.  Respiratory: Negative for cough and shortness  of breath.   Cardiovascular: Negative for chest pain, palpitations and leg swelling.  Gastrointestinal: Positive for constipation. Negative for heartburn, nausea and vomiting.  Genitourinary: Negative for dysuria and hematuria.  Musculoskeletal: Positive for back pain and myalgias.  Skin: Negative for rash.  Neurological: Positive for speech change, weakness and headaches.  All other systems reviewed and are negative.  History reviewed. No pertinent past medical history. History reviewed. No pertinent surgical history. No family history on file. Social History:  has no history on file for tobacco use, alcohol use, and drug use. Allergies:  Allergies  Allergen Reactions  . Other Other (See Comments) and Cough    Pollen- Sneezing, also   Medications Prior to Admission  Medication Sig Dispense Refill  . BAYER LOW DOSE 81 MG EC tablet Take 81 mg by mouth daily. Swallow whole.    . Multiple Vitamins-Minerals (ONE-A-DAY WOMENS 50+) TABS Take 1 tablet by mouth daily.    . vitamin C (ASCORBIC ACID) 500 MG tablet Take 500-1,000 mg by mouth daily.      Home: Home Living Family/patient expects to be discharged to:: Private residence Living Arrangements: Alone Type of Home: House Home Access: Ramped entrance Home Layout: One level Bathroom Shower/Tub: Engineer, manufacturing systemsTub/shower unit Bathroom Toilet: Standard Home Equipment: Bedside commode,Wheelchair - manual (bariatric w/c)  Functional History: Prior Function Level of Independence: Independent Comments: not working; assisting fiance as needed s/p his back surgeries. Functional Status:  Mobility: Bed Mobility Overal bed mobility: Needs Assistance Bed Mobility: Supine to Sit,Sit to Supine,Rolling Rolling: Min assist Supine to sit: Mod assist,+2 for physical assistance Sit to supine: Mod assist,+2 for physical assistance General bed mobility comments: MinA to roll towards right, pt initiating well, modA + 2 for management of RLE off edge of bed and  for trunk control Transfers  Overall transfer level: Needs assistance Equipment used: None Transfers: Sit to/from Stand Sit to Stand: Max assist,+2 physical assistance General transfer comment: MaxA + 2 to rise to stand from edge of bed x 2 with right knee block,      ADL: ADL Overall ADL's : Needs assistance/impaired Eating/Feeding: NPO Eating/Feeding Details (indicate cue type and reason): cortrak Grooming: Total assistance Upper Body Bathing: Total assistance Lower Body Bathing: Total assistance Upper Body Dressing : Total assistance Lower Body Dressing: Total assistance Toilet Transfer: Total assistance Toileting- Clothing Manipulation and Hygiene: Total assistance Functional mobility during ADLs: Moderate assistance,Total assistance,+2 for physical assistance,Caregiver able to provide necessary level of assistance General ADL Comments: Pt totalA for ADL at this time due to R side flaccid and 25% of command following. Pt turning head to left to see, but will not go beyond midline- requires tactile cues to turn head to R, btu vision stays looking L.  Cognition: Cognition Overall Cognitive Status: Difficult to assess Arousal/Alertness: Lethargic Orientation Level: Oriented to person Attention: Sustained Sustained Attention: Impaired Sustained Attention Impairment: Functional basic,Verbal basic Memory:  (TBA) Awareness: Impaired Awareness Impairment: Emergent impairment Problem Solving:  (will assess further) Behaviors: Perseveration Safety/Judgment: Impaired Cognition Arousal/Alertness: Lethargic Behavior During Therapy: Flat affect Overall Cognitive Status: Difficult to assess General Comments: Pt lethargic, able to arouse intermittently and open eyes briefly for 2-3 seconds, following 25% of 1 step commands. Pt with decreased arousal compared to SLP eval. Family in room attempting to arouse pt to their voice, but pt unable to keep eyes open long enough. Difficult to  assess due to: Level of arousal  Blood pressure 132/86, pulse 71, temperature 98.2 F (36.8 C), temperature source Axillary, resp. rate 14, weight 77.7 kg, SpO2 (!) 89 %. Physical Exam Constitutional:      Appearance: She is ill-appearing.  HENT:     Head:     Comments: Right frontal ventriculostomy    Nose: Nose normal.     Comments: Core track feeding tube  Eyes:     Extraocular Movements: Extraocular movements intact.     Pupils: Pupils are equal, round, and reactive to light.  Cardiovascular:     Rate and Rhythm: Regular rhythm. Tachycardia present.     Heart sounds: Normal heart sounds. No murmur heard.   Pulmonary:     Effort: Pulmonary effort is normal. No respiratory distress.     Breath sounds: Normal breath sounds.  Abdominal:     General: Abdomen is flat. Bowel sounds are normal.     Palpations: Abdomen is soft.     Tenderness: There is no abdominal tenderness.  Musculoskeletal:     Right lower leg: No edema.     Left lower leg: No edema.     Comments: No pain with upper limb or lower limb range of motion  Skin:    General: Skin is warm and dry.  Neurological:     Mental Status: She is lethargic.     Cranial Nerves: Dysarthria present.     Coordination: Coordination abnormal.     Gait: Gait abnormal.     Comments: Patient is a bit lethargic but arousable.  Follows simple commands.  Left gaze preference.  Able to provide some simple word responses with perseveration.  Motor strength is trace right grip otherwise 0/5 right upper extremity trace hip knee extensor synergy on the right otherwise 0/5 Left side has at least 4/5 strength in the upper and lower limb, difficult to perform formal manual muscle testing secondary  to lethargy Withdraws to pinch in all 4 limbs     Results for orders placed or performed during the hospital encounter of 05/13/21 (from the past 24 hour(s))  Glucose, capillary     Status: Abnormal   Collection Time: 05/17/21  8:31 AM  Result  Value Ref Range   Glucose-Capillary 120 (H) 70 - 99 mg/dL   Comment 1 Notify RN    Comment 2 Document in Chart   Glucose, capillary     Status: Abnormal   Collection Time: 05/17/21 11:01 AM  Result Value Ref Range   Glucose-Capillary 109 (H) 70 - 99 mg/dL   Comment 1 Notify RN    Comment 2 Document in Chart   Urinalysis, Routine w reflex microscopic Urine, Catheterized     Status: None   Collection Time: 05/17/21 11:23 AM  Result Value Ref Range   Color, Urine YELLOW YELLOW   APPearance CLEAR CLEAR   Specific Gravity, Urine 1.021 1.005 - 1.030   pH 5.0 5.0 - 8.0   Glucose, UA NEGATIVE NEGATIVE mg/dL   Hgb urine dipstick NEGATIVE NEGATIVE   Bilirubin Urine NEGATIVE NEGATIVE   Ketones, ur NEGATIVE NEGATIVE mg/dL   Protein, ur NEGATIVE NEGATIVE mg/dL   Nitrite NEGATIVE NEGATIVE   Leukocytes,Ua NEGATIVE NEGATIVE  Glucose, capillary     Status: Abnormal   Collection Time: 05/17/21  4:09 PM  Result Value Ref Range   Glucose-Capillary 127 (H) 70 - 99 mg/dL   Comment 1 Notify RN    Comment 2 Document in Chart   Glucose, capillary     Status: Abnormal   Collection Time: 05/17/21  7:51 PM  Result Value Ref Range   Glucose-Capillary 117 (H) 70 - 99 mg/dL  Glucose, capillary     Status: Abnormal   Collection Time: 05/18/21 12:00 AM  Result Value Ref Range   Glucose-Capillary 149 (H) 70 - 99 mg/dL  CBC     Status: Abnormal   Collection Time: 05/18/21  4:44 AM  Result Value Ref Range   WBC 10.9 (H) 4.0 - 10.5 K/uL   RBC 4.42 3.87 - 5.11 MIL/uL   Hemoglobin 14.1 12.0 - 15.0 g/dL   HCT 45.8 09.9 - 83.3 %   MCV 98.9 80.0 - 100.0 fL   MCH 31.9 26.0 - 34.0 pg   MCHC 32.3 30.0 - 36.0 g/dL   RDW 82.5 05.3 - 97.6 %   Platelets 184 150 - 400 K/uL   nRBC 0.0 0.0 - 0.2 %  Basic metabolic panel     Status: Abnormal   Collection Time: 05/18/21  4:44 AM  Result Value Ref Range   Sodium 138 135 - 145 mmol/L   Potassium 3.7 3.5 - 5.1 mmol/L   Chloride 100 98 - 111 mmol/L   CO2 29 22 - 32  mmol/L   Glucose, Bld 105 (H) 70 - 99 mg/dL   BUN 25 (H) 6 - 20 mg/dL   Creatinine, Ser 7.34 0.44 - 1.00 mg/dL   Calcium 8.4 (L) 8.9 - 10.3 mg/dL   GFR, Estimated >19 >37 mL/min   Anion gap 9 5 - 15   DG CHEST PORT 1 VIEW  Result Date: 05/17/2021 CLINICAL DATA:  Respiratory failure EXAM: PORTABLE CHEST 1 VIEW COMPARISON:  May 14, 2021 FINDINGS: Feeding tube tip is below the diaphragm. No pneumothorax. There is no edema or airspace opacity. There is cardiomegaly with pulmonary vascularity normal. No adenopathy. No bone lesions. IMPRESSION: No edema or airspace opacity. Cardiomegaly, stable. Feeding  tube tip below diaphragm. Electronically Signed   By: Bretta Bang III M.D.   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 Does the need for close, 24 hr/day medical supervision in concert with the patient's rehab needs make it unreasonable for this patient to be served in a less intensive setting? Yes 2. Co-Morbidities requiring supervision/potential complications: Dysphagia, aphasia, right hemiparesis, uncontrolled hypertension 3. Due to bladder management, bowel management, safety, skin/wound care, disease management, medication administration, pain management and patient education, does the patient require 24 hr/day rehab nursing? Yes 4. Does the patient require coordinated care of a physician, rehab nurse, therapy disciplines of PT, OT, speech to address physical and functional deficits in the context of the above medical diagnosis(es)? Yes Addressing deficits in the following areas: balance, endurance, locomotion, strength, transferring, bowel/bladder control, bathing, dressing, feeding, grooming, toileting, cognition, speech, language, swallowing and psychosocial support 5. Can the patient actively participate in an intensive therapy program of at least 3 hrs of therapy per day at least 5 days per week? Potentially 6. The potential for  patient to make measurable gains while on inpatient rehab is fair 7. Anticipated functional outcomes upon discharge from inpatient rehab are min assist and mod assist  with PT, min assist and mod assist with OT, min assist and mod assist with SLP. 8. Estimated rehab length of stay to reach the above functional goals is: 22 to 25 days 9. Anticipated discharge destination: Home 10. Overall Rehab/Functional Prognosis: fair  RECOMMENDATIONS: This patient's condition is appropriate for continued rehabilitative care in the following setting: CIR Patient has agreed to participate in recommended program. N/A Note that insurance prior authorization may be required for reimbursement for recommended care.  Comment: Patient not ready until ventriculostomy drain is out and blood pressure is controlled off IV medications, social work will need to talk to fianc in terms of his ability to care for patient   Charlton Amor, PA-C 05/18/2021   "I have personally performed a face to face diagnostic evaluation of this patient.  Additionally, I have reviewed and concur with the physician assistant's documentation above." Erick Colace M.D. [facility] Fellow Am Acad of Phys Med and Rehab Diplomate Am Board of Electrodiagnostic Med Fellow Am Board of Interventional Pain

## 2021-05-18 NOTE — Progress Notes (Addendum)
0000 Upon assessment patient noted to be more drowsy and requiring more stimulation to arouse than prior two nights But once awake able to respond to voice and  Follows commands with purposeful movement on left extremities. EVD still putting out hourly at 10 cm H20. Spoke with Dr. Danielle Dess of Neurosurgery about patients current status. New order for CT of head this morning.

## 2021-05-18 NOTE — Progress Notes (Signed)
NAMEKambry Morris, MRN:  [id number], DOB:  1961-10-07, LOS: 4 ADMISSION DATE:  05/13/2021, CONSULTATION DATE:  05/14/2021 REFERRING MD:  Stroke team CHIEF COMPLAINT: Intracranial Hemorrhage  HPI/course in hospital  60 year old female with PMHx significant for HTN, HLD and thyroid disease [facility] for management of acute ICH.    Per notes/records from [facility], EMS was called to the patient's home and on arrival the patient was found to be slumped over in a chair. Sheprojectilevomited and thenwas noted to beflaccid on the right with right facial droop, was very somnolent and unable to answer questions coherently. On arrival to the ED, BP was 222/128. CT Head revealed 3cm L thalamic hemorrhage with intraventricular extension.  Initial NIHSS was 25. Initial exam by EDP at OSH: Patient awake with slow but comprehensible speech. Oriented to person. Confused. CN were normal. LUE and LLE 5/5, RUE and RLE with flaccid paralysis. Follow up NIHSS was 17.   Past Medical History  HTN, HLD and thyroid dysfunction (unclear if hypothyroid or hyperthyroid)  Imaging:  Carotid dopplers 6/3> 1-39% stenosis   6/2 CT H> L thalamic ICH with ventricular extension  6/3 CT H> Interval placement of R frontal EVD  6/2 ECHO> LVEH 50-55% mild LVH. Grade II diastolic dysfunction.   Interim History/Subjective:  Drowsy this morning, keeping eyes closed but nodding appropriately to questions Denies pain at present, just feels tired Off of Cleviprex at present Repeat CT Head overnight in the setting of increased somnolence, grossly stable  Objective   Blood pressure (!) 159/80, pulse 76, temperature (!) 101.9 F (38.8 C), temperature source Axillary, resp. rate 15, weight 77.7 kg, SpO2 94 %.        Intake/Output Summary (Last 24 hours) at 05/18/2021 0858 Last data filed at 05/18/2021 0800 Gross per 24 hour  Intake 1576.75 ml  Output 1613  ml  Net -36.25 ml   Filed Weights   05/13/21 2256 05/17/21 0400 05/18/21 0500  Weight: 75.3 kg 80.9 kg 77.7 kg   Physical Examination: General: Acutely ill-appearing middle-aged woman in NAD. HEENT: Anicteric sclera, PERRL, slight L gaze preference, moist mucous membranes. EVD in place with thin serosanguineous output. Neuro: Awake, but drowsy. Nodding/mouthing words appropriately to questions. Responds to verbal stimuli. Following commands consistently in LUE/LLE. Unilateral R-sided neglect noted. Strength 5/5 LUE/LLE, decreased in RUE/RLE. CV: RRR, no m/g/r. PULM: Breathing even and unlabored on 2L [place]. Lung fields CTAB. GI: Soft, nontender, nondistended. Normoactive bowel sounds. Extremities: No LE edema noted. Skin: Warm/dry, no rashes.  Resolved problems:  Hypertensive emergency   Assessment & Plan:   L thalamic ICH with IVH and hydrocephalus, s/p R frontal EVD  Believed to be in the setting of hypertensive emergency (SBP 220 on arrival). S/p EVD with NSGY. CT Head 6/6 with stable L thalamic intra-axial hemorrhage, stable regional edema/mild regional mass effect, stable R frontal approach EVD with slightly decreased lateral ventricle size since 6/3. - Stroke and NSGY teams following, appreciate assistance - EVD management per NSGY - Continue Keppra for seizure ppx - Cleviprex for goal SBP < 140 - Neuroprotective measures: HOB > 30 degrees, normoglycemia, normothermia, electrolytes WNL - CIR consult pending  HTN Hypertensive emergency with resultant ICH. Longstanding history of HTN. - Continue Lisinopril, Coreg, HCTZ VT - Wean Cleviprex for SBP < 140  Dysphagia - Continue TFs via Cortrak - Ongoing SLP follow-up, appreciate assistance - CIR consult/evaluation pending  HLD - Consider statin at d/c  Best  Practice (evaluated daily):  Diet:  Tube Feed  Pain/Anxiety/Delirium protocol (if indicated): Yes (RASS goal 0) VAP protocol (if indicated): Not indicated DVT  prophylaxis: Contraindicated  - ICH GI prophylaxis: PPI Glucose control:  SSI No Central venous access:  N/A Arterial line:  N/A Foley:  N/A Mobility:  OOB  PT consulted: Yes - CIR consult pending Last date of multidisciplinary goals of care discussion [6/5] Code Status:  full code Disposition: ICU - possible dispo to CIR, consult pending  Critical care time: 34 minutes   Tim Lair, PA-C [facility] 05/18/21 9:07 AM  Please see [email or web address] for pager details.  From 7A-7P if no response, please call [phone] After hours, please call ELink [phone]

## 2021-05-18 NOTE — Progress Notes (Signed)
Subjective: NAEs  Objective: Vital signs in last 24 hours: Temp:  [98.2 F (36.8 C)-101.9 F (38.8 C)] 101.7 F (38.7 C) (06/06 1200) Pulse Rate:  [68-85] 77 (06/06 1200) Resp:  [10-21] 15 (06/06 1200) BP: (112-164)/(66-99) 146/91 (06/06 1200) SpO2:  [89 %-100 %] 94 % (06/06 1200) Weight:  [77.7 kg] 77.7 kg (06/06 0500)  Intake/Output from previous day: 06/05 0701 - 06/06 0700 In: 2138.3 [I.V.:658.3; NG/GT:1380; IV Piggyback:100] Out: 1609 [Urine:1450; Drains:159] Intake/Output this shift: Total I/O In: 305.3 [I.V.:5.3; NG/GT:300] Out: 59 [Drains:59]  Eyes open to voice, PERRL FC on L Saying words but some speech is incomprehesible  Lab Results: Recent Labs    05/17/21 0318 05/18/21 0444  WBC 12.9* 10.9*  HGB 15.0 14.1  HCT 44.8 43.7  PLT 201 184   BMET Recent Labs    05/17/21 0318 05/18/21 0444  NA 139 138  K 3.7 3.7  CL 101 100  CO2 25 29  GLUCOSE 119* 105*  BUN 21* 25*  CREATININE 0.92 0.82  CALCIUM 8.6* 8.4*    Studies/Results: CT HEAD WO CONTRAST  Result Date: 05/18/2021 CLINICAL DATA:  60 year old female with left thalamic hemorrhage, IVH, EVD. EXAM: CT HEAD WITHOUT CONTRAST TECHNIQUE: Contiguous axial images were obtained from the base of the skull through the vertex without intravenous contrast. COMPARISON:  Head CT 05/15/2021 and earlier. FINDINGS: Brain: Right superior frontal approach EVD with catheter terminating at the 3rd ventricle and communicating with the right lateral ventricle on coronal image 35. The 3rd and 4th ventricles remain relatively decompressed. There is less 3rd and 4th ventricular IVH. Lateral ventricle size also mildly decreased since 05/15/2021, although with moderate residual lateral ventricular blood greater on the left. Oval left thalamic intra-axial hemorrhage encompasses 34 x 22 by 33 mm now (AP by transverse by CC) for an estimated blood volume of 12 mL, and has not changed since 05/15/2021. Regional edema and mild regional  mass effect are stable. Stable gray-white matter differentiation elsewhere with Patchy and confluent white matter hypodensity. No new cortically based infarct identified. Basilar cisterns are patent. No significant midline shift. Vascular: Calcified atherosclerosis at the skull base. Skull: Stable right superior burr hole. Sinuses/Orbits: Visualized paranasal sinuses and mastoids are stable and well aerated. Other: Right nasoenteric tube in place. Mild postoperative changes to the scalp vertex. Disconjugate gaze but otherwise negative orbits soft tissues. IMPRESSION: 1. Stable left thalamic intra-axial hemorrhage size since 05/15/2021. Stable regional edema and mild regional mass effect. 2. Stable right frontal approach EVD with slightly decreased lateral ventricle size since 05/15/2021. Moderate lateral ventricle IVH persists. There is less blood in the small 3rd and 4th ventricles now. 3. Underlying chronic small vessel disease. No new intracranial abnormality. Electronically Signed   By: Odessa Fleming M.D.   On: 05/18/2021 06:31   DG CHEST PORT 1 VIEW  Result Date: 05/17/2021 CLINICAL DATA:  Respiratory failure EXAM: PORTABLE CHEST 1 VIEW COMPARISON:  May 14, 2021 FINDINGS: Feeding tube tip is below the diaphragm. No pneumothorax. There is no edema or airspace opacity. There is cardiomegaly with pulmonary vascularity normal. No adenopathy. No bone lesions. IMPRESSION: No edema or airspace opacity. Cardiomegaly, stable. Feeding tube tip below diaphragm. Electronically Signed   By: Bretta Bang III M.D.   On: 05/17/2021 14:17    Assessment/Plan: L thalamic ICH with IVH - cont EVD at 10 - will raise EVD to 20 tomorrow   Bedelia Person 05/18/2021, 12:52 PM

## 2021-05-18 NOTE — Progress Notes (Addendum)
Inpatient Rehab Admissions:  Inpatient Rehab Consult received.  I met with patient at the bedside for rehabilitation assessment and to discuss goals and expectations of an inpatient rehab admission.  Boyfriend, Clare Gandy was also present.  Pt was alert today but working with therapy.  Spoke with [employer].  He acknowledged understanding of goals and expectations. He is interested in pt pursuing CIR.  Given permission to contact pt's sisters, Vaughan Basta and/or [place].  Will continue to follow.  Addendum 15:00: Spoke with pt's sister [name]. She acknowledged understanding of CIR goals and expectations. She is interested in pt pursuing CIR.  She said family would discuss d/c location for pt and let AC know. Will continue to follow.  Signed: Gayland Curry, [place], [place] Admissions Coordinator [phone]

## 2021-05-18 NOTE — Progress Notes (Addendum)
STROKE TEAM PROGRESS NOTE   SUBJECTIVE (INTERVAL HISTORY) Lethargic, today but following commands. EVD draining well, anticipate wean soon. Pt neurologically unchanged, stable. Still has left gaze preference and right hemiparesis.  Blood pressure adequately controlled.  Cleviprex drip has been weaned off OBJECTIVE Temp:  [98.2 F (36.8 C)-101.9 F (38.8 C)] 101.7 F (38.7 C) (06/06 1200) Pulse Rate:  [68-85] 77 (06/06 1200) Cardiac Rhythm: Normal sinus rhythm (06/06 0800) Resp:  [10-21] 15 (06/06 1200) BP: (112-164)/(66-99) 146/91 (06/06 1200) SpO2:  [89 %-100 %] 94 % (06/06 1200) Weight:  [77.7 kg] 77.7 kg (06/06 0500)  Recent Labs  Lab 05/17/21 1951 05/18/21 0000 05/18/21 0428 05/18/21 0820 05/18/21 1200  GLUCAP 117* 149* 106* 109* 113*   Recent Labs  Lab 05/14/21 0119 05/15/21 1340 05/15/21 1735 05/16/21 0605 05/16/21 1659 05/17/21 0318 05/18/21 0444  NA 141 136  --  136  --  139 138  K 3.5 3.7  --  3.8  --  3.7 3.7  CL 105 104  --  101  --  101 100  CO2 27 22  --  24  --  25 29  GLUCOSE 117* 93  --  163*  --  119* 105*  BUN 10 10  --  12  --  21* 25*  CREATININE 0.83 0.64  --  0.74  --  0.92 0.82  CALCIUM 8.8* 8.2*  --  8.3*  --  8.6* 8.4*  MG  --  2.1 2.3 2.5* 2.4  --   --   PHOS  --  3.8 3.4 3.6 4.3  --   --    Recent Labs  Lab 05/14/21 0119  AST 33  ALT 23  ALKPHOS 84  BILITOT 0.7  PROT 7.6  ALBUMIN 4.1   Recent Labs  Lab 05/14/21 0119 05/16/21 0605 05/17/21 0318 05/18/21 0444  WBC 12.8* 12.5* 12.9* 10.9*  HGB 13.6 15.3* 15.0 14.1  HCT 42.9 44.5 44.8 43.7  MCV 99.8 94.9 95.5 98.9  PLT 192 186 201 184   No results for input(s): CKTOTAL, CKMB, CKMBINDEX, TROPONINI in the last 168 hours. No results for input(s): LABPROT, INR in the last 72 hours. Recent Labs    05/17/21 1123  COLORURINE YELLOW  LABSPEC 1.021  PHURINE 5.0  GLUCOSEU NEGATIVE  HGBUR NEGATIVE  BILIRUBINUR NEGATIVE  KETONESUR NEGATIVE  PROTEINUR NEGATIVE  NITRITE  NEGATIVE  LEUKOCYTESUR NEGATIVE       Component Value Date/Time   CHOL 203 (H) 05/14/2021 0022   TRIG 71 05/14/2021 0022   HDL 68 05/14/2021 0022   CHOLHDL 3.0 05/14/2021 0022   VLDL 14 05/14/2021 0022   LDLCALC 121 (H) 05/14/2021 0022   Lab Results  Component Value Date   HGBA1C 5.7 (H) 05/14/2021      Component Value Date/Time   LABOPIA NONE DETECTED 05/14/2021 0254   COCAINSCRNUR NONE DETECTED 05/14/2021 0254   LABBENZ NONE DETECTED 05/14/2021 0254   AMPHETMU NONE DETECTED 05/14/2021 0254   THCU NONE DETECTED 05/14/2021 0254   LABBARB NONE DETECTED 05/14/2021 0254    No results for input(s): ETH in the last 168 hours.  I have personally reviewed the radiological images below and agree with the radiology interpretations.  DG Abd 1 View  Result Date: 05/14/2021 CLINICAL DATA:  60 year old female with intracranial hemorrhage. EXAM: ABDOMEN - 1 VIEW COMPARISON:  Portable pelvis radiograph today reported separately. FINDINGS: Portable AP supine view at 0344 hours. Non obstructed bowel gas pattern. No definite pneumoperitoneum on this supine  view. Minimally included left lung base appears grossly negative. Scoliosis and degenerative changes in the spine. Pelvic dystrophic and/or vascular calcifications, mild. No acute osseous abnormality identified. IMPRESSION: 1. Normal bowel gas pattern. 2. Spinal scoliosis and degeneration. Electronically Signed   By: Odessa Fleming M.D.   On: 05/14/2021 06:19   CT HEAD WO CONTRAST  Result Date: 05/18/2021 CLINICAL DATA:  60 year old female with left thalamic hemorrhage, IVH, EVD. EXAM: CT HEAD WITHOUT CONTRAST TECHNIQUE: Contiguous axial images were obtained from the base of the skull through the vertex without intravenous contrast. COMPARISON:  Head CT 05/15/2021 and earlier. FINDINGS: Brain: Right superior frontal approach EVD with catheter terminating at the 3rd ventricle and communicating with the right lateral ventricle on coronal image 35. The 3rd  and 4th ventricles remain relatively decompressed. There is less 3rd and 4th ventricular IVH. Lateral ventricle size also mildly decreased since 05/15/2021, although with moderate residual lateral ventricular blood greater on the left. Oval left thalamic intra-axial hemorrhage encompasses 34 x 22 by 33 mm now (AP by transverse by CC) for an estimated blood volume of 12 mL, and has not changed since 05/15/2021. Regional edema and mild regional mass effect are stable. Stable gray-white matter differentiation elsewhere with Patchy and confluent white matter hypodensity. No new cortically based infarct identified. Basilar cisterns are patent. No significant midline shift. Vascular: Calcified atherosclerosis at the skull base. Skull: Stable right superior burr hole. Sinuses/Orbits: Visualized paranasal sinuses and mastoids are stable and well aerated. Other: Right nasoenteric tube in place. Mild postoperative changes to the scalp vertex. Disconjugate gaze but otherwise negative orbits soft tissues. IMPRESSION: 1. Stable left thalamic intra-axial hemorrhage size since 05/15/2021. Stable regional edema and mild regional mass effect. 2. Stable right frontal approach EVD with slightly decreased lateral ventricle size since 05/15/2021. Moderate lateral ventricle IVH persists. There is less blood in the small 3rd and 4th ventricles now. 3. Underlying chronic small vessel disease. No new intracranial abnormality. Electronically Signed   By: Odessa Fleming M.D.   On: 05/18/2021 06:31   CT HEAD WO CONTRAST  Result Date: 05/15/2021 CLINICAL DATA:  Cerebral hemorrhage suspected. EXAM: CT HEAD WITHOUT CONTRAST TECHNIQUE: Contiguous axial images were obtained from the base of the skull through the vertex without intravenous contrast. COMPARISON:  MRI and CT of the brain May 14, 2021. FINDINGS: Brain: No significant interval change size of the left thalamic hemorrhage with hematoma now measuring approximately 3.5 x 3.3 x 2 cm compared to  3.3 x 3.3 x 2.2 cm on prior. Persistent hypodensity surrounding the hematoma related to vasogenic edema. Supratentorial and infratentorial intraventricular hemorrhage related to hematoma decompression into the right lateral ventricle, status post right frontal approach ventricular drain placement. Drain tip is at level of the right foramen of Monro. There is mild interval decrease in size of the supratentorial ventricles, particularly the temporal horns. Patchy hypodensity of the white matter of the cerebral hemispheres, nonspecific most likely related to small vessel ischemia. Remote lacunar infarct in the right radiata. Vascular: No hyperdense vessel or unexpected calcification. Skull: Negative for fracture or focal lesion. Sinuses/Orbits: No acute finding. Other: None. IMPRESSION: 1. Interval placement of a right frontal approach ventricular drain with the tip at the level of the right foramen of Monro. Interval mild decrease in size of the ventricular system, particularly the temporal horns of the lateral ventricles. 2. Stable appearance of left thalamic intraparenchymal hemorrhage with intraventricular extension. Electronically Signed   By: Baldemar Lenis M.D.   On:  05/15/2021 09:41   CT HEAD WO CONTRAST  Result Date: 05/14/2021 CLINICAL DATA:  Intraparenchymal hemorrhage.  Follow-up. EXAM: CT HEAD WITHOUT CONTRAST TECHNIQUE: Contiguous axial images were obtained from the base of the skull through the vertex without intravenous contrast. COMPARISON:  Same day CT head. FINDINGS: Mildly motion limited exam.  Within this limitation: Brain: When accounting for differences in technique, similar size of the acute intraparenchymal hemorrhage centered in the left thalamus which measures up to 3.3 x 2.2 x 3.3 cm (similar when remeasured on the prior). Similar regional mass effect. Similar intraventricular hemorrhage within the left greater than right lateral ventricles, third ventricle, and fourth  ventricle. Similar ventriculomegaly with mild periventricular edema and prominence of the temporal horns. Additional scattered white matter hypoattenuation is nonspecific but most likely related to chronic microvascular ischemic disease. No evidence of acute large vascular territory infarct. Similar suspected prior infarct in the right corona radiata. Vascular: No hyperdense vessel identified. Calcific atherosclerosis. Skull: No acute fracture. Sinuses/Orbits: No acute findings. Other: No mastoid effusions. IMPRESSION: When accounting for differences in technique, similar size of the acute left thalamic intraparenchymal hemorrhage. Similar intraventricular extension of hemorrhage and ventriculomegaly. Electronically Signed   By: Feliberto Harts MD   On: 05/14/2021 06:21   CT HEAD WO CONTRAST  Result Date: 05/14/2021 CLINICAL DATA:  Intraparenchymal hemorrhage. Follow-up of scans performed at [place] [place] [facility]. EXAM: CT HEAD WITHOUT CONTRAST TECHNIQUE: Contiguous axial images were obtained from the base of the skull through the vertex without intravenous contrast. COMPARISON:  None. I am not able to view the outside institution scans. FINDINGS: Brain: Intraparenchymal hematoma centered in the left thalamus measures 3.2 x 2.2 x 3.0 cm (volume = 11 cm^3). There is moderate hydrocephalus with periventricular hypoattenuation suggesting transependymal interstitial edema. There is rightward bulging of the septum pellucidum. Large amount of blood in the ventricles. Vascular: No abnormal hyperdensity of the major intracranial arteries or dural venous sinuses. No intracranial atherosclerosis. Skull: The visualized skull base, calvarium and extracranial soft tissues are normal. Sinuses/Orbits: No fluid levels or advanced mucosal thickening of the visualized paranasal sinuses. No mastoid or middle ear effusion. The orbits are normal. IMPRESSION: 1. Intraparenchymal hematoma centered in the left thalamus with  intraventricular extension and moderate hydrocephalus with transependymal interstitial edema. Critical Value/emergent results were called by telephone at the time of interpretation on 05/14/2021 at 12:58 am to provider ERIC [facility] , who verbally acknowledged these results. Electronically Signed   By: Deatra Robinson M.D.   On: 05/14/2021 00:58   MR MRA HEAD WO CONTRAST  Result Date: 05/14/2021 CLINICAL DATA:  Intracranial hemorrhage EXAM: MRI HEAD WITHOUT CONTRAST MRA HEAD WITHOUT CONTRAST TECHNIQUE: Multiplanar, multi-echo pulse sequences of the brain and surrounding structures were acquired without intravenous contrast. Angiographic images of the Circle of Willis were acquired using MRA technique without intravenous contrast. COMPARISON: No pertinent prior exam. COMPARISON:  Correlation made with prior head CT FINDINGS: MRI HEAD Brain: Left thalamic hemorrhage is again identified similar to the recent CT with acute blood products. There is mild surrounding edema. Significant intraventricular extension is present as seen on CT with resulting hydrocephalus. Focus susceptibility in the right corona radiata reflects chronic microhemorrhage associated with a lacunar infarct. Additional patchy and confluent areas of T2 hyperintensity in the supratentorial white matter are nonspecific but probably reflect moderate chronic microvascular ischemic changes. No post contrast imaging performed therefore potential underlying lesion is not well evaluated. Vascular: Major vessel flow voids at the skull base are preserved. Skull and  upper cervical spine: Normal marrow signal is preserved. Sinuses/Orbits: Paranasal sinuses are aerated. Orbits are unremarkable. Other: Sella is unremarkable.  Mastoid air cells are clear. MRA HEAD Intracranial internal carotid arteries are patent. Middle and anterior cerebral arteries are patent. Intracranial vertebral arteries, basilar artery, posterior cerebral arteries are patent. There is no  significant stenosis or aneurysm. No abnormal flow related enhancement in the region of thalamic hemorrhage. IMPRESSION: No substantial change in left thalamic hemorrhage with significant intraventricular extension and hydrocephalus as well as mild edema and mass effect compared to recent CT. 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 Moderate chronic microvascular ischemic changes. Unremarkable MRA. Electronically Signed   By: Guadlupe SpanishPraneil  Patel M.D.   On: 05/14/2021 11:18   MR BRAIN WO CONTRAST  Result Date: 05/14/2021 CLINICAL DATA:  Intracranial hemorrhage EXAM: MRI HEAD WITHOUT CONTRAST MRA HEAD WITHOUT CONTRAST TECHNIQUE: Multiplanar, multi-echo pulse sequences of the brain and surrounding structures were acquired without intravenous contrast. Angiographic images of the Circle of Willis were acquired using MRA technique without intravenous contrast. COMPARISON: No pertinent prior exam. COMPARISON:  Correlation made with prior head CT FINDINGS: MRI HEAD Brain: Left thalamic hemorrhage is again identified similar to the recent CT with acute blood products. There is mild surrounding edema. Significant intraventricular extension is present as seen on CT with resulting hydrocephalus. Focus susceptibility in the right corona radiata reflects chronic microhemorrhage associated with a lacunar infarct. Additional patchy and confluent areas of T2 hyperintensity in the supratentorial white matter are nonspecific but probably reflect moderate chronic microvascular ischemic changes. No post contrast imaging performed therefore potential underlying lesion is not well evaluated. Vascular: Major vessel flow voids at the skull base are preserved. Skull and upper cervical spine: Normal marrow signal is preserved. Sinuses/Orbits: Paranasal sinuses are aerated. Orbits are unremarkable. Other: Sella is unremarkable.  Mastoid air cells are clear. MRA HEAD Intracranial internal carotid  arteries are patent. Middle and anterior cerebral arteries are patent. Intracranial vertebral arteries, basilar artery, posterior cerebral arteries are patent. There is no significant stenosis or aneurysm. No abnormal flow related enhancement in the region of thalamic hemorrhage. IMPRESSION: No substantial change in left thalamic hemorrhage with significant intraventricular extension and hydrocephalus as well as mild edema and mass effect compared to recent CT. 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 Moderate chronic microvascular ischemic changes. Unremarkable MRA. Electronically Signed   By: Guadlupe SpanishPraneil  Patel M.D.   On: 05/14/2021 11:18   DG Pelvis Portable  Result Date: 05/14/2021 CLINICAL DATA:  60 year old female with intracranial hemorrhage. EXAM: PORTABLE PELVIS 1-2 VIEWS COMPARISON:  None. FINDINGS: Portable AP view at 0346 hours. Mild likely fibroid associated dystrophic calcifications in the pelvis eccentric to the right. Femoral heads normally located. Pelvis and proximal femurs appear intact. No acute osseous abnormality identified. Negative visible bowel gas pattern. IMPRESSION: Negative. Electronically Signed   By: Odessa FlemingH  Hall M.D.   On: 05/14/2021 06:18   DG CHEST PORT 1 VIEW  Result Date: 05/17/2021 CLINICAL DATA:  Respiratory failure EXAM: PORTABLE CHEST 1 VIEW COMPARISON:  May 14, 2021 FINDINGS: Feeding tube tip is below the diaphragm. No pneumothorax. There is no edema or airspace opacity. There is cardiomegaly with pulmonary vascularity normal. No adenopathy. No bone lesions. IMPRESSION: No edema or airspace opacity. Cardiomegaly, stable. Feeding tube tip below diaphragm. Electronically Signed   By: Bretta BangWilliam  Woodruff III M.D.   On: 05/17/2021 14:17   DG CHEST PORT 1 VIEW  Result Date: 05/14/2021 CLINICAL DATA:  Provided history: Follow-up. EXAM: PORTABLE CHEST 1 VIEW COMPARISON:  None. FINDINGS: Cardiomediastinal silhouette is enlarged, likely  accentuated by AP technique. Calcific atherosclerosis of the aorta. Low lung volumes. No consolidation. EKG leads bilaterally. No visible pleural effusions or pneumothorax on this single semi erect AP radiograph. IMPRESSION: 1. Low lung volumes without evidence of acute cardiopulmonary disease. 2. Cardiomegaly. Electronically Signed   By: Feliberto Harts MD   On: 05/14/2021 06:13   DG Abd Portable 1V  Result Date: 05/15/2021 CLINICAL DATA:  Feeding tube placement. EXAM: PORTABLE ABDOMEN - 1 VIEW COMPARISON:  None. FINDINGS: Feeding tube is in place with the tip in the distal stomach directed toward the duodenum. IMPRESSION: As above. Electronically Signed   By: Drusilla Kanner M.D.   On: 05/15/2021 11:40   ECHOCARDIOGRAM COMPLETE  Result Date: 05/16/2021    ECHOCARDIOGRAM REPORT   Patient Name:   ANALEIA ISMAEL Date of Exam: 05/14/2021 Medical Rec #:  [id number]    Height: Accession #:    [id number]   Weight:       166.0 lb Date of Birth:  February 10, 1961    BSA:          1.783 m Patient Age:    59 years     BP:           122/69 mmHg Patient Gender: F            HR:           77 bpm. Exam Location:  Inpatient Procedure: 2D Echo, Cardiac Doppler and Color Doppler Indications:    Stroke  History:        Patient has no prior history of Echocardiogram examinations.  Sonographer:    Ross Ludwig RDCS (AE) Referring Phys: [id number] ERIC LINDZEN  Sonographer Comments: Patient not responsive to request to sniff for IVC collapse test. IMPRESSIONS  1. Left ventricular ejection fraction, by estimation, is 50 to 55%. The left ventricle has low normal function. The left ventricle has no regional wall motion abnormalities. There is moderate thickening of the septum. The rest of the LV segments demonstrate mild left ventricular hypertrophy. Left ventricular diastolic parameters are consistent with Grade II diastolic dysfunction (pseudonormalization). Elevated left ventricular end-diastolic pressure.  2. The average left ventricular global  longitudinal strain is -6.7 %. The global longitudinal strain is abnormal. Strain pattern shows relative apical sparring which may be suggestive of amyloid in the right clinic context.  3. Right ventricular systolic function is normal. The right ventricular size is normal.  4. The mitral valve is normal in structure. Trivial mitral valve regurgitation. No evidence of mitral stenosis.  5. The aortic valve is tricuspid. There is mild calcification of the aortic valve. There is mild thickening of the aortic valve. Aortic valve regurgitation is not visualized. Mild aortic valve sclerosis is present, with no evidence of aortic valve stenosis.  6. The inferior vena cava is dilated in size with <50% respiratory variability, suggesting right atrial pressure of 15 mmHg.  7. Left atrial size was mildly dilated. Comparison(s): No prior Echocardiogram. Conclusion(s)/Recommendation(s): No intracardiac source of embolism detected on this transthoracic study. A transesophageal echocardiogram is recommended to exclude cardiac source of embolism if clinically indicated. FINDINGS  Left Ventricle: Left ventricular ejection fraction, by estimation, is 50 to 55%. The left ventricle has low normal function. The left ventricle has no regional wall motion abnormalities. The average left ventricular global longitudinal strain is -6.7 %.  The global longitudinal  strain is abnormal. The left ventricular internal cavity size was normal in size. There is moderate thickening of the septum. The rest of the LV segments demonstrate mild left ventricular hypertrophy. Left ventricular diastolic parameters are consistent with Grade II diastolic dysfunction (pseudonormalization). Elevated left ventricular end-diastolic pressure. Right Ventricle: The right ventricular size is normal. No increase in right ventricular wall thickness. Right ventricular systolic function is normal. Left Atrium: Left atrial size was mildly dilated. Right Atrium: Right atrial  size was normal in size. Pericardium: There is no evidence of pericardial effusion. Mitral Valve: The mitral valve is normal in structure. There is mild thickening of the mitral valve leaflet(s). There is mild calcification of the mitral valve leaflet(s). Mild mitral annular calcification. Trivial mitral valve regurgitation. No evidence  of mitral valve stenosis. MV peak gradient, 7.1 mmHg. The mean mitral valve gradient is 4.0 mmHg. Tricuspid Valve: The tricuspid valve is normal in structure. Tricuspid valve regurgitation is trivial. No evidence of tricuspid stenosis. Aortic Valve: The aortic valve is tricuspid. There is mild calcification of the aortic valve. There is mild thickening of the aortic valve. Aortic valve regurgitation is not visualized. Mild aortic valve sclerosis is present, with no evidence of aortic valve stenosis. Aortic valve mean gradient measures 8.0 mmHg. Aortic valve peak gradient measures 16.3 mmHg. Aortic valve area, by VTI measures 2.05 cm. Pulmonic Valve: The pulmonic valve was normal in structure. Pulmonic valve regurgitation is trivial. No evidence of pulmonic stenosis. Aorta: The aortic root and ascending aorta are structurally normal, with no evidence of dilitation. Venous: The inferior vena cava is dilated in size with less than 50% respiratory variability, suggesting right atrial pressure of 15 mmHg. IAS/Shunts: No atrial level shunt detected by color flow Doppler.  LEFT VENTRICLE PLAX 2D LVIDd:         4.70 cm  Diastology LVIDs:         4.20 cm  LV e' medial:    5.55 cm/s LV PW:         1.60 cm  LV E/e' medial:  23.2 LV IVS:        1.80 cm  LV e' lateral:   6.31 cm/s LVOT diam:     1.80 cm  LV E/e' lateral: 20.4 LV SV:         69 LV SV Index:   39       2D Longitudinal Strain LVOT Area:     2.54 cm 2D Strain GLS Avg:     -6.7 %  RIGHT VENTRICLE             IVC RV Basal diam:  3.50 cm     IVC diam: 2.30 cm RV S prime:     13.20 cm/s TAPSE (M-mode): 3.2 cm LEFT ATRIUM              Index       RIGHT ATRIUM           Index LA diam:        4.10 cm 2.30 cm/m  RA Area:     16.30 cm LA Vol (A2C):   42.1 ml 23.61 ml/m RA Volume:   45.00 ml  25.23 ml/m LA Vol (A4C):   43.6 ml 24.45 ml/m LA Biplane Vol: 47.3 ml 26.52 ml/m  AORTIC VALVE AV Area (Vmax):    1.86 cm AV Area (Vmean):   2.09 cm AV Area (VTI):     2.05 cm AV Vmax:  202.00 cm/s AV Vmean:          135.000 cm/s AV VTI:            0.336 m AV Peak Grad:      16.3 mmHg AV Mean Grad:      8.0 mmHg LVOT Vmax:         148.00 cm/s LVOT Vmean:        111.000 cm/s LVOT VTI:          0.271 m LVOT/AV VTI ratio: 0.81  AORTA Ao Root diam: 2.90 cm Ao Asc diam:  3.20 cm MITRAL VALVE MV Area (PHT): 3.08 cm     SHUNTS MV Area VTI:   1.83 cm     Systemic VTI:  0.27 m MV Peak grad:  7.1 mmHg     Systemic Diam: 1.80 cm MV Mean grad:  4.0 mmHg MV Vmax:       1.33 m/s MV Vmean:      90.6 cm/s MV Decel Time: 246 msec MV E velocity: 129.00 cm/s MV A velocity: 118.00 cm/s MV E/A ratio:  1.09 Laurance Flatten MD Electronically signed by Laurance Flatten MD Signature Date/Time: 05/16/2021/7:51:23 AM    Final    VAS US CAROTID  Result Date: 05/15/2021 Carotid Arterial Duplex Study Patient Name:  ALEAYAH CHICO  Date of Exam:   05/14/2021 Medical Rec #: [id number]     Accession #:    [id number] Date of Birth: 1961-01-15     Patient Gender: F Patient Age:   67Y Exam Location:  [facility] Procedure:      VAS US CAROTID Referring Phys: [id number] Marvel Plan --------------------------------------------------------------------------------  Indications:       Hypertensive emergency (122/128). 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 Risk Factors:      Hypertension. Comparison Study:  No prior study on file Performing Technologist: Sherren Kerns RVS  Examination Guidelines: A complete evaluation includes B-mode imaging, spectral Doppler, color Doppler, and power Doppler as needed of all accessible portions of each  vessel. Bilateral testing is considered an integral part of a complete examination. Limited examinations for reoccurring indications may be performed as noted.  Right Carotid Findings: +----------+--------+--------+--------+------------------+--------+           PSV cm/sEDV cm/sStenosisPlaque DescriptionComments +----------+--------+--------+--------+------------------+--------+ CCA Prox  96      9                                          +----------+--------+--------+--------+------------------+--------+ CCA Distal104     13                                         +----------+--------+--------+--------+------------------+--------+ ICA Prox  66      15      1-39%   heterogenous               +----------+--------+--------+--------+------------------+--------+ ICA Distal54      13                                         +----------+--------+--------+--------+------------------+--------+ ECA  151     22                                         +----------+--------+--------+--------+------------------+--------+ +----------+--------+-------+--------+-------------------+           PSV cm/sEDV cmsDescribeArm Pressure (mmHG) +----------+--------+-------+--------+-------------------+ Subclavian150                                        +----------+--------+-------+--------+-------------------+ +---------+--------+--+--------+--+---------+ VertebralPSV cm/s41EDV cm/s12Antegrade +---------+--------+--+--------+--+---------+  Left Carotid Findings: +----------+--------+--------+--------+------------------+------------------+           PSV cm/sEDV cm/sStenosisPlaque DescriptionComments           +----------+--------+--------+--------+------------------+------------------+ CCA Prox  180     15                                                   +----------+--------+--------+--------+------------------+------------------+ CCA Distal98      13                                 intimal thickening +----------+--------+--------+--------+------------------+------------------+ ICA Prox  70      22      1-39%                     intimal thickening +----------+--------+--------+--------+------------------+------------------+ ICA Distal68      22                                                   +----------+--------+--------+--------+------------------+------------------+ ECA       91      18                                                   +----------+--------+--------+--------+------------------+------------------+ +----------+--------+--------+--------+-------------------+           PSV cm/sEDV cm/sDescribeArm Pressure (mmHG) +----------+--------+--------+--------+-------------------+ [id number]                                          +----------+--------+--------+--------+-------------------+ +---------+--------+--+--------+--+---------+ VertebralPSV cm/s61EDV cm/s13Antegrade +---------+--------+--+--------+--+---------+   Summary: Right Carotid: Velocities in the right ICA are consistent with a 1-39% stenosis. Left Carotid: Velocities in the left ICA are consistent with a 1-39% stenosis. Vertebrals: Bilateral vertebral arteries demonstrate antegrade flow. *See table(s) above for measurements and observations.  Electronically signed by Marvel Plan MD on 05/15/2021 at 2:54:40 PM.    Final      PHYSICAL EXAM  Temp:  [98.2 F (36.8 C)-101.9 F (38.8 C)] 101.7 F (38.7 C) (06/06 1200) Pulse Rate:  [68-85] 77 (06/06 1200) Resp:  [10-21] 15 (06/06 1200) BP: (112-164)/(66-99) 146/91 (06/06 1200) SpO2:  [89 %-100 %] 94 % (06/06 1200) Weight:  [77.7 kg] 77.7 kg (06/06 0500)  General -middle-aged African-American lady, no acute distress Cardiovascular - Regular rhythm  and rate. Neuro - drowsy, eyes open, awake,  Attends briefly, but has to be reminded to stay awake during exam. follow some simple commands, ft gaze  preference and barely cross midline, not blinking to visual threat consistently, PERRL. Right facial droop. Tongue protrusion not cooperative. Spontaneously moving LUE against gravity, no drift, flaccid on the RUE however with pain, bicep 3-/5. Withdraw to pain LLE 3/5, but only mild withdraw 2/5 at RLE. Sensation, coordination not cooperative and gait not tested.  ASSESSMENT/PLAN Ms. Rubbie Goostree is a 60 y.o. female with no significant history admitted for N/V, right sided weakness and facial droop. No tPA given due to ICH.    ICH with IVH:  left thalamic ICH with extensive IVH -  secondary to hypertensive emergency  CT head left thalamic ICH with IVH  Repeat CT head stable ICH and ventricles  MRI  Stable ICH and ventricles  MRA  Unremarkable.   Carotid Doppler  <50% BL  2D Echo  Normal EF 50-55%, left ventricular global longitudinal strain is  apical sparring which may be suggestive of amyloid in the right clinic  context.    LDL 121  HgbA1c 5.7  SCDs for VTE prophylaxis, can start chemical prophylaxis when drain is off  No antithrombotic prior to admission, now on No antithrombotic due to ICH  Ongoing aggressive stroke risk factor management  Therapy recommendations:  CIR  Disposition:  Pending   Hydrocephalus   Slightly enlarged ventricles   Repeat CT and MRI showed stable ventricle size  S/p EVD yesterday, draining well 10cc/h  Repeat CT 6/3 mildly decreased ventricle size  On keppra 500mg  bid for seizure prophylaxis   Close monitoring  Hypertensive emergency  . HCTZ 13 mg daily . Lisinoril 20 mg daily  . Carvedilol 12.5 bid . On labetalol PRN . Maintain SBP <160 for now, continue to titrate Clevidipine   Long term BP goal normotensive  Hyperlipidemia  Home meds:  none   LDL 121, goal < 70  Consider statin at discharge  Dysphagia   Did not pass swallow  NPO now  Cortrak    Other Active Problems Fever overnight with increasing WBC> No  clear sign of infection, ventriculostomy site looks good, no cough, no cough, no dysuria;  check urinalysis, check CXR, monitor drain site   ICU team following for rest of fever work up  Hospital day # 4  Desiree Metzger-Cihelka, ARNP-C, ANVP-BC Pager: [phone] 05/18/2021 3:27 PM I have personally obtained history,examined this patient, reviewed notes, independently viewed imaging studies, participated in medical decision making and plan of care.ROS completed by me personally and pertinent positives fully documented  I have made any additions or clarifications directly to the above note. Agree with note above.  Patient has been weaned off Cleviprex drip and blood pressure is adequately controlled.  Continue ventriculostomy and is draining well and hopefully will try to wean later this week as per neurosurgery..  Start Lovenox for DVT prophylaxis.  Therapy consults.  She has low-grade fever and increasing white count.  We will follow testing to rule out infection.  Discussed with Dr. Merrily Pew critical care medicine This patient is critically ill and at significant risk of neurological worsening, death and care requires constant monitoring of vital signs, hemodynamics,respiratory and cardiac monitoring, extensive review of multiple databases, frequent neurological assessment, discussion with family, other specialists and medical decision making of high complexity.I have made any additions or clarifications directly to the above note.This critical care time does not reflect procedure  time, or teaching time or supervisory time of PA/NP/Med Resident etc but could involve care discussion time.  I spent 30 minutes of neurocritical care time  in the care of  this patient.     Delia Heady, MD Medical Director [facility] Pager: [phone] 05/18/2021 4:09 PM  To contact Stroke Continuity provider, please refer to [email or web address]. After hours, contact General Neurology

## 2021-05-18 NOTE — Progress Notes (Signed)
Physical Therapy Treatment Patient Details Name: Marissa Morris MRN: [id number] DOB: Oct 20, 1961 Today's Date: 05/18/2021    History of Present Illness Marissa Morris is a 60 y.o. female presenting to [facility] in transfer from [facility] for management of acute intracranial hemorrhage. 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 6/3 interval placement of R frontal EVD. No significant PMH.    PT Comments    Pt progressing well towards her physical therapy goals; exhibits improved arousal, command following and sitting balance today. Requiring two person maximal assist to stand from edge of bed with right knee blocked and then subsequently performed a low pivot to the chair. Continues with R hemiparesis, decreased balance, and cognition. Continue to recommend comprehensive inpatient rehab (CIR) for post-acute therapy needs.    Follow Up Recommendations  CIR     Equipment Recommendations  3in1 (PT);Wheelchair (measurements PT);Wheelchair cushion (measurements PT)    Recommendations for Other Services       Precautions / Restrictions Precautions Precautions: Fall;Other (comment) Precaution Comments: EVD, posey belt, L wrist restraint and mitten, NGT Restrictions Weight Bearing Restrictions: No    Mobility  Bed Mobility Overal bed mobility: Needs Assistance Bed Mobility: Rolling;Sidelying to Sit Rolling: Min assist   Supine to sit: Mod assist;+2 for physical assistance     General bed mobility comments: MinA to roll towards right, pt initiating well, modA + 2 for management of RLE off edge of bed and for trunk control    Transfers Overall transfer level: Needs assistance Equipment used: None Transfers: Sit to/from [employer] Sit to Stand: Max assist;+2 physical assistance   Squat pivot transfers: Max assist;+2 physical assistance     General transfer comment: Pt able to stand x 2, once from edge of bed and once from  recliner with right knee block. Verbal/tactile cues for upright posture, hip extension, R knee extension. Performed low pivot towards left with maxA + 2, pt does initiate well  Ambulation/Gait                 Stairs             Wheelchair Mobility    Modified Rankin (Stroke Patients Only) Modified Rankin (Stroke Patients Only) Pre-Morbid Rankin Score: No symptoms Modified Rankin: Severe disability     Balance Overall balance assessment: Needs assistance Sitting-balance support: Feet supported Sitting balance-Leahy Scale: Poor Sitting balance - Comments: Pt progressing to min guard assist, cues for midline and upright posture Postural control: Posterior lean;Right lateral lean Standing balance support: No upper extremity supported;During functional activity Standing balance-Leahy Scale: Poor Standing balance comment: reliant on external support                            Cognition Arousal/Alertness: Lethargic Behavior During Therapy: Flat affect Overall Cognitive Status: Difficult to assess                                 General Comments: Pt opening eyes intermittently, smiling at therapist. When asked what her fiance's name is, pt responding "twelve." Following left sided commands      Exercises      General Comments        Pertinent Vitals/Pain Pain Assessment: Faces Faces Pain Scale: No hurt    Home Living  Prior Function            PT Goals (current goals can now be found in the care plan section) Acute Rehab PT Goals Patient Stated Goal: pt fiance would like her to get stronger PT Goal Formulation: With patient/family Time For Goal Achievement: 05/30/21 Potential to Achieve Goals: Good Progress towards PT goals: Progressing toward goals    Frequency    Min 4X/week      PT Plan Current plan remains appropriate    Co-evaluation              AM-PAC PT "6 Clicks"  Mobility   Outcome Measure  Help needed turning from your back to your side while in a flat bed without using bedrails?: A Little Help needed moving from lying on your back to sitting on the side of a flat bed without using bedrails?: A Lot Help needed moving to and from a bed to a chair (including a wheelchair)?: Total Help needed standing up from a chair using your arms (e.g., wheelchair or bedside chair)?: Total Help needed to walk in hospital room?: Total Help needed climbing 3-5 steps with a railing? : Total 6 Click Score: 9    End of Session Equipment Utilized During Treatment: Gait belt Activity Tolerance: Patient tolerated treatment well Patient left: with call bell/phone within reach;with restraints reapplied;in chair;with chair alarm set;with family/visitor present Nurse Communication: Mobility status PT Visit Diagnosis: Unsteadiness on feet (R26.81);Hemiplegia and hemiparesis Hemiplegia - Right/Left: Right Hemiplegia - dominant/non-dominant: Dominant Hemiplegia - caused by: Other Nontraumatic intracranial hemorrhage     Time: 1132-1200 PT Time Calculation (min) (ACUTE ONLY): 28 min  Charges:  $Therapeutic Activity: 23-37 mins                     Lillia Pauls, PT, DPT Acute Rehabilitation Services Pager [phone] Office [phone]    Norval Morton 05/18/2021, 1:21 PM

## 2021-05-19 DIAGNOSIS — I619 Nontraumatic intracerebral hemorrhage, unspecified: Secondary | ICD-10-CM

## 2021-05-19 LAB — CBC
HCT: 43.6 % (ref 36.0–46.0)
Hemoglobin: 14.1 g/dL (ref 12.0–15.0)
MCH: 31.5 pg (ref 26.0–34.0)
MCHC: 32.3 g/dL (ref 30.0–36.0)
MCV: 97.5 fL (ref 80.0–100.0)
Platelets: 201 10*3/uL (ref 150–400)
RBC: 4.47 MIL/uL (ref 3.87–5.11)
RDW: 13 % (ref 11.5–15.5)
WBC: 11 10*3/uL — ABNORMAL HIGH (ref 4.0–10.5)
nRBC: 0 % (ref 0.0–0.2)

## 2021-05-19 LAB — BASIC METABOLIC PANEL
Anion gap: 10 (ref 5–15)
BUN: 26 mg/dL — ABNORMAL HIGH (ref 6–20)
CO2: 28 mmol/L (ref 22–32)
Calcium: 8.6 mg/dL — ABNORMAL LOW (ref 8.9–10.3)
Chloride: 99 mmol/L (ref 98–111)
Creatinine, Ser: 0.81 mg/dL (ref 0.44–1.00)
GFR, Estimated: >60 mL/min (ref >60)
Glucose, Bld: 148 mg/dL — ABNORMAL HIGH (ref 70–99)
Potassium: 4 mmol/L (ref 3.5–5.1)
Sodium: 137 mmol/L (ref 135–145)

## 2021-05-19 LAB — GLUCOSE, CAPILLARY
Glucose-Capillary: 115 mg/dL — ABNORMAL HIGH (ref 70–99)
Glucose-Capillary: 125 mg/dL — ABNORMAL HIGH (ref 70–99)
Glucose-Capillary: 136 mg/dL — ABNORMAL HIGH (ref 70–99)
Glucose-Capillary: 149 mg/dL — ABNORMAL HIGH (ref 70–99)
Glucose-Capillary: 186 mg/dL — ABNORMAL HIGH (ref 70–99)

## 2021-05-19 MED ORDER — CEFTRIAXONE SODIUM 500 MG IJ SOLR
500.0000 mg | Freq: Once | INTRAMUSCULAR | Status: AC
  Administered 2021-05-19: 500 mg via INTRAMUSCULAR
  Filled 2021-05-19: qty 500

## 2021-05-19 MED ORDER — MAGIC MOUTHWASH
1.0000 mL | Freq: Three times a day (TID) | ORAL | Status: DC
  Administered 2021-05-19 – 2021-05-30 (×31): 1 mL via ORAL
  Filled 2021-05-19 (×37): qty 5

## 2021-05-19 MED ORDER — ORAL CARE MOUTH RINSE
15.0000 mL | Freq: Two times a day (BID) | OROMUCOSAL | Status: DC
  Administered 2021-05-19 – 2021-05-30 (×21): 15 mL via OROMUCOSAL

## 2021-05-19 MED ORDER — AMLODIPINE 1 MG/ML ORAL SUSPENSION: 5.0000 mg | Freq: Every day | ORAL | Status: DC

## 2021-05-19 MED ORDER — METRONIDAZOLE 50 MG/ML ORAL SUSPENSION
500.0000 mg | Freq: Two times a day (BID) | ORAL | Status: DC
  Administered 2021-05-19 – 2021-05-22 (×8): 500 mg
  Filled 2021-05-19 (×9): qty 10

## 2021-05-19 MED ORDER — CARVEDILOL 12.5 MG PO TABS
25.0000 mg | ORAL_TABLET | Freq: Two times a day (BID) | ORAL | Status: DC
  Administered 2021-05-19 – 2021-05-21 (×4): 25 mg
  Filled 2021-05-19 (×4): qty 2

## 2021-05-19 MED ORDER — AMLODIPINE BESYLATE 5 MG PO TABS
5.0000 mg | ORAL_TABLET | Freq: Every day | ORAL | Status: DC
  Administered 2021-05-19: 5 mg
  Filled 2021-05-19: qty 1

## 2021-05-19 MED ORDER — CHLORHEXIDINE GLUCONATE 0.12 % MT SOLN
15.0000 mL | Freq: Two times a day (BID) | OROMUCOSAL | Status: DC
  Administered 2021-05-19 – 2021-05-30 (×20): 15 mL via OROMUCOSAL
  Filled 2021-05-19 (×19): qty 15

## 2021-05-19 MED ORDER — AMLODIPINE BESYLATE 10 MG PO TABS
10.0000 mg | ORAL_TABLET | Freq: Every day | ORAL | Status: DC
  Administered 2021-05-20 – 2021-05-27 (×8): 10 mg
  Filled 2021-05-19 (×8): qty 1

## 2021-05-19 MED ORDER — LISINOPRIL 20 MG PO TABS
40.0000 mg | ORAL_TABLET | Freq: Every day | ORAL | Status: DC
  Administered 2021-05-20 – 2021-05-27 (×8): 40 mg
  Filled 2021-05-19 (×8): qty 2

## 2021-05-19 MED ORDER — DOXYCYCLINE HYCLATE 100 MG PO TABS
100.0000 mg | ORAL_TABLET | Freq: Two times a day (BID) | ORAL | Status: DC
  Administered 2021-05-19 – 2021-05-21 (×5): 100 mg
  Filled 2021-05-19 (×5): qty 1

## 2021-05-19 NOTE — Progress Notes (Signed)
0800:  Patient more lethargic today on exam, neurology and neurosurgery updated.  EVD level to 20 H2O per neurosurgery.  CCM aware.

## 2021-05-19 NOTE — Progress Notes (Signed)
Subjective: NAEs o/n  Objective: Vital signs in last 24 hours: Temp:  [98.8 F (37.1 C)-101.9 F (38.8 C)] 99.1 F (37.3 C) (06/07 0800) Pulse Rate:  [58-93] 73 (06/07 1100) Resp:  [16-28] 16 (06/07 1100) BP: (115-177)/(65-142) 120/68 (06/07 1100) SpO2:  [89 %-100 %] 89 % (06/07 1100)  Intake/Output from previous day: 06/06 0701 - 06/07 0700 In: 1786.2 [I.V.:346.2; NG/GT:1440] Out: 1259 [Urine:1050; Drains:209] Intake/Output this shift: Total I/O In: 423.3 [I.V.:183.3; NG/GT:240] Out: 23 [Drains:23]  Eyes open to stim, f/c on left Mumbles CSF now darker tinged.  Lab Results: Recent Labs    05/18/21 0444 05/19/21 0347  WBC 10.9* 11.0*  HGB 14.1 14.1  HCT 43.7 43.6  PLT 184 201   BMET Recent Labs    05/18/21 0444 05/19/21 0347  NA 138 137  K 3.7 4.0  CL 100 99  CO2 29 28  GLUCOSE 105* 148*  BUN 25* 26*  CREATININE 0.82 0.81  CALCIUM 8.4* 8.6*    Studies/Results: CT HEAD WO CONTRAST  Result Date: 05/18/2021 CLINICAL DATA:  60 year old female with left thalamic hemorrhage, IVH, EVD. EXAM: CT HEAD WITHOUT CONTRAST TECHNIQUE: Contiguous axial images were obtained from the base of the skull through the vertex without intravenous contrast. COMPARISON:  Head CT 05/15/2021 and earlier. FINDINGS: Brain: Right superior frontal approach EVD with catheter terminating at the 3rd ventricle and communicating with the right lateral ventricle on coronal image 35. The 3rd and 4th ventricles remain relatively decompressed. There is less 3rd and 4th ventricular IVH. Lateral ventricle size also mildly decreased since 05/15/2021, although with moderate residual lateral ventricular blood greater on the left. Oval left thalamic intra-axial hemorrhage encompasses 34 x 22 by 33 mm now (AP by transverse by CC) for an estimated blood volume of 12 mL, and has not changed since 05/15/2021. Regional edema and mild regional mass effect are stable. Stable gray-white matter differentiation  elsewhere with Patchy and confluent white matter hypodensity. No new cortically based infarct identified. Basilar cisterns are patent. No significant midline shift. Vascular: Calcified atherosclerosis at the skull base. Skull: Stable right superior burr hole. Sinuses/Orbits: Visualized paranasal sinuses and mastoids are stable and well aerated. Other: Right nasoenteric tube in place. Mild postoperative changes to the scalp vertex. Disconjugate gaze but otherwise negative orbits soft tissues. IMPRESSION: 1. Stable left thalamic intra-axial hemorrhage size since 05/15/2021. Stable regional edema and mild regional mass effect. 2. Stable right frontal approach EVD with slightly decreased lateral ventricle size since 05/15/2021. Moderate lateral ventricle IVH persists. There is less blood in the small 3rd and 4th ventricles now. 3. Underlying chronic small vessel disease. No new intracranial abnormality. Electronically Signed   By: Odessa Fleming M.D.   On: 05/18/2021 06:31   DG CHEST PORT 1 VIEW  Result Date: 05/17/2021 CLINICAL DATA:  Respiratory failure EXAM: PORTABLE CHEST 1 VIEW COMPARISON:  May 14, 2021 FINDINGS: Feeding tube tip is below the diaphragm. No pneumothorax. There is no edema or airspace opacity. There is cardiomegaly with pulmonary vascularity normal. No adenopathy. No bone lesions. IMPRESSION: No edema or airspace opacity. Cardiomegaly, stable. Feeding tube tip below diaphragm. Electronically Signed   By: Bretta Bang III M.D.   On: 05/17/2021 14:17    Assessment/Plan: 60 yo F with L thalamic ICH with IVH - will raise EVD to 20 today  Bedelia Person 05/19/2021, 12:22 PM

## 2021-05-19 NOTE — Progress Notes (Signed)
Physical Therapy Treatment Patient Details Name: Marissa Morris MRN: [id number] DOB: [birth year] Today's Date: 05/19/2021    History of Present Illness Marissa Morris is a 60 y.o. female presenting to [facility] in transfer from [facility] for management of acute intracranial hemorrhage. 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 6/3 interval placement of R frontal EVD. No significant PMH.    PT Comments    Pt progressing towards her physical therapy goals; nodding yes/no to questions and following left sided commands. Utilized Corene Cornea to provide bilateral knee block, facilitate weightbearing through RLE and transitions to standing. Pt continues with right lateral lean in sitting and standing but able to correct with multimodal cueing. Pt demonstrates right hemiparesis, balance impairments, decreased cognition. Continue to recommend comprehensive inpatient rehab (CIR) for post-acute therapy needs.   Follow Up Recommendations  CIR     Equipment Recommendations  3in1 (PT);Wheelchair (measurements PT);Wheelchair cushion (measurements PT)    Recommendations for Other Services       Precautions / Restrictions Precautions Precautions: Fall;Other (comment) Precaution Comments: EVD, posey belt, NGT Restrictions Weight Bearing Restrictions: No    Mobility  Bed Mobility Overal bed mobility: Needs Assistance Bed Mobility: Rolling;Sidelying to Sit Rolling: Min assist   Supine to sit: +2 for physical assistance;Max assist     General bed mobility comments: MinA to roll towards right, pt initiating well, maxA + 2 for management of BLE's off edge of bed and for trunk control    Transfers Overall transfer level: Needs assistance Equipment used: None Transfers: Sit to/from [employer] Sit to Stand: +2 physical assistance;Mod assist         General transfer comment: Use of Stedy, cues for pulling up with L hand, modA + 2 to boost  up from elevated surface. Multimodal cues for midline positioning, upward gaze, upright posture  Ambulation/Gait                 Stairs             Wheelchair Mobility    Modified Rankin (Stroke Patients Only) Modified Rankin (Stroke Patients Only) Pre-Morbid Rankin Score: No symptoms Modified Rankin: Severe disability     Balance Overall balance assessment: Needs assistance Sitting-balance support: Feet supported Sitting balance-Leahy Scale: Poor Sitting balance - Comments: Pt requiring min guard-modA, cues for midline and upright posture Postural control: Posterior lean;Right lateral lean Standing balance support: No upper extremity supported;During functional activity Standing balance-Leahy Scale: Poor Standing balance comment: reliant on external support                            Cognition Arousal/Alertness: Lethargic Behavior During Therapy: Flat affect Overall Cognitive Status: Difficult to assess                                 General Comments: Limited verbalizations with intermittent eye opening. Able to follow majority of left sided commands. Nodding yes/no to questions.      Exercises      General Comments        Pertinent Vitals/Pain Pain Assessment: Faces Faces Pain Scale: No hurt    Home Living                      Prior Function            PT Goals (current goals can now be  found in the care plan section) Acute Rehab PT Goals Patient Stated Goal: pt fiance would like her to get stronger PT Goal Formulation: With patient/family Time For Goal Achievement: 05/30/21 Potential to Achieve Goals: Good Progress towards PT goals: Progressing toward goals    Frequency    Min 4X/week      PT Plan Current plan remains appropriate    Co-evaluation PT/OT/SLP Co-Evaluation/Treatment: Yes Reason for Co-Treatment: Complexity of the patient's impairments (multi-system involvement);Necessary to  address cognition/behavior during functional activity;For patient/therapist safety;To address functional/ADL transfers PT goals addressed during session: Mobility/safety with mobility        AM-PAC PT "6 Clicks" Mobility   Outcome Measure  Help needed turning from your back to your side while in a flat bed without using bedrails?: A Little Help needed moving from lying on your back to sitting on the side of a flat bed without using bedrails?: Total Help needed moving to and from a bed to a chair (including a wheelchair)?: Total Help needed standing up from a chair using your arms (e.g., wheelchair or bedside chair)?: Total Help needed to walk in hospital room?: Total Help needed climbing 3-5 steps with a railing? : Total 6 Click Score: 8    End of Session Equipment Utilized During Treatment: Gait belt Activity Tolerance: Patient tolerated treatment well Patient left: with call bell/phone within reach;with restraints reapplied;in chair;with chair alarm set;with family/visitor present Nurse Communication: Mobility status PT Visit Diagnosis: Unsteadiness on feet (R26.81);Hemiplegia and hemiparesis Hemiplegia - Right/Left: Right Hemiplegia - dominant/non-dominant: Dominant Hemiplegia - caused by: Other Nontraumatic intracranial hemorrhage     Time: 0933-1007 PT Time Calculation (min) (ACUTE ONLY): 34 min  Charges:  $Therapeutic Activity: 8-22 mins                     Lillia Pauls, PT, DPT Acute Rehabilitation Services Pager [phone] Office [phone]    Norval Morton 05/19/2021, 1:03 PM

## 2021-05-19 NOTE — Progress Notes (Addendum)
Inpatient Rehabilitation Admissions Coordinator  I met at bedside with patient and her sister, Vaughan Basta and brother in Sports coach. I reviewed goals and expectations of a possible Cir admit. Patient lived alone prior to admit. Vaughan Basta will discuss with her sister, [name], caregiver supports available. I discussed the need for 24/7 caregiver supports after any CIR admit that patient would be expected to need. If this is not available, we would recommend SNF rehab until patient progresses to be able to return home alone with intermittent assistance. I also asked Vaughan Basta to have her sister, [name], to provide me with her payor insurance card. We will follow her progress.  Danne Baxter, RN, MSN Rehab Admissions Coordinator [phone] 05/19/2021 10:56 AM   Patient's insurance information has been provided to the pre service center. [employer] # [id number]  Provider phone number is [phone]

## 2021-05-19 NOTE — Progress Notes (Signed)
Nutrition Follow-up  DOCUMENTATION CODES:   Not applicable  INTERVENTION:   Tube feeding via Cortrak: Osmolite 1.2 at 60 ml/h (1440 ml per day) Prosource TF 45 ml BID  Provides 1808 kcal, 101 gm protein, 1167 ml free water daily   NUTRITION DIAGNOSIS:   Inadequate oral intake related to inability to eat as evidenced by NPO status. Ongoing.   GOAL:   Patient will meet greater than or equal to 90% of their needs Met with TF.   MONITOR:   Diet advancement,TF tolerance  REASON FOR ASSESSMENT:   Consult Enteral/tube feeding initiation and management  ASSESSMENT:   Pt with PMH of HTN and HLD admitted with left thalamic ICH with extensive IVH secondary to hypertensive emergency.    Pt discussed during ICU rounds and with RN.  SLP holding on FEES due to alertness level.    6/3 failed swallow, s/p cortrak placement with tip gastric    Medications reviewed and include: magic mouthwash, senokot-s Cleviprex @ 34 ml/hr providing: 1632 kcal  Labs reviewed: CBG's: 125-149  ICP: 209 ml   Diet Order:   Diet Order            Diet NPO time specified  Diet effective now                 EDUCATION NEEDS:   No education needs have been identified at this time  Skin:  Skin Assessment: Reviewed RN Assessment  Last BM:  6/5 large  Height:   Ht Readings from Last 1 Encounters:  No data found for Ht    Weight:   Wt Readings from Last 1 Encounters:  05/18/21 77.7 kg    Ideal Body Weight:     BMI:  There is no height or weight on file to calculate BMI.  Estimated Nutritional Needs:   Kcal:  1800-2000  Protein:  90-105 grams  Fluid:  >1.8 L/day  Lockie Pares., RD, LDN, CNSC See AMiON for contact information

## 2021-05-19 NOTE — Progress Notes (Signed)
NAMEKhyra Viscuso, MRN:  [id number], DOB:  [birth year], LOS: 5 ADMISSION DATE:  05/13/2021, CONSULTATION DATE:  05/14/2021 REFERRING MD:  Stroke team CHIEF COMPLAINT: Intracranial Hemorrhage  HPI/course in hospital  60 year old female with PMHx significant for HTN, HLD and thyroid disease [facility] from [facility]) for management of acute ICH.    Per notes/records from [place], EMS was called to the patient's home and on arrival the patient was found to be slumped over in a chair. Sheprojectilevomited and thenwas noted to beflaccid on the right with right facial droop, was very somnolent and unable to answer questions coherently. On arrival to the ED, BP was 222/128. CT Head revealed 3cm L thalamic hemorrhage with intraventricular extension.  Initial NIHSS was 25. Initial exam by EDP at OSH: Patient awake with slow but comprehensible speech. Oriented to person. Confused. CN were normal. LUE and LLE 5/5, RUE and RLE with flaccid paralysis. Follow up NIHSS was 17.   Past Medical History  HTN, HLD and thyroid dysfunction (unclear if hypothyroid or hyperthyroid)  Imaging:  Carotid dopplers 6/3> 1-39% stenosis   6/2 CT H> L thalamic ICH with ventricular extension  6/3 CT H> Interval placement of R frontal EVD  6/2 ECHO> LVEH 50-55% mild LVH. Grade II diastolic dysfunction.  6/6 CT H > Stable hemorrhage, mass effect, EVD. Less blood in 3rd and 4th ventricles.   Interim History/Subjective:   Hypertensive overnight. Back on clevidipine 21. Green vaginal discharge per RN.   Objective   Blood pressure (!) 159/81, pulse 85, temperature 99.5 F (37.5 C), temperature source Oral, resp. rate 17, weight 77.7 kg, SpO2 99 %.        Intake/Output Summary (Last 24 hours) at 05/19/2021 0734 Last data filed at 05/19/2021 0700 Gross per 24 hour  Intake 1786.24 ml  Output 1259 ml  Net 527.24 ml   Filed Weights   05/13/21 2256 05/17/21 0400  05/18/21 0500  Weight: 75.3 kg 80.9 kg 77.7 kg   Physical Examination: General:  Middle aged female in NAD Neuro:  Alert to verbal. No verbal response. L gaze preference. Moves L spontaneously, but does not follow commands. Withdraws R lower from pain. No movement R upper.  HEENT:  [place]/AT, No JVD noted. EVD in place. Cardiovascular:  RRR, no MRG Lungs:  Clear bilateral breath sounds Abdomen:  Soft, non-distended, non-tender.  Musculoskeletal:  No acute deformity Skin:  Intact, MMM GU: green/yellow purulent vaginal discharge.    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 S/p EVD with NSGY.  - Per stroke - EVD management per NSGY > drain to 20cm H2O - Continue Keppra for seizure ppx - Cleviprex for goal SBP < 160 - Neuroprotective measures: HOB > 30 degrees, normoglycemia, normothermia, electrolytes WNL - CIR consult pending  HTN Hypertensive emergency with resultant ICH. Longstanding history of HTN. - Continue Lisinopril, Coreg, HCTZ. - Incease coreg. Add amlodipine - Wean Cleviprex for SBP < 140  Dysphagia - Continue TFs via Cortrak - Ongoing SLP follow-up, appreciate assistance - CIR consult/evaluation pending  HLD - Consider statin at d/c  Vaginal discharge - Will send for GC, chlamydia, trichomonas, KOH, wet prep, fungal - Treat depending on result.     Best Practice (evaluated daily):  Diet:  Tube Feed  Pain/Anxiety/Delirium protocol (if indicated): Yes (RASS goal 0)  VAP protocol (if indicated): Not indicated DVT prophylaxis: Contraindicated  - ICH GI prophylaxis: PPI Glucose control:  SSI No Central venous access:  N/A Arterial line:  N/A Foley:  N/A Mobility:  OOB  PT consulted: Yes - CIR consult pending Last date of multidisciplinary goals of care discussion [6/5] Code Status:  full code Disposition:  ICU - possible dispo to CIR, consult pending  Critical care time: 40 minutes    Joneen Roach, AGACNP-BC [facility]  See Amion for personal pager PCCM on call pager [phone] until 7pm. Please call Elink 7p-7a. [phone]  05/19/2021 8:07 AM

## 2021-05-19 NOTE — Progress Notes (Signed)
SLP Cancellation Note  Patient Details Name: Marissa Morris MRN: [id number] DOB: [birth year]   Cancelled treatment:         Arrived to perform FEES. Pt was more awake during this morning's session. Given repeated hard trap squeeze and verbal stimulation she would open eyes briefly but could not maintain. Decision made to defer until level of alertness improves.   Royce Macadamia 05/19/2021, 1:49 PM   Breck Coons Lonell Face.Ed Nurse, children's [phone] Office [phone]

## 2021-05-19 NOTE — Progress Notes (Signed)
STROKE TEAM PROGRESS NOTE   SUBJECTIVE (INTERVAL HISTORY) Lethargic earlier today as per RN but during my exam is working with therapists and is alert, and following commands. EVD draining well, and neurosurgery has resolved for 4-20 today.  Pt neurologically unchanged, stable. Still has left gaze preference and right hemiparesis.  Blood pressure adequately controlled.   OBJECTIVE Temp:  [98.8 F (37.1 C)-99.6 F (37.6 C)] 99.1 F (37.3 C) (06/07 1200) Pulse Rate:  [58-93] 91 (06/07 1600) Cardiac Rhythm: Normal sinus rhythm (06/07 0800) Resp:  [16-33] 20 (06/07 1600) BP: (115-177)/(65-142) 148/87 (06/07 1600) SpO2:  [89 %-100 %] 95 % (06/07 1600)  Recent Labs  Lab 05/18/21 2007 05/18/21 2339 05/19/21 0359 05/19/21 0725 05/19/21 1209  GLUCAP 130* 145* 136* 125* 149*   Recent Labs  Lab 05/15/21 1340 05/15/21 1735 05/16/21 0605 05/16/21 1659 05/17/21 0318 05/18/21 0444 05/19/21 0347  NA 136  --  136  --  139 138 137  K 3.7  --  3.8  --  3.7 3.7 4.0  CL 104  --  101  --  101 100 99  CO2 22  --  24  --  GLUCOSE 93  --  163*  --  119* 105* 148*  BUN 10  --  12  --  21* 25* 26*  CREATININE 0.64  --  0.74  --  0.92 0.82 0.81  CALCIUM 8.2*  --  8.3*  --  8.6* 8.4* 8.6*  MG 2.1 2.3 2.5* 2.4  --   --   --   PHOS 3.8 3.4 3.6 4.3  --   --   --    Recent Labs  Lab 05/14/21 0119  AST 33  ALT 23  ALKPHOS 84  BILITOT 0.7  PROT 7.6  ALBUMIN 4.1   Recent Labs  Lab 05/14/21 0119 05/16/21 0605 05/17/21 0318 05/18/21 0444 05/19/21 0347  WBC 12.8* 12.5* 12.9* 10.9* 11.0*  HGB 13.6 15.3* 15.0 14.1 14.1  HCT 42.9 44.5 44.8 43.7 43.6  MCV 99.8 94.9 95.5 98.9 97.5  PLT 192 186 201 184 201   No results for input(s): CKTOTAL, CKMB, CKMBINDEX, TROPONINI in the last 168 hours. No results for input(s): LABPROT, INR in the last 72 hours. Recent Labs    05/17/21 1123  COLORURINE YELLOW  LABSPEC 1.021  PHURINE 5.0  GLUCOSEU NEGATIVE  HGBUR NEGATIVE  BILIRUBINUR  NEGATIVE  KETONESUR NEGATIVE  PROTEINUR NEGATIVE  NITRITE NEGATIVE  LEUKOCYTESUR NEGATIVE       Component Value Date/Time   CHOL 203 (H) 05/14/2021 0022   TRIG 71 05/14/2021 0022   HDL 68 05/14/2021 0022   CHOLHDL 3.0 05/14/2021 0022   VLDL 14 05/14/2021 0022   LDLCALC 121 (H) 05/14/2021 0022   Lab Results  Component Value Date   HGBA1C 5.7 (H) 05/14/2021      Component Value Date/Time   LABOPIA NONE DETECTED 05/14/2021 0254   COCAINSCRNUR NONE DETECTED 05/14/2021 0254   LABBENZ NONE DETECTED 05/14/2021 0254   AMPHETMU NONE DETECTED 05/14/2021 0254   THCU NONE DETECTED 05/14/2021 0254   LABBARB NONE DETECTED 05/14/2021 0254    No results for input(s): ETH in the last 168 hours.  I have personally reviewed the radiological images below and agree with the radiology interpretations.  DG Abd 1 View  Result Date: 05/14/2021 CLINICAL DATA:  60 year old female with intracranial hemorrhage. EXAM: ABDOMEN - 1 VIEW COMPARISON:  Portable pelvis radiograph today reported separately. FINDINGS: Portable AP supine view  at 0344 hours. Non obstructed bowel gas pattern. No definite pneumoperitoneum on this supine view. Minimally included left lung base appears grossly negative. Scoliosis and degenerative changes in the spine. Pelvic dystrophic and/or vascular calcifications, mild. No acute osseous abnormality identified. IMPRESSION: 1. Normal bowel gas pattern. 2. Spinal scoliosis and degeneration. Electronically Signed   By: Odessa Fleming M.D.   On: 05/14/2021 06:19   CT HEAD WO CONTRAST  Result Date: 05/18/2021 CLINICAL DATA:  60 year old female with left thalamic hemorrhage, IVH, EVD. EXAM: CT HEAD WITHOUT CONTRAST TECHNIQUE: Contiguous axial images were obtained from the base of the skull through the vertex without intravenous contrast. COMPARISON:  Head CT 05/15/2021 and earlier. FINDINGS: Brain: Right superior frontal approach EVD with catheter terminating at the 3rd ventricle and communicating  with the right lateral ventricle on coronal image 35. The 3rd and 4th ventricles remain relatively decompressed. There is less 3rd and 4th ventricular IVH. Lateral ventricle size also mildly decreased since 05/15/2021, although with moderate residual lateral ventricular blood greater on the left. Oval left thalamic intra-axial hemorrhage encompasses 34 x 22 by 33 mm now (AP by transverse by CC) for an estimated blood volume of 12 mL, and has not changed since 05/15/2021. Regional edema and mild regional mass effect are stable. Stable gray-white matter differentiation elsewhere with Patchy and confluent white matter hypodensity. No new cortically based infarct identified. Basilar cisterns are patent. No significant midline shift. Vascular: Calcified atherosclerosis at the skull base. Skull: Stable right superior burr hole. Sinuses/Orbits: Visualized paranasal sinuses and mastoids are stable and well aerated. Other: Right nasoenteric tube in place. Mild postoperative changes to the scalp vertex. Disconjugate gaze but otherwise negative orbits soft tissues. IMPRESSION: 1. Stable left thalamic intra-axial hemorrhage size since 05/15/2021. Stable regional edema and mild regional mass effect. 2. Stable right frontal approach EVD with slightly decreased lateral ventricle size since 05/15/2021. Moderate lateral ventricle IVH persists. There is less blood in the small 3rd and 4th ventricles now. 3. Underlying chronic small vessel disease. No new intracranial abnormality. Electronically Signed   By: Odessa Fleming M.D.   On: 05/18/2021 06:31   CT HEAD WO CONTRAST  Result Date: 05/15/2021 CLINICAL DATA:  Cerebral hemorrhage suspected. EXAM: CT HEAD WITHOUT CONTRAST TECHNIQUE: Contiguous axial images were obtained from the base of the skull through the vertex without intravenous contrast. COMPARISON:  MRI and CT of the brain May 14, 2021. FINDINGS: Brain: No significant interval change size of the left thalamic hemorrhage with  hematoma now measuring approximately 3.5 x 3.3 x 2 cm compared to 3.3 x 3.3 x 2.2 cm on prior. Persistent hypodensity surrounding the hematoma related to vasogenic edema. Supratentorial and infratentorial intraventricular hemorrhage related to hematoma decompression into the right lateral ventricle, status post right frontal approach ventricular drain placement. Drain tip is at level of the right foramen of Monro. There is mild interval decrease in size of the supratentorial ventricles, particularly the temporal horns. Patchy hypodensity of the white matter of the cerebral hemispheres, nonspecific most likely related to small vessel ischemia. Remote lacunar infarct in the right radiata. Vascular: No hyperdense vessel or unexpected calcification. Skull: Negative for fracture or focal lesion. Sinuses/Orbits: No acute finding. Other: None. IMPRESSION: 1. Interval placement of a right frontal approach ventricular drain with the tip at the level of the right foramen of Monro. Interval mild decrease in size of the ventricular system, particularly the temporal horns of the lateral ventricles. 2. Stable appearance of left thalamic intraparenchymal hemorrhage with intraventricular extension.  Electronically Signed   By: Baldemar Lenis M.D.   On: 05/15/2021 09:41   CT HEAD WO CONTRAST  Result Date: 05/14/2021 CLINICAL DATA:  Intraparenchymal hemorrhage.  Follow-up. EXAM: CT HEAD WITHOUT CONTRAST TECHNIQUE: Contiguous axial images were obtained from the base of the skull through the vertex without intravenous contrast. COMPARISON:  Same day CT head. FINDINGS: Mildly motion limited exam.  Within this limitation: Brain: When accounting for differences in technique, similar size of the acute intraparenchymal hemorrhage centered in the left thalamus which measures up to 3.3 x 2.2 x 3.3 cm (similar when remeasured on the prior). Similar regional mass effect. Similar intraventricular hemorrhage within the left  greater than right lateral ventricles, third ventricle, and fourth ventricle. Similar ventriculomegaly with mild periventricular edema and prominence of the temporal horns. Additional scattered white matter hypoattenuation is nonspecific but most likely related to chronic microvascular ischemic disease. No evidence of acute large vascular territory infarct. Similar suspected prior infarct in the right corona radiata. Vascular: No hyperdense vessel identified. Calcific atherosclerosis. Skull: No acute fracture. Sinuses/Orbits: No acute findings. Other: No mastoid effusions. IMPRESSION: When accounting for differences in technique, similar size of the acute left thalamic intraparenchymal hemorrhage. Similar intraventricular extension of hemorrhage and ventriculomegaly. Electronically Signed   By: Feliberto Harts MD   On: 05/14/2021 06:21   CT HEAD WO CONTRAST  Result Date: 05/14/2021 CLINICAL DATA:  Intraparenchymal hemorrhage. Follow-up of scans performed at [place], [place] [facility]. EXAM: CT HEAD WITHOUT CONTRAST TECHNIQUE: Contiguous axial images were obtained from the base of the skull through the vertex without intravenous contrast. COMPARISON:  None. I am not able to view the outside institution scans. FINDINGS: Brain: Intraparenchymal hematoma centered in the left thalamus measures 3.2 x 2.2 x 3.0 cm (volume = 11 cm^3). There is moderate hydrocephalus with periventricular hypoattenuation suggesting transependymal interstitial edema. There is rightward bulging of the septum pellucidum. Large amount of blood in the ventricles. Vascular: No abnormal hyperdensity of the major intracranial arteries or dural venous sinuses. No intracranial atherosclerosis. Skull: The visualized skull base, calvarium and extracranial soft tissues are normal. Sinuses/Orbits: No fluid levels or advanced mucosal thickening of the visualized paranasal sinuses. No mastoid or middle ear effusion. The orbits are normal. IMPRESSION: 1.  Intraparenchymal hematoma centered in the left thalamus with intraventricular extension and moderate hydrocephalus with transependymal interstitial edema. Critical Value/emergent results were called by telephone at the time of interpretation on 05/14/2021 at 12:58 am to provider ERIC [facility] , who verbally acknowledged these results. Electronically Signed   By: Deatra Robinson M.D.   On: 05/14/2021 00:58   MR MRA HEAD WO CONTRAST  Result Date: 05/14/2021 CLINICAL DATA:  Intracranial hemorrhage EXAM: MRI HEAD WITHOUT CONTRAST MRA HEAD WITHOUT CONTRAST TECHNIQUE: Multiplanar, multi-echo pulse sequences of the brain and surrounding structures were acquired without intravenous contrast. Angiographic images of the Circle of Willis were acquired using MRA technique without intravenous contrast. COMPARISON: No pertinent prior exam. COMPARISON:  Correlation made with prior head CT FINDINGS: MRI HEAD Brain: Left thalamic hemorrhage is again identified similar to the recent CT with acute blood products. There is mild surrounding edema. Significant intraventricular extension is present as seen on CT with resulting hydrocephalus. Focus susceptibility in the right corona radiata reflects chronic microhemorrhage associated with a lacunar infarct. Additional patchy and confluent areas of T2 hyperintensity in the supratentorial white matter are nonspecific but probably reflect moderate chronic microvascular ischemic changes. No post contrast imaging performed therefore potential underlying lesion is not well  evaluated. Vascular: Major vessel flow voids at the skull base are preserved. Skull and upper cervical spine: Normal marrow signal is preserved. Sinuses/Orbits: Paranasal sinuses are aerated. Orbits are unremarkable. Other: Sella is unremarkable.  Mastoid air cells are clear. MRA HEAD Intracranial internal carotid arteries are patent. Middle and anterior cerebral arteries are patent. Intracranial vertebral arteries, basilar  artery, posterior cerebral arteries are patent. There is no significant stenosis or aneurysm. No abnormal flow related enhancement in the region of thalamic hemorrhage. IMPRESSION: No substantial change in left thalamic hemorrhage with significant intraventricular extension and hydrocephalus as well as mild edema and mass effect compared to recent CT. 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 Moderate chronic microvascular ischemic changes. Unremarkable MRA. Electronically Signed   By: Guadlupe Spanish M.D.   On: 05/14/2021 11:18   MR BRAIN WO CONTRAST  Result Date: 05/14/2021 CLINICAL DATA:  Intracranial hemorrhage EXAM: MRI HEAD WITHOUT CONTRAST MRA HEAD WITHOUT CONTRAST TECHNIQUE: Multiplanar, multi-echo pulse sequences of the brain and surrounding structures were acquired without intravenous contrast. Angiographic images of the Circle of Willis were acquired using MRA technique without intravenous contrast. COMPARISON: No pertinent prior exam. COMPARISON:  Correlation made with prior head CT FINDINGS: MRI HEAD Brain: Left thalamic hemorrhage is again identified similar to the recent CT with acute blood products. There is mild surrounding edema. Significant intraventricular extension is present as seen on CT with resulting hydrocephalus. Focus susceptibility in the right corona radiata reflects chronic microhemorrhage associated with a lacunar infarct. Additional patchy and confluent areas of T2 hyperintensity in the supratentorial white matter are nonspecific but probably reflect moderate chronic microvascular ischemic changes. No post contrast imaging performed therefore potential underlying lesion is not well evaluated. Vascular: Major vessel flow voids at the skull base are preserved. Skull and upper cervical spine: Normal marrow signal is preserved. Sinuses/Orbits: Paranasal sinuses are aerated. Orbits are unremarkable. Other: Sella is unremarkable.  Mastoid air  cells are clear. MRA HEAD Intracranial internal carotid arteries are patent. Middle and anterior cerebral arteries are patent. Intracranial vertebral arteries, basilar artery, posterior cerebral arteries are patent. There is no significant stenosis or aneurysm. No abnormal flow related enhancement in the region of thalamic hemorrhage. IMPRESSION: No substantial change in left thalamic hemorrhage with significant intraventricular extension and hydrocephalus as well as mild edema and mass effect compared to recent CT. 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 Moderate chronic microvascular ischemic changes. Unremarkable MRA. Electronically Signed   By: Guadlupe Spanish M.D.   On: 05/14/2021 11:18   DG Pelvis Portable  Result Date: 05/14/2021 CLINICAL DATA:  60 year old female with intracranial hemorrhage. EXAM: PORTABLE PELVIS 1-2 VIEWS COMPARISON:  None. FINDINGS: Portable AP view at 0346 hours. Mild likely fibroid associated dystrophic calcifications in the pelvis eccentric to the right. Femoral heads normally located. Pelvis and proximal femurs appear intact. No acute osseous abnormality identified. Negative visible bowel gas pattern. IMPRESSION: Negative. Electronically Signed   By: Odessa Fleming M.D.   On: 05/14/2021 06:18   DG CHEST PORT 1 VIEW  Result Date: 05/17/2021 CLINICAL DATA:  Respiratory failure EXAM: PORTABLE CHEST 1 VIEW COMPARISON:  May 14, 2021 FINDINGS: Feeding tube tip is below the diaphragm. No pneumothorax. There is no edema or airspace opacity. There is cardiomegaly with pulmonary vascularity normal. No adenopathy. No bone lesions. IMPRESSION: No edema or airspace opacity. Cardiomegaly, stable. Feeding tube tip below diaphragm. Electronically Signed   By: Bretta Bang III  M.D.   On: 05/17/2021 14:17   DG CHEST PORT 1 VIEW  Result Date: 05/14/2021 CLINICAL DATA:  Provided history: Follow-up. EXAM: PORTABLE CHEST 1 VIEW COMPARISON:  None.  FINDINGS: Cardiomediastinal silhouette is enlarged, likely accentuated by AP technique. Calcific atherosclerosis of the aorta. Low lung volumes. No consolidation. EKG leads bilaterally. No visible pleural effusions or pneumothorax on this single semi erect AP radiograph. IMPRESSION: 1. Low lung volumes without evidence of acute cardiopulmonary disease. 2. Cardiomegaly. Electronically Signed   By: Feliberto Harts MD   On: 05/14/2021 06:13   DG Abd Portable 1V  Result Date: 05/15/2021 CLINICAL DATA:  Feeding tube placement. EXAM: PORTABLE ABDOMEN - 1 VIEW COMPARISON:  None. FINDINGS: Feeding tube is in place with the tip in the distal stomach directed toward the duodenum. IMPRESSION: As above. Electronically Signed   By: Drusilla Kanner M.D.   On: 05/15/2021 11:40   ECHOCARDIOGRAM COMPLETE  Result Date: 05/16/2021    ECHOCARDIOGRAM REPORT   Patient Name:   MAJORIE SANTEE Date of Exam: 05/14/2021 Medical Rec #:  [id number]    Height: Accession #:    [id number]   Weight:       166.0 lb Date of Birth:  03/11/1961    BSA:          1.783 m Patient Age:    59 years     BP:           122/69 mmHg Patient Gender: F            HR:           77 bpm. Exam Location:  Inpatient Procedure: 2D Echo, Cardiac Doppler and Color Doppler Indications:    Stroke  History:        Patient has no prior history of Echocardiogram examinations.  Sonographer:    Ross Ludwig RDCS (AE) Referring Phys: [phone] ERIC LINDZEN  Sonographer Comments: Patient not responsive to request to sniff for IVC collapse test. IMPRESSIONS  1. Left ventricular ejection fraction, by estimation, is 50 to 55%. The left ventricle has low normal function. The left ventricle has no regional wall motion abnormalities. There is moderate thickening of the septum. The rest of the LV segments demonstrate mild left ventricular hypertrophy. Left ventricular diastolic parameters are consistent with Grade II diastolic dysfunction (pseudonormalization). Elevated left ventricular  end-diastolic pressure.  2. The average left ventricular global longitudinal strain is -6.7 %. The global longitudinal strain is abnormal. Strain pattern shows relative apical sparring which may be suggestive of amyloid in the right clinic context.  3. Right ventricular systolic function is normal. The right ventricular size is normal.  4. The mitral valve is normal in structure. Trivial mitral valve regurgitation. No evidence of mitral stenosis.  5. The aortic valve is tricuspid. There is mild calcification of the aortic valve. There is mild thickening of the aortic valve. Aortic valve regurgitation is not visualized. Mild aortic valve sclerosis is present, with no evidence of aortic valve stenosis.  6. The inferior vena cava is dilated in size with <50% respiratory variability, suggesting right atrial pressure of 15 mmHg.  7. Left atrial size was mildly dilated. Comparison(s): No prior Echocardiogram. Conclusion(s)/Recommendation(s): No intracardiac source of embolism detected on this transthoracic study. A transesophageal echocardiogram is recommended to exclude cardiac source of embolism if clinically indicated. FINDINGS  Left Ventricle: Left ventricular ejection fraction, by estimation, is 50 to 55%. The left ventricle has low normal function. The left ventricle has no regional wall motion abnormalities.  The average left ventricular global longitudinal strain is -6.7 %.  The global longitudinal strain is abnormal. The left ventricular internal cavity size was normal in size. There is moderate thickening of the septum. The rest of the LV segments demonstrate mild left ventricular hypertrophy. Left ventricular diastolic parameters are consistent with Grade II diastolic dysfunction (pseudonormalization). Elevated left ventricular end-diastolic pressure. Right Ventricle: The right ventricular size is normal. No increase in right ventricular wall thickness. Right ventricular systolic function is normal. Left Atrium:  Left atrial size was mildly dilated. Right Atrium: Right atrial size was normal in size. Pericardium: There is no evidence of pericardial effusion. Mitral Valve: The mitral valve is normal in structure. There is mild thickening of the mitral valve leaflet(s). There is mild calcification of the mitral valve leaflet(s). Mild mitral annular calcification. Trivial mitral valve regurgitation. No evidence  of mitral valve stenosis. MV peak gradient, 7.1 mmHg. The mean mitral valve gradient is 4.0 mmHg. Tricuspid Valve: The tricuspid valve is normal in structure. Tricuspid valve regurgitation is trivial. No evidence of tricuspid stenosis. Aortic Valve: The aortic valve is tricuspid. There is mild calcification of the aortic valve. There is mild thickening of the aortic valve. Aortic valve regurgitation is not visualized. Mild aortic valve sclerosis is present, with no evidence of aortic valve stenosis. Aortic valve mean gradient measures 8.0 mmHg. Aortic valve peak gradient measures 16.3 mmHg. Aortic valve area, by VTI measures 2.05 cm. Pulmonic Valve: The pulmonic valve was normal in structure. Pulmonic valve regurgitation is trivial. No evidence of pulmonic stenosis. Aorta: The aortic root and ascending aorta are structurally normal, with no evidence of dilitation. Venous: The inferior vena cava is dilated in size with less than 50% respiratory variability, suggesting right atrial pressure of 15 mmHg. IAS/Shunts: No atrial level shunt detected by color flow Doppler.  LEFT VENTRICLE PLAX 2D LVIDd:         4.70 cm  Diastology LVIDs:         4.20 cm  LV e' medial:    5.55 cm/s LV PW:         1.60 cm  LV E/e' medial:  23.2 LV IVS:        1.80 cm  LV e' lateral:   6.31 cm/s LVOT diam:     1.80 cm  LV E/e' lateral: 20.4 LV SV:         69 LV SV Index:   39       2D Longitudinal Strain LVOT Area:     2.54 cm 2D Strain GLS Avg:     -6.7 %  RIGHT VENTRICLE             IVC RV Basal diam:  3.50 cm     IVC diam: 2.30 cm RV S prime:      13.20 cm/s TAPSE (M-mode): 3.2 cm LEFT ATRIUM             Index       RIGHT ATRIUM           Index LA diam:        4.10 cm 2.30 cm/m  RA Area:     16.30 cm LA Vol (A2C):   42.1 ml 23.61 ml/m RA Volume:   45.00 ml  25.23 ml/m LA Vol (A4C):   43.6 ml 24.45 ml/m LA Biplane Vol: 47.3 ml 26.52 ml/m  AORTIC VALVE AV Area (Vmax):    1.86 cm AV Area (Vmean):   2.09 cm AV Area (VTI):  2.05 cm AV Vmax:           202.00 cm/s AV Vmean:          135.000 cm/s AV VTI:            0.336 m AV Peak Grad:      16.3 mmHg AV Mean Grad:      8.0 mmHg LVOT Vmax:         148.00 cm/s LVOT Vmean:        111.000 cm/s LVOT VTI:          0.271 m LVOT/AV VTI ratio: 0.81  AORTA Ao Root diam: 2.90 cm Ao Asc diam:  3.20 cm MITRAL VALVE MV Area (PHT): 3.08 cm     SHUNTS MV Area VTI:   1.83 cm     Systemic VTI:  0.27 m MV Peak grad:  7.1 mmHg     Systemic Diam: 1.80 cm MV Mean grad:  4.0 mmHg MV Vmax:       1.33 m/s MV Vmean:      90.6 cm/s MV Decel Time: 246 msec MV E velocity: 129.00 cm/s MV A velocity: 118.00 cm/s MV E/A ratio:  1.09 Laurance Flatten MD Electronically signed by Laurance Flatten MD Signature Date/Time: 05/16/2021/7:51:23 AM    Final    VAS US CAROTID  Result Date: 05/15/2021 Carotid Arterial Duplex Study Patient Name:  SHEILA OCASIO  Date of Exam:   05/14/2021 Medical Rec #: [id number]     Accession #:    [id number] Date of Birth: May 02, 1961     Patient Gender: F Patient Age:   43Y Exam Location:  [facility] Procedure:      VAS US CAROTID Referring Phys: [id number] Marvel Plan --------------------------------------------------------------------------------  Indications:       Hypertensive emergency (122/128). 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 Risk Factors:      Hypertension. Comparison Study:  No prior study on file Performing Technologist: Sherren Kerns RVS  Examination Guidelines: A complete evaluation includes B-mode imaging, spectral Doppler, color Doppler,  and power Doppler as needed of all accessible portions of each vessel. Bilateral testing is considered an integral part of a complete examination. Limited examinations for reoccurring indications may be performed as noted.  Right Carotid Findings: +----------+--------+--------+--------+------------------+--------+           PSV cm/sEDV cm/sStenosisPlaque DescriptionComments +----------+--------+--------+--------+------------------+--------+ CCA Prox  96      9                                          +----------+--------+--------+--------+------------------+--------+ CCA Distal104     13                                         +----------+--------+--------+--------+------------------+--------+ ICA Prox  66      15      1-39%   heterogenous               +----------+--------+--------+--------+------------------+--------+ ICA Distal54      13                                         +----------+--------+--------+--------+------------------+--------+  ECA       151     22                                         +----------+--------+--------+--------+------------------+--------+ +----------+--------+-------+--------+-------------------+           PSV cm/sEDV cmsDescribeArm Pressure (mmHG) +----------+--------+-------+--------+-------------------+ Subclavian150                                        +----------+--------+-------+--------+-------------------+ +---------+--------+--+--------+--+---------+ VertebralPSV cm/s41EDV cm/s12Antegrade +---------+--------+--+--------+--+---------+  Left Carotid Findings: +----------+--------+--------+--------+------------------+------------------+           PSV cm/sEDV cm/sStenosisPlaque DescriptionComments           +----------+--------+--------+--------+------------------+------------------+ CCA Prox  180     15                                                    +----------+--------+--------+--------+------------------+------------------+ CCA Distal98      13                                intimal thickening +----------+--------+--------+--------+------------------+------------------+ ICA Prox  70      22      1-39%                     intimal thickening +----------+--------+--------+--------+------------------+------------------+ ICA Distal68      22                                                   +----------+--------+--------+--------+------------------+------------------+ ECA       91      18                                                   +----------+--------+--------+--------+------------------+------------------+ +----------+--------+--------+--------+-------------------+           PSV cm/sEDV cm/sDescribeArm Pressure (mmHG) +----------+--------+--------+--------+-------------------+ [id number]                                          +----------+--------+--------+--------+-------------------+ +---------+--------+--+--------+--+---------+ VertebralPSV cm/s61EDV cm/s13Antegrade +---------+--------+--+--------+--+---------+   Summary: Right Carotid: Velocities in the right ICA are consistent with a 1-39% stenosis. Left Carotid: Velocities in the left ICA are consistent with a 1-39% stenosis. Vertebrals: Bilateral vertebral arteries demonstrate antegrade flow. *See table(s) above for measurements and observations.  Electronically signed by Marvel Plan MD on 05/15/2021 at 2:54:40 PM.    Final      PHYSICAL EXAM  Temp:  [98.8 F (37.1 C)-99.6 F (37.6 C)] 99.1 F (37.3 C) (06/07 1200) Pulse Rate:  [58-93] 91 (06/07 1600) Resp:  [16-33] 20 (06/07 1600) BP: (115-177)/(65-142) 148/87 (06/07 1600) SpO2:  [89 %-100 %] 95 % (06/07 1600)  General -middle-aged African-American lady, no acute distress Cardiovascular - Regular rhythm and  rate. Neuro - drowsy, eyes open, awake,  Attends briefly, but has to be reminded  to stay awake during exam. follow some simple commands, ft gaze preference and barely cross midline, not blinking to visual threat consistently, PERRL. Right facial droop. Tongue protrusion not cooperative. Spontaneously moving LUE against gravity, no drift, flaccid on the RUE however with pain, bicep 3-/5. Withdraw to pain LLE 3/5, but only mild withdraw 2/5 at RLE. Sensation, coordination not cooperative and gait not tested.  ASSESSMENT/PLAN Ms. Marissa Morris is a 60 y.o. female with no significant history admitted for N/V, right sided weakness and facial droop. No tPA given due to ICH.    ICH with IVH:  left thalamic ICH with extensive IVH -  secondary to hypertensive emergency  CT head left thalamic ICH with IVH  Repeat CT head stable ICH and ventricles  MRI  Stable ICH and ventricles  MRA  Unremarkable.   Carotid Doppler  <50% BL  2D Echo  Normal EF 50-55%, left ventricular global longitudinal strain is  apical sparring which may be suggestive of amyloid in the right clinic  context.    LDL 121  HgbA1c 5.7  SCDs for VTE prophylaxis, can start chemical prophylaxis when drain is off  No antithrombotic prior to admission, now on No antithrombotic due to ICH  Ongoing aggressive stroke risk factor management  Therapy recommendations:  CIR  Disposition:  Pending   Hydrocephalus   Slightly enlarged ventricles   Repeat CT and MRI showed stable ventricle size  S/p EVD yesterday, draining well 10cc/h  Repeat CT 6/3 mildly decreased ventricle size  On keppra 500mg  bid for seizure prophylaxis   Close monitoring  Hypertensive emergency  . HCTZ 13 mg daily . Lisinoril 20 mg daily  . Carvedilol 12.5 bid . On labetalol PRN . Maintain SBP <160 for now, continue to titrate Clevidipine   Long term BP goal normotensive  Hyperlipidemia  Home meds:  none   LDL 121, goal < 70  Consider statin at discharge  Dysphagia   Did not pass swallow  NPO now  Cortrak     Other Active Problems Fever overnight with increasing WBC> No clear sign of infection, ventriculostomy site looks good, no cough, no cough, no dysuria;  check urinalysis, check CXR, monitor drain site   ICU team following for rest of fever work up  Hospital day # 5  Patient is doing well and blood pressure is adequately controlled.  Continue ventriculostomy and is draining well and Popoff has been raised to 20 cm today as per neurosurgery..  Continue ongoing therapy consults.  She has low-grade fever and increasing white count.  We will follow testing to rule out infection.  Discussed with Dr. Merrily Pew critical care medicine.  Discussed with her family at the bedside This patient is critically ill and at significant risk of neurological worsening, death and care requires constant monitoring of vital signs, hemodynamics,respiratory and cardiac monitoring, extensive review of multiple databases, frequent neurological assessment, discussion with family, other specialists and medical decision making of high complexity.I have made any additions or clarifications directly to the above note.This critical care time does not reflect procedure time, or teaching time or supervisory time of PA/NP/Med Resident etc but could involve care discussion time.  I spent 30 minutes of neurocritical care time  in the care of  this patient.     Delia Heady, MD Medical Director [facility] Pager: [phone] 05/19/2021 5:49 PM  To contact Stroke Continuity provider,  please refer to [email or web address] After hours, contact General Neurology

## 2021-05-19 NOTE — Progress Notes (Signed)
  Speech Language Pathology Treatment: Dysphagia;Cognitive-Linquistic  Patient Details Name: Marissa Morris MRN: [id number] DOB: 05/07/1961 Today's Date: 05/19/2021 Time: 3475-8307 SLP Time Calculation (min) (ACUTE ONLY): 17 min  Assessment / Plan / Recommendation Clinical Impression  SLP scheduled pt for a FEES (fiberoptic endoscopic evaluation of swallow) at 1:30. Pt had some blood pressure issues this am and is typically not as alert in the morning. Therapist met pt's sister and brother-in-law and reviewed evaluations (language, swallow) with them and Marissa Morris. Also gauging her participation level for FEES this afternoon. She awakened with repositioning recliner to upright position and tactile/verbal cues. Following oral care straw sips water accepted with slight change in respiratory pattern. Did not have pt consume significant amount during session. Explained procedure and received informed consent with sister and witness Marissa Morris, Pension scheme manager).    HPI HPI: Marissa Morris is a 60 y.o. female presenting to [facility] in transfer from [facility] for management of acute intracranial hemorrhage. 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 Moderate chronic microvascular ischemic changes. No PMH on file.      SLP Plan  New goals to be determined pending instrumental study       Recommendations  Diet recommendations: NPO Medication Administration: Via alternative means                General recommendations: Rehab consult Oral Care Recommendations: Oral care QID Follow up Recommendations: Inpatient Rehab SLP Visit Diagnosis: Dysphagia, unspecified (R13.10) Plan: New goals to be determined pending instrumental study                        Houston Siren 05/19/2021, 11:04 AM Orbie Pyo Colvin Caroli.Ed Risk analyst [phone] Office [phone]

## 2021-05-19 NOTE — Progress Notes (Signed)
Occupational Therapy Treatment Patient Details Name: Marissa Morris MRN: [id number] DOB: [birth year] Today's Date: 05/19/2021    History of present illness Marissa Morris is a 60 y.o. female presenting to [facility] in transfer from [facility] for management of acute intracranial hemorrhage. 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 6/3 interval placement of R frontal EVD. No significant PMH.   OT comments  Pt continues to progress to OOB mobility and some light ADL. Pt washing face today. Pt's R side flaccid and 50% of command following and responses with nods/shaking head. Pt turning head to left and cues to look to R, but difficulty performing tasks to R side. Pt requiring max cues to perform tasks on L side, but following increased commands and more alert with more time awake. Pt sat EOB x10 mins prior to sitting in recliner, but fatigues quickly after working on dynamic sitting balance tasks in order to find center. Pt would benefit from continued OT skilled services. OT following acutely. O2>90% on RA.   Follow Up Recommendations  CIR    Equipment Recommendations  3 in 1 bedside commode    Recommendations for Other Services Rehab consult    Precautions / Restrictions Precautions Precautions: Fall;Other (comment) Precaution Comments: EVD, posey belt, NGT Restrictions Weight Bearing Restrictions: No       Mobility Bed Mobility Overal bed mobility: Needs Assistance Bed Mobility: Rolling;Sidelying to Sit Rolling: Min assist   Supine to sit: +2 for physical assistance;Max assist     General bed mobility comments: MinA to roll towards right, pt initiating well, maxA + 2 for management of BLE's off edge of bed and for trunk control    Transfers Overall transfer level: Needs assistance Equipment used: None Transfers: Sit to/from [employer] Sit to Stand: +2 physical assistance;Mod assist         General transfer  comment: Use of Stedy, cues for pulling up with L hand, modA + 2 to boost up from elevated surface (more assist on R side due to flaccidity). Multimodal cues for midline positioning, upward gaze, upright posture    Balance Overall balance assessment: Needs assistance Sitting-balance support: Feet supported Sitting balance-Leahy Scale: Poor Sitting balance - Comments: Pt requiring min guard-modA, cues for midline and upright posture; ptperforming reaching tasks with LUE 2/5 trials accuracy. Postural control: Posterior lean;Right lateral lean Standing balance support: No upper extremity supported;During functional activity Standing balance-Leahy Scale: Poor Standing balance comment: reliant on external support                           ADL either performed or assessed with clinical judgement   ADL Overall ADL's : Needs assistance/impaired Eating/Feeding: NPO   Grooming: Minimal assistance;Wash/dry face;Set up Grooming Details (indicate cue type and reason): set-upA to wash face, but pt did not wash R eye or some of R side of face without cues/assist                             Functional mobility during ADLs: Moderate assistance;Total assistance;+2 for physical assistance General ADL Comments: Pt washing face today. Pt's R side flaccid and 50% of command following and responses with nods/shaking head. Pt turning head to left and cues to look to R, but difficulty performing tasks to R side.     Vision   Vision Assessment?: Yes;Vision impaired- to be further tested in functional context Eye Alignment:  Impaired (comment) Ocular Range of Motion: Restricted on the left;Impaired-to be further tested in functional context Alignment/Gaze Preference: Gaze left;Head turned Tracking/Visual Pursuits: Impaired - to be further tested in functional context Additional Comments: opening eyes more today, but looking to L mostly.   Perception     Praxis      Cognition  Arousal/Alertness: Lethargic Behavior During Therapy: Flat affect Overall Cognitive Status: Difficult to assess                                 General Comments: Limited verbalizations in/out of wakefulness. Able to follow majority of left sided commands. Nodding yes/no to questions.        Exercises     Shoulder Instructions       General Comments Cues to engage R side. Family education to engage with pt.    Pertinent Vitals/ Pain       Pain Assessment: Faces Faces Pain Scale: No hurt  Home Living                                          Prior Functioning/Environment              Frequency  Min 2X/week        Progress Toward Goals  OT Goals(current goals can now be found in the care plan section)  Progress towards OT goals: Progressing toward goals  Acute Rehab OT Goals Patient Stated Goal: pt fiance would like her to get stronger OT Goal Formulation: With patient/family Time For Goal Achievement: 05/30/21 Potential to Achieve Goals: Good ADL Goals Pt Will Perform Grooming: sitting;with min guard assist Pt Will Perform Lower Body Dressing: with mod assist;sitting/lateral leans;sit to/from stand Pt Will Transfer to Toilet: with min assist;with +2 assist;stand pivot transfer;bedside commode Pt/caregiver will Perform Home Exercise Program: Increased strength;Right Upper extremity;Increased ROM;With minimal assist;With written HEP provided Additional ADL Goal #1: Pt will follow 1-2 step commands with minimal cues for arousal in 3/5 trials in non distracting environment. Additional ADL Goal #2: Pt will utilize 3 ADL/iADL items properly with minimal cues to stay on task in 2/3 trials.  Plan Discharge plan remains appropriate    Co-evaluation    PT/OT/SLP Co-Evaluation/Treatment: Yes Reason for Co-Treatment: Complexity of the patient's impairments (multi-system involvement);To address functional/ADL transfers PT goals addressed  during session: Mobility/safety with mobility OT goals addressed during session: ADL's and self-care      AM-PAC OT "6 Clicks" Daily Activity     Outcome Measure   Help from another person eating meals?: Total Help from another person taking care of personal grooming?: A Little Help from another person toileting, which includes using toliet, bedpan, or urinal?: Total Help from another person bathing (including washing, rinsing, drying)?: Total Help from another person to put on and taking off regular upper body clothing?: Total Help from another person to put on and taking off regular lower body clothing?: Total 6 Click Score: 8    End of Session Equipment Utilized During Treatment: Gait belt  OT Visit Diagnosis: Unsteadiness on feet (R26.81);Muscle weakness (generalized) (M62.81);Other symptoms and signs involving cognitive function;Cognitive communication deficit (R41.841);Hemiplegia and hemiparesis Symptoms and signs involving cognitive functions: Other Nontraumatic ICH Hemiplegia - Right/Left: Right Hemiplegia - dominant/non-dominant: Dominant Hemiplegia - caused by: Other Nontraumatic intracranial hemorrhage   Activity Tolerance Patient limited by  fatigue   Patient Left in chair;with call bell/phone within reach;with family/visitor present;Other (comment);with chair alarm set (maxi sky pad left in recliner)   Nurse Communication Mobility status;Need for lift equipment        Time: 0932-1005 OT Time Calculation (min): 33 min  Charges: OT General Charges $OT Visit: 1 Visit OT Treatments $Neuromuscular Re-education: 8-22 mins  Flora Lipps, OTR/L Acute Rehabilitation Services Pager: [phone] Office: [phone]    Marissa Morris C 05/19/2021, 3:04 PM

## 2021-05-20 LAB — BASIC METABOLIC PANEL
Anion gap: 11 (ref 5–15)
BUN: 24 mg/dL — ABNORMAL HIGH (ref 6–20)
CO2: 27 mmol/L (ref 22–32)
Calcium: 8.4 mg/dL — ABNORMAL LOW (ref 8.9–10.3)
Chloride: 98 mmol/L (ref 98–111)
Creatinine, Ser: 0.7 mg/dL (ref 0.44–1.00)
GFR, Estimated: >60 mL/min (ref >60)
Glucose, Bld: 138 mg/dL — ABNORMAL HIGH (ref 70–99)
Potassium: 3.8 mmol/L (ref 3.5–5.1)
Sodium: 136 mmol/L (ref 135–145)

## 2021-05-20 LAB — CBC
HCT: 44.5 % (ref 36.0–46.0)
Hemoglobin: 14.8 g/dL (ref 12.0–15.0)
MCH: 32.5 pg (ref 26.0–34.0)
MCHC: 33.3 g/dL (ref 30.0–36.0)
MCV: 97.8 fL (ref 80.0–100.0)
Platelets: 201 10*3/uL (ref 150–400)
RBC: 4.55 MIL/uL (ref 3.87–5.11)
RDW: 12.9 % (ref 11.5–15.5)
WBC: 12.8 10*3/uL — ABNORMAL HIGH (ref 4.0–10.5)
nRBC: 0 % (ref 0.0–0.2)

## 2021-05-20 LAB — GLUCOSE, CAPILLARY
Glucose-Capillary: 131 mg/dL — ABNORMAL HIGH (ref 70–99)
Glucose-Capillary: 141 mg/dL — ABNORMAL HIGH (ref 70–99)
Glucose-Capillary: 115 mg/dL — ABNORMAL HIGH (ref 70–99)
Glucose-Capillary: 133 mg/dL — ABNORMAL HIGH (ref 70–99)
Glucose-Capillary: 149 mg/dL — ABNORMAL HIGH (ref 70–99)

## 2021-05-20 LAB — CERVICOVAGINAL ANCILLARY ONLY
Bacterial Vaginitis (gardnerella): NEGATIVE
Candida Glabrata: NEGATIVE
Candida Vaginitis: NEGATIVE
Chlamydia: NEGATIVE
Comment: NEGATIVE
Comment: NEGATIVE
Comment: NEGATIVE
Comment: NEGATIVE
Comment: NEGATIVE
Comment: NORMAL
Neisseria Gonorrhea: NEGATIVE
Trichomonas: NEGATIVE

## 2021-05-20 LAB — TRIGLYCERIDES: Triglycerides: 127 mg/dL (ref <150)

## 2021-05-20 MED ORDER — LABETALOL HCL 5 MG/ML IV SOLN
40.0000 mg | INTRAVENOUS | Status: DC | PRN
  Administered 2021-05-20 – 2021-05-23 (×11): 40 mg via INTRAVENOUS
  Filled 2021-05-20 (×12): qty 8

## 2021-05-20 MED ORDER — HYDROCHLOROTHIAZIDE 10 MG/ML ORAL SUSPENSION
25.0000 mg | Freq: Every day | ORAL | Status: DC
  Administered 2021-05-21 – 2021-05-23 (×3): 25 mg
  Filled 2021-05-20 (×3): qty 2.5

## 2021-05-20 MED ORDER — HYDROCHLOROTHIAZIDE 10 MG/ML ORAL SUSPENSION
12.5000 mg | Freq: Once | ORAL | Status: AC
  Administered 2021-05-20: 13 mg
  Filled 2021-05-20: qty 1.88

## 2021-05-20 MED ORDER — HYDRALAZINE HCL 20 MG/ML IJ SOLN
20.0000 mg | INTRAMUSCULAR | Status: DC | PRN
  Administered 2021-05-20 – 2021-05-22 (×5): 20 mg via INTRAVENOUS
  Filled 2021-05-20 (×6): qty 1

## 2021-05-20 NOTE — Progress Notes (Signed)
Physical Therapy Treatment Patient Details Name: Marissa Morris MRN: [id number] DOB: 24-Aug-1961 Today's Date: 05/20/2021    History of Present Illness Marissa Morris is a 60 y.o. female presenting to [facility] in transfer from [facility] for management of acute intracranial hemorrhage. 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 6/3 interval placement of R frontal EVD. No significant PMH.    PT Comments    Pt very sleepy; able to arouse with min stimulation. Good initiation of bed mobility today; exiting towards left side of the bed. Requiring use of Stedy to progress out of bed to the chair. BP 151/83, SpO2 94% RA sitting in recliner. Continues with right hemiparesis, right lateral lean, and poor awareness of safety/deficits. Given age and PLOF, recommend CIR for intensive rehabilitation to address deficits and maximize functional mobility.    Follow Up Recommendations  CIR     Equipment Recommendations  3in1 (PT);Wheelchair (measurements PT);Wheelchair cushion (measurements PT)    Recommendations for Other Services       Precautions / Restrictions Precautions Precautions: Fall;Other (comment) Precaution Comments: EVD, BP < 160 posey belt, NGT Restrictions Weight Bearing Restrictions: No    Mobility  Bed Mobility Overal bed mobility: Needs Assistance Bed Mobility: Supine to Sit     Supine to sit: +2 for physical assistance;Mod assist     General bed mobility comments: Pt initiating well, exiting towards left side of the bed, bringing LLE off, assist for RLE and trunk to upright    Transfers Overall transfer level: Needs assistance Equipment used: None Transfers: Sit to/from Stand Sit to Stand: +2 physical assistance;Mod assist         General transfer comment: Use of Stedy, cues for pulling up with L hand, modA + 2 to boost up from elevated surface. Multimodal cues for midline positioning, upright posture. Heavy right lateral  lean  Ambulation/Gait                 Stairs             Wheelchair Mobility    Modified Rankin (Stroke Patients Only) Modified Rankin (Stroke Patients Only) Pre-Morbid Rankin Score: No symptoms Modified Rankin: Severe disability     Balance Overall balance assessment: Needs assistance Sitting-balance support: Feet supported Sitting balance-Leahy Scale: Poor Sitting balance - Comments: Pt requiring min guard-modA, cues for midline and upright posture Postural control: Posterior lean;Right lateral lean Standing balance support: No upper extremity supported;During functional activity Standing balance-Leahy Scale: Poor Standing balance comment: reliant on external support                            Cognition Arousal/Alertness: Lethargic Behavior During Therapy: Flat affect Overall Cognitive Status: Difficult to assess                                 General Comments: No verbalizations with intermittent eye opening. Nodding yes/no to questions, following left sided commands. Seems more sleepy today      Exercises      General Comments        Pertinent Vitals/Pain Pain Assessment: Faces Faces Pain Scale: No hurt    Home Living                      Prior Function            PT Goals (current goals can  now be found in the care plan section) Acute Rehab PT Goals Patient Stated Goal: pt fiance would like her to get stronger PT Goal Formulation: With patient/family Time For Goal Achievement: 05/30/21 Potential to Achieve Goals: Good Progress towards PT goals: Progressing toward goals    Frequency    Min 4X/week      PT Plan Current plan remains appropriate    Co-evaluation              AM-PAC PT "6 Clicks" Mobility   Outcome Measure  Help needed turning from your back to your side while in a flat bed without using bedrails?: A Little Help needed moving from lying on your back to sitting on the side  of a flat bed without using bedrails?: Total Help needed moving to and from a bed to a chair (including a wheelchair)?: Total Help needed standing up from a chair using your arms (e.g., wheelchair or bedside chair)?: Total Help needed to walk in hospital room?: Total Help needed climbing 3-5 steps with a railing? : Total 6 Click Score: 8    End of Session Equipment Utilized During Treatment: Gait belt Activity Tolerance: Patient tolerated treatment well Patient left: with call bell/phone within reach;with restraints reapplied;in chair;with chair alarm set;with family/visitor present Nurse Communication: Mobility status PT Visit Diagnosis: Unsteadiness on feet (R26.81);Hemiplegia and hemiparesis Hemiplegia - Right/Left: Right Hemiplegia - dominant/non-dominant: Dominant Hemiplegia - caused by: Other Nontraumatic intracranial hemorrhage     Time: 4650-3546 PT Time Calculation (min) (ACUTE ONLY): 27 min  Charges:  $Therapeutic Activity: 23-37 mins                     Lillia Pauls, PT, DPT Acute Rehabilitation Services Pager [phone] Office [phone]    Norval Morton 05/20/2021, 3:29 PM

## 2021-05-20 NOTE — Progress Notes (Addendum)
NAMEKatieann Hungate, MRN:  [id number], DOB:  1961/02/17, LOS: 6 ADMISSION DATE:  05/13/2021, CONSULTATION DATE:  05/14/2021 REFERRING MD:  Stroke team CHIEF COMPLAINT: Intracranial Hemorrhage  HPI/Hospital Course  60 year old female with PMHx significant for HTN, HLD and thyroid disease [facility]) for management of acute ICH.    Per notes/records from [facility], EMS was called to the patient's home and on arrival the patient was found to be slumped over in a chair. Sheprojectilevomited and thenwas noted to beflaccid on the right with right facial droop, was very somnolent and unable to answer questions coherently. On arrival to the ED, BP was 222/128. CT Head revealed 3cm L thalamic hemorrhage with intraventricular extension.  Initial NIHSS was 25. Initial exam by EDP at OSH: Patient awake with slow but comprehensible speech. Oriented to person. Confused. CN were normal. LUE and LLE 5/5, RUE and RLE with flaccid paralysis. Follow up NIHSS was 17.  Past Medical History  HTN, HLD and thyroid dysfunction (unclear if hypothyroid or hyperthyroid)  Imaging:  Carotid dopplers 6/3> 1-39% stenosis   6/2 CT H> L thalamic ICH with ventricular extension  6/3 CT H> Interval placement of R frontal EVD  6/2 ECHO> LVEH 50-55% mild LVH. Grade II diastolic dysfunction.  6/6 CT H > Stable hemorrhage, mass effect, EVD. Less blood in 3rd and 4th ventricles.   Interim History/Subjective:  Reports headache this morning No other significant pain Tmax 99.35F overnight No overnight events  Objective   Blood pressure (!) 150/85, pulse 84, temperature 99.8 F (37.7 C), temperature source Axillary, resp. rate 20, weight 77.7 kg, SpO2 96 %.        Intake/Output Summary (Last 24 hours) at 05/20/2021 0908 Last data filed at 05/20/2021 0900 Gross per 24 hour  Intake 2006.88 ml  Output 222 ml  Net 1784.88 ml   Filed Weights   05/13/21 2256  05/17/21 0400 05/18/21 0500  Weight: 75.3 kg 80.9 kg 77.7 kg   Physical Examination: General: Acutely ill-appearing middle-aged woman in NAD. HEENT: [place]/AT, anicteric sclera, PERRL, moist mucous membranes. EVD in place with red-brown output. Neuro: Awake, but drowsy. Nodding appropriately to questions. Responds to verbal stimuli. Following commands consistently. Unilateral R-sided neglect noted. Left gaze preference.  CV: RRR, no m/g/r. PULM: Breathing even and unlabored on 2LNC. Lung fields CTAB. GI: Soft, nontender, nondistended. Normoactive bowel sounds. Extremities: No LE edema noted. Skin: Warm/dry, no rashes.  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 S/p EVD with NSGY.  - Per Stroke team, appreciate management - EVD management per NSGY (EVD at 20cm H2O), discuss timeline for removal - Continue Keppra for seizure ppx - Goal SBP < 160, titrate Cleviprex - Neuroprotective measures: HOB > 30 degrees, normoglycemia, normothermia, electrolytes WNL - CIR consult pending  HTN Hypertensive emergency with resultant ICH. Longstanding history of HTN - Continue  Amlodipine, Lisinopril, Coreg, HCTZ. - Wean Cleviprex for SBP < 140  Dysphagia - TFs via Cortrak - Ongoing SLP f/u, appreciate assistance - Attempted FEES 6/7, too drowsy to participate/maintain during exam - CIR consult/eval pending  HLD - Consider statin at d/c  Vaginal discharge Leukocytosis - Vaginal swab/culture sent to lab, f/u result - Monitor for continued symptoms - Received ceftriaxone x 1 - Continue doxycycline, metronidazole empirically - Trend WBC   Best Practice (  evaluated daily):  Diet:  Tube Feed  Pain/Anxiety/Delirium protocol (if indicated): Yes (RASS goal 0) VAP protocol (if indicated): Not indicated DVT prophylaxis: Contraindicated  - ICH GI  prophylaxis: PPI Glucose control:  SSI No Central venous access:  N/A Arterial line:  N/A Foley:  N/A Mobility:  OOB  PT consulted: Yes - CIR consult pending Last date of multidisciplinary goals of care discussion [6/5] Code Status:  full code Disposition: ICU - possible dispo to CIR, consult pending  Critical care time: 38 minutes    Tim Lair, PA-C [facility] 05/20/21 9:25 AM  Please see [email or web address] for pager details.  From 7A-7P if no response, please call [phone] After hours, please call ELink [phone]

## 2021-05-20 NOTE — Progress Notes (Signed)
Subjective: NAEs o/n  Objective: Vital signs in last 24 hours: Temp:  [98.6 F (37 C)-99.8 F (37.7 C)] 99.8 F (37.7 C) (06/08 0800) Pulse Rate:  [69-91] 84 (06/08 0800) Resp:  [14-33] 20 (06/08 0800) BP: (120-173)/(68-110) 150/85 (06/08 0800) SpO2:  [85 %-100 %] 96 % (06/08 0800)  Intake/Output from previous day: 06/07 0701 - 06/08 0700 In: 2118 [I.V.:678; NG/GT:1440] Out: 237 [Urine:150; Drains:87] Intake/Output this shift: Total I/O In: 94 [I.V.:34; NG/GT:60] Out: 4 [Drains:4]  Eyes open to stim, f/c on left Mumbles CSF darker tinged.  Lab Results: Recent Labs    05/19/21 0347 05/20/21 0532  WBC 11.0* 12.8*  HGB 14.1 14.8  HCT 43.6 44.5  PLT 201 201   BMET Recent Labs    05/19/21 0347 05/20/21 0532  NA 137 136  K 4.0 3.8  CL 99 98  CO2 28 27  GLUCOSE 148* 138*  BUN 26* 24*  CREATININE 0.81 0.70  CALCIUM 8.6* 8.4*    Studies/Results: No results found.  Assessment/Plan: 60 yo F with L thalamic ICH with IVH - will raise EVD to 25 today   Marissa Morris 05/20/2021, 8:57 AM

## 2021-05-20 NOTE — Progress Notes (Signed)
Inpatient Rehabilitation Admissions Coordinator  I spoke with her sister, Imogene Burn, by phone. She states she and her daughter in [place], Shanda Bumps, would provide 24/7 care of patient after any rehab venue. I await further progress before pursuing insurance approval for CIR. I will follow up tomorrow.  Ottie Glazier, RN, MSN Rehab Admissions Coordinator [phone] 05/20/2021 10:22 AM

## 2021-05-20 NOTE — Progress Notes (Addendum)
STROKE TEAM PROGRESS NOTE   SUBJECTIVE (INTERVAL HISTORY) No family at bedside. Patient is resting in bed. She is alert and cooperative with the examiner. EVD continues to drain well. Pt neurologically unchanged, stable. Still has left gaze preference and right hemiparesis. Blood pressure adequately controlled but she had to go back on Cleviprex drip.   Neurosurgery has increased ventriculostomy pop off to 25 today OBJECTIVE Temp:  [98.6 F (37 C)-100.9 F (38.3 C)] 100.9 F (38.3 C) (06/08 1200) Pulse Rate:  [69-91] 79 (06/08 1300) Cardiac Rhythm: Normal sinus rhythm (06/08 0800) Resp:  [14-27] 16 (06/08 1015) BP: (121-173)/(68-110) 135/82 (06/08 1300) SpO2:  [85 %-100 %] 97 % (06/08 1300)  Recent Labs  Lab 05/19/21 2001 05/19/21 2348 05/20/21 0357 05/20/21 0825 05/20/21 1208  GLUCAP 115* 186* 131* 141* 115*   Recent Labs  Lab 05/15/21 1340 05/15/21 1735 05/16/21 0605 05/16/21 1659 05/17/21 0318 05/18/21 0444 05/19/21 0347 05/20/21 0532  NA 136  --  136  --  139 138 137 136  K 3.7  --  3.8  --  3.7 3.7 4.0 3.8  CL 104  --  101  --  101 100 99 98  CO2 22  --  24  --  GLUCOSE 93  --  163*  --  119* 105* 148* 138*  BUN 10  --  12  --  21* 25* 26* 24*  CREATININE 0.64  --  0.74  --  0.92 0.82 0.81 0.70  CALCIUM 8.2*  --  8.3*  --  8.6* 8.4* 8.6* 8.4*  MG 2.1 2.3 2.5* 2.4  --   --   --   --   PHOS 3.8 3.4 3.6 4.3  --   --   --   --    Recent Labs  Lab 05/14/21 0119  AST 33  ALT 23  ALKPHOS 84  BILITOT 0.7  PROT 7.6  ALBUMIN 4.1   Recent Labs  Lab 05/16/21 0605 05/17/21 0318 05/18/21 0444 05/19/21 0347 05/20/21 0532  WBC 12.5* 12.9* 10.9* 11.0* 12.8*  HGB 15.3* 15.0 14.1 14.1 14.8  HCT 44.5 44.8 43.7 43.6 44.5  MCV 94.9 95.5 98.9 97.5 97.8  PLT 186 201 184 201 201   No results for input(s): CKTOTAL, CKMB, CKMBINDEX, TROPONINI in the last 168 hours. No results for input(s): LABPROT, INR in the last 72 hours. No results for input(s):  COLORURINE, LABSPEC, PHURINE, GLUCOSEU, HGBUR, BILIRUBINUR, KETONESUR, PROTEINUR, UROBILINOGEN, NITRITE, LEUKOCYTESUR in the last 72 hours.  Invalid input(s): APPERANCEUR     Component Value Date/Time   CHOL 203 (H) 05/14/2021 0022   TRIG 127 05/20/2021 0532   HDL 68 05/14/2021 0022   CHOLHDL 3.0 05/14/2021 0022   VLDL 14 05/14/2021 0022   LDLCALC 121 (H) 05/14/2021 0022   Lab Results  Component Value Date   HGBA1C 5.7 (H) 05/14/2021      Component Value Date/Time   LABOPIA NONE DETECTED 05/14/2021 0254   COCAINSCRNUR NONE DETECTED 05/14/2021 0254   LABBENZ NONE DETECTED 05/14/2021 0254   AMPHETMU NONE DETECTED 05/14/2021 0254   THCU NONE DETECTED 05/14/2021 0254   LABBARB NONE DETECTED 05/14/2021 0254    No results for input(s): ETH in the last 168 hours.  I have personally reviewed the radiological images below and agree with the radiology interpretations.  DG Abd 1 View  Result Date: 05/14/2021 CLINICAL DATA:  61 year old female with intracranial hemorrhage. EXAM: ABDOMEN - 1 VIEW COMPARISON:  Portable pelvis  radiograph today reported separately. FINDINGS: Portable AP supine view at 0344 hours. Non obstructed bowel gas pattern. No definite pneumoperitoneum on this supine view. Minimally included left lung base appears grossly negative. Scoliosis and degenerative changes in the spine. Pelvic dystrophic and/or vascular calcifications, mild. No acute osseous abnormality identified. IMPRESSION: 1. Normal bowel gas pattern. 2. Spinal scoliosis and degeneration. Electronically Signed   By: Odessa Fleming M.D.   On: 05/14/2021 06:19   CT HEAD WO CONTRAST  Result Date: 05/18/2021 CLINICAL DATA:  60 year old female with left thalamic hemorrhage, IVH, EVD. EXAM: CT HEAD WITHOUT CONTRAST TECHNIQUE: Contiguous axial images were obtained from the base of the skull through the vertex without intravenous contrast. COMPARISON:  Head CT 05/15/2021 and earlier. FINDINGS: Brain: Right superior frontal  approach EVD with catheter terminating at the 3rd ventricle and communicating with the right lateral ventricle on coronal image 35. The 3rd and 4th ventricles remain relatively decompressed. There is less 3rd and 4th ventricular IVH. Lateral ventricle size also mildly decreased since 05/15/2021, although with moderate residual lateral ventricular blood greater on the left. Oval left thalamic intra-axial hemorrhage encompasses 34 x 22 by 33 mm now (AP by transverse by CC) for an estimated blood volume of 12 mL, and has not changed since 05/15/2021. Regional edema and mild regional mass effect are stable. Stable gray-white matter differentiation elsewhere with Patchy and confluent white matter hypodensity. No new cortically based infarct identified. Basilar cisterns are patent. No significant midline shift. Vascular: Calcified atherosclerosis at the skull base. Skull: Stable right superior burr hole. Sinuses/Orbits: Visualized paranasal sinuses and mastoids are stable and well aerated. Other: Right nasoenteric tube in place. Mild postoperative changes to the scalp vertex. Disconjugate gaze but otherwise negative orbits soft tissues. IMPRESSION: 1. Stable left thalamic intra-axial hemorrhage size since 05/15/2021. Stable regional edema and mild regional mass effect. 2. Stable right frontal approach EVD with slightly decreased lateral ventricle size since 05/15/2021. Moderate lateral ventricle IVH persists. There is less blood in the small 3rd and 4th ventricles now. 3. Underlying chronic small vessel disease. No new intracranial abnormality. Electronically Signed   By: Odessa Fleming M.D.   On: 05/18/2021 06:31   CT HEAD WO CONTRAST  Result Date: 05/15/2021 CLINICAL DATA:  Cerebral hemorrhage suspected. EXAM: CT HEAD WITHOUT CONTRAST TECHNIQUE: Contiguous axial images were obtained from the base of the skull through the vertex without intravenous contrast. COMPARISON:  MRI and CT of the brain May 14, 2021. FINDINGS: Brain:  No significant interval change size of the left thalamic hemorrhage with hematoma now measuring approximately 3.5 x 3.3 x 2 cm compared to 3.3 x 3.3 x 2.2 cm on prior. Persistent hypodensity surrounding the hematoma related to vasogenic edema. Supratentorial and infratentorial intraventricular hemorrhage related to hematoma decompression into the right lateral ventricle, status post right frontal approach ventricular drain placement. Drain tip is at level of the right foramen of Monro. There is mild interval decrease in size of the supratentorial ventricles, particularly the temporal horns. Patchy hypodensity of the white matter of the cerebral hemispheres, nonspecific most likely related to small vessel ischemia. Remote lacunar infarct in the right radiata. Vascular: No hyperdense vessel or unexpected calcification. Skull: Negative for fracture or focal lesion. Sinuses/Orbits: No acute finding. Other: None. IMPRESSION: 1. Interval placement of a right frontal approach ventricular drain with the tip at the level of the right foramen of Monro. Interval mild decrease in size of the ventricular system, particularly the temporal horns of the lateral ventricles. 2. Stable  appearance of left thalamic intraparenchymal hemorrhage with intraventricular extension. Electronically Signed   By: Baldemar Lenis M.D.   On: 05/15/2021 09:41   CT HEAD WO CONTRAST  Result Date: 05/14/2021 CLINICAL DATA:  Intraparenchymal hemorrhage.  Follow-up. EXAM: CT HEAD WITHOUT CONTRAST TECHNIQUE: Contiguous axial images were obtained from the base of the skull through the vertex without intravenous contrast. COMPARISON:  Same day CT head. FINDINGS: Mildly motion limited exam.  Within this limitation: Brain: When accounting for differences in technique, similar size of the acute intraparenchymal hemorrhage centered in the left thalamus which measures up to 3.3 x 2.2 x 3.3 cm (similar when remeasured on the prior). Similar  regional mass effect. Similar intraventricular hemorrhage within the left greater than right lateral ventricles, third ventricle, and fourth ventricle. Similar ventriculomegaly with mild periventricular edema and prominence of the temporal horns. Additional scattered white matter hypoattenuation is nonspecific but most likely related to chronic microvascular ischemic disease. No evidence of acute large vascular territory infarct. Similar suspected prior infarct in the right corona radiata. Vascular: No hyperdense vessel identified. Calcific atherosclerosis. Skull: No acute fracture. Sinuses/Orbits: No acute findings. Other: No mastoid effusions. IMPRESSION: When accounting for differences in technique, similar size of the acute left thalamic intraparenchymal hemorrhage. Similar intraventricular extension of hemorrhage and ventriculomegaly. Electronically Signed   By: Feliberto Harts MD   On: 05/14/2021 06:21   CT HEAD WO CONTRAST  Result Date: 05/14/2021 CLINICAL DATA:  Intraparenchymal hemorrhage. Follow-up of scans performed at [place] [place] [facility]. EXAM: CT HEAD WITHOUT CONTRAST TECHNIQUE: Contiguous axial images were obtained from the base of the skull through the vertex without intravenous contrast. COMPARISON:  None. I am not able to view the outside institution scans. FINDINGS: Brain: Intraparenchymal hematoma centered in the left thalamus measures 3.2 x 2.2 x 3.0 cm (volume = 11 cm^3). There is moderate hydrocephalus with periventricular hypoattenuation suggesting transependymal interstitial edema. There is rightward bulging of the septum pellucidum. Large amount of blood in the ventricles. Vascular: No abnormal hyperdensity of the major intracranial arteries or dural venous sinuses. No intracranial atherosclerosis. Skull: The visualized skull base, calvarium and extracranial soft tissues are normal. Sinuses/Orbits: No fluid levels or advanced mucosal thickening of the visualized paranasal sinuses.  No mastoid or middle ear effusion. The orbits are normal. IMPRESSION: 1. Intraparenchymal hematoma centered in the left thalamus with intraventricular extension and moderate hydrocephalus with transependymal interstitial edema. Critical Value/emergent results were called by telephone at the time of interpretation on 05/14/2021 at 12:58 am to provider [facility] , who verbally acknowledged these results. Electronically Signed   By: Deatra Robinson M.D.   On: 05/14/2021 00:58   MR MRA HEAD WO CONTRAST  Result Date: 05/14/2021 CLINICAL DATA:  Intracranial hemorrhage EXAM: MRI HEAD WITHOUT CONTRAST MRA HEAD WITHOUT CONTRAST TECHNIQUE: Multiplanar, multi-echo pulse sequences of the brain and surrounding structures were acquired without intravenous contrast. Angiographic images of the Circle of Willis were acquired using MRA technique without intravenous contrast. COMPARISON: No pertinent prior exam. COMPARISON:  Correlation made with prior head CT FINDINGS: MRI HEAD Brain: Left thalamic hemorrhage is again identified similar to the recent CT with acute blood products. There is mild surrounding edema. Significant intraventricular extension is present as seen on CT with resulting hydrocephalus. Focus susceptibility in the right corona radiata reflects chronic microhemorrhage associated with a lacunar infarct. Additional patchy and confluent areas of T2 hyperintensity in the supratentorial white matter are nonspecific but probably reflect moderate chronic microvascular ischemic changes. No post contrast  imaging performed therefore potential underlying lesion is not well evaluated. Vascular: Major vessel flow voids at the skull base are preserved. Skull and upper cervical spine: Normal marrow signal is preserved. Sinuses/Orbits: Paranasal sinuses are aerated. Orbits are unremarkable. Other: Sella is unremarkable.  Mastoid air cells are clear. MRA HEAD Intracranial internal carotid arteries are patent. Middle and anterior  cerebral arteries are patent. Intracranial vertebral arteries, basilar artery, posterior cerebral arteries are patent. There is no significant stenosis or aneurysm. No abnormal flow related enhancement in the region of thalamic hemorrhage. IMPRESSION: No substantial change in left thalamic hemorrhage with significant intraventricular extension and hydrocephalus as well as mild edema and mass effect compared to recent CT. 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 Moderate chronic microvascular ischemic changes. Unremarkable MRA. Electronically Signed   By: Guadlupe Spanish M.D.   On: 05/14/2021 11:18   MR BRAIN WO CONTRAST  Result Date: 05/14/2021 CLINICAL DATA:  Intracranial hemorrhage EXAM: MRI HEAD WITHOUT CONTRAST MRA HEAD WITHOUT CONTRAST TECHNIQUE: Multiplanar, multi-echo pulse sequences of the brain and surrounding structures were acquired without intravenous contrast. Angiographic images of the Circle of Willis were acquired using MRA technique without intravenous contrast. COMPARISON: No pertinent prior exam. COMPARISON:  Correlation made with prior head CT FINDINGS: MRI HEAD Brain: Left thalamic hemorrhage is again identified similar to the recent CT with acute blood products. There is mild surrounding edema. Significant intraventricular extension is present as seen on CT with resulting hydrocephalus. Focus susceptibility in the right corona radiata reflects chronic microhemorrhage associated with a lacunar infarct. Additional patchy and confluent areas of T2 hyperintensity in the supratentorial white matter are nonspecific but probably reflect moderate chronic microvascular ischemic changes. No post contrast imaging performed therefore potential underlying lesion is not well evaluated. Vascular: Major vessel flow voids at the skull base are preserved. Skull and upper cervical spine: Normal marrow signal is preserved. Sinuses/Orbits: Paranasal sinuses are  aerated. Orbits are unremarkable. Other: Sella is unremarkable.  Mastoid air cells are clear. MRA HEAD Intracranial internal carotid arteries are patent. Middle and anterior cerebral arteries are patent. Intracranial vertebral arteries, basilar artery, posterior cerebral arteries are patent. There is no significant stenosis or aneurysm. No abnormal flow related enhancement in the region of thalamic hemorrhage. IMPRESSION: No substantial change in left thalamic hemorrhage with significant intraventricular extension and hydrocephalus as well as mild edema and mass effect compared to recent CT. 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 Moderate chronic microvascular ischemic changes. Unremarkable MRA. Electronically Signed   By: Guadlupe Spanish M.D.   On: 05/14/2021 11:18   DG Pelvis Portable  Result Date: 05/14/2021 CLINICAL DATA:  60 year old female with intracranial hemorrhage. EXAM: PORTABLE PELVIS 1-2 VIEWS COMPARISON:  None. FINDINGS: Portable AP view at 0346 hours. Mild likely fibroid associated dystrophic calcifications in the pelvis eccentric to the right. Femoral heads normally located. Pelvis and proximal femurs appear intact. No acute osseous abnormality identified. Negative visible bowel gas pattern. IMPRESSION: Negative. Electronically Signed   By: Odessa Fleming M.D.   On: 05/14/2021 06:18   DG CHEST PORT 1 VIEW  Result Date: 05/17/2021 CLINICAL DATA:  Respiratory failure EXAM: PORTABLE CHEST 1 VIEW COMPARISON:  May 14, 2021 FINDINGS: Feeding tube tip is below the diaphragm. No pneumothorax. There is no edema or airspace opacity. There is cardiomegaly with pulmonary vascularity normal. No adenopathy. No bone lesions. IMPRESSION: No edema or airspace opacity. Cardiomegaly, stable. Feeding tube tip below diaphragm.  Electronically Signed   By: Bretta Bang III M.D.   On: 05/17/2021 14:17   DG CHEST PORT 1 VIEW  Result Date: 05/14/2021 CLINICAL DATA:   Provided history: Follow-up. EXAM: PORTABLE CHEST 1 VIEW COMPARISON:  None. FINDINGS: Cardiomediastinal silhouette is enlarged, likely accentuated by AP technique. Calcific atherosclerosis of the aorta. Low lung volumes. No consolidation. EKG leads bilaterally. No visible pleural effusions or pneumothorax on this single semi erect AP radiograph. IMPRESSION: 1. Low lung volumes without evidence of acute cardiopulmonary disease. 2. Cardiomegaly. Electronically Signed   By: Feliberto Harts MD   On: 05/14/2021 06:13   DG Abd Portable 1V  Result Date: 05/15/2021 CLINICAL DATA:  Feeding tube placement. EXAM: PORTABLE ABDOMEN - 1 VIEW COMPARISON:  None. FINDINGS: Feeding tube is in place with the tip in the distal stomach directed toward the duodenum. IMPRESSION: As above. Electronically Signed   By: Drusilla Kanner M.D.   On: 05/15/2021 11:40   ECHOCARDIOGRAM COMPLETE  Result Date: 05/16/2021    ECHOCARDIOGRAM REPORT   Patient Name:   Marissa Morris Date of Exam: 05/14/2021 Medical Rec #:  [id number]    Height: Accession #:    [id number]   Weight:       166.0 lb Date of Birth:  December 01, 1961    BSA:          1.783 m Patient Age:    59 years     BP:           122/69 mmHg Patient Gender: F            HR:           77 bpm. Exam Location:  Inpatient Procedure: 2D Echo, Cardiac Doppler and Color Doppler Indications:    Stroke  History:        Patient has no prior history of Echocardiogram examinations.  Sonographer:    Ross Ludwig RDCS (AE) Referring Phys: [phone] ERIC LINDZEN  Sonographer Comments: Patient not responsive to request to sniff for IVC collapse test. IMPRESSIONS  1. Left ventricular ejection fraction, by estimation, is 50 to 55%. The left ventricle has low normal function. The left ventricle has no regional wall motion abnormalities. There is moderate thickening of the septum. The rest of the LV segments demonstrate mild left ventricular hypertrophy. Left ventricular diastolic parameters are consistent with Grade II  diastolic dysfunction (pseudonormalization). Elevated left ventricular end-diastolic pressure.  2. The average left ventricular global longitudinal strain is -6.7 %. The global longitudinal strain is abnormal. Strain pattern shows relative apical sparring which may be suggestive of amyloid in the right clinic context.  3. Right ventricular systolic function is normal. The right ventricular size is normal.  4. The mitral valve is normal in structure. Trivial mitral valve regurgitation. No evidence of mitral stenosis.  5. The aortic valve is tricuspid. There is mild calcification of the aortic valve. There is mild thickening of the aortic valve. Aortic valve regurgitation is not visualized. Mild aortic valve sclerosis is present, with no evidence of aortic valve stenosis.  6. The inferior vena cava is dilated in size with <50% respiratory variability, suggesting right atrial pressure of 15 mmHg.  7. Left atrial size was mildly dilated. Comparison(s): No prior Echocardiogram. Conclusion(s)/Recommendation(s): No intracardiac source of embolism detected on this transthoracic study. A transesophageal echocardiogram is recommended to exclude cardiac source of embolism if clinically indicated. FINDINGS  Left Ventricle: Left ventricular ejection fraction, by estimation, is 50 to 55%. The left ventricle has low normal function.  The left ventricle has no regional wall motion abnormalities. The average left ventricular global longitudinal strain is -6.7 %.  The global longitudinal strain is abnormal. The left ventricular internal cavity size was normal in size. There is moderate thickening of the septum. The rest of the LV segments demonstrate mild left ventricular hypertrophy. Left ventricular diastolic parameters are consistent with Grade II diastolic dysfunction (pseudonormalization). Elevated left ventricular end-diastolic pressure. Right Ventricle: The right ventricular size is normal. No increase in right ventricular wall  thickness. Right ventricular systolic function is normal. Left Atrium: Left atrial size was mildly dilated. Right Atrium: Right atrial size was normal in size. Pericardium: There is no evidence of pericardial effusion. Mitral Valve: The mitral valve is normal in structure. There is mild thickening of the mitral valve leaflet(s). There is mild calcification of the mitral valve leaflet(s). Mild mitral annular calcification. Trivial mitral valve regurgitation. No evidence  of mitral valve stenosis. MV peak gradient, 7.1 mmHg. The mean mitral valve gradient is 4.0 mmHg. Tricuspid Valve: The tricuspid valve is normal in structure. Tricuspid valve regurgitation is trivial. No evidence of tricuspid stenosis. Aortic Valve: The aortic valve is tricuspid. There is mild calcification of the aortic valve. There is mild thickening of the aortic valve. Aortic valve regurgitation is not visualized. Mild aortic valve sclerosis is present, with no evidence of aortic valve stenosis. Aortic valve mean gradient measures 8.0 mmHg. Aortic valve peak gradient measures 16.3 mmHg. Aortic valve area, by VTI measures 2.05 cm. Pulmonic Valve: The pulmonic valve was normal in structure. Pulmonic valve regurgitation is trivial. No evidence of pulmonic stenosis. Aorta: The aortic root and ascending aorta are structurally normal, with no evidence of dilitation. Venous: The inferior vena cava is dilated in size with less than 50% respiratory variability, suggesting right atrial pressure of 15 mmHg. IAS/Shunts: No atrial level shunt detected by color flow Doppler.  LEFT VENTRICLE PLAX 2D LVIDd:         4.70 cm  Diastology LVIDs:         4.20 cm  LV e' medial:    5.55 cm/s LV PW:         1.60 cm  LV E/e' medial:  23.2 LV IVS:        1.80 cm  LV e' lateral:   6.31 cm/s LVOT diam:     1.80 cm  LV E/e' lateral: 20.4 LV SV:         69 LV SV Index:   39       2D Longitudinal Strain LVOT Area:     2.54 cm 2D Strain GLS Avg:     -6.7 %  RIGHT VENTRICLE              IVC RV Basal diam:  3.50 cm     IVC diam: 2.30 cm RV S prime:     13.20 cm/s TAPSE (M-mode): 3.2 cm LEFT ATRIUM             Index       RIGHT ATRIUM           Index LA diam:        4.10 cm 2.30 cm/m  RA Area:     16.30 cm LA Vol (A2C):   42.1 ml 23.61 ml/m RA Volume:   45.00 ml  25.23 ml/m LA Vol (A4C):   43.6 ml 24.45 ml/m LA Biplane Vol: 47.3 ml 26.52 ml/m  AORTIC VALVE AV Area (Vmax):    1.86 cm AV  Area (Vmean):   2.09 cm AV Area (VTI):     2.05 cm AV Vmax:           202.00 cm/s AV Vmean:          135.000 cm/s AV VTI:            0.336 m AV Peak Grad:      16.3 mmHg AV Mean Grad:      8.0 mmHg LVOT Vmax:         148.00 cm/s LVOT Vmean:        111.000 cm/s LVOT VTI:          0.271 m LVOT/AV VTI ratio: 0.81  AORTA Ao Root diam: 2.90 cm Ao Asc diam:  3.20 cm MITRAL VALVE MV Area (PHT): 3.08 cm     SHUNTS MV Area VTI:   1.83 cm     Systemic VTI:  0.27 m MV Peak grad:  7.1 mmHg     Systemic Diam: 1.80 cm MV Mean grad:  4.0 mmHg MV Vmax:       1.33 m/s MV Vmean:      90.6 cm/s MV Decel Time: 246 msec MV E velocity: 129.00 cm/s MV A velocity: 118.00 cm/s MV E/A ratio:  1.09 Laurance FlattenHeather Pemberton MD Electronically signed by Laurance FlattenHeather Pemberton MD Signature Date/Time: 05/16/2021/7:51:23 AM    Final    VAS US CAROTID  Result Date: 05/15/2021 Carotid Arterial Duplex Study Patient Name:  Hassan RowanDELORIS Jacobsen  Date of Exam:   05/14/2021 Medical Rec #: [id number]     Accession #:    [id number] Date of Birth: [birth year]     Patient Gender: F Patient Age:   [age]Y [facility] Procedure:      VAS US CAROTID Referring Phys: [id number] Marvel PlanJINDONG XU --------------------------------------------------------------------------------  Indications:       Hypertensive emergency (122/128). 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 Risk Factors:      Hypertension. Comparison Study:  No prior study on file Performing Technologist: Sherren Kernsandace Kanady RVS  Examination Guidelines: A  complete evaluation includes B-mode imaging, spectral Doppler, color Doppler, and power Doppler as needed of all accessible portions of each vessel. Bilateral testing is considered an integral part of a complete examination. Limited examinations for reoccurring indications may be performed as noted.  Right Carotid Findings: +----------+--------+--------+--------+------------------+--------+           PSV cm/sEDV cm/sStenosisPlaque DescriptionComments +----------+--------+--------+--------+------------------+--------+ CCA Prox  96      9                                          +----------+--------+--------+--------+------------------+--------+ CCA Distal104     13                                         +----------+--------+--------+--------+------------------+--------+ ICA Prox  66      15      1-39%   heterogenous               +----------+--------+--------+--------+------------------+--------+ ICA Distal54      13                                         +----------+--------+--------+--------+------------------+--------+  ECA       151     22                                         +----------+--------+--------+--------+------------------+--------+ +----------+--------+-------+--------+-------------------+           PSV cm/sEDV cmsDescribeArm Pressure (mmHG) +----------+--------+-------+--------+-------------------+ Subclavian150                                        +----------+--------+-------+--------+-------------------+ +---------+--------+--+--------+--+---------+ VertebralPSV cm/s41EDV cm/s12Antegrade +---------+--------+--+--------+--+---------+  Left Carotid Findings: +----------+--------+--------+--------+------------------+------------------+           PSV cm/sEDV cm/sStenosisPlaque DescriptionComments           +----------+--------+--------+--------+------------------+------------------+ CCA Prox  180     15                                                    +----------+--------+--------+--------+------------------+------------------+ CCA Distal98      13                                intimal thickening +----------+--------+--------+--------+------------------+------------------+ ICA Prox  70      22      1-39%                     intimal thickening +----------+--------+--------+--------+------------------+------------------+ ICA Distal68      22                                                   +----------+--------+--------+--------+------------------+------------------+ ECA       91      18                                                   +----------+--------+--------+--------+------------------+------------------+ +----------+--------+--------+--------+-------------------+           PSV cm/sEDV cm/sDescribeArm Pressure (mmHG) +----------+--------+--------+--------+-------------------+ [id number]                                          +----------+--------+--------+--------+-------------------+ +---------+--------+--+--------+--+---------+ VertebralPSV cm/s61EDV cm/s13Antegrade +---------+--------+--+--------+--+---------+   Summary: Right Carotid: Velocities in the right ICA are consistent with a 1-39% stenosis. Left Carotid: Velocities in the left ICA are consistent with a 1-39% stenosis. Vertebrals: Bilateral vertebral arteries demonstrate antegrade flow. *See table(s) above for measurements and observations.  Electronically signed by Marvel Plan MD on 05/15/2021 at 2:54:40 PM.    Final      PHYSICAL EXAM  Temp:  [98.6 F (37 C)-100.9 F (38.3 C)] 100.9 F (38.3 C) (06/08 1200) Pulse Rate:  [69-91] 79 (06/08 1300) Resp:  [14-27] 16 (06/08 1015) BP: (121-173)/(68-110) 135/82 (06/08 1300) SpO2:  [85 %-100 %] 97 % (06/08 1300)  General -middle-aged African-American lady, no acute distress Cardiovascular - Regular rhythm and  rate. Neuro -awake and alert but continues to have  expressive greater than receptive aphasia and speaks only occasional words.  Cannot speak sentences.  Follow some simple commands, ft gaze preference and barely cross midline, not blinking to visual threat consistently, PERRL. Right facial droop. Tongue protrusion not cooperative. Spontaneously moving LUE against gravity, no drift, flaccid on the RUE however with pain, bicep 3-/5. Withdraw to pain LLE 3/5, but only mild withdraw 2/5 at RLE. Sensation, coordination not cooperative and gait not tested.  ASSESSMENT/PLAN Ms. Jamoni Broadfoot is a 60 y.o. female with no significant history admitted for N/V, right sided weakness and facial droop. No tPA given due to ICH.    ICH with IVH:  Left thalamic ICH with extensive IVH -  secondary to hypertensive emergency  CT head left thalamic ICH with IVH  Repeat CT head stable ICH and ventricles  MRI  Stable ICH and ventricles  MRA  Unremarkable.   Carotid Doppler  <50% BL  2D Echo  Normal EF 50-55%, left ventricular global longitudinal strain is  apical sparring which may be suggestive of amyloid in the right clinic  context.    LDL 121  HgbA1c 5.7  SCDs for VTE prophylaxis, can start chemical prophylaxis when drain is off  No antithrombotic prior to admission, now on No antithrombotic due to ICH  Ongoing aggressive stroke risk factor management  Therapy recommendations:  CIR  Disposition:  Pending   Hydrocephalus   Slightly enlarged ventricles   Repeat CT and MRI showed stable ventricle size  S/p EVD yesterday, draining well 10cc/h  Repeat CT 6/3 mildly decreased ventricle size  On keppra 500mg  bid for seizure prophylaxis   Close monitoring  Hypertensive emergency  . HCTZ 13 mg daily . Lisinoril 20 mg daily  . Carvedilol 12.5 bid . On labetalol PRN . Maintain SBP <160 for now, continue to titrate Clevidipine   Long term BP goal normotensive  Hyperlipidemia  Home meds:  none   LDL 121, goal < 70  Consider statin at  discharge  Dysphagia   Did not pass swallow  NPO now  Cortrak   Other Active Problems #Fever  #Leukocytosis  Fever noted overnight on 6/7. She continues to fever sporadically. No clear sign of infection, ventriculostomy site looks good, no cough, no cough.  - ICU following for fever work up; vaginal swab/culture sent to lab. She received Ceftriaxone x1 and is on doxycycline + metronidazole empirically   Hospital day # 6   8/7, NP [facility] Nurse Practitioner  Patient seen and discussed with attending physician Dr. Stark Jock   Patient is doing well and blood pressure is adequately controlled.  Continue ventriculostomy and is draining well and Popoff has been raised to 25cm today as per neurosurgery.  Hopefully ventriculostomy will be discontinued the next few days.  Continue to wean Cleviprex drip and use as needed IV labetalol and hydralazine for systolic greater than 160.  Continue oral blood pressure medications to core track tube..  Continue ongoing therapy consults. Discussed with Dr. Pearlean Brownie critical care medicine.  Discussed with her family at the bedside This patient is critically ill and at significant risk of neurological worsening, death and care requires constant monitoring of vital signs, hemodynamics,respiratory and cardiac monitoring, extensive review of multiple databases, frequent neurological assessment, discussion with family, other specialists and medical decision making of high complexity.I have made any additions or clarifications directly to the above note.This critical care time does not reflect procedure time, or teaching  time or supervisory time of PA/NP/Med Resident etc but could involve care discussion time.  I spent 30 minutes of neurocritical care time  in the care of  this patient.     Delia Heady, MD Medical Director [facility] Stroke Center Pager: [phone] 05/20/2021 2:52 PM  To contact Stroke Continuity provider, please refer to  [email or web address]. After hours, contact General Neurology

## 2021-05-20 NOTE — Progress Notes (Signed)
SLP Cancellation Note  Patient Details Name: Marissa Morris MRN: [id number] DOB: 10/24/1961   Cancelled treatment:       Reason Eval/Treat Not Completed: Fatigue/lethargy limiting ability to participate, Pt will wake up per RN, but will await increased sustained arousal prior to FEES to maximize chance of success.    Marissa Morris, [name] 05/20/2021, 9:08 AM

## 2021-05-21 ENCOUNTER — Inpatient Hospital Stay (HOSPITAL_COMMUNITY): Payer: BLUE CROSS/BLUE SHIELD

## 2021-05-21 DIAGNOSIS — N898 Other specified noninflammatory disorders of vagina: Secondary | ICD-10-CM

## 2021-05-21 LAB — GLUCOSE, CAPILLARY
Glucose-Capillary: 152 mg/dL — ABNORMAL HIGH (ref 70–99)
Glucose-Capillary: 165 mg/dL — ABNORMAL HIGH (ref 70–99)
Glucose-Capillary: 165 mg/dL — ABNORMAL HIGH (ref 70–99)
Glucose-Capillary: 170 mg/dL — ABNORMAL HIGH (ref 70–99)
Glucose-Capillary: 170 mg/dL — ABNORMAL HIGH (ref 70–99)
Glucose-Capillary: 189 mg/dL — ABNORMAL HIGH (ref 70–99)
Glucose-Capillary: 190 mg/dL — ABNORMAL HIGH (ref 70–99)

## 2021-05-21 LAB — CBC
HCT: 42.3 % (ref 36.0–46.0)
Hemoglobin: 13.8 g/dL (ref 12.0–15.0)
MCH: 31.7 pg (ref 26.0–34.0)
MCHC: 32.6 g/dL (ref 30.0–36.0)
MCV: 97 fL (ref 80.0–100.0)
Platelets: 202 10*3/uL (ref 150–400)
RBC: 4.36 MIL/uL (ref 3.87–5.11)
RDW: 13.1 % (ref 11.5–15.5)
WBC: 12 10*3/uL — ABNORMAL HIGH (ref 4.0–10.5)
nRBC: 0 % (ref 0.0–0.2)

## 2021-05-21 LAB — MAGNESIUM: Magnesium: 2.6 mg/dL — ABNORMAL HIGH (ref 1.7–2.4)

## 2021-05-21 LAB — BASIC METABOLIC PANEL
Anion gap: 13 (ref 5–15)
BUN: 32 mg/dL — ABNORMAL HIGH (ref 6–20)
CO2: 25 mmol/L (ref 22–32)
Calcium: 8.3 mg/dL — ABNORMAL LOW (ref 8.9–10.3)
Chloride: 97 mmol/L — ABNORMAL LOW (ref 98–111)
Creatinine, Ser: 0.78 mg/dL (ref 0.44–1.00)
GFR, Estimated: >60 mL/min (ref >60)
Glucose, Bld: 158 mg/dL — ABNORMAL HIGH (ref 70–99)
Potassium: 3.8 mmol/L (ref 3.5–5.1)
Sodium: 135 mmol/L (ref 135–145)

## 2021-05-21 LAB — PHOSPHORUS: Phosphorus: 4.2 mg/dL (ref 2.5–4.6)

## 2021-05-21 IMAGING — CT CT HEAD W/O CM
4 of 5 series · 15 of 47 positions shown, 17 images · non-contrast
Comparison: Three days ago

CLINICAL DATA: Stroke follow-up

EXAM:
CT HEAD WITHOUT CONTRAST
TECHNIQUE: Contiguous axial images were obtained from the base of the skull
through the vertex without intravenous contrast.

[Series 3: head without · axial · non-contrast · 0.44mm/px · z∈[+1303,+1408]mm · 4 of 36 slices shown]
[im 8/36  brain]
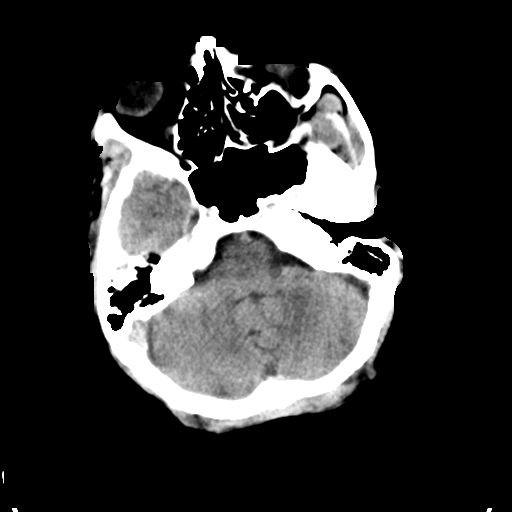
[im 15/36  brain]
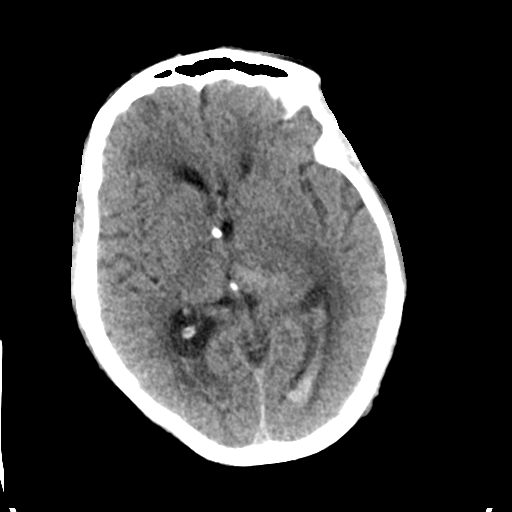
[im 22/36  brain]
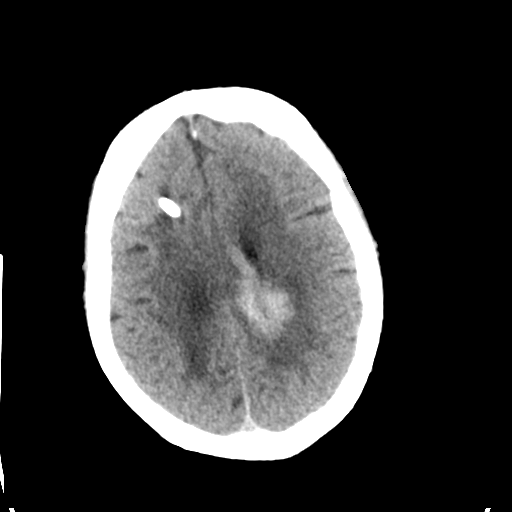
[im 29/36  brain]
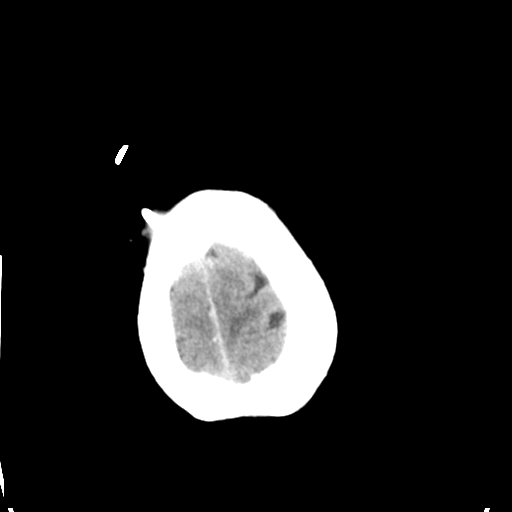

[Series 5: head without ax · axial · non-contrast · 0.36mm/px · z∈[+1276,+1390]mm · 5 of 36 slices shown, 7 images]
[im 6/36  brain]
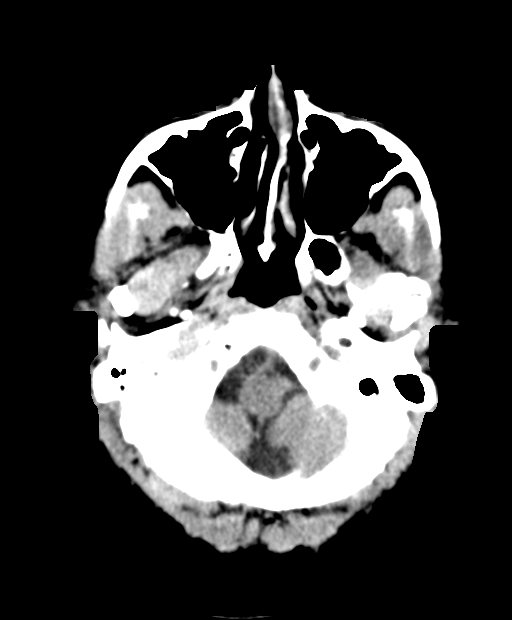
[im 6/36  bone]
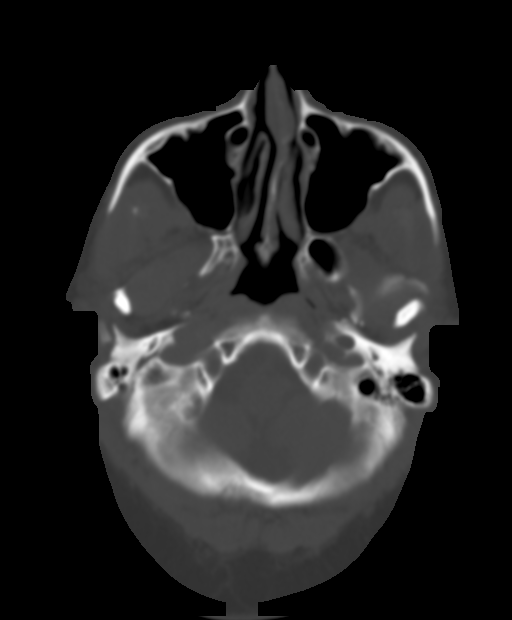
[im 12/36  brain]
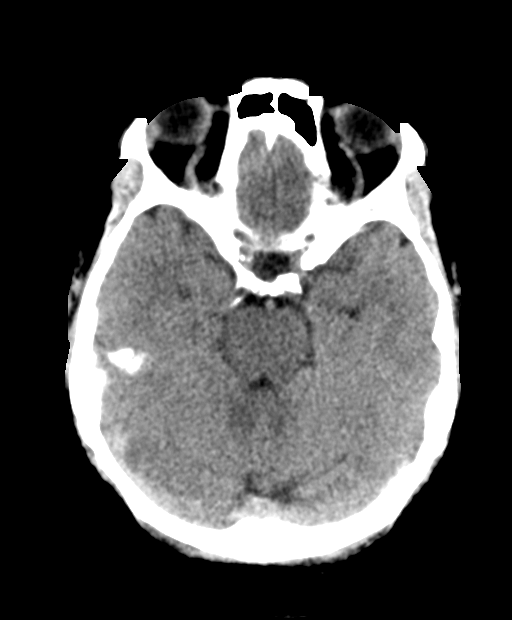
[im 18/36  brain]
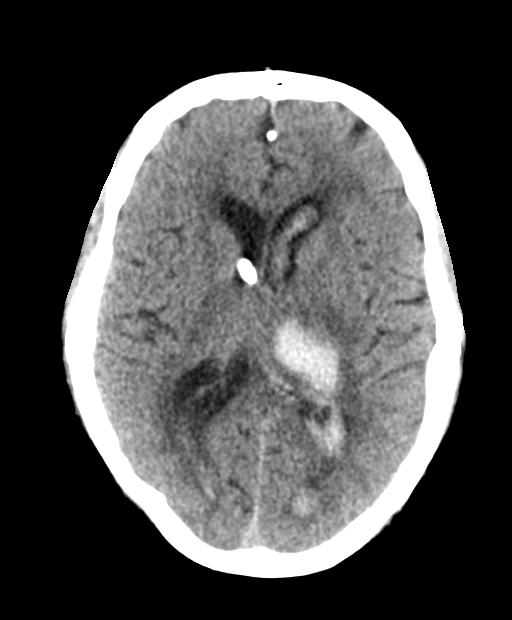
[im 24/36  brain]
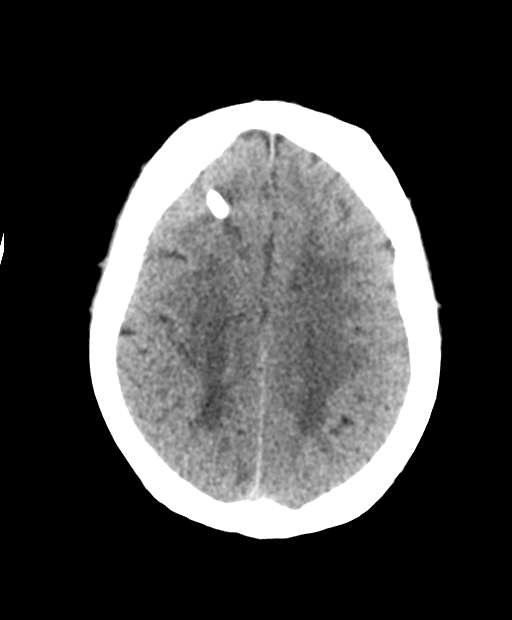
[im 30/36  brain]
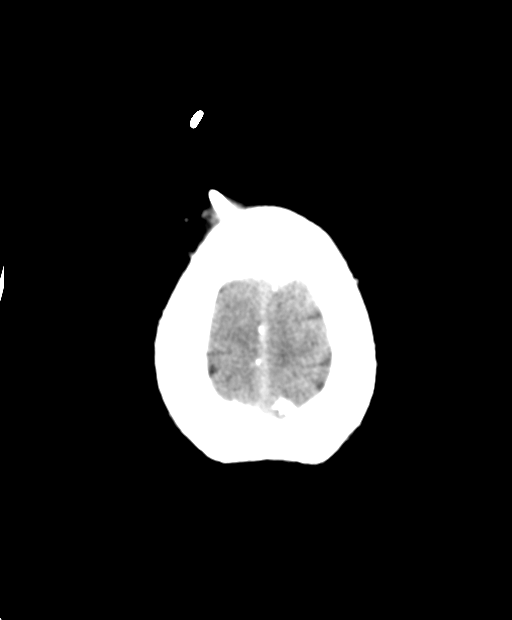
[im 30/36  bone]
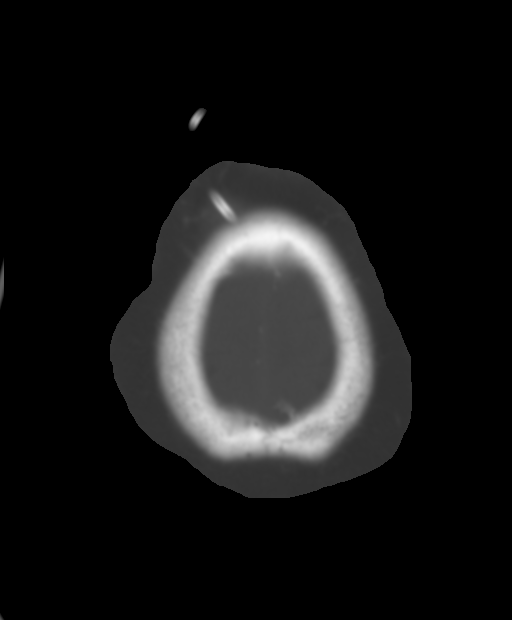

[Series 6: head without cor · coronal · non-contrast · 0.35mm/px · 3 of 63 slices shown]
[im 21/63  brain]
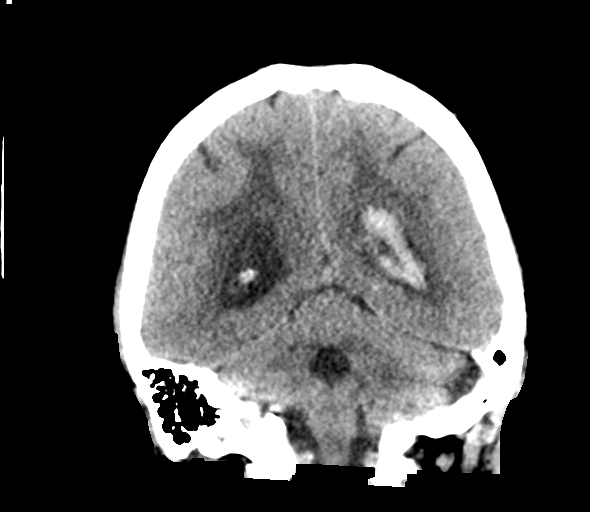
[im 28/63  brain]
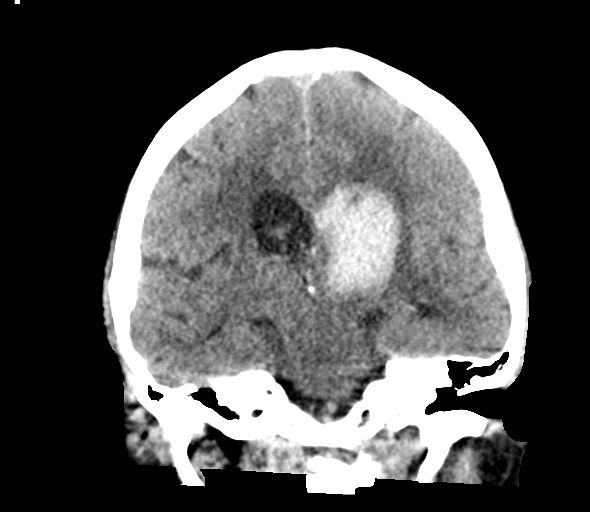
[im 35/63  brain]
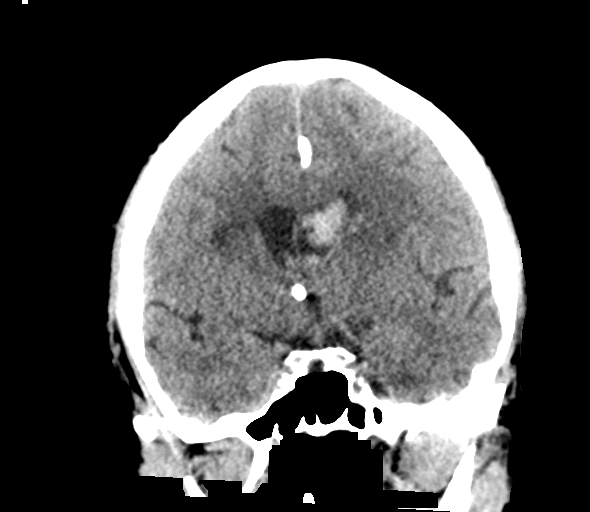

[Series 7: head without sag · sagittal · non-contrast · 0.35mm/px · 3 of 52 slices shown]
[im 18/52  brain]
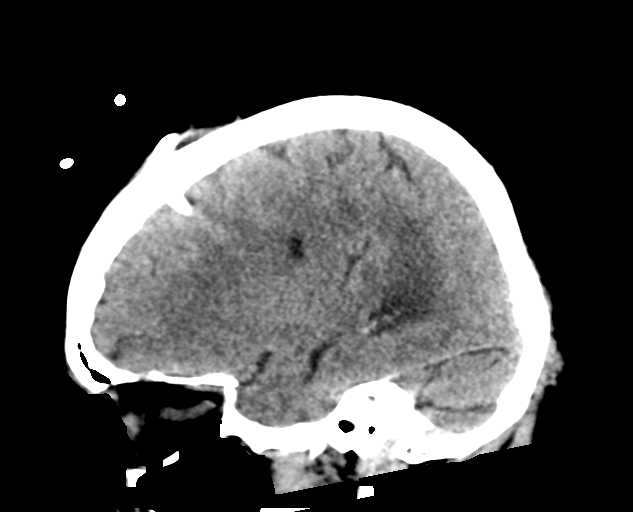
[im 26/52  brain]
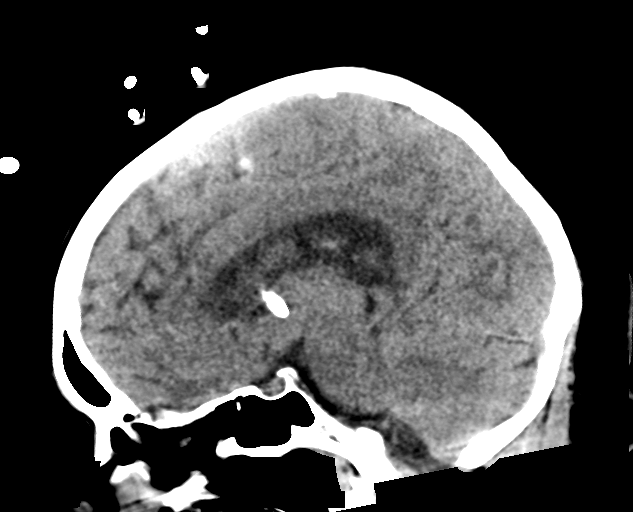
[im 35/52  brain]
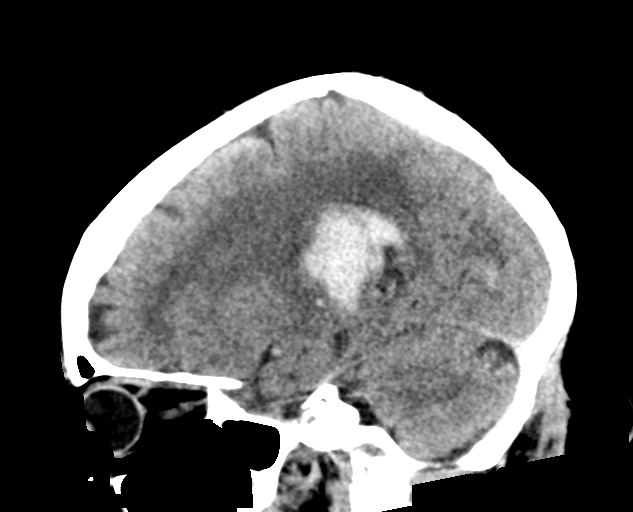

[15 of 47 positions shown; findings below may reference images not displayed]

FINDINGS: Brain: Left thalamic hematoma is unchanged in size and shape,
measuring up to 4.2 x 3.1 cm on axial slices. Intraventricular clot
has decreased in thickness at the level of the lateral ventricles.
No increased ventricular size. Chronic small vessel ischemia in the
hemispheric white matter. Negative for cortical infarct. Chronic
perforator infarct at the right corona radiata. Stable positioning
of EVD which traverses the frontal horn of the right lateral
ventricle.

Vascular: Negative

Skull: Negative

Sinuses/Orbits: Unremarkable
IMPRESSION: 1. No recurrent hemorrhage.  Decreasing intraventricular clot.
2. Stable EVD positioning with unremarkable ventricular size.

## 2021-05-21 MED ORDER — INSULIN ASPART 100 UNIT/ML IJ SOLN
0.0000 [IU] | INTRAMUSCULAR | Status: DC
  Administered 2021-05-21 – 2021-05-26 (×23): 1 [IU] via SUBCUTANEOUS

## 2021-05-21 MED ORDER — LABETALOL HCL 100 MG PO TABS
100.0000 mg | ORAL_TABLET | Freq: Three times a day (TID) | ORAL | Status: DC
  Administered 2021-05-21 – 2021-05-22 (×4): 100 mg
  Filled 2021-05-21 (×4): qty 1

## 2021-05-21 NOTE — Progress Notes (Signed)
Physical Therapy Treatment Patient Details Name: Marissa Morris MRN: [id number] DOB: 08-29-1961 Today's Date: 05/21/2021    History of Present Illness Marissa Morris is a 60 y.o. female presenting to [facility] in transfer from [facility] for management of acute intracranial hemorrhage. 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 6/3 interval placement of R frontal EVD. No significant PMH.    PT Comments    Pt continues to display R side inattention, limiting her awareness of midline and her R-sided limbs. Pt benefits from visual and tactile cues to find midline sitting EOB. However, once standing, she displays a very strong R lateral lean. She would benefit from attempting utilization of the stedy again to encourage more equal bil lower extremity weight bearing and midline orientation in this position. Pt needing modAx2 for bed mobility and sit > stand transfers and maxAx2 to stand pivot to the L this date. Will continue to follow acutely. Current recommendations remain appropriate.   Follow Up Recommendations  CIR     Equipment Recommendations  3in1 (PT);Wheelchair (measurements PT);Wheelchair cushion (measurements PT)    Recommendations for Other Services       Precautions / Restrictions Precautions Precautions: Fall;Other (comment) Precaution Comments: EVD, SBP < 140, L mitten, NGT Restrictions Weight Bearing Restrictions: No    Mobility  Bed Mobility Overal bed mobility: Needs Assistance Bed Mobility: Supine to Sit     Supine to sit: +2 for physical assistance;Mod assist     General bed mobility comments: Pt initiating well, exiting towards left side of the bed, bringing L leg off, assist for R leg and trunk to upright. Cued pt to grab L rail with L UE to assist with trunk ascension, success.    Transfers Overall transfer level: Needs assistance Equipment used: 2 person hand held assist Transfers: Sit to/from [employer] Sit to Stand: +2 physical assistance;+2 safety/equipment;Mod assist Stand pivot transfers: Max assist;+2 physical assistance;+2 safety/equipment       General transfer comment: L knee and ankle block for safety, needing modAx2 to power up to stand and steady with bil HHA. Pt with strong R lateral lean, needing maxAx2 to stand pivot to L.  Ambulation/Gait                 Stairs             Wheelchair Mobility    Modified Rankin (Stroke Patients Only) Modified Rankin (Stroke Patients Only) Pre-Morbid Rankin Score: No symptoms Modified Rankin: Severe disability     Balance Overall balance assessment: Needs assistance Sitting-balance support: Feet supported;Single extremity supported Sitting balance-Leahy Scale: Poor Sitting balance - Comments: Pt requiring min guard-modA, providing visual and tactile cues to match her body with therapist anterior to her to find and maintain midline, noted sucesss. Pt able to hold midline for up to ~5 sec before needing cues to find it again. x1 LOB posteriorly with maxA to catch and recover. Postural control: Posterior lean;Right lateral lean Standing balance support: During functional activity;Bilateral upper extremity supported Standing balance-Leahy Scale: Poor Standing balance comment: reliant on external support of mod-maxAx2.                            Cognition Arousal/Alertness: Lethargic Behavior During Therapy: WFL for tasks assessed/performed Overall Cognitive Status: Difficult to assess  General Comments: Pt verbalizing 1x, but unable to hear what she said. Pt answering yes/no questions with head gestures. Pt following simple multi-modal commands inconsistently with L side, with inattention to R. Pt lethargic, needing stimulation to keep eyes open when supine. Benefits from visual cues to find midline.      Exercises      General Comments General  comments (skin integrity, edema, etc.): Attempted to facilitate R attention via cuing pt to track therapist or hand with her eyes and rotate her neck, needing physical assistance to initiate rotation of neck; positioned pt in recliner turned slightly to L so TV was on R to encourage pt to look towards her R; encouraged pt to visible look at R limbs when cued to move them      Pertinent Vitals/Pain Pain Assessment: Faces Faces Pain Scale: No hurt Pain Intervention(s): Monitored during session    Home Living                      Prior Function            PT Goals (current goals can now be found in the care plan section) Acute Rehab PT Goals Patient Stated Goal: to get into the recliner PT Goal Formulation: With patient/family Time For Goal Achievement: 05/30/21 Potential to Achieve Goals: Good Progress towards PT goals: Progressing toward goals    Frequency    Min 4X/week      PT Plan Current plan remains appropriate    Co-evaluation              AM-PAC PT "6 Clicks" Mobility   Outcome Measure  Help needed turning from your back to your side while in a flat bed without using bedrails?: A Little Help needed moving from lying on your back to sitting on the side of a flat bed without using bedrails?: A Lot Help needed moving to and from a bed to a chair (including a wheelchair)?: Total Help needed standing up from a chair using your arms (e.g., wheelchair or bedside chair)?: Total Help needed to walk in hospital room?: Total Help needed climbing 3-5 steps with a railing? : Total 6 Click Score: 9    End of Session Equipment Utilized During Treatment: Gait belt Activity Tolerance: Patient tolerated treatment well Patient left: with call bell/phone within reach;in chair;with chair alarm set;with restraints reapplied Nurse Communication: Mobility status;Need for lift equipment PT Visit Diagnosis: Unsteadiness on feet (R26.81);Hemiplegia and hemiparesis;Muscle  weakness (generalized) (M62.81);Difficulty in walking, not elsewhere classified (R26.2);Other symptoms and signs involving the nervous system (R29.898) Hemiplegia - Right/Left: Right Hemiplegia - dominant/non-dominant: Dominant Hemiplegia - caused by: Other Nontraumatic intracranial hemorrhage     Time: 1435-1501 PT Time Calculation (min) (ACUTE ONLY): 26 min  Charges:  $Therapeutic Activity: 8-22 mins $Neuromuscular Re-education: 8-22 mins                     Raymond Gurney, PT, DPT Acute Rehabilitation Services  Pager: [phone] Office: [phone]    Marissa Morris 05/21/2021, 3:14 PM

## 2021-05-21 NOTE — Progress Notes (Signed)
Subjective: Patient more sleepy this morning  Objective: Vital signs in last 24 hours: Temp:  [98.2 F (36.8 C)-101.3 F (38.5 C)] 101.3 F (38.5 C) (06/09 0800) Pulse Rate:  [73-89] 74 (06/09 1100) Resp:  [12-17] 17 (06/09 1100) BP: (103-182)/(61-104) 109/61 (06/09 1100) SpO2:  [90 %-98 %] 94 % (06/09 1100)  Intake/Output from previous day: 06/08 0701 - 06/09 0700 In: 1580 [I.V.:140; NG/GT:1440] Out: 839 [Urine:800; Drains:39] Intake/Output this shift: Total I/O In: 120 [NG/GT:120] Out: 10 [Drains:10]  Eyes open to deep stim Follows commands intermittently on left Nonverbal Dark blood tinged CSF EVD output 39 ml over 24 hrs  Lab Results: Recent Labs    05/20/21 0532 05/21/21 0259  WBC 12.8* 12.0*  HGB 14.8 13.8  HCT 44.5 42.3  PLT 201 202   BMET Recent Labs    05/20/21 0532 05/21/21 0259  NA 136 135  K 3.8 3.8  CL 98 97*  CO2 27 25  GLUCOSE 138* 158*  BUN 24* 32*  CREATININE 0.70 0.78  CALCIUM 8.4* 8.3*    Studies/Results: CT HEAD WO CONTRAST  Result Date: 05/21/2021 CLINICAL DATA:  Stroke follow-up EXAM: CT HEAD WITHOUT CONTRAST TECHNIQUE: Contiguous axial images were obtained from the base of the skull through the vertex without intravenous contrast. COMPARISON:  Three days ago FINDINGS: Brain: Left thalamic hematoma is unchanged in size and shape, measuring up to 4.2 x 3.1 cm on axial slices. Intraventricular clot has decreased in thickness at the level of the lateral ventricles. No increased ventricular size. Chronic small vessel ischemia in the hemispheric white matter. Negative for cortical infarct. 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 Stable positioning of EVD which traverses the frontal horn of the right lateral ventricle. Vascular: Negative Skull: Negative Sinuses/Orbits: Unremarkable IMPRESSION: 1. No recurrent hemorrhage.  Decreasing intraventricular clot. 2. Stable EVD positioning with unremarkable ventricular size.  Electronically Signed   By: Marnee Spring M.D.   On: 05/21/2021 10:57    Assessment/Plan: Thalamic ICH with IVH - no change in ventricle size to explain patient's increased somnolence.  Nevertheless, will hold off on planned EVD clamp trial for now.  Cont EVD .    LOS: 7 days     Bedelia Person 05/21/2021, 11:02 AM

## 2021-05-21 NOTE — Progress Notes (Signed)
NAMEQuynn Morris, MRN:  [id number], DOB:  06-29-1961, LOS: 7 ADMISSION DATE:  05/13/2021, CONSULTATION DATE:  05/14/2021 REFERRING MD:  Stroke team CHIEF COMPLAINT: Intracranial Hemorrhage  HPI/Hospital Course  60 year old female with PMHx significant for HTN, HLD and thyroid disease who presented to [facility] after transfer from [facility] ([place]) for management of acute ICH.    Per notes/records from [place], EMS was called to the patient's home and on arrival the patient was found to be slumped over in a chair. She projectile vomited and then was noted to be flaccid on the right with right facial droop, was very somnolent and unable to answer questions coherently. On arrival to the ED, BP was 222/128. CT Head revealed 3cm L thalamic hemorrhage with intraventricular extension.    Initial NIHSS was 25. Initial exam by EDP at OSH: Patient awake with slow but comprehensible speech. Oriented to person. Confused. CN were normal. LUE and LLE 5/5, RUE and RLE with flaccid paralysis. Follow up NIHSS was 17.   Past Medical History  HTN, HLD and thyroid dysfunction (unclear if hypothyroid or hyperthyroid)  Imaging:  6/2 Carotid dopplers > 1-39% stenosis  6/2 CT H > L thalamic ICH with ventricular extension  6/3 CT H > Interval placement of R frontal EVD  6/2 ECHO > LVEH 50-55% mild LVH. Grade II diastolic dysfunction.  6/6 CT H > Stable hemorrhage, mass effect, EVD. Less blood in 3rd and 4th ventricles.   Interim History/Subjective:  No acute issues overnight  Blood pressure elevated at night  Lethargic this am   Objective   Blood pressure (!) 153/94, pulse 89, temperature 99.3 F (37.4 C), temperature source Axillary, resp. rate 12, weight 77.7 kg, SpO2 96 %.        Intake/Output Summary (Last 24 hours) at 05/21/2021 0703 Last data filed at 05/21/2021 0600 Gross per 24 hour  Intake 1519.96 ml  Output 835 ml  Net 684.96 ml    Filed Weights   05/13/21 2256 05/17/21 0400  05/18/21 0500  Weight: 75.3 kg 80.9 kg 77.7 kg   Physical Examination: General: Acute on chronic ill appearing middle aged female lying in bed iin NAD HEENT: ETT, MM pink/moist, PERRL, EVD in place  Neuro: Lethargic this AM, unable to follow commands for me,  CV: s1s2 regular rate and rhythm, no murmur, rubs, or gallops,  PULM:  Clear to ascultation bilaterally, no added breath sounds, no increased work of breathing  GI: soft, bowel sounds active in all 4 quadrants, non-tender, non-distended, tolerating TF  Extremities: warm/dry, no edema  Skin: no rashes or lesions  Resolved problems:  Hypertensive emergency   Assessment & Plan:    L thalamic ICH with IVH and hydrocephalus, s/p R frontal EVD -In the setting of hypertensive emergency (SBP 220 on arrival). S/p EVD with NSGY.  P: Management per neurology  EVD per neuro  Maintain neuro protective measures; goal for eurothermia, euglycemia, eunatermia, normoxia, and PCO2 goal of 35-40 Nutrition and bowel regiment  Seizure precautions  AEDs per neurology  Aspirations precautions  SBP goal less than 160 Consider repeat head CT, per neuro   HTN -Hypertensive emergency with resultant ICH. Longstanding history of HTN P: BP controlled on Amlodipine, Lisinopril, Coreg,  HCTZ added 6/10 Cleviprex stopped 6/8 Continuous telemetry    Dysphagia P: SLP following recommend NPO  Continue TF via Cortrak  Plans to preform FEES when mentation allows  CIR consult pending   HLD P: Consider statin at D/C  Vaginal discharge Leukocytosis - Vaginal swab/culture sent 6/7 and negative  P: Remains on empiric doxy and flagyl consider treatment x7 days  Trend CBC and fever curve  Monitor for continues symptoms    Best Practice (evaluated daily):  Diet:  Tube Feed  Pain/Anxiety/Delirium protocol (if indicated): No VAP protocol (if indicated): Not indicated DVT prophylaxis: Contraindicated  - ICH GI prophylaxis: PPI Glucose control:   SSI No Central venous access:  N/A Arterial line:  N/A Foley:  N/A Mobility:  OOB  PT consulted: Yes - CIR consult pending Last date of multidisciplinary goals of care discussion [6/5] Code Status:  full code Disposition: ICU - possible dispo to CIR, consult pending  Critical care time:    Performed by: Delfin Gant  Total critical care time: 37 minutes  Critical care time was exclusive of separately billable procedures and treating other patients.  Critical care was necessary to treat or prevent imminent or life-threatening deterioration.  Critical care was time spent personally by me on the following activities: development of treatment plan with patient and/or surrogate as well as nursing, discussions with consultants, evaluation of patient's response to treatment, examination of patient, obtaining history from patient or surrogate, ordering and performing treatments and interventions, ordering and review of laboratory studies, ordering and review of radiographic studies, pulse oximetry and re-evaluation of patient's condition.  Delfin Gant, NP-C [facility] Personal contact information can be found on Amion  05/21/2021, 7:16 AM

## 2021-05-21 NOTE — Progress Notes (Signed)
Inpatient Rehabilitation Admissions Coordinator   Met with fiance, Clare Gandy, at bedside and spoke with her sister, Vaughan Basta by phone. We await further progress with therapy and clamping of EVD before pursuing insurance Auth for  possible Cir admit.  Danne Baxter, RN, MSN Rehab Admissions Coordinator [phone] 05/21/2021 11:48 AM

## 2021-05-21 NOTE — Progress Notes (Signed)
SLP Cancellation Note  Patient Details Name: Marissa Morris MRN: [id number] DOB: December 15, 1960   Cancelled treatment:        Spoke with RN and read notes that her alertness is not sufficient for instrumental swallow assessment at present. Will follow.   Royce Macadamia 05/21/2021, 1:01 PM.lls

## 2021-05-21 NOTE — Progress Notes (Addendum)
STROKE TEAM PROGRESS NOTE   SUBJECTIVE (INTERVAL HISTORY) No family at bedside. Patient is resting in bed. She is drowsy on examination today and it takes a moment for her to rouse and participate in the physical examination.  Given drowsiness repeat CTH ordered- this was stable with decreasing intraventricular clot, unremarkable ventricle size.   OBJECTIVE Temp:  [98.2 F (36.8 C)-101.3 F (38.5 C)] 100.3 F (37.9 C) (06/09 1200) Pulse Rate:  [72-89] 72 (06/09 1300) Cardiac Rhythm: Normal sinus rhythm (06/08 2000) Resp:  [12-18] 13 (06/09 1300) BP: (103-182)/(61-104) 133/80 (06/09 1300) SpO2:  [90 %-96 %] 96 % (06/09 1300)  Recent Labs  Lab 05/20/21 2005 05/21/21 0002 05/21/21 0404 05/21/21 0913 05/21/21 1219  GLUCAP 149* 165* 170* 189* 152*   Recent Labs  Lab 05/15/21 1340 05/15/21 1735 05/16/21 0605 05/16/21 1659 05/17/21 0318 05/18/21 0444 05/19/21 0347 05/20/21 0532 05/21/21 0259  NA 136  --  136  --  139 138 137 136 135  K 3.7  --  3.8  --  3.7 3.7 4.0 3.8 3.8  CL 104  --  101  --  101 100 99 98 97*  CO2 22  --  24  --  25 29 28 27 25   GLUCOSE 93  --  163*  --  119* 105* 148* 138* 158*  BUN 10  --  12  --  21* 25* 26* 24* 32*  CREATININE 0.64  --  0.74  --  0.92 0.82 0.81 0.70 0.78  CALCIUM 8.2*  --  8.3*  --  8.6* 8.4* 8.6* 8.4* 8.3*  MG 2.1 2.3 2.5* 2.4  --   --   --   --  2.6*  PHOS 3.8 3.4 3.6 4.3  --   --   --   --  4.2   No results for input(s): AST, ALT, ALKPHOS, BILITOT, PROT, ALBUMIN in the last 168 hours.  Recent Labs  Lab 05/17/21 0318 05/18/21 0444 05/19/21 0347 05/20/21 0532 05/21/21 0259  WBC 12.9* 10.9* 11.0* 12.8* 12.0*  HGB 15.0 14.1 14.1 14.8 13.8  HCT 44.8 43.7 43.6 44.5 42.3  MCV 95.5 98.9 97.5 97.8 97.0  PLT 201 184 201 201 202   No results for input(s): CKTOTAL, CKMB, CKMBINDEX, TROPONINI in the last 168 hours. No results for input(s): LABPROT, INR in the last 72 hours. No results for input(s): COLORURINE, LABSPEC, PHURINE,  GLUCOSEU, HGBUR, BILIRUBINUR, KETONESUR, PROTEINUR, UROBILINOGEN, NITRITE, LEUKOCYTESUR in the last 72 hours.  Invalid input(s): APPERANCEUR     Component Value Date/Time   CHOL 203 (H) 05/14/2021 0022   TRIG 127 05/20/2021 0532   HDL 68 05/14/2021 0022   CHOLHDL 3.0 05/14/2021 0022   VLDL 14 05/14/2021 0022   LDLCALC 121 (H) 05/14/2021 0022   Lab Results  Component Value Date   HGBA1C 5.7 (H) 05/14/2021      Component Value Date/Time   LABOPIA NONE DETECTED 05/14/2021 0254   COCAINSCRNUR NONE DETECTED 05/14/2021 0254   LABBENZ NONE DETECTED 05/14/2021 0254   AMPHETMU NONE DETECTED 05/14/2021 0254   THCU NONE DETECTED 05/14/2021 0254   LABBARB NONE DETECTED 05/14/2021 0254    No results for input(s): ETH in the last 168 hours.  I have personally reviewed the radiological images below and agree with the radiology interpretations.  DG Abd 1 View  Result Date: 05/14/2021 CLINICAL DATA:  60 year old female with intracranial hemorrhage. EXAM: ABDOMEN - 1 VIEW COMPARISON:  Portable pelvis radiograph today reported separately. FINDINGS: Portable AP supine  view at 0344 hours. Non obstructed bowel gas pattern. No definite pneumoperitoneum on this supine view. Minimally included left lung base appears grossly negative. Scoliosis and degenerative changes in the spine. Pelvic dystrophic and/or vascular calcifications, mild. No acute osseous abnormality identified. IMPRESSION: 1. Normal bowel gas pattern. 2. Spinal scoliosis and degeneration. Electronically Signed   By: Odessa Fleming M.D.   On: 05/14/2021 06:19   CT HEAD WO CONTRAST  Result Date: 05/21/2021 CLINICAL DATA:  Stroke follow-up EXAM: CT HEAD WITHOUT CONTRAST TECHNIQUE: Contiguous axial images were obtained from the base of the skull through the vertex without intravenous contrast. COMPARISON:  Three days ago FINDINGS: Brain: Left thalamic hematoma is unchanged in size and shape, measuring up to 4.2 x 3.1 cm on axial slices. Intraventricular  clot has decreased in thickness at the level of the lateral ventricles. No increased ventricular size. Chronic small vessel ischemia in the hemispheric white matter. Negative for cortical infarct. 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 Stable positioning of EVD which traverses the frontal horn of the right lateral ventricle. Vascular: Negative Skull: Negative Sinuses/Orbits: Unremarkable IMPRESSION: 1. No recurrent hemorrhage.  Decreasing intraventricular clot. 2. Stable EVD positioning with unremarkable ventricular size. Electronically Signed   By: Marnee Spring M.D.   On: 05/21/2021 10:57   CT HEAD WO CONTRAST  Result Date: 05/18/2021 CLINICAL DATA:  60 year old female with left thalamic hemorrhage, IVH, EVD. EXAM: CT HEAD WITHOUT CONTRAST TECHNIQUE: Contiguous axial images were obtained from the base of the skull through the vertex without intravenous contrast. COMPARISON:  Head CT 05/15/2021 and earlier. FINDINGS: Brain: Right superior frontal approach EVD with catheter terminating at the 3rd ventricle and communicating with the right lateral ventricle on coronal image 35. The 3rd and 4th ventricles remain relatively decompressed. There is less 3rd and 4th ventricular IVH. Lateral ventricle size also mildly decreased since 05/15/2021, although with moderate residual lateral ventricular blood greater on the left. Oval left thalamic intra-axial hemorrhage encompasses 34 x 22 by 33 mm now (AP by transverse by CC) for an estimated blood volume of 12 mL, and has not changed since 05/15/2021. Regional edema and mild regional mass effect are stable. Stable gray-white matter differentiation elsewhere with Patchy and confluent white matter hypodensity. No new cortically based infarct identified. Basilar cisterns are patent. No significant midline shift. Vascular: Calcified atherosclerosis at the skull base. Skull: Stable right superior burr hole. Sinuses/Orbits: Visualized paranasal sinuses and  mastoids are stable and well aerated. Other: Right nasoenteric tube in place. Mild postoperative changes to the scalp vertex. Disconjugate gaze but otherwise negative orbits soft tissues. IMPRESSION: 1. Stable left thalamic intra-axial hemorrhage size since 05/15/2021. Stable regional edema and mild regional mass effect. 2. Stable right frontal approach EVD with slightly decreased lateral ventricle size since 05/15/2021. Moderate lateral ventricle IVH persists. There is less blood in the small 3rd and 4th ventricles now. 3. Underlying chronic small vessel disease. No new intracranial abnormality. Electronically Signed   By: Odessa Fleming M.D.   On: 05/18/2021 06:31   CT HEAD WO CONTRAST  Result Date: 05/15/2021 CLINICAL DATA:  Cerebral hemorrhage suspected. EXAM: CT HEAD WITHOUT CONTRAST TECHNIQUE: Contiguous axial images were obtained from the base of the skull through the vertex without intravenous contrast. COMPARISON:  MRI and CT of the brain May 14, 2021. FINDINGS: Brain: No significant interval change size of the left thalamic hemorrhage with hematoma now measuring approximately 3.5 x 3.3 x 2 cm compared to 3.3 x 3.3 x 2.2 cm on  prior. Persistent hypodensity surrounding the hematoma related to vasogenic edema. Supratentorial and infratentorial intraventricular hemorrhage related to hematoma decompression into the right lateral ventricle, status post right frontal approach ventricular drain placement. Drain tip is at level of the right foramen of Monro. There is mild interval decrease in size of the supratentorial ventricles, particularly the temporal horns. Patchy hypodensity of the white matter of the cerebral hemispheres, nonspecific most likely related to small vessel ischemia. Remote lacunar infarct in the right radiata. Vascular: No hyperdense vessel or unexpected calcification. Skull: Negative for fracture or focal lesion. Sinuses/Orbits: No acute finding. Other: None. IMPRESSION: 1. Interval placement of a  right frontal approach ventricular drain with the tip at the level of the right foramen of Monro. Interval mild decrease in size of the ventricular system, particularly the temporal horns of the lateral ventricles. 2. Stable appearance of left thalamic intraparenchymal hemorrhage with intraventricular extension. Electronically Signed   By: Baldemar Lenis M.D.   On: 05/15/2021 09:41   CT HEAD WO CONTRAST  Result Date: 05/14/2021 CLINICAL DATA:  Intraparenchymal hemorrhage.  Follow-up. EXAM: CT HEAD WITHOUT CONTRAST TECHNIQUE: Contiguous axial images were obtained from the base of the skull through the vertex without intravenous contrast. COMPARISON:  Same day CT head. FINDINGS: Mildly motion limited exam.  Within this limitation: Brain: When accounting for differences in technique, similar size of the acute intraparenchymal hemorrhage centered in the left thalamus which measures up to 3.3 x 2.2 x 3.3 cm (similar when remeasured on the prior). Similar regional mass effect. Similar intraventricular hemorrhage within the left greater than right lateral ventricles, third ventricle, and fourth ventricle. Similar ventriculomegaly with mild periventricular edema and prominence of the temporal horns. Additional scattered white matter hypoattenuation is nonspecific but most likely related to chronic microvascular ischemic disease. No evidence of acute large vascular territory infarct. Similar suspected prior infarct in the right corona radiata. Vascular: No hyperdense vessel identified. Calcific atherosclerosis. Skull: No acute fracture. Sinuses/Orbits: No acute findings. Other: No mastoid effusions. IMPRESSION: When accounting for differences in technique, similar size of the acute left thalamic intraparenchymal hemorrhage. Similar intraventricular extension of hemorrhage and ventriculomegaly. Electronically Signed   By: Feliberto Harts MD   On: 05/14/2021 06:21   CT HEAD WO CONTRAST  Result Date:  05/14/2021 CLINICAL DATA:  Intraparenchymal hemorrhage. Follow-up of scans performed at [place], [place] [facility]. EXAM: CT HEAD WITHOUT CONTRAST TECHNIQUE: Contiguous axial images were obtained from the base of the skull through the vertex without intravenous contrast. COMPARISON:  None. I am not able to view the outside institution scans. FINDINGS: Brain: Intraparenchymal hematoma centered in the left thalamus measures 3.2 x 2.2 x 3.0 cm (volume = 11 cm^3). There is moderate hydrocephalus with periventricular hypoattenuation suggesting transependymal interstitial edema. There is rightward bulging of the septum pellucidum. Large amount of blood in the ventricles. Vascular: No abnormal hyperdensity of the major intracranial arteries or dural venous sinuses. No intracranial atherosclerosis. Skull: The visualized skull base, calvarium and extracranial soft tissues are normal. Sinuses/Orbits: No fluid levels or advanced mucosal thickening of the visualized paranasal sinuses. No mastoid or middle ear effusion. The orbits are normal. IMPRESSION: 1. Intraparenchymal hematoma centered in the left thalamus with intraventricular extension and moderate hydrocephalus with transependymal interstitial edema. Critical Value/emergent results were called by telephone at the time of interpretation on 05/14/2021 at 12:58 am to provider ERIC [facility] , who verbally acknowledged these results. Electronically Signed   By: Deatra Robinson M.D.   On: 05/14/2021 00:58  MR MRA HEAD WO CONTRAST  Result Date: 05/14/2021 CLINICAL DATA:  Intracranial hemorrhage EXAM: MRI HEAD WITHOUT CONTRAST MRA HEAD WITHOUT CONTRAST TECHNIQUE: Multiplanar, multi-echo pulse sequences of the brain and surrounding structures were acquired without intravenous contrast. Angiographic images of the Circle of Willis were acquired using MRA technique without intravenous contrast. COMPARISON: No pertinent prior exam. COMPARISON:  Correlation made with prior head CT  FINDINGS: MRI HEAD Brain: Left thalamic hemorrhage is again identified similar to the recent CT with acute blood products. There is mild surrounding edema. Significant intraventricular extension is present as seen on CT with resulting hydrocephalus. Focus susceptibility in the right corona radiata reflects chronic microhemorrhage associated with a lacunar infarct. Additional patchy and confluent areas of T2 hyperintensity in the supratentorial white matter are nonspecific but probably reflect moderate chronic microvascular ischemic changes. No post contrast imaging performed therefore potential underlying lesion is not well evaluated. Vascular: Major vessel flow voids at the skull base are preserved. Skull and upper cervical spine: Normal marrow signal is preserved. Sinuses/Orbits: Paranasal sinuses are aerated. Orbits are unremarkable. Other: Sella is unremarkable.  Mastoid air cells are clear. MRA HEAD Intracranial internal carotid arteries are patent. Middle and anterior cerebral arteries are patent. Intracranial vertebral arteries, basilar artery, posterior cerebral arteries are patent. There is no significant stenosis or aneurysm. No abnormal flow related enhancement in the region of thalamic hemorrhage. IMPRESSION: No substantial change in left thalamic hemorrhage with significant intraventricular extension and hydrocephalus as well as mild edema and mass effect compared to recent CT. 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 Moderate chronic microvascular ischemic changes. Unremarkable MRA. Electronically Signed   By: Guadlupe Spanish M.D.   On: 05/14/2021 11:18   MR BRAIN WO CONTRAST  Result Date: 05/14/2021 CLINICAL DATA:  Intracranial hemorrhage EXAM: MRI HEAD WITHOUT CONTRAST MRA HEAD WITHOUT CONTRAST TECHNIQUE: Multiplanar, multi-echo pulse sequences of the brain and surrounding structures were acquired without intravenous contrast. Angiographic images of the  Circle of Willis were acquired using MRA technique without intravenous contrast. COMPARISON: No pertinent prior exam. COMPARISON:  Correlation made with prior head CT FINDINGS: MRI HEAD Brain: Left thalamic hemorrhage is again identified similar to the recent CT with acute blood products. There is mild surrounding edema. Significant intraventricular extension is present as seen on CT with resulting hydrocephalus. Focus susceptibility in the right corona radiata reflects chronic microhemorrhage associated with a lacunar infarct. Additional patchy and confluent areas of T2 hyperintensity in the supratentorial white matter are nonspecific but probably reflect moderate chronic microvascular ischemic changes. No post contrast imaging performed therefore potential underlying lesion is not well evaluated. Vascular: Major vessel flow voids at the skull base are preserved. Skull and upper cervical spine: Normal marrow signal is preserved. Sinuses/Orbits: Paranasal sinuses are aerated. Orbits are unremarkable. Other: Sella is unremarkable.  Mastoid air cells are clear. MRA HEAD Intracranial internal carotid arteries are patent. Middle and anterior cerebral arteries are patent. Intracranial vertebral arteries, basilar artery, posterior cerebral arteries are patent. There is no significant stenosis or aneurysm. No abnormal flow related enhancement in the region of thalamic hemorrhage. IMPRESSION: No substantial change in left thalamic hemorrhage with significant intraventricular extension and hydrocephalus as well as mild edema and mass effect compared to recent CT. 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 Moderate chronic microvascular ischemic changes. Unremarkable MRA. Electronically Signed   By: Guadlupe Spanish M.D.   On: 05/14/2021 11:18   DG Pelvis Portable  Result Date: 05/14/2021 CLINICAL DATA:  60 year old female with intracranial hemorrhage. EXAM: PORTABLE PELVIS 1-2  VIEWS COMPARISON:  None. FINDINGS: Portable AP view at 0346 hours. Mild likely fibroid associated dystrophic calcifications in the pelvis eccentric to the right. Femoral heads normally located. Pelvis and proximal femurs appear intact. No acute osseous abnormality identified. Negative visible bowel gas pattern. IMPRESSION: Negative. Electronically Signed   By: Odessa FlemingH  Hall M.D.   On: 05/14/2021 06:18   DG CHEST PORT 1 VIEW  Result Date: 05/17/2021 CLINICAL DATA:  Respiratory failure EXAM: PORTABLE CHEST 1 VIEW COMPARISON:  May 14, 2021 FINDINGS: Feeding tube tip is below the diaphragm. No pneumothorax. There is no edema or airspace opacity. There is cardiomegaly with pulmonary vascularity normal. No adenopathy. No bone lesions. IMPRESSION: No edema or airspace opacity. Cardiomegaly, stable. Feeding tube tip below diaphragm. Electronically Signed   By: Bretta BangWilliam  Woodruff III M.D.   On: 05/17/2021 14:17   DG CHEST PORT 1 VIEW  Result Date: 05/14/2021 CLINICAL DATA:  Provided history: Follow-up. EXAM: PORTABLE CHEST 1 VIEW COMPARISON:  None. FINDINGS: Cardiomediastinal silhouette is enlarged, likely accentuated by AP technique. Calcific atherosclerosis of the aorta. Low lung volumes. No consolidation. EKG leads bilaterally. No visible pleural effusions or pneumothorax on this single semi erect AP radiograph. IMPRESSION: 1. Low lung volumes without evidence of acute cardiopulmonary disease. 2. Cardiomegaly. Electronically Signed   By: Feliberto HartsFrederick S Jones MD   On: 05/14/2021 06:13   DG Abd Portable 1V  Result Date: 05/15/2021 CLINICAL DATA:  Feeding tube placement. EXAM: PORTABLE ABDOMEN - 1 VIEW COMPARISON:  None. FINDINGS: Feeding tube is in place with the tip in the distal stomach directed toward the duodenum. IMPRESSION: As above. Electronically Signed   By: Drusilla Kannerhomas  Dalessio M.D.   On: 05/15/2021 11:40   ECHOCARDIOGRAM COMPLETE  Result Date: 05/16/2021    ECHOCARDIOGRAM REPORT   Patient Name:   Hassan RowanDELORIS Walck Date  of Exam: 05/14/2021 Medical Rec #:  [id number]    Height: Accession #:    [id number]   Weight:       166.0 lb Date of Birth:  May 15, 1961    BSA:          1.783 m Patient Age:    59 years     BP:           122/69 mmHg Patient Gender: F            HR:           77 bpm. Exam Location:  Inpatient Procedure: 2D Echo, Cardiac Doppler and Color Doppler Indications:    Stroke  History:        Patient has no prior history of Echocardiogram examinations.  Sonographer:    Ross LudwigArthur Guy RDCS (AE) Referring Phys: [phone] ERIC LINDZEN  Sonographer Comments: Patient not responsive to request to sniff for IVC collapse test. IMPRESSIONS  1. Left ventricular ejection fraction, by estimation, is 50 to 55%. The left ventricle has low normal function. The left ventricle has no regional wall motion abnormalities. There is moderate thickening of the septum. The rest of the LV segments demonstrate mild left ventricular hypertrophy. Left ventricular diastolic parameters are consistent with Grade II diastolic dysfunction (pseudonormalization). Elevated left ventricular end-diastolic pressure.  2. The average left ventricular global longitudinal strain is -6.7 %. The global longitudinal strain is abnormal. Strain pattern shows relative apical sparring which may be suggestive of amyloid in the right clinic context.  3. Right ventricular systolic function is normal. The right  ventricular size is normal.  4. The mitral valve is normal in structure. Trivial mitral valve regurgitation. No evidence of mitral stenosis.  5. The aortic valve is tricuspid. There is mild calcification of the aortic valve. There is mild thickening of the aortic valve. Aortic valve regurgitation is not visualized. Mild aortic valve sclerosis is present, with no evidence of aortic valve stenosis.  6. The inferior vena cava is dilated in size with <50% respiratory variability, suggesting right atrial pressure of 15 mmHg.  7. Left atrial size was mildly dilated. Comparison(s): No  prior Echocardiogram. Conclusion(s)/Recommendation(s): No intracardiac source of embolism detected on this transthoracic study. A transesophageal echocardiogram is recommended to exclude cardiac source of embolism if clinically indicated. FINDINGS  Left Ventricle: Left ventricular ejection fraction, by estimation, is 50 to 55%. The left ventricle has low normal function. The left ventricle has no regional wall motion abnormalities. The average left ventricular global longitudinal strain is -6.7 %.  The global longitudinal strain is abnormal. The left ventricular internal cavity size was normal in size. There is moderate thickening of the septum. The rest of the LV segments demonstrate mild left ventricular hypertrophy. Left ventricular diastolic parameters are consistent with Grade II diastolic dysfunction (pseudonormalization). Elevated left ventricular end-diastolic pressure. Right Ventricle: The right ventricular size is normal. No increase in right ventricular wall thickness. Right ventricular systolic function is normal. Left Atrium: Left atrial size was mildly dilated. Right Atrium: Right atrial size was normal in size. Pericardium: There is no evidence of pericardial effusion. Mitral Valve: The mitral valve is normal in structure. There is mild thickening of the mitral valve leaflet(s). There is mild calcification of the mitral valve leaflet(s). Mild mitral annular calcification. Trivial mitral valve regurgitation. No evidence  of mitral valve stenosis. MV peak gradient, 7.1 mmHg. The mean mitral valve gradient is 4.0 mmHg. Tricuspid Valve: The tricuspid valve is normal in structure. Tricuspid valve regurgitation is trivial. No evidence of tricuspid stenosis. Aortic Valve: The aortic valve is tricuspid. There is mild calcification of the aortic valve. There is mild thickening of the aortic valve. Aortic valve regurgitation is not visualized. Mild aortic valve sclerosis is present, with no evidence of aortic  valve stenosis. Aortic valve mean gradient measures 8.0 mmHg. Aortic valve peak gradient measures 16.3 mmHg. Aortic valve area, by VTI measures 2.05 cm. Pulmonic Valve: The pulmonic valve was normal in structure. Pulmonic valve regurgitation is trivial. No evidence of pulmonic stenosis. Aorta: The aortic root and ascending aorta are structurally normal, with no evidence of dilitation. Venous: The inferior vena cava is dilated in size with less than 50% respiratory variability, suggesting right atrial pressure of 15 mmHg. IAS/Shunts: No atrial level shunt detected by color flow Doppler.  LEFT VENTRICLE PLAX 2D LVIDd:         4.70 cm  Diastology LVIDs:         4.20 cm  LV e' medial:    5.55 cm/s LV PW:         1.60 cm  LV E/e' medial:  23.2 LV IVS:        1.80 cm  LV e' lateral:   6.31 cm/s LVOT diam:     1.80 cm  LV E/e' lateral: 20.4 LV SV:         69 LV SV Index:   39       2D Longitudinal Strain LVOT Area:     2.54 cm 2D Strain GLS Avg:     -6.7 %  RIGHT  VENTRICLE             IVC RV Basal diam:  3.50 cm     IVC diam: 2.30 cm RV S prime:     13.20 cm/s TAPSE (M-mode): 3.2 cm LEFT ATRIUM             Index       RIGHT ATRIUM           Index LA diam:        4.10 cm 2.30 cm/m  RA Area:     16.30 cm LA Vol (A2C):   42.1 ml 23.61 ml/m RA Volume:   45.00 ml  25.23 ml/m LA Vol (A4C):   43.6 ml 24.45 ml/m LA Biplane Vol: 47.3 ml 26.52 ml/m  AORTIC VALVE AV Area (Vmax):    1.86 cm AV Area (Vmean):   2.09 cm AV Area (VTI):     2.05 cm AV Vmax:           202.00 cm/s AV Vmean:          135.000 cm/s AV VTI:            0.336 m AV Peak Grad:      16.3 mmHg AV Mean Grad:      8.0 mmHg LVOT Vmax:         148.00 cm/s LVOT Vmean:        111.000 cm/s LVOT VTI:          0.271 m LVOT/AV VTI ratio: 0.81  AORTA Ao Root diam: 2.90 cm Ao Asc diam:  3.20 cm MITRAL VALVE MV Area (PHT): 3.08 cm     SHUNTS MV Area VTI:   1.83 cm     Systemic VTI:  0.27 m MV Peak grad:  7.1 mmHg     Systemic Diam: 1.80 cm MV Mean grad:  4.0 mmHg MV  Vmax:       1.33 m/s MV Vmean:      90.6 cm/s MV Decel Time: 246 msec MV E velocity: 129.00 cm/s MV A velocity: 118.00 cm/s MV E/A ratio:  1.09 Laurance Flatten MD Electronically signed by Laurance Flatten MD Signature Date/Time: 05/16/2021/7:51:23 AM    Final    VAS US CAROTID  Result Date: 05/15/2021 Carotid Arterial Duplex Study Patient Name:  CAMERIN JIMENEZ  Date of Exam:   05/14/2021 Medical Rec #: [id number]     Accession #:    [id number] Date of Birth: [birth year]     Patient Gender: F Patient Age:   59Y Exam Location:  [facility] Procedure:      VAS US CAROTID Referring Phys: [id number] Marvel Plan --------------------------------------------------------------------------------  Indications:       Hypertensive emergency (122/128). 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 Risk Factors:      Hypertension. Comparison Study:  No prior study on file Performing Technologist: Sherren Kerns RVS  Examination Guidelines: A complete evaluation includes B-mode imaging, spectral Doppler, color Doppler, and power Doppler as needed of all accessible portions of each vessel. Bilateral testing is considered an integral part of a complete examination. Limited examinations for reoccurring indications may be performed as noted.  Right Carotid Findings: +----------+--------+--------+--------+------------------+--------+           PSV cm/sEDV cm/sStenosisPlaque DescriptionComments +----------+--------+--------+--------+------------------+--------+ CCA Prox  96      9                                          +----------+--------+--------+--------+------------------+--------+  CCA Distal104     13                                         +----------+--------+--------+--------+------------------+--------+ ICA Prox  66      15      1-39%   heterogenous               +----------+--------+--------+--------+------------------+--------+ ICA Distal54      13                                          +----------+--------+--------+--------+------------------+--------+ ECA       151     22                                         +----------+--------+--------+--------+------------------+--------+ +----------+--------+-------+--------+-------------------+           PSV cm/sEDV cmsDescribeArm Pressure (mmHG) +----------+--------+-------+--------+-------------------+ Subclavian150                                        +----------+--------+-------+--------+-------------------+ +---------+--------+--+--------+--+---------+ VertebralPSV cm/s41EDV cm/s12Antegrade +---------+--------+--+--------+--+---------+  Left Carotid Findings: +----------+--------+--------+--------+------------------+------------------+           PSV cm/sEDV cm/sStenosisPlaque DescriptionComments           +----------+--------+--------+--------+------------------+------------------+ CCA Prox  180     15                                                   +----------+--------+--------+--------+------------------+------------------+ CCA Distal98      13                                intimal thickening +----------+--------+--------+--------+------------------+------------------+ ICA Prox  70      22      1-39%                     intimal thickening +----------+--------+--------+--------+------------------+------------------+ ICA Distal68      22                                                   +----------+--------+--------+--------+------------------+------------------+ ECA       91      18                                                   +----------+--------+--------+--------+------------------+------------------+ +----------+--------+--------+--------+-------------------+           PSV cm/sEDV cm/sDescribeArm Pressure (mmHG) +----------+--------+--------+--------+-------------------+ [id number]                                           +----------+--------+--------+--------+-------------------+ +---------+--------+--+--------+--+---------+  VertebralPSV cm/s61EDV cm/s13Antegrade +---------+--------+--+--------+--+---------+   Summary: Right Carotid: Velocities in the right ICA are consistent with a 1-39% stenosis. Left Carotid: Velocities in the left ICA are consistent with a 1-39% stenosis. Vertebrals: Bilateral vertebral arteries demonstrate antegrade flow. *See table(s) above for measurements and observations.  Electronically signed by Marvel Plan MD on 05/15/2021 at 2:54:40 PM.    Final       PHYSICAL EXAM Middle-aged African-American lady not in distress Temp:  [98.2 F (36.8 C)-101.3 F (38.5 C)] 100.3 F (37.9 C) (06/09 1200) Pulse Rate:  [72-89] 72 (06/09 1300) Resp:  [12-18] 13 (06/09 1300) BP: (103-182)/(61-104) 133/80 (06/09 1300) SpO2:  [90 %-96 %] 96 % (06/09 1300) Cardiovascular - Regular rhythm and rate. Neurological Exam  -awake and alert but continues to have expressive greater than receptive aphasia and speaks only occasional words.  Cannot speak sentences.  Follow some simple commands, ft gaze preference and barely cross midline, not blinking to visual threat consistently, PERRL. Right facial droop. Tongue protrusion not cooperative. Spontaneously moving LUE against gravity, no drift, flaccid on the RUE however with pain, bicep 3-/5. Withdraw to pain LLE 3/5, but only mild withdraw 2/5 at RLE. Sensation, coordination not cooperative and gait not tested.  ASSESSMENT/PLAN Ms. Marissa Morris is a 60 y.o. female with no significant history admitted for N/V, right sided weakness and facial droop. No tPA given due to ICH.    ICH with IVH:  Left thalamic ICH with extensive IVH -  secondary to hypertensive emergency CT head left thalamic ICH with IVH Repeat CT head stable ICH and ventricles MRI  Stable ICH and ventricles MRA  Unremarkable.  Carotid Doppler  <50% BL 2D Echo  Normal EF 50-55%, left ventricular  global longitudinal strain is  apical sparring which may be suggestive of amyloid in the right clinic  context.   LDL 121 HgbA1c 5.7 SCDs for VTE prophylaxis, can start chemical prophylaxis when drain is off No antithrombotic prior to admission, now on No antithrombotic due to ICH Ongoing aggressive stroke risk factor management Therapy recommendations:  CIR Disposition:  Pending   Hydrocephalus  Slightly enlarged ventricles  Repeat CT and MRI showed stable ventricle size S/p EVD yesterday, draining well 10cc/h Repeat CT 6/3 mildly decreased ventricle size On keppra  bid for seizure prophylaxis  Close monitoring  Hypertensive emergency  HCTZ 13 mg daily Lisinoril 20 mg daily  Carvedilol 12.5 bid was discontinued on 6/9 and she was started on Labetalol 100 TID for better blood pressure control  On labetalol PRN Maintain SBP <160 for now, continue to titrate Clevidipine  Long term BP goal normotensive  Hyperlipidemia Home meds:  none  LDL 121, goal < 70 Consider statin at discharge  Dysphagia  Did not pass swallow NPO now Cortrak   Other Active Problems #Fever  #Leukocytosis  Fever noted overnight on 6/7. She continues to fever sporadically. No clear sign of infection, ventriculostomy site looks good, no cough, no cough.  - ICU following for fever work up; vaginal swab/culture sent to lab on 6/7 resulted as negative. She received Ceftriaxone x1, was briefly placed on doxycycline and remains on metronidazole empirically( Metronidazole started 6/7)    Hospital day # 7   Stark Jock, NP [facility] Nurse Practitioner  Patient seen and discussed with attending physician Dr. Pearlean Brownie   Patient is doing well and blood pressure is adequately controlled.  Continue ventriculostomy and is draining well and Pop off has been raised to 25cm since yesterday and follow-up  CT scan from today is unchanged with stable ventricular size.Marland Kitchen  Hopefully ventriculostomy will be  discontinued the next few days.  Continue strict blood pressure control with systolic goal below 160.  Continue oral blood pressure medications to core track tube..  Continue ongoing therapy consults. Discussed with Dr. Merrily Pew critical care medicine.  And Dr. Maisie Fus neurosurgery discussed with her boyfriend at the bedside This patient is critically ill and at significant risk of neurological worsening, death and care requires constant monitoring of vital signs, hemodynamics,respiratory and cardiac monitoring, extensive review of multiple databases, frequent neurological assessment, discussion with family, other specialists and medical decision making of high complexity.I have made any additions or clarifications directly to the above note.This critical care time does not reflect procedure time, or teaching time or supervisory time of PA/NP/Med Resident etc but could involve care discussion time.  I spent 32 minutes of neurocritical care time  in the care of  this patient.     Delia Heady, MD Medical Director [facility] Pager: [phone] 05/21/2021 2:24 PM  To contact Stroke Continuity provider, please refer to [email or web address]. After hours, contact General Neurology

## 2021-05-22 LAB — GLUCOSE, CAPILLARY
Glucose-Capillary: 112 mg/dL — ABNORMAL HIGH (ref 70–99)
Glucose-Capillary: 148 mg/dL — ABNORMAL HIGH (ref 70–99)
Glucose-Capillary: 160 mg/dL — ABNORMAL HIGH (ref 70–99)
Glucose-Capillary: 170 mg/dL — ABNORMAL HIGH (ref 70–99)
Glucose-Capillary: 172 mg/dL — ABNORMAL HIGH (ref 70–99)
Glucose-Capillary: 195 mg/dL — ABNORMAL HIGH (ref 70–99)

## 2021-05-22 MED ORDER — LABETALOL HCL 200 MG PO TABS
200.0000 mg | ORAL_TABLET | Freq: Three times a day (TID) | ORAL | Status: DC
  Administered 2021-05-22 – 2021-05-27 (×15): 200 mg
  Filled 2021-05-22 (×4): qty 1
  Filled 2021-05-22: qty 2
  Filled 2021-05-22 (×3): qty 1
  Filled 2021-05-22 (×2): qty 2
  Filled 2021-05-22 (×3): qty 1
  Filled 2021-05-22: qty 2
  Filled 2021-05-22: qty 1

## 2021-05-22 NOTE — Progress Notes (Signed)
Inpatient Rehab Admissions Coordinator:   Pt. Is not yet medically ready for CIR. Will continue to follow for potential admit pending medical readiness, insurance auth, and bed availability.  Megan Salon, MS, CCC-SLP Rehab Admissions Coordinator  [phone] (celll) [phone] (office)

## 2021-05-22 NOTE — Progress Notes (Signed)
  Speech Language Pathology Treatment: Cognitive-Linquistic  Patient Details Name: Marissa Morris MRN: [id number] DOB: [birth year] Today's Date: 05/22/2021 Time: 7425-9563 SLP Time Calculation (min) (ACUTE ONLY): 8 min  Assessment / Plan / Recommendation Clinical Impression  Marissa Morris opened her eyes twice out of multiple requests paired with painful stimuli. Face washed removing dried mucous from corners then promptly closed eyes and not responding to therapist. Given a wash cloth she washed her face and brushed her teeth on request then proceeded to rub toothette on face under eyes. Therapist could not elicit vocalizations during session. Behavioral aspect suspected today re: interaction. ST will plan to co-treat with OT/PT to maximize her alertness for communication/cognition and to assess swallow with instrumentation when appropriate.     HPI HPI: Marissa Morris is a 60 y.o. female presenting to [facility] in transfer from [facility] for management of acute intracranial hemorrhage. 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 Moderate chronic microvascular ischemic changes. No PMH on file.      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

## 2021-05-22 NOTE — Progress Notes (Addendum)
STROKE TEAM PROGRESS NOTE   SUBJECTIVE (INTERVAL HISTORY) No family at bedside, patient resting in bed. She is drowsy but rouses to cooperate with the physical examination.  And moves left side purposefully but remains plegic on the right.  EVD has been removed.   Still working on blood pressure control, pressures continue to remain elevated into the 170s, Labetalol 100 TID increased to 200 mg TID today   OBJECTIVE Temp:  [97.7 F (36.5 C)-100.3 F (37.9 C)] 97.7 F (36.5 C) (06/10 0800) Pulse Rate:  [68-98] 84 (06/10 0600) Cardiac Rhythm: Normal sinus rhythm (06/09 2000) Resp:  [13-25] 17 (06/10 0600) BP: (103-178)/(61-101) 132/70 (06/10 0600) SpO2:  [92 %-97 %] 94 % (06/10 0600) Weight:  [79.1 kg] 79.1 kg (06/10 0400)  Recent Labs  Lab 05/21/21 1642 05/21/21 1940 05/21/21 2326 05/22/21 0346 05/22/21 0854  GLUCAP 165* 190* 170* 172* 170*   Recent Labs  Lab 05/15/21 1340 05/15/21 1735 05/16/21 0605 05/16/21 1659 05/17/21 0318 05/18/21 0444 05/19/21 0347 05/20/21 0532 05/21/21 0259  NA 136  --  136  --  139 138 137 136 135  K 3.7  --  3.8  --  3.7 3.7 4.0 3.8 3.8  CL 104  --  101  --  101 100 99 98 97*  CO2 22  --  24  --  GLUCOSE 93  --  163*  --  119* 105* 148* 138* 158*  BUN 10  --  12  --  21* 25* 26* 24* 32*  CREATININE 0.64  --  0.74  --  0.92 0.82 0.81 0.70 0.78  CALCIUM 8.2*  --  8.3*  --  8.6* 8.4* 8.6* 8.4* 8.3*  MG 2.1 2.3 2.5* 2.4  --   --   --   --  2.6*  PHOS 3.8 3.4 3.6 4.3  --   --   --   --  4.2   No results for input(s): AST, ALT, ALKPHOS, BILITOT, PROT, ALBUMIN in the last 168 hours.  Recent Labs  Lab 05/17/21 0318 05/18/21 0444 05/19/21 0347 05/20/21 0532 05/21/21 0259  WBC 12.9* 10.9* 11.0* 12.8* 12.0*  HGB 15.0 14.1 14.1 14.8 13.8  HCT 44.8 43.7 43.6 44.5 42.3  MCV 95.5 98.9 97.5 97.8 97.0  PLT 201 184 201 201 202   No results for input(s): CKTOTAL, CKMB, CKMBINDEX, TROPONINI in the last 168 hours. No results for  input(s): LABPROT, INR in the last 72 hours. No results for input(s): COLORURINE, LABSPEC, PHURINE, GLUCOSEU, HGBUR, BILIRUBINUR, KETONESUR, PROTEINUR, UROBILINOGEN, NITRITE, LEUKOCYTESUR in the last 72 hours.  Invalid input(s): APPERANCEUR     Component Value Date/Time   CHOL 203 (H) 05/14/2021 0022   TRIG 127 05/20/2021 0532   HDL 68 05/14/2021 0022   CHOLHDL 3.0 05/14/2021 0022   VLDL 14 05/14/2021 0022   LDLCALC 121 (H) 05/14/2021 0022   Lab Results  Component Value Date   HGBA1C 5.7 (H) 05/14/2021      Component Value Date/Time   LABOPIA NONE DETECTED 05/14/2021 0254   COCAINSCRNUR NONE DETECTED 05/14/2021 0254   LABBENZ NONE DETECTED 05/14/2021 0254   AMPHETMU NONE DETECTED 05/14/2021 0254   THCU NONE DETECTED 05/14/2021 0254   LABBARB NONE DETECTED 05/14/2021 0254    No results for input(s): ETH in the last 168 hours.  I have personally reviewed the radiological images below and agree with the radiology interpretations.  DG Abd 1 View  Result Date: 05/14/2021 CLINICAL DATA:  60 year old Marissa Morris with intracranial hemorrhage. EXAM: ABDOMEN - 1 VIEW COMPARISON:  Portable pelvis radiograph today reported separately. FINDINGS: Portable AP supine view at 0344 hours. Non obstructed bowel gas pattern. No definite pneumoperitoneum on this supine view. Minimally included left lung base appears grossly negative. Scoliosis and degenerative changes in the spine. Pelvic dystrophic and/or vascular calcifications, mild. No acute osseous abnormality identified. IMPRESSION: 1. Normal bowel gas pattern. 2. Spinal scoliosis and degeneration. Electronically Signed   By: Odessa Fleming M.D.   On: 05/14/2021 06:19   CT HEAD WO CONTRAST  Result Date: 05/21/2021 CLINICAL DATA:  Stroke follow-up EXAM: CT HEAD WITHOUT CONTRAST TECHNIQUE: Contiguous axial images were obtained from the base of the skull through the vertex without intravenous contrast. COMPARISON:  Three days ago FINDINGS: Brain: Left thalamic  hematoma is unchanged in size and shape, measuring up to 4.2 x 3.1 cm on axial slices. Intraventricular clot has decreased in thickness at the level of the lateral ventricles. No increased ventricular size. Chronic small vessel ischemia in the hemispheric white matter. Negative for cortical infarct. 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 Stable positioning of EVD which traverses the frontal horn of the right lateral ventricle. Vascular: Negative Skull: Negative Sinuses/Orbits: Unremarkable IMPRESSION: 1. No recurrent hemorrhage.  Decreasing intraventricular clot. 2. Stable EVD positioning with unremarkable ventricular size. Electronically Signed   By: Marnee Spring M.D.   On: 05/21/2021 10:57   CT HEAD WO CONTRAST  Result Date: 05/18/2021 CLINICAL DATA:  60 year old Marissa Morris with left thalamic hemorrhage, IVH, EVD. EXAM: CT HEAD WITHOUT CONTRAST TECHNIQUE: Contiguous axial images were obtained from the base of the skull through the vertex without intravenous contrast. COMPARISON:  Head CT 05/15/2021 and earlier. FINDINGS: Brain: Right superior frontal approach EVD with catheter terminating at the 3rd ventricle and communicating with the right lateral ventricle on coronal image 35. The 3rd and 4th ventricles remain relatively decompressed. There is less 3rd and 4th ventricular IVH. Lateral ventricle size also mildly decreased since 05/15/2021, although with moderate residual lateral ventricular blood greater on the left. Oval left thalamic intra-axial hemorrhage encompasses 34 x 22 by 33 mm now (AP by transverse by CC) for an estimated blood volume of 12 mL, and has not changed since 05/15/2021. Regional edema and mild regional mass effect are stable. Stable gray-white matter differentiation elsewhere with Patchy and confluent white matter hypodensity. No new cortically based infarct identified. Basilar cisterns are patent. No significant midline shift. Vascular: Calcified atherosclerosis at the  skull base. Skull: Stable right superior burr hole. Sinuses/Orbits: Visualized paranasal sinuses and mastoids are stable and well aerated. Other: Right nasoenteric tube in place. Mild postoperative changes to the scalp vertex. Disconjugate gaze but otherwise negative orbits soft tissues. IMPRESSION: 1. Stable left thalamic intra-axial hemorrhage size since 05/15/2021. Stable regional edema and mild regional mass effect. 2. Stable right frontal approach EVD with slightly decreased lateral ventricle size since 05/15/2021. Moderate lateral ventricle IVH persists. There is less blood in the small 3rd and 4th ventricles now. 3. Underlying chronic small vessel disease. No new intracranial abnormality. Electronically Signed   By: Odessa Fleming M.D.   On: 05/18/2021 06:31   CT HEAD WO CONTRAST  Result Date: 05/15/2021 CLINICAL DATA:  Cerebral hemorrhage suspected. EXAM: CT HEAD WITHOUT CONTRAST TECHNIQUE: Contiguous axial images were obtained from the base of the skull through the vertex without intravenous contrast. COMPARISON:  MRI and CT of the brain May 14, 2021. FINDINGS: Brain: No significant interval change size of the left  thalamic hemorrhage with hematoma now measuring approximately 3.5 x 3.3 x 2 cm compared to 3.3 x 3.3 x 2.2 cm on prior. Persistent hypodensity surrounding the hematoma related to vasogenic edema. Supratentorial and infratentorial intraventricular hemorrhage related to hematoma decompression into the right lateral ventricle, status post right frontal approach ventricular drain placement. Drain tip is at level of the right foramen of Monro. There is mild interval decrease in size of the supratentorial ventricles, particularly the temporal horns. Patchy hypodensity of the white matter of the cerebral hemispheres, nonspecific most likely related to small vessel ischemia. Remote lacunar infarct in the right radiata. Vascular: No hyperdense vessel or unexpected calcification. Skull: Negative for fracture  or focal lesion. Sinuses/Orbits: No acute finding. Other: None. IMPRESSION: 1. Interval placement of a right frontal approach ventricular drain with the tip at the level of the right foramen of Monro. Interval mild decrease in size of the ventricular system, particularly the temporal horns of the lateral ventricles. 2. Stable appearance of left thalamic intraparenchymal hemorrhage with intraventricular extension. Electronically Signed   By: Baldemar Lenis M.D.   On: 05/15/2021 09:41   CT HEAD WO CONTRAST  Result Date: 05/14/2021 CLINICAL DATA:  Intraparenchymal hemorrhage.  Follow-up. EXAM: CT HEAD WITHOUT CONTRAST TECHNIQUE: Contiguous axial images were obtained from the base of the skull through the vertex without intravenous contrast. COMPARISON:  Same day CT head. FINDINGS: Mildly motion limited exam.  Within this limitation: Brain: When accounting for differences in technique, similar size of the acute intraparenchymal hemorrhage centered in the left thalamus which measures up to 3.3 x 2.2 x 3.3 cm (similar when remeasured on the prior). Similar regional mass effect. Similar intraventricular hemorrhage within the left greater than right lateral ventricles, third ventricle, and fourth ventricle. Similar ventriculomegaly with mild periventricular edema and prominence of the temporal horns. Additional scattered white matter hypoattenuation is nonspecific but most likely related to chronic microvascular ischemic disease. No evidence of acute large vascular territory infarct. Similar suspected prior infarct in the right corona radiata. Vascular: No hyperdense vessel identified. Calcific atherosclerosis. Skull: No acute fracture. Sinuses/Orbits: No acute findings. Other: No mastoid effusions. IMPRESSION: When accounting for differences in technique, similar size of the acute left thalamic intraparenchymal hemorrhage. Similar intraventricular extension of hemorrhage and ventriculomegaly.  Electronically Signed   By: Feliberto Harts MD   On: 05/14/2021 06:21   CT HEAD WO CONTRAST  Result Date: 05/14/2021 CLINICAL DATA:  Intraparenchymal hemorrhage. Follow-up of scans performed at [place] [place] [facility]. EXAM: CT HEAD WITHOUT CONTRAST TECHNIQUE: Contiguous axial images were obtained from the base of the skull through the vertex without intravenous contrast. COMPARISON:  None. I am not able to view the outside institution scans. FINDINGS: Brain: Intraparenchymal hematoma centered in the left thalamus measures 3.2 x 2.2 x 3.0 cm (volume = 11 cm^3). There is moderate hydrocephalus with periventricular hypoattenuation suggesting transependymal interstitial edema. There is rightward bulging of the septum pellucidum. Large amount of blood in the ventricles. Vascular: No abnormal hyperdensity of the major intracranial arteries or dural venous sinuses. No intracranial atherosclerosis. Skull: The visualized skull base, calvarium and extracranial soft tissues are normal. Sinuses/Orbits: No fluid levels or advanced mucosal thickening of the visualized paranasal sinuses. No mastoid or middle ear effusion. The orbits are normal. IMPRESSION: 1. Intraparenchymal hematoma centered in the left thalamus with intraventricular extension and moderate hydrocephalus with transependymal interstitial edema. Critical Value/emergent results were called by telephone at the time of interpretation on 05/14/2021 at 12:58 am to provider ERIC  [facility] , who verbally acknowledged these results. Electronically Signed   By: Deatra Robinson M.D.   On: 05/14/2021 00:58   MR MRA HEAD WO CONTRAST  Result Date: 05/14/2021 CLINICAL DATA:  Intracranial hemorrhage EXAM: MRI HEAD WITHOUT CONTRAST MRA HEAD WITHOUT CONTRAST TECHNIQUE: Multiplanar, multi-echo pulse sequences of the brain and surrounding structures were acquired without intravenous contrast. Angiographic images of the Circle of Willis were acquired using MRA technique without  intravenous contrast. COMPARISON: No pertinent prior exam. COMPARISON:  Correlation made with prior head CT FINDINGS: MRI HEAD Brain: Left thalamic hemorrhage is again identified similar to the recent CT with acute blood products. There is mild surrounding edema. Significant intraventricular extension is present as seen on CT with resulting hydrocephalus. Focus susceptibility in the right corona radiata reflects chronic microhemorrhage associated with a lacunar infarct. Additional patchy and confluent areas of T2 hyperintensity in the supratentorial white matter are nonspecific but probably reflect moderate chronic microvascular ischemic changes. No post contrast imaging performed therefore potential underlying lesion is not well evaluated. Vascular: Major vessel flow voids at the skull base are preserved. Skull and upper cervical spine: Normal marrow signal is preserved. Sinuses/Orbits: Paranasal sinuses are aerated. Orbits are unremarkable. Other: Sella is unremarkable.  Mastoid air cells are clear. MRA HEAD Intracranial internal carotid arteries are patent. Middle and anterior cerebral arteries are patent. Intracranial vertebral arteries, basilar artery, posterior cerebral arteries are patent. There is no significant stenosis or aneurysm. No abnormal flow related enhancement in the region of thalamic hemorrhage. IMPRESSION: No substantial change in left thalamic hemorrhage with significant intraventricular extension and hydrocephalus as well as mild edema and mass effect compared to recent CT. 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 Moderate chronic microvascular ischemic changes. Unremarkable MRA. Electronically Signed   By: Guadlupe Spanish M.D.   On: 05/14/2021 11:18   MR BRAIN WO CONTRAST  Result Date: 05/14/2021 CLINICAL DATA:  Intracranial hemorrhage EXAM: MRI HEAD WITHOUT CONTRAST MRA HEAD WITHOUT CONTRAST TECHNIQUE: Multiplanar, multi-echo pulse sequences of  the brain and surrounding structures were acquired without intravenous contrast. Angiographic images of the Circle of Willis were acquired using MRA technique without intravenous contrast. COMPARISON: No pertinent prior exam. COMPARISON:  Correlation made with prior head CT FINDINGS: MRI HEAD Brain: Left thalamic hemorrhage is again identified similar to the recent CT with acute blood products. There is mild surrounding edema. Significant intraventricular extension is present as seen on CT with resulting hydrocephalus. Focus susceptibility in the right corona radiata reflects chronic microhemorrhage associated with a lacunar infarct. Additional patchy and confluent areas of T2 hyperintensity in the supratentorial white matter are nonspecific but probably reflect moderate chronic microvascular ischemic changes. No post contrast imaging performed therefore potential underlying lesion is not well evaluated. Vascular: Major vessel flow voids at the skull base are preserved. Skull and upper cervical spine: Normal marrow signal is preserved. Sinuses/Orbits: Paranasal sinuses are aerated. Orbits are unremarkable. Other: Sella is unremarkable.  Mastoid air cells are clear. MRA HEAD Intracranial internal carotid arteries are patent. Middle and anterior cerebral arteries are patent. Intracranial vertebral arteries, basilar artery, posterior cerebral arteries are patent. There is no significant stenosis or aneurysm. No abnormal flow related enhancement in the region of thalamic hemorrhage. IMPRESSION: No substantial change in left thalamic hemorrhage with significant intraventricular extension and hydrocephalus as well as mild edema and mass effect compared to recent CT. 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 Moderate chronic microvascular  ischemic changes. Unremarkable MRA. Electronically Signed   By: Guadlupe Spanish M.D.   On: 05/14/2021 11:18   DG Pelvis Portable  Result  Date: 05/14/2021 CLINICAL DATA:  60 year old Marissa Morris with intracranial hemorrhage. EXAM: PORTABLE PELVIS 1-2 VIEWS COMPARISON:  None. FINDINGS: Portable AP view at 0346 hours. Mild likely fibroid associated dystrophic calcifications in the pelvis eccentric to the right. Femoral heads normally located. Pelvis and proximal femurs appear intact. No acute osseous abnormality identified. Negative visible bowel gas pattern. IMPRESSION: Negative. Electronically Signed   By: Odessa Fleming M.D.   On: 05/14/2021 06:18   DG CHEST PORT 1 VIEW  Result Date: 05/17/2021 CLINICAL DATA:  Respiratory failure EXAM: PORTABLE CHEST 1 VIEW COMPARISON:  May 14, 2021 FINDINGS: Feeding tube tip is below the diaphragm. No pneumothorax. There is no edema or airspace opacity. There is cardiomegaly with pulmonary vascularity normal. No adenopathy. No bone lesions. IMPRESSION: No edema or airspace opacity. Cardiomegaly, stable. Feeding tube tip below diaphragm. Electronically Signed   By: Bretta Bang III M.D.   On: 05/17/2021 14:17   DG CHEST PORT 1 VIEW  Result Date: 05/14/2021 CLINICAL DATA:  Provided history: Follow-up. EXAM: PORTABLE CHEST 1 VIEW COMPARISON:  None. FINDINGS: Cardiomediastinal silhouette is enlarged, likely accentuated by AP technique. Calcific atherosclerosis of the aorta. Low lung volumes. No consolidation. EKG leads bilaterally. No visible pleural effusions or pneumothorax on this single semi erect AP radiograph. IMPRESSION: 1. Low lung volumes without evidence of acute cardiopulmonary disease. 2. Cardiomegaly. Electronically Signed   By: Feliberto Harts MD   On: 05/14/2021 06:13   DG Abd Portable 1V  Result Date: 05/15/2021 CLINICAL DATA:  Feeding tube placement. EXAM: PORTABLE ABDOMEN - 1 VIEW COMPARISON:  None. FINDINGS: Feeding tube is in place with the tip in the distal stomach directed toward the duodenum. IMPRESSION: As above. Electronically Signed   By: Drusilla Kanner M.D.   On: 05/15/2021 11:40    ECHOCARDIOGRAM COMPLETE  Result Date: 05/16/2021    ECHOCARDIOGRAM REPORT   Patient Name:   KENNEDI LIZARDO Date of Exam: 05/14/2021 Medical Rec #:  [id number]    Height: Accession #:    [id number]   Weight:       166.0 lb Date of Birth:  03/15/1961    BSA:          1.783 m Patient Age:    59 years     BP:           122/69 mmHg Patient Gender: F            HR:           77 bpm. Exam Location:  Inpatient Procedure: 2D Echo, Cardiac Doppler and Color Doppler Indications:    Stroke  History:        Patient has no prior history of Echocardiogram examinations.  Sonographer:    Ross Ludwig RDCS (AE) Referring Phys: [phone] ERIC LINDZEN  Sonographer Comments: Patient not responsive to request to sniff for IVC collapse test. IMPRESSIONS  1. Left ventricular ejection fraction, by estimation, is 50 to 55%. The left ventricle has low normal function. The left ventricle has no regional wall motion abnormalities. There is moderate thickening of the septum. The rest of the LV segments demonstrate mild left ventricular hypertrophy. Left ventricular diastolic parameters are consistent with Grade II diastolic dysfunction (pseudonormalization). Elevated left ventricular end-diastolic pressure.  2. The average left ventricular global longitudinal strain is -6.7 %. The global longitudinal strain is abnormal. Strain pattern shows  relative apical sparring which may be suggestive of amyloid in the right clinic context.  3. Right ventricular systolic function is normal. The right ventricular size is normal.  4. The mitral valve is normal in structure. Trivial mitral valve regurgitation. No evidence of mitral stenosis.  5. The aortic valve is tricuspid. There is mild calcification of the aortic valve. There is mild thickening of the aortic valve. Aortic valve regurgitation is not visualized. Mild aortic valve sclerosis is present, with no evidence of aortic valve stenosis.  6. The inferior vena cava is dilated in size with <50% respiratory  variability, suggesting right atrial pressure of 15 mmHg.  7. Left atrial size was mildly dilated. Comparison(s): No prior Echocardiogram. Conclusion(s)/Recommendation(s): No intracardiac source of embolism detected on this transthoracic study. A transesophageal echocardiogram is recommended to exclude cardiac source of embolism if clinically indicated. FINDINGS  Left Ventricle: Left ventricular ejection fraction, by estimation, is 50 to 55%. The left ventricle has low normal function. The left ventricle has no regional wall motion abnormalities. The average left ventricular global longitudinal strain is -6.7 %.  The global longitudinal strain is abnormal. The left ventricular internal cavity size was normal in size. There is moderate thickening of the septum. The rest of the LV segments demonstrate mild left ventricular hypertrophy. Left ventricular diastolic parameters are consistent with Grade II diastolic dysfunction (pseudonormalization). Elevated left ventricular end-diastolic pressure. Right Ventricle: The right ventricular size is normal. No increase in right ventricular wall thickness. Right ventricular systolic function is normal. Left Atrium: Left atrial size was mildly dilated. Right Atrium: Right atrial size was normal in size. Pericardium: There is no evidence of pericardial effusion. Mitral Valve: The mitral valve is normal in structure. There is mild thickening of the mitral valve leaflet(s). There is mild calcification of the mitral valve leaflet(s). Mild mitral annular calcification. Trivial mitral valve regurgitation. No evidence  of mitral valve stenosis. MV peak gradient, 7.1 mmHg. The mean mitral valve gradient is 4.0 mmHg. Tricuspid Valve: The tricuspid valve is normal in structure. Tricuspid valve regurgitation is trivial. No evidence of tricuspid stenosis. Aortic Valve: The aortic valve is tricuspid. There is mild calcification of the aortic valve. There is mild thickening of the aortic  valve. Aortic valve regurgitation is not visualized. Mild aortic valve sclerosis is present, with no evidence of aortic valve stenosis. Aortic valve mean gradient measures 8.0 mmHg. Aortic valve peak gradient measures 16.3 mmHg. Aortic valve area, by VTI measures 2.05 cm. Pulmonic Valve: The pulmonic valve was normal in structure. Pulmonic valve regurgitation is trivial. No evidence of pulmonic stenosis. Aorta: The aortic root and ascending aorta are structurally normal, with no evidence of dilitation. Venous: The inferior vena cava is dilated in size with less than 50% respiratory variability, suggesting right atrial pressure of 15 mmHg. IAS/Shunts: No atrial level shunt detected by color flow Doppler.  LEFT VENTRICLE PLAX 2D LVIDd:         4.70 cm  Diastology LVIDs:         4.20 cm  LV e' medial:    5.55 cm/s LV PW:         1.60 cm  LV E/e' medial:  23.2 LV IVS:        1.80 cm  LV e' lateral:   6.31 cm/s LVOT diam:     1.80 cm  LV E/e' lateral: 20.4 LV SV:         69 LV SV Index:   39  2D Longitudinal Strain LVOT Area:     2.54 cm 2D Strain GLS Avg:     -6.7 %  RIGHT VENTRICLE             IVC RV Basal diam:  3.50 cm     IVC diam: 2.30 cm RV S prime:     13.20 cm/s TAPSE (M-mode): 3.2 cm LEFT ATRIUM             Index       RIGHT ATRIUM           Index LA diam:        4.10 cm 2.30 cm/m  RA Area:     16.30 cm LA Vol (A2C):   42.1 ml 23.61 ml/m RA Volume:   45.00 ml  25.23 ml/m LA Vol (A4C):   43.6 ml 24.45 ml/m LA Biplane Vol: 47.3 ml 26.52 ml/m  AORTIC VALVE AV Area (Vmax):    1.86 cm AV Area (Vmean):   2.09 cm AV Area (VTI):     2.05 cm AV Vmax:           202.00 cm/s AV Vmean:          135.000 cm/s AV VTI:            0.336 m AV Peak Grad:      16.3 mmHg AV Mean Grad:      8.0 mmHg LVOT Vmax:         148.00 cm/s LVOT Vmean:        111.000 cm/s LVOT VTI:          0.271 m LVOT/AV VTI ratio: 0.81  AORTA Ao Root diam: 2.90 cm Ao Asc diam:  3.20 cm MITRAL VALVE MV Area (PHT): 3.08 cm     SHUNTS MV Area  VTI:   1.83 cm     Systemic VTI:  0.27 m MV Peak grad:  7.1 mmHg     Systemic Diam: 1.80 cm MV Mean grad:  4.0 mmHg MV Vmax:       1.33 m/s MV Vmean:      90.6 cm/s MV Decel Time: 246 msec MV E velocity: 129.00 cm/s MV A velocity: 118.00 cm/s MV E/A ratio:  1.09 Laurance Flatten MD Electronically signed by Laurance Flatten MD Signature Date/Time: 05/16/2021/7:51:23 AM    Final    VAS US CAROTID  Result Date: 05/15/2021 Carotid Arterial Duplex Study Patient Name:  TIFFANIE BLASSINGAME  Date of Exam:   05/14/2021 Medical Rec #: [id number]     Accession #:    [id number] Date of Birth: Oct 16, 1961     Patient Gender: F Patient Age:   11Y Exam Location:  [facility] [facility] Procedure:      VAS US CAROTID Referring Phys: [id number] Marvel Plan --------------------------------------------------------------------------------  Indications:       Hypertensive emergency (122/128). 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 Risk Factors:      Hypertension. Comparison Study:  No prior study on file Performing Technologist: Sherren Kerns RVS  Examination Guidelines: A complete evaluation includes B-mode imaging, spectral Doppler, color Doppler, and power Doppler as needed of all accessible portions of each vessel. Bilateral testing is considered an integral part of a complete examination. Limited examinations for reoccurring indications may be performed as noted.  Right Carotid Findings: +----------+--------+--------+--------+------------------+--------+           PSV cm/sEDV  cm/sStenosisPlaque DescriptionComments +----------+--------+--------+--------+------------------+--------+ CCA Prox  96      9                                          +----------+--------+--------+--------+------------------+--------+ CCA Distal104     13                                         +----------+--------+--------+--------+------------------+--------+ ICA Prox  66      15      1-39%    heterogenous               +----------+--------+--------+--------+------------------+--------+ ICA Distal54      13                                         +----------+--------+--------+--------+------------------+--------+ ECA       151     22                                         +----------+--------+--------+--------+------------------+--------+ +----------+--------+-------+--------+-------------------+           PSV cm/sEDV cmsDescribeArm Pressure (mmHG) +----------+--------+-------+--------+-------------------+ Subclavian150                                        +----------+--------+-------+--------+-------------------+ +---------+--------+--+--------+--+---------+ VertebralPSV cm/s41EDV cm/s12Antegrade +---------+--------+--+--------+--+---------+  Left Carotid Findings: +----------+--------+--------+--------+------------------+------------------+           PSV cm/sEDV cm/sStenosisPlaque DescriptionComments           +----------+--------+--------+--------+------------------+------------------+ CCA Prox  180     15                                                   +----------+--------+--------+--------+------------------+------------------+ CCA Distal98      13                                intimal thickening +----------+--------+--------+--------+------------------+------------------+ ICA Prox  70      22      1-39%                     intimal thickening +----------+--------+--------+--------+------------------+------------------+ ICA Distal68      22                                                   +----------+--------+--------+--------+------------------+------------------+ ECA       91      18                                                   +----------+--------+--------+--------+------------------+------------------+ +----------+--------+--------+--------+-------------------+  PSV cm/sEDV cm/sDescribeArm  Pressure (mmHG) +----------+--------+--------+--------+-------------------+ Subclavian87                                          +----------+--------+--------+--------+-------------------+ +---------+--------+--+--------+--+---------+ VertebralPSV cm/s61EDV cm/s13Antegrade +---------+--------+--+--------+--+---------+   Summary: Right Carotid: Velocities in the right ICA are consistent with a 1-39% stenosis. Left Carotid: Velocities in the left ICA are consistent with a 1-39% stenosis. Vertebrals: Bilateral vertebral arteries demonstrate antegrade flow. *See table(s) above for measurements and observations.  Electronically signed by Marvel PlanJindong Xu MD on 05/15/2021 at 2:54:40 PM.    Final       PHYSICAL EXAM Middle-aged African-American lady not in distress Temp:  [97.7 F (36.5 C)-100.3 F (37.9 C)] 97.7 F (36.5 C) (06/10 0800) Pulse Rate:  [68-98] 84 (06/10 0600) Resp:  [13-25] 17 (06/10 0600) BP: (103-178)/(61-101) 132/70 (06/10 0600) SpO2:  [92 %-97 %] 94 % (06/10 0600) Weight:  [79.1 kg] 79.1 kg (06/10 0400) Cardiovascular - Regular rhythm and rate. Neurological Exam  -drowsy but can arouse partially with stimulation but continues to have expressive greater than receptive aphasia and speaks only occasional words.  Cannot speak sentences. Follows simple commands such as sticking out tongue and lifting left hand. Left gaze preference barely cross midline, not blinking to visual threat consistently, PERRL. Right facial droop. Tongue protrusion not cooperative. Spontaneously moving LUE against gravity, no drift, flaccid on the RUE however with pain, bicep 3-/5. Withdraw to pain LLE 3/5, but only mild withdraw 2/5 at RLE. Sensation, coordination not cooperative and gait not tested.  ASSESSMENT/PLAN Marissa Morris is a 60 y.o. Marissa Morris with no significant history admitted for N/V, right sided weakness and facial droop. No tPA given due to ICH.    ICH with IVH:  Left thalamic ICH with  extensive IVH -  secondary to hypertensive emergency CT head left thalamic ICH with IVH Repeat CT head stable ICH and ventricles MRI  Stable ICH and ventricles MRA  Unremarkable.  Carotid Doppler  <50% BL 2D Echo  Normal EF 50-55%, left ventricular global longitudinal strain is  apical sparring which may be suggestive of amyloid in the right clinic  context.   LDL 121 HgbA1c 5.7 SCDs for VTE prophylaxis, can start chemical prophylaxis when drain is off No antithrombotic prior to admission, now on No antithrombotic due to ICH Ongoing aggressive stroke risk factor management Therapy recommendations:  CIR Disposition:  Pending   Hydrocephalus  Slightly enlarged ventricles  Repeat CT and MRI showed stable ventricle size S/p EVD yesterday, draining well 10cc/h Repeat CT 6/3 mildly decreased ventricle size On keppra 500mg  bid for seizure prophylaxis  Close monitoring  Hypertensive emergency  HCTZ 13 mg daily Lisinoril 20 mg daily  Carvedilol 12.5 bid was discontinued on 6/9 and she was started on Labetalol 100 TID for better blood pressure control. Increased to 200 TID on 05/22/21 as blood pressure continue to be elevated in the 140-170 range.  On labetalol PRN Maintain SBP <160 for now, continue to titrate Clevidipine  Long term BP goal normotensive  Hyperlipidemia Home meds:  none  LDL 121, goal < 70 Consider statin at discharge  Dysphagia  Did not pass swallow NPO now Cortrak   Other Active Problems #Fever  #Leukocytosis  Fever noted overnight on 6/7. She continues to fever sporadically. No clear infectious source has been identified at this time. White count is stable in the 10-12 range.  -  ICU assisted with fever work up; vaginal swab/culture was sent to lab on 6/7 resulted as negative. She received Ceftriaxone x1, was briefly placed on doxycycline and remains on metronidazole empirically( Metronidazole started 6/7)   Hospital day # 8   Stark Jock, NP [facility]  Neurohospitalist Nurse Practitioner  Patient seen and discussed with attending physician Dr. Pearlean Brownie   Patient is doing well and blood pressure is adequately controlled.  Ventriculostomy was discontinued earlier today.  We will continue close neurological monitoring if stable will consider transferring out of the ICU tomorrow continue strict blood pressure control with systolic goal below 160.  Continue oral blood pressure medications to core track tube..  Continue ongoing therapy consults. Discussed with Dr.  Dr. Maisie Fus neurosurgery   T.This patient is critically ill and at significant risk of neurological worsening, death and care requires constant monitoring of vital signs, hemodynamics,respiratory and cardiac monitoring, extensive review of multiple databases, frequent neurological assessment, discussion with family, other specialists and medical decision making of high complexity.I have made any additions or clarifications directly to the above note.This critical care time does not reflect procedure time, or teaching time or supervisory time of PA/NP/Med Resident etc but could involve care discussion time.  I spent 30 minutes of neurocritical care time  in the care of  this patient.         Delia Heady, MD Medical Director [facility] Pager: [phone] 05/22/2021 9:42 AM  To contact Stroke Continuity provider, please refer to [email or web address]. After hours, contact General Neurology

## 2021-05-22 NOTE — Progress Notes (Signed)
Subjective: NAEs o/n  Objective: Vital signs in last 24 hours: Temp:  [97.7 F (36.5 C)-99.5 F (37.5 C)] 99.3 F (37.4 C) (06/10 1600) Pulse Rate:  [77-99] 83 (06/10 1700) Resp:  [14-25] 14 (06/10 1700) BP: (125-188)/(68-101) 168/99 (06/10 1700) SpO2:  [93 %-97 %] 94 % (06/10 1700) Weight:  [79.1 kg] 79.1 kg (06/10 0400)  Intake/Output from previous day: 06/09 0701 - 06/10 0700 In: 1380 [NG/GT:1380] Out: 1399 [Urine:1300; Drains:99] Intake/Output this shift: Total I/O In: 690 [NG/GT:690] Out: 20 [Drains:20]  CSF drainage dark red tinged Eyes open to voice intermittently.  Purposeful on left but difficulty following simple commands.  Plegic on right.  Lab Results: Recent Labs    05/20/21 0532 05/21/21 0259  WBC 12.8* 12.0*  HGB 14.8 13.8  HCT 44.5 42.3  PLT 201 202   BMET Recent Labs    05/20/21 0532 05/21/21 0259  NA 136 135  K 3.8 3.8  CL 98 97*  CO2 27 25  GLUCOSE 138* 158*  BUN 24* 32*  CREATININE 0.70 0.78  CALCIUM 8.4* 8.3*    Studies/Results: CT HEAD WO CONTRAST  Result Date: 05/21/2021 CLINICAL DATA:  Stroke follow-up EXAM: CT HEAD WITHOUT CONTRAST TECHNIQUE: Contiguous axial images were obtained from the base of the skull through the vertex without intravenous contrast. COMPARISON:  Three days ago FINDINGS: Brain: Left thalamic hematoma is unchanged in size and shape, measuring up to 4.2 x 3.1 cm on axial slices. Intraventricular clot has decreased in thickness at the level of the lateral ventricles. No increased ventricular size. Chronic small vessel ischemia in the hemispheric white matter. Negative for cortical infarct. 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 Stable positioning of EVD which traverses the frontal horn of the right lateral ventricle. Vascular: Negative Skull: Negative Sinuses/Orbits: Unremarkable IMPRESSION: 1. No recurrent hemorrhage.  Decreasing intraventricular clot. 2. Stable EVD positioning with unremarkable  ventricular size. Electronically Signed   By: Marnee Spring M.D.   On: 05/21/2021 10:57    Assessment/Plan: 60 yo F with large L thalamic ICH - EVD removed this morning as no change in ventricle size or appreciable clinical change with EVD weaning - continue supportive care   Bedelia Person 05/22/2021, 5:39 PM

## 2021-05-23 LAB — TRIGLYCERIDES: Triglycerides: 91 mg/dL (ref <150)

## 2021-05-23 LAB — CBC
HCT: 40 % (ref 36.0–46.0)
Hemoglobin: 12.9 g/dL (ref 12.0–15.0)
MCH: 31.5 pg (ref 26.0–34.0)
MCHC: 32.3 g/dL (ref 30.0–36.0)
MCV: 97.6 fL (ref 80.0–100.0)
Platelets: 246 10*3/uL (ref 150–400)
RBC: 4.1 MIL/uL (ref 3.87–5.11)
RDW: 13.2 % (ref 11.5–15.5)
WBC: 14.3 10*3/uL — ABNORMAL HIGH (ref 4.0–10.5)
nRBC: 0 % (ref 0.0–0.2)

## 2021-05-23 LAB — BASIC METABOLIC PANEL
Anion gap: 9 (ref 5–15)
BUN: 28 mg/dL — ABNORMAL HIGH (ref 6–20)
CO2: 26 mmol/L (ref 22–32)
Calcium: 8.6 mg/dL — ABNORMAL LOW (ref 8.9–10.3)
Chloride: 100 mmol/L (ref 98–111)
Creatinine, Ser: 0.72 mg/dL (ref 0.44–1.00)
GFR, Estimated: >60 mL/min (ref >60)
Glucose, Bld: 184 mg/dL — ABNORMAL HIGH (ref 70–99)
Potassium: 4.3 mmol/L (ref 3.5–5.1)
Sodium: 135 mmol/L (ref 135–145)

## 2021-05-23 LAB — GLUCOSE, CAPILLARY
Glucose-Capillary: 158 mg/dL — ABNORMAL HIGH (ref 70–99)
Glucose-Capillary: 175 mg/dL — ABNORMAL HIGH (ref 70–99)
Glucose-Capillary: 176 mg/dL — ABNORMAL HIGH (ref 70–99)
Glucose-Capillary: 168 mg/dL — ABNORMAL HIGH (ref 70–99)
Glucose-Capillary: 144 mg/dL — ABNORMAL HIGH (ref 70–99)
Glucose-Capillary: 163 mg/dL — ABNORMAL HIGH (ref 70–99)

## 2021-05-23 MED ORDER — HYDRALAZINE HCL 10 MG PO TABS: 10.0000 mg | ORAL_TABLET | Freq: Three times a day (TID) | ORAL | Status: DC

## 2021-05-23 MED ORDER — HYDRALAZINE HCL 10 MG PO TABS
10.0000 mg | ORAL_TABLET | Freq: Three times a day (TID) | ORAL | Status: DC
  Administered 2021-05-23 – 2021-05-24 (×4): 10 mg
  Filled 2021-05-23 (×6): qty 1

## 2021-05-23 MED ORDER — HYDRALAZINE HCL 10 MG PO TABS
10.0000 mg | ORAL_TABLET | Freq: Three times a day (TID) | ORAL | Status: DC
  Filled 2021-05-23 (×2): qty 1

## 2021-05-23 NOTE — Progress Notes (Signed)
STROKE TEAM PROGRESS NOTE   SUBJECTIVE (INTERVAL HISTORY) No family at bedside, patient resting in bed. She is drowsy but rouses to voice and able to cooperate with the physical examination. She moves left side purposefully but remains plegic on the right.  EVD has been removed.   Still working on blood pressure control, pressures continue to remain elevated into the 170s, Labetalol 100 TID increased to 200 mg TID today   OBJECTIVE Temp:  [98.6 F (37 C)-99.3 F (37.4 C)] 98.6 F (37 C) (06/11 0400) Pulse Rate:  [73-99] 75 (06/11 0900) Cardiac Rhythm: Normal sinus rhythm (06/10 2000) Resp:  [14-24] 17 (06/11 0900) BP: (125-188)/(68-99) 167/96 (06/11 0900) SpO2:  [92 %-96 %] 94 % (06/11 0900)  Recent Labs  Lab 05/22/21 1557 05/22/21 1945 05/22/21 2351 05/23/21 0353 05/23/21 0832  GLUCAP 148* 195* 160* 158* 175*   Recent Labs  Lab 05/16/21 1659 05/17/21 0318 05/18/21 0444 05/19/21 0347 05/20/21 0532 05/21/21 0259 05/23/21 0717  NA  --    < > 138 137 136 135 135  K  --    < > 3.7 4.0 3.8 3.8 4.3  CL  --    < > 100 99 98 97* 100  CO2  --    < > 29 28 27 25 26   GLUCOSE  --    < > 105* 148* 138* 158* 184*  BUN  --    < > 25* 26* 24* 32* 28*  CREATININE  --    < > 0.82 0.81 0.70 0.78 0.72  CALCIUM  --    < > 8.4* 8.6* 8.4* 8.3* 8.6*  MG 2.4  --   --   --   --  2.6*  --   PHOS 4.3  --   --   --   --  4.2  --    < > = values in this interval not displayed.   No results for input(s): AST, ALT, ALKPHOS, BILITOT, PROT, ALBUMIN in the last 168 hours.  Recent Labs  Lab 05/18/21 0444 05/19/21 0347 05/20/21 0532 05/21/21 0259 05/23/21 0717  WBC 10.9* 11.0* 12.8* 12.0* 14.3*  HGB 14.1 14.1 14.8 13.8 12.9  HCT 43.7 43.6 44.5 42.3 40.0  MCV 98.9 97.5 97.8 97.0 97.6  PLT 184 201 201 202 246   No results for input(s): CKTOTAL, CKMB, CKMBINDEX, TROPONINI in the last 168 hours. No results for input(s): LABPROT, INR in the last 72 hours. No results for input(s): COLORURINE,  LABSPEC, PHURINE, GLUCOSEU, HGBUR, BILIRUBINUR, KETONESUR, PROTEINUR, UROBILINOGEN, NITRITE, LEUKOCYTESUR in the last 72 hours.  Invalid input(s): APPERANCEUR     Component Value Date/Time   CHOL 203 (H) 05/14/2021 0022   TRIG 91 05/23/2021 0717   HDL 68 05/14/2021 0022   CHOLHDL 3.0 05/14/2021 0022   VLDL 14 05/14/2021 0022   LDLCALC 121 (H) 05/14/2021 0022   Lab Results  Component Value Date   HGBA1C 5.7 (H) 05/14/2021      Component Value Date/Time   LABOPIA NONE DETECTED 05/14/2021 0254   COCAINSCRNUR NONE DETECTED 05/14/2021 0254   LABBENZ NONE DETECTED 05/14/2021 0254   AMPHETMU NONE DETECTED 05/14/2021 0254   THCU NONE DETECTED 05/14/2021 0254   LABBARB NONE DETECTED 05/14/2021 0254    No results for input(s): ETH in the last 168 hours.  I have personally reviewed the radiological images below and agree with the radiology interpretations.  DG Abd 1 View  Result Date: 05/14/2021 CLINICAL DATA:  60 year old female with intracranial hemorrhage.  EXAM: ABDOMEN - 1 VIEW COMPARISON:  Portable pelvis radiograph today reported separately. FINDINGS: Portable AP supine view at 0344 hours. Non obstructed bowel gas pattern. No definite pneumoperitoneum on this supine view. Minimally included left lung base appears grossly negative. Scoliosis and degenerative changes in the spine. Pelvic dystrophic and/or vascular calcifications, mild. No acute osseous abnormality identified. IMPRESSION: 1. Normal bowel gas pattern. 2. Spinal scoliosis and degeneration. Electronically Signed   By: Odessa Fleming M.D.   On: 05/14/2021 06:19   CT HEAD WO CONTRAST  Result Date: 05/21/2021 CLINICAL DATA:  Stroke follow-up EXAM: CT HEAD WITHOUT CONTRAST TECHNIQUE: Contiguous axial images were obtained from the base of the skull through the vertex without intravenous contrast. COMPARISON:  Three days ago FINDINGS: Brain: Left thalamic hematoma is unchanged in size and shape, measuring up to 4.2 x 3.1 cm on axial slices.  Intraventricular clot has decreased in thickness at the level of the lateral ventricles. No increased ventricular size. Chronic small vessel ischemia in the hemispheric white matter. Negative for cortical infarct. 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 Stable positioning of EVD which traverses the frontal horn of the right lateral ventricle. Vascular: Negative Skull: Negative Sinuses/Orbits: Unremarkable IMPRESSION: 1. No recurrent hemorrhage.  Decreasing intraventricular clot. 2. Stable EVD positioning with unremarkable ventricular size. Electronically Signed   By: Marnee Spring M.D.   On: 05/21/2021 10:57   CT HEAD WO CONTRAST  Result Date: 05/18/2021 CLINICAL DATA:  60 year old female with left thalamic hemorrhage, IVH, EVD. EXAM: CT HEAD WITHOUT CONTRAST TECHNIQUE: Contiguous axial images were obtained from the base of the skull through the vertex without intravenous contrast. COMPARISON:  Head CT 05/15/2021 and earlier. FINDINGS: Brain: Right superior frontal approach EVD with catheter terminating at the 3rd ventricle and communicating with the right lateral ventricle on coronal image 35. The 3rd and 4th ventricles remain relatively decompressed. There is less 3rd and 4th ventricular IVH. Lateral ventricle size also mildly decreased since 05/15/2021, although with moderate residual lateral ventricular blood greater on the left. Oval left thalamic intra-axial hemorrhage encompasses 34 x 22 by 33 mm now (AP by transverse by CC) for an estimated blood volume of 12 mL, and has not changed since 05/15/2021. Regional edema and mild regional mass effect are stable. Stable gray-white matter differentiation elsewhere with Patchy and confluent white matter hypodensity. No new cortically based infarct identified. Basilar cisterns are patent. No significant midline shift. Vascular: Calcified atherosclerosis at the skull base. Skull: Stable right superior burr hole. Sinuses/Orbits: Visualized paranasal  sinuses and mastoids are stable and well aerated. Other: Right nasoenteric tube in place. Mild postoperative changes to the scalp vertex. Disconjugate gaze but otherwise negative orbits soft tissues. IMPRESSION: 1. Stable left thalamic intra-axial hemorrhage size since 05/15/2021. Stable regional edema and mild regional mass effect. 2. Stable right frontal approach EVD with slightly decreased lateral ventricle size since 05/15/2021. Moderate lateral ventricle IVH persists. There is less blood in the small 3rd and 4th ventricles now. 3. Underlying chronic small vessel disease. No new intracranial abnormality. Electronically Signed   By: Odessa Fleming M.D.   On: 05/18/2021 06:31   CT HEAD WO CONTRAST  Result Date: 05/15/2021 CLINICAL DATA:  Cerebral hemorrhage suspected. EXAM: CT HEAD WITHOUT CONTRAST TECHNIQUE: Contiguous axial images were obtained from the base of the skull through the vertex without intravenous contrast. COMPARISON:  MRI and CT of the brain May 14, 2021. FINDINGS: Brain: No significant interval change size of the left thalamic hemorrhage with hematoma now  measuring approximately 3.5 x 3.3 x 2 cm compared to 3.3 x 3.3 x 2.2 cm on prior. Persistent hypodensity surrounding the hematoma related to vasogenic edema. Supratentorial and infratentorial intraventricular hemorrhage related to hematoma decompression into the right lateral ventricle, status post right frontal approach ventricular drain placement. Drain tip is at level of the right foramen of Monro. There is mild interval decrease in size of the supratentorial ventricles, particularly the temporal horns. Patchy hypodensity of the white matter of the cerebral hemispheres, nonspecific most likely related to small vessel ischemia. Remote lacunar infarct in the right radiata. Vascular: No hyperdense vessel or unexpected calcification. Skull: Negative for fracture or focal lesion. Sinuses/Orbits: No acute finding. Other: None. IMPRESSION: 1. Interval  placement of a right frontal approach ventricular drain with the tip at the level of the right foramen of Monro. Interval mild decrease in size of the ventricular system, particularly the temporal horns of the lateral ventricles. 2. Stable appearance of left thalamic intraparenchymal hemorrhage with intraventricular extension. Electronically Signed   By: Baldemar Lenis M.D.   On: 05/15/2021 09:41   CT HEAD WO CONTRAST  Result Date: 05/14/2021 CLINICAL DATA:  Intraparenchymal hemorrhage.  Follow-up. EXAM: CT HEAD WITHOUT CONTRAST TECHNIQUE: Contiguous axial images were obtained from the base of the skull through the vertex without intravenous contrast. COMPARISON:  Same day CT head. FINDINGS: Mildly motion limited exam.  Within this limitation: Brain: When accounting for differences in technique, similar size of the acute intraparenchymal hemorrhage centered in the left thalamus which measures up to 3.3 x 2.2 x 3.3 cm (similar when remeasured on the prior). Similar regional mass effect. Similar intraventricular hemorrhage within the left greater than right lateral ventricles, third ventricle, and fourth ventricle. Similar ventriculomegaly with mild periventricular edema and prominence of the temporal horns. Additional scattered white matter hypoattenuation is nonspecific but most likely related to chronic microvascular ischemic disease. No evidence of acute large vascular territory infarct. Similar suspected prior infarct in the right corona radiata. Vascular: No hyperdense vessel identified. Calcific atherosclerosis. Skull: No acute fracture. Sinuses/Orbits: No acute findings. Other: No mastoid effusions. IMPRESSION: When accounting for differences in technique, similar size of the acute left thalamic intraparenchymal hemorrhage. Similar intraventricular extension of hemorrhage and ventriculomegaly. Electronically Signed   By: Feliberto Harts MD   On: 05/14/2021 06:21   CT HEAD WO  CONTRAST  Result Date: 05/14/2021 CLINICAL DATA:  Intraparenchymal hemorrhage. Follow-up of scans performed at [place], [place] [facility]. EXAM: CT HEAD WITHOUT CONTRAST TECHNIQUE: Contiguous axial images were obtained from the base of the skull through the vertex without intravenous contrast. COMPARISON:  None. I am not able to view the outside institution scans. FINDINGS: Brain: Intraparenchymal hematoma centered in the left thalamus measures 3.2 x 2.2 x 3.0 cm (volume = 11 cm^3). There is moderate hydrocephalus with periventricular hypoattenuation suggesting transependymal interstitial edema. There is rightward bulging of the septum pellucidum. Large amount of blood in the ventricles. Vascular: No abnormal hyperdensity of the major intracranial arteries or dural venous sinuses. No intracranial atherosclerosis. Skull: The visualized skull base, calvarium and extracranial soft tissues are normal. Sinuses/Orbits: No fluid levels or advanced mucosal thickening of the visualized paranasal sinuses. No mastoid or middle ear effusion. The orbits are normal. IMPRESSION: 1. Intraparenchymal hematoma centered in the left thalamus with intraventricular extension and moderate hydrocephalus with transependymal interstitial edema. Critical Value/emergent results were called by telephone at the time of interpretation on 05/14/2021 at 12:58 am to provider [facility] , who verbally acknowledged  these results. Electronically Signed   By: Deatra Robinson M.D.   On: 05/14/2021 00:58   MR MRA HEAD WO CONTRAST  Result Date: 05/14/2021 CLINICAL DATA:  Intracranial hemorrhage EXAM: MRI HEAD WITHOUT CONTRAST MRA HEAD WITHOUT CONTRAST TECHNIQUE: Multiplanar, multi-echo pulse sequences of the brain and surrounding structures were acquired without intravenous contrast. Angiographic images of the Circle of Willis were acquired using MRA technique without intravenous contrast. COMPARISON: No pertinent prior exam. COMPARISON:  Correlation made  with prior head CT FINDINGS: MRI HEAD Brain: Left thalamic hemorrhage is again identified similar to the recent CT with acute blood products. There is mild surrounding edema. Significant intraventricular extension is present as seen on CT with resulting hydrocephalus. Focus susceptibility in the right corona radiata reflects chronic microhemorrhage associated with a lacunar infarct. Additional patchy and confluent areas of T2 hyperintensity in the supratentorial white matter are nonspecific but probably reflect moderate chronic microvascular ischemic changes. No post contrast imaging performed therefore potential underlying lesion is not well evaluated. Vascular: Major vessel flow voids at the skull base are preserved. Skull and upper cervical spine: Normal marrow signal is preserved. Sinuses/Orbits: Paranasal sinuses are aerated. Orbits are unremarkable. Other: Sella is unremarkable.  Mastoid air cells are clear. MRA HEAD Intracranial internal carotid arteries are patent. Middle and anterior cerebral arteries are patent. Intracranial vertebral arteries, basilar artery, posterior cerebral arteries are patent. There is no significant stenosis or aneurysm. No abnormal flow related enhancement in the region of thalamic hemorrhage. IMPRESSION: No substantial change in left thalamic hemorrhage with significant intraventricular extension and hydrocephalus as well as mild edema and mass effect compared to recent CT. 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 Moderate chronic microvascular ischemic changes. Unremarkable MRA. Electronically Signed   By: Guadlupe Spanish M.D.   On: 05/14/2021 11:18   MR BRAIN WO CONTRAST  Result Date: 05/14/2021 CLINICAL DATA:  Intracranial hemorrhage EXAM: MRI HEAD WITHOUT CONTRAST MRA HEAD WITHOUT CONTRAST TECHNIQUE: Multiplanar, multi-echo pulse sequences of the brain and surrounding structures were acquired without intravenous contrast.  Angiographic images of the Circle of Willis were acquired using MRA technique without intravenous contrast. COMPARISON: No pertinent prior exam. COMPARISON:  Correlation made with prior head CT FINDINGS: MRI HEAD Brain: Left thalamic hemorrhage is again identified similar to the recent CT with acute blood products. There is mild surrounding edema. Significant intraventricular extension is present as seen on CT with resulting hydrocephalus. Focus susceptibility in the right corona radiata reflects chronic microhemorrhage associated with a lacunar infarct. Additional patchy and confluent areas of T2 hyperintensity in the supratentorial white matter are nonspecific but probably reflect moderate chronic microvascular ischemic changes. No post contrast imaging performed therefore potential underlying lesion is not well evaluated. Vascular: Major vessel flow voids at the skull base are preserved. Skull and upper cervical spine: Normal marrow signal is preserved. Sinuses/Orbits: Paranasal sinuses are aerated. Orbits are unremarkable. Other: Sella is unremarkable.  Mastoid air cells are clear. MRA HEAD Intracranial internal carotid arteries are patent. Middle and anterior cerebral arteries are patent. Intracranial vertebral arteries, basilar artery, posterior cerebral arteries are patent. There is no significant stenosis or aneurysm. No abnormal flow related enhancement in the region of thalamic hemorrhage. IMPRESSION: No substantial change in left thalamic hemorrhage with significant intraventricular extension and hydrocephalus as well as mild edema and mass effect compared to recent CT. 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 Moderate chronic microvascular ischemic changes. Unremarkable MRA. Electronically  Signed   By: Guadlupe Spanish M.D.   On: 05/14/2021 11:18   DG Pelvis Portable  Result Date: 05/14/2021 CLINICAL DATA:  60 year old female with intracranial hemorrhage.  EXAM: PORTABLE PELVIS 1-2 VIEWS COMPARISON:  None. FINDINGS: Portable AP view at 0346 hours. Mild likely fibroid associated dystrophic calcifications in the pelvis eccentric to the right. Femoral heads normally located. Pelvis and proximal femurs appear intact. No acute osseous abnormality identified. Negative visible bowel gas pattern. IMPRESSION: Negative. Electronically Signed   By: Odessa Fleming M.D.   On: 05/14/2021 06:18   DG CHEST PORT 1 VIEW  Result Date: 05/17/2021 CLINICAL DATA:  Respiratory failure EXAM: PORTABLE CHEST 1 VIEW COMPARISON:  May 14, 2021 FINDINGS: Feeding tube tip is below the diaphragm. No pneumothorax. There is no edema or airspace opacity. There is cardiomegaly with pulmonary vascularity normal. No adenopathy. No bone lesions. IMPRESSION: No edema or airspace opacity. Cardiomegaly, stable. Feeding tube tip below diaphragm. Electronically Signed   By: Bretta Bang III M.D.   On: 05/17/2021 14:17   DG CHEST PORT 1 VIEW  Result Date: 05/14/2021 CLINICAL DATA:  Provided history: Follow-up. EXAM: PORTABLE CHEST 1 VIEW COMPARISON:  None. FINDINGS: Cardiomediastinal silhouette is enlarged, likely accentuated by AP technique. Calcific atherosclerosis of the aorta. Low lung volumes. No consolidation. EKG leads bilaterally. No visible pleural effusions or pneumothorax on this single semi erect AP radiograph. IMPRESSION: 1. Low lung volumes without evidence of acute cardiopulmonary disease. 2. Cardiomegaly. Electronically Signed   By: Feliberto Harts MD   On: 05/14/2021 06:13   DG Abd Portable 1V  Result Date: 05/15/2021 CLINICAL DATA:  Feeding tube placement. EXAM: PORTABLE ABDOMEN - 1 VIEW COMPARISON:  None. FINDINGS: Feeding tube is in place with the tip in the distal stomach directed toward the duodenum. IMPRESSION: As above. Electronically Signed   By: Drusilla Kanner M.D.   On: 05/15/2021 11:40   ECHOCARDIOGRAM COMPLETE  Result Date: 05/16/2021    ECHOCARDIOGRAM REPORT   Patient  Name:   Marissa Morris Date of Exam: 05/14/2021 Medical Rec #:  [id number]    Height: Accession #:    [id number]   Weight:       166.0 lb Date of Birth:  1961/09/09    BSA:          1.783 m Patient Age:    59 years     BP:           122/69 mmHg Patient Gender: F            HR:           77 bpm. Exam Location:  Inpatient Procedure: 2D Echo, Cardiac Doppler and Color Doppler Indications:    Stroke  History:        Patient has no prior history of Echocardiogram examinations.  Sonographer:    Ross Ludwig RDCS (AE) Referring Phys: [phone] ERIC LINDZEN  Sonographer Comments: Patient not responsive to request to sniff for IVC collapse test. IMPRESSIONS  1. Left ventricular ejection fraction, by estimation, is 50 to 55%. The left ventricle has low normal function. The left ventricle has no regional wall motion abnormalities. There is moderate thickening of the septum. The rest of the LV segments demonstrate mild left ventricular hypertrophy. Left ventricular diastolic parameters are consistent with Grade II diastolic dysfunction (pseudonormalization). Elevated left ventricular end-diastolic pressure.  2. The average left ventricular global longitudinal strain is -6.7 %. The global longitudinal strain is abnormal. Strain pattern shows relative apical sparring which may  be suggestive of amyloid in the right clinic context.  3. Right ventricular systolic function is normal. The right ventricular size is normal.  4. The mitral valve is normal in structure. Trivial mitral valve regurgitation. No evidence of mitral stenosis.  5. The aortic valve is tricuspid. There is mild calcification of the aortic valve. There is mild thickening of the aortic valve. Aortic valve regurgitation is not visualized. Mild aortic valve sclerosis is present, with no evidence of aortic valve stenosis.  6. The inferior vena cava is dilated in size with <50% respiratory variability, suggesting right atrial pressure of 15 mmHg.  7. Left atrial size was mildly  dilated. Comparison(s): No prior Echocardiogram. Conclusion(s)/Recommendation(s): No intracardiac source of embolism detected on this transthoracic study. A transesophageal echocardiogram is recommended to exclude cardiac source of embolism if clinically indicated. FINDINGS  Left Ventricle: Left ventricular ejection fraction, by estimation, is 50 to 55%. The left ventricle has low normal function. The left ventricle has no regional wall motion abnormalities. The average left ventricular global longitudinal strain is -6.7 %.  The global longitudinal strain is abnormal. The left ventricular internal cavity size was normal in size. There is moderate thickening of the septum. The rest of the LV segments demonstrate mild left ventricular hypertrophy. Left ventricular diastolic parameters are consistent with Grade II diastolic dysfunction (pseudonormalization). Elevated left ventricular end-diastolic pressure. Right Ventricle: The right ventricular size is normal. No increase in right ventricular wall thickness. Right ventricular systolic function is normal. Left Atrium: Left atrial size was mildly dilated. Right Atrium: Right atrial size was normal in size. Pericardium: There is no evidence of pericardial effusion. Mitral Valve: The mitral valve is normal in structure. There is mild thickening of the mitral valve leaflet(s). There is mild calcification of the mitral valve leaflet(s). Mild mitral annular calcification. Trivial mitral valve regurgitation. No evidence  of mitral valve stenosis. MV peak gradient, 7.1 mmHg. The mean mitral valve gradient is 4.0 mmHg. Tricuspid Valve: The tricuspid valve is normal in structure. Tricuspid valve regurgitation is trivial. No evidence of tricuspid stenosis. Aortic Valve: The aortic valve is tricuspid. There is mild calcification of the aortic valve. There is mild thickening of the aortic valve. Aortic valve regurgitation is not visualized. Mild aortic valve sclerosis is present,  with no evidence of aortic valve stenosis. Aortic valve mean gradient measures 8.0 mmHg. Aortic valve peak gradient measures 16.3 mmHg. Aortic valve area, by VTI measures 2.05 cm. Pulmonic Valve: The pulmonic valve was normal in structure. Pulmonic valve regurgitation is trivial. No evidence of pulmonic stenosis. Aorta: The aortic root and ascending aorta are structurally normal, with no evidence of dilitation. Venous: The inferior vena cava is dilated in size with less than 50% respiratory variability, suggesting right atrial pressure of 15 mmHg. IAS/Shunts: No atrial level shunt detected by color flow Doppler.  LEFT VENTRICLE PLAX 2D LVIDd:         4.70 cm  Diastology LVIDs:         4.20 cm  LV e' medial:    5.55 cm/s LV PW:         1.60 cm  LV E/e' medial:  23.2 LV IVS:        1.80 cm  LV e' lateral:   6.31 cm/s LVOT diam:     1.80 cm  LV E/e' lateral: 20.4 LV SV:         69 LV SV Index:   39       2D Longitudinal Strain LVOT  Area:     2.54 cm 2D Strain GLS Avg:     -6.7 %  RIGHT VENTRICLE             IVC RV Basal diam:  3.50 cm     IVC diam: 2.30 cm RV S prime:     13.20 cm/s TAPSE (M-mode): 3.2 cm LEFT ATRIUM             Index       RIGHT ATRIUM           Index LA diam:        4.10 cm 2.30 cm/m  RA Area:     16.30 cm LA Vol (A2C):   42.1 ml 23.61 ml/m RA Volume:   45.00 ml  25.23 ml/m LA Vol (A4C):   43.6 ml 24.45 ml/m LA Biplane Vol: 47.3 ml 26.52 ml/m  AORTIC VALVE AV Area (Vmax):    1.86 cm AV Area (Vmean):   2.09 cm AV Area (VTI):     2.05 cm AV Vmax:           202.00 cm/s AV Vmean:          135.000 cm/s AV VTI:            0.336 m AV Peak Grad:      16.3 mmHg AV Mean Grad:      8.0 mmHg LVOT Vmax:         148.00 cm/s LVOT Vmean:        111.000 cm/s LVOT VTI:          0.271 m LVOT/AV VTI ratio: 0.81  AORTA Ao Root diam: 2.90 cm Ao Asc diam:  3.20 cm MITRAL VALVE MV Area (PHT): 3.08 cm     SHUNTS MV Area VTI:   1.83 cm     Systemic VTI:  0.27 m MV Peak grad:  7.1 mmHg     Systemic Diam: 1.80 cm  MV Mean grad:  4.0 mmHg MV Vmax:       1.33 m/s MV Vmean:      90.6 cm/s MV Decel Time: 246 msec MV E velocity: 129.00 cm/s MV A velocity: 118.00 cm/s MV E/A ratio:  1.09 Laurance Flatten MD Electronically signed by Laurance Flatten MD Signature Date/Time: 05/16/2021/7:51:23 AM    Final    VAS US CAROTID  Result Date: 05/15/2021 Carotid Arterial Duplex Study Patient Name:  Marissa Morris  Date of Exam:   05/14/2021 Medical Rec #: [id number]     Accession #:    [id number] Date of Birth: 1960-12-26     Patient Gender: F Patient Age:   47Y Exam Location:  [facility] Procedure:      VAS US CAROTID Referring Phys: [id number] Marvel Plan --------------------------------------------------------------------------------  Indications:       Hypertensive emergency (122/128). 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 Risk Factors:      Hypertension. Comparison Study:  No prior study on file Performing Technologist: Sherren Kerns RVS  Examination Guidelines: A complete evaluation includes B-mode imaging, spectral Doppler, color Doppler, and power Doppler as needed of all accessible portions of each vessel. Bilateral testing is considered an integral part of a complete examination. Limited examinations for reoccurring indications may be performed as noted.  Right Carotid Findings: +----------+--------+--------+--------+------------------+--------+           PSV cm/sEDV cm/sStenosisPlaque DescriptionComments +----------+--------+--------+--------+------------------+--------+ CCA  Prox  96      9                                          +----------+--------+--------+--------+------------------+--------+ CCA Distal104     13                                         +----------+--------+--------+--------+------------------+--------+ ICA Prox  66      15      1-39%   heterogenous               +----------+--------+--------+--------+------------------+--------+ ICA  Distal54      13                                         +----------+--------+--------+--------+------------------+--------+ ECA       151     22                                         +----------+--------+--------+--------+------------------+--------+ +----------+--------+-------+--------+-------------------+           PSV cm/sEDV cmsDescribeArm Pressure (mmHG) +----------+--------+-------+--------+-------------------+ Subclavian150                                        +----------+--------+-------+--------+-------------------+ +---------+--------+--+--------+--+---------+ VertebralPSV cm/s41EDV cm/s12Antegrade +---------+--------+--+--------+--+---------+  Left Carotid Findings: +----------+--------+--------+--------+------------------+------------------+           PSV cm/sEDV cm/sStenosisPlaque DescriptionComments           +----------+--------+--------+--------+------------------+------------------+ CCA Prox  180     15                                                   +----------+--------+--------+--------+------------------+------------------+ CCA Distal98      13                                intimal thickening +----------+--------+--------+--------+------------------+------------------+ ICA Prox  70      22      1-39%                     intimal thickening +----------+--------+--------+--------+------------------+------------------+ ICA Distal68      22                                                   +----------+--------+--------+--------+------------------+------------------+ ECA       91      18                                                   +----------+--------+--------+--------+------------------+------------------+ +----------+--------+--------+--------+-------------------+  PSV cm/sEDV cm/sDescribeArm Pressure (mmHG) +----------+--------+--------+--------+-------------------+ Subclavian87                                           +----------+--------+--------+--------+-------------------+ +---------+--------+--+--------+--+---------+ VertebralPSV cm/s61EDV cm/s13Antegrade +---------+--------+--+--------+--+---------+   Summary: Right Carotid: Velocities in the right ICA are consistent with a 1-39% stenosis. Left Carotid: Velocities in the left ICA are consistent with a 1-39% stenosis. Vertebrals: Bilateral vertebral arteries demonstrate antegrade flow. *See table(s) above for measurements and observations.  Electronically signed by Marvel Plan MD on 05/15/2021 at 2:54:40 PM.    Final       PHYSICAL EXAM Middle-aged African-American lady not in distress Temp:  [98.6 F (37 C)-99.3 F (37.4 C)] 98.6 F (37 C) (06/11 0400) Pulse Rate:  [73-99] 75 (06/11 0900) Resp:  [14-24] 17 (06/11 0900) BP: (125-188)/(68-99) 167/96 (06/11 0900) SpO2:  [92 %-96 %] 94 % (06/11 0900) Cardiovascular - Regular rhythm and rate. Neurological Exam  -drowsy but can arouse partially with stimulation but continues to have expressive greater than receptive aphasia and speaks only occasional words.  Cannot speak sentences. Follows simple commands such as sticking out tongue and lifting left hand. Left gaze preference barely cross midline, not blinking to visual threat consistently, PERRL. Right facial droop. Tongue protrusion not cooperative. Spontaneously moving LUE against gravity, no drift, flaccid on the RUE however with pain, bicep 3-/5. Withdraw to pain LLE 3/5, but only mild withdraw 2/5 at RLE. Sensation, coordination not cooperative and gait not tested.  ASSESSMENT/PLAN Marissa Morris is a 60 y.o. female with no significant history admitted for N/V, right sided weakness and facial droop. No tPA given due to ICH.    ICH with IVH:  Left thalamic ICH with extensive IVH -  secondary to hypertensive emergency CT head left thalamic ICH with IVH Repeat CT head stable ICH and ventricles MRI  Stable ICH and  ventricles MRA  Unremarkable.  Carotid Doppler  <50% BL 2D Echo  Normal EF 50-55%, left ventricular global longitudinal strain is  apical sparring which may be suggestive of amyloid in the right clinic  context.   LDL 121 HgbA1c 5.7 SCDs for VTE prophylaxis, can start chemical prophylaxis when drain is off No antithrombotic prior to admission, now on No antithrombotic due to ICH Ongoing aggressive stroke risk factor management Therapy recommendations:  CIR Disposition:  Pending   Hydrocephalus  Slightly enlarged ventricles  Repeat CT and MRI showed stable ventricle size S/p EVD yesterday, draining well 10cc/h Repeat CT 6/3 mildly decreased ventricle size On keppra  bid for seizure prophylaxis  Close monitoring  Hypertensive emergency  HCTZ 13 mg daily Lisinoril 20 mg daily  Carvedilol 12.5 bid was discontinued on 6/9 and she was started on Labetalol 100 TID for better blood pressure control. Increased to 200 TID on 05/22/21 as blood pressure continue to be elevated in the 140-170 range.  On labetalol PRN Maintain SBP <160 for now, continue to titrate Clevidipine  Long term BP goal normotensive  Hyperlipidemia Home meds:  none  LDL 121, goal < 70 Consider statin at discharge  Dysphagia  Did not pass swallow NPO now Cortrak   Other Active Problems #No overnight fever  #Leukocytosis  Fever noted overnight on 6/7. She continues to fever sporadically. No clear infectious source has been identified at this time. White count is stable in the 10-12 range.  - ICU assisted with fever work  up; vaginal swab/culture was sent to lab on 6/7 resulted as negative. She received Ceftriaxone x1, was briefly placed on doxycycline and remains on metronidazole empirically( Metronidazole started 6/7).  Hospital day # 9   Patient is doing well and blood pressure is adequately controlled.  Ventriculostomy was discontinued yesterday. She is stable and may be transferred out of the ICU to the  floor. Continue strict blood pressure control with SBP<160.  Continue oral blood pressure medications to core track tube. Continue ongoing therapy consults.   This patient is critically ill and at significant risk of neurological worsening, death and care requires constant monitoring of vital signs, hemodynamics,respiratory and cardiac monitoring, extensive review of multiple databases, frequent neurological assessment, discussion with family, other specialists and medical decision making of high complexity.I have made any additions or clarifications directly to the above note.This critical care time does not reflect procedure time, or teaching time or supervisory time of PA/NP/Med Resident etc but could involve care discussion time.   I spent 30 minutes of neurocritical care time  in the care of  this patient.  Marisue Humble, MD Page: [phone] Stroke Neurology 05/23/2021 9:33 AM  To contact Stroke Continuity provider, please refer to [email or web address]. After hours, contact General Neurology

## 2021-05-23 NOTE — Progress Notes (Signed)
Updated patient's sisters Imogene Burn and Bonita Quin) that patient is transferring out of ICU to 3W10 and gave the unit telephone number to [facility]. Only belongings noted at bedside is one pair of white socks.

## 2021-05-23 NOTE — Progress Notes (Signed)
Overall looks better.  More awake.  Following some simple commands.  Status post thalamic hemorrhage.  Continue management per neurology.  No new recommendations from our standpoint.

## 2021-05-23 NOTE — Progress Notes (Addendum)
PROGRESS NOTE    Lanett Yonkers   GEX:[id number]  DOB: June 07, 1961  DOA: 05/13/2021 PCP: Pcp, No   Brief Narrative:  Vicki Haverstick this is a 60 year old female with hypertension who presented to the hospital for right-sided weakness, vomiting and a headache. In the ED: BP 222/128, CT of the head showed thalamic hemorrhage with intraventricular extension and mild hydrocephalus On 6/3 she underwent placement of a right frontal EVD for hydrocephalus which was removed on 6/10    Subjective: Sleepy, noncommunicative    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 thought to be secondary to hypertensive emergency -With right-sided hemiplegia receptive aphasia, left gaze preference and right facial droop - Continues to be drowsy on exam-according to prior notes, she has been minimally  Verbal -A1c 5.7 -LDL 121 -Carotid duplex did not show any significant stenosis -On Keppra twice daily for seizure prophylaxis -Cleviprex stopped 6/8-currently on HCTZ, amlodipine, labetalol and as needed IV labetalol-DC HCTZ which is a diuretic and control fluid balance with tube feeds - Add low-dose hydralazine - Stroke team is following  Active Problems:   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 - sleepy and minimally verbal  Dysphagia -Due to CVA-has a core track and is on Osmolite 60 cc an hour  Fever - Apparently she had some vaginal discharge which was sent to the lab-it was negative for chlamydia, gonorrhea, trichomonas, candida and Gardnerella -No other source for fever was found - received about 3 days of doxycycline and 4 days of Flagyl- will stop Flagyl today - follow temps- WBC is 14 today  Chronic grade 2 diastolic heart failure - Continue to follow fluid status  Hyperlipidemia - Hold off on statin for now  Glucose intolerance -A1c 5.7 - Continue checking sugars   Vaginal discharge -Resolved per nurse  Time  spent in minutes: 40 DVT prophylaxis: heparin injection 5,000 Units Start: 05/18/21 1400 SCDs Start: 05/18/21 1232   Code Status: Full code Family Communication:  Level of Care: Level of care: Progressive Disposition Plan:  Status is: Inpatient  Remains inpatient appropriate because:Inpatient level of care appropriate due to severity of illness  Dispo: The patient is from: Home              Anticipated d/c is to: CIR              Patient currently is not medically stable to d/c.   Difficult to place patient No      Consultants:  Neurology Neurosurgery Pulmonary critical care Procedures:  EVD Antimicrobials:  Anti-infectives (From admission, onward)    Start     Dose/Rate Route Frequency Ordered Stop   05/19/21 1430  metroNIDAZOLE (FLAGYL) 50 mg/ml oral suspension 500 mg  Status:  Discontinued        500 mg Per Tube 2 times daily 05/19/21 1336 05/23/21 0804   05/19/21 1430  cefTRIAXone (ROCEPHIN) injection 500 mg        500 mg Intramuscular  Once 05/19/21 1336 05/19/21 1454   05/19/21 1430  doxycycline (VIBRA-TABS) tablet 100 mg  Status:  Discontinued        100 mg Per Tube Every 12 hours 05/19/21 1336 05/21/21 1038        Objective: Vitals:   05/23/21 1000 05/23/21 1100 05/23/21 1200 05/23/21 1300  BP: (!) 152/97 (!) 158/84 (!) 153/80 (!) 153/89  Pulse: 85 81 74 85  Resp: 17 18 20 18   Temp:   99.3  F (37.4 C)   TempSrc:   Axillary   SpO2: 95% 97% 92% 94%  Weight:        Intake/Output Summary (Last 24 hours) at 05/23/2021 1343 Last data filed at 05/23/2021 1300 Gross per 24 hour  Intake 1720 ml  Output 1400 ml  Net 320 ml   Filed Weights   05/17/21 0400 05/18/21 0500 05/22/21 0400  Weight: 80.9 kg 77.7 kg 79.1 kg    Examination: General exam: Appears comfortable  HEENT: PERRLA, oral mucosa moist, no sclera icterus or thrush Respiratory system: Clear to auscultation. Respiratory effort normal. Cardiovascular system: S1 & S2 heard, RRR.    Gastrointestinal system: Abdomen soft, non-tender, nondistended. Normal bowel sounds. Central nervous system: Alert and oriented. No focal neurological deficits. Extremities: No cyanosis, clubbing or edema Skin: No rashes or ulcers Psychiatry:  Mood & affect appropriate.     Data Reviewed: I have personally reviewed following labs and imaging studies  CBC: Recent Labs  Lab 05/18/21 0444 05/19/21 0347 05/20/21 0532 05/21/21 0259 05/23/21 0717  WBC 10.9* 11.0* 12.8* 12.0* 14.3*  HGB 14.1 14.1 14.8 13.8 12.9  HCT 43.7 43.6 44.5 42.3 40.0  MCV 98.9 97.5 97.8 97.0 97.6  PLT 184 201 201 202 246   Basic Metabolic Panel: Recent Labs  Lab 05/16/21 1659 05/17/21 0318 05/18/21 0444 05/19/21 0347 05/20/21 0532 05/21/21 0259 05/23/21 0717  NA  --    < > 138 137 136 135 135  K  --    < > 3.7 4.0 3.8 3.8 4.3  CL  --    < > 100 99 98 97* 100  CO2  --    < > 29 28 27 25 26   GLUCOSE  --    < > 105* 148* 138* 158* 184*  BUN  --    < > 25* 26* 24* 32* 28*  CREATININE  --    < > 0.82 0.81 0.70 0.78 0.72  CALCIUM  --    < > 8.4* 8.6* 8.4* 8.3* 8.6*  MG 2.4  --   --   --   --  2.6*  --   PHOS 4.3  --   --   --   --  4.2  --    < > = values in this interval not displayed.   GFR: CrCl cannot be calculated (Unknown ideal weight.). Liver Function Tests: No results for input(s): AST, ALT, ALKPHOS, BILITOT, PROT, ALBUMIN in the last 168 hours. No results for input(s): LIPASE, AMYLASE in the last 168 hours. No results for input(s): AMMONIA in the last 168 hours. Coagulation Profile: No results for input(s): INR, PROTIME in the last 168 hours. Cardiac Enzymes: No results for input(s): CKTOTAL, CKMB, CKMBINDEX, TROPONINI in the last 168 hours. BNP (last 3 results) No results for input(s): PROBNP in the last 8760 hours. HbA1C: No results for input(s): HGBA1C in the last 72 hours. CBG: Recent Labs  Lab 05/22/21 1945 05/22/21 2351 05/23/21 0353 05/23/21 0832 05/23/21 1202  GLUCAP  195* 160* 158* 175* 176*   Lipid Profile: Recent Labs    05/23/21 0717  TRIG 91   Thyroid Function Tests: No results for input(s): TSH, T4TOTAL, FREET4, T3FREE, THYROIDAB in the last 72 hours. Anemia Panel: No results for input(s): VITAMINB12, FOLATE, FERRITIN, TIBC, IRON, RETICCTPCT in the last 72 hours. Urine analysis:    Component Value Date/Time   COLORURINE YELLOW 05/17/2021 1123   APPEARANCEUR CLEAR 05/17/2021 1123   LABSPEC 1.021 05/17/2021 1123  PHURINE 5.0 05/17/2021 1123   GLUCOSEU NEGATIVE 05/17/2021 1123   HGBUR NEGATIVE 05/17/2021 1123   BILIRUBINUR NEGATIVE 05/17/2021 1123   KETONESUR NEGATIVE 05/17/2021 1123   PROTEINUR NEGATIVE 05/17/2021 1123   NITRITE NEGATIVE 05/17/2021 1123   LEUKOCYTESUR NEGATIVE 05/17/2021 1123   Sepsis Labs: @LABRCNTIP (procalcitonin:4,lacticidven:4) ) Recent Results (from the past 240 hour(s))  Resp Panel by RT-PCR (Flu A&B, Covid) Nasopharyngeal Swab     Status: None   Collection Time: 05/13/21 11:12 PM   Specimen: Nasopharyngeal Swab; Nasopharyngeal(NP) swabs in vial transport medium  Result Value Ref Range Status   SARS Coronavirus 2 by RT PCR NEGATIVE NEGATIVE Final    Comment: (NOTE) SARS-CoV-2 target nucleic acids are NOT DETECTED.  The SARS-CoV-2 RNA is generally detectable in upper respiratory specimens during the acute phase of infection. The lowest concentration of SARS-CoV-2 viral copies this assay can detect is 138 copies/mL. A negative result does not preclude SARS-Cov-2 infection and should not be used as the sole basis for treatment or other patient management decisions. A negative result may occur with  improper specimen collection/handling, submission of specimen other than nasopharyngeal swab, presence of viral mutation(s) within the areas targeted by this assay, and inadequate number of viral copies(<138 copies/mL). A negative result must be combined with clinical observations, patient history, and  epidemiological information. The expected result is Negative.  Fact Sheet for Patients:  BloggerCourse.comhttps://www.fda.gov/media/152166/download  Fact Sheet for Healthcare Providers:  SeriousBroker.ithttps://www.fda.gov/media/152162/download  This test is no t yet approved or cleared by the [place] FDA and  has been authorized for detection and/or diagnosis of SARS-CoV-2 by FDA under an Emergency Use Authorization (EUA). This EUA will remain  in effect (meaning this test can be used) for the duration of the COVID-19 declaration under Section 564(b)(1) of the Act, 21 U.S.C.section 360bbb-3(b)(1), unless the authorization is terminated  or revoked sooner.       Influenza A by PCR NEGATIVE NEGATIVE Final   Influenza B by PCR NEGATIVE NEGATIVE Final    Comment: (NOTE) The Xpert Xpress SARS-CoV-2/FLU/RSV plus assay is intended as an aid in the diagnosis of influenza from Nasopharyngeal swab specimens and should not be used as a sole basis for treatment. Nasal washings and aspirates are unacceptable for Xpert Xpress SARS-CoV-2/FLU/RSV testing.  Fact Sheet for Patients: BloggerCourse.comhttps://www.fda.gov/media/152166/download  Fact Sheet for Healthcare Providers: SeriousBroker.ithttps://www.fda.gov/media/152162/download  This test is not yet approved or cleared by the [place] FDA and has been authorized for detection and/or diagnosis of SARS-CoV-2 by FDA under an Emergency Use Authorization (EUA). This EUA will remain in effect (meaning this test can be used) for the duration of the COVID-19 declaration under Section 564(b)(1) of the Act, 21 U.S.C. section 360bbb-3(b)(1), unless the authorization is terminated or revoked.  Performed at [facility], [address] [place], [place] [address]   MRSA PCR Screening     Status: None   Collection Time: 05/14/21  2:54 AM   Specimen: Nasopharyngeal  Result Value Ref Range Status   MRSA by PCR NEGATIVE NEGATIVE Final    Comment:        The GeneXpert MRSA Assay  (FDA approved for NASAL specimens only), is one component of a comprehensive MRSA colonization surveillance program. It is not intended to diagnose MRSA infection nor to guide or monitor treatment for MRSA infections. Performed at [facility], [address] [place], [place] [address]          Radiology Studies: No results found.    Scheduled  Meds:  amLODipine  10 mg Per Tube Daily   chlorhexidine  15 mL Mouth Rinse BID   Chlorhexidine Gluconate Cloth  6 each Topical Daily   feeding supplement (PROSource TF)  45 mL Per Tube BID   heparin  5,000 Units Subcutaneous Q8H   hydrochlorothiazide  25 mg Per Tube Daily   insulin aspart  0-6 Units Subcutaneous Q4H   labetalol  200 mg Per Tube TID   lisinopril  40 mg Per Tube Daily   magic mouthwash  1 mL Oral TID   mouth rinse  15 mL Mouth Rinse q12n4p   senna-docusate  1 tablet Per Tube BID   Continuous Infusions:  feeding supplement (OSMOLITE 1.2 CAL) 1,000 mL (05/23/21 0616)     LOS: 9 days      Calvert Cantor, MD [facility] Pager: [email or web address] 05/23/2021, 1:43 PM

## 2021-05-24 ENCOUNTER — Inpatient Hospital Stay (HOSPITAL_COMMUNITY): Payer: BLUE CROSS/BLUE SHIELD

## 2021-05-24 LAB — GLUCOSE, CAPILLARY
Glucose-Capillary: 165 mg/dL — ABNORMAL HIGH (ref 70–99)
Glucose-Capillary: 184 mg/dL — ABNORMAL HIGH (ref 70–99)
Glucose-Capillary: 129 mg/dL — ABNORMAL HIGH (ref 70–99)
Glucose-Capillary: 168 mg/dL — ABNORMAL HIGH (ref 70–99)
Glucose-Capillary: 156 mg/dL — ABNORMAL HIGH (ref 70–99)
Glucose-Capillary: 150 mg/dL — ABNORMAL HIGH (ref 70–99)

## 2021-05-24 LAB — URINALYSIS, ROUTINE W REFLEX MICROSCOPIC
Bilirubin Urine: NEGATIVE
Glucose, UA: 50 mg/dL — AB
Hgb urine dipstick: NEGATIVE
Ketones, ur: NEGATIVE mg/dL
Leukocytes,Ua: NEGATIVE
Nitrite: NEGATIVE
Protein, ur: NEGATIVE mg/dL
Specific Gravity, Urine: 1.028 (ref 1.005–1.030)
pH: 5 (ref 5.0–8.0)

## 2021-05-24 LAB — COMPREHENSIVE METABOLIC PANEL WITH GFR
ALT: 33 U/L (ref 0–44)
AST: 32 U/L (ref 15–41)
Albumin: 3.3 g/dL — ABNORMAL LOW (ref 3.5–5.0)
Alkaline Phosphatase: 62 U/L (ref 38–126)
Anion gap: 11 (ref 5–15)
BUN: 33 mg/dL — ABNORMAL HIGH (ref 6–20)
CO2: 24 mmol/L (ref 22–32)
Calcium: 8.6 mg/dL — ABNORMAL LOW (ref 8.9–10.3)
Chloride: 97 mmol/L — ABNORMAL LOW (ref 98–111)
Creatinine, Ser: 0.66 mg/dL (ref 0.44–1.00)
GFR, Estimated: >60 mL/min
Glucose, Bld: 164 mg/dL — ABNORMAL HIGH (ref 70–99)
Potassium: 4.7 mmol/L (ref 3.5–5.1)
Sodium: 132 mmol/L — ABNORMAL LOW (ref 135–145)
Total Bilirubin: 0.6 mg/dL (ref 0.3–1.2)
Total Protein: 6.8 g/dL (ref 6.5–8.1)

## 2021-05-24 LAB — CBC
HCT: 39.8 % (ref 36.0–46.0)
Hemoglobin: 12.9 g/dL (ref 12.0–15.0)
MCH: 32.1 pg (ref 26.0–34.0)
MCHC: 32.4 g/dL (ref 30.0–36.0)
MCV: 99 fL (ref 80.0–100.0)
Platelets: 245 10*3/uL (ref 150–400)
RBC: 4.02 MIL/uL (ref 3.87–5.11)
RDW: 13.2 % (ref 11.5–15.5)
WBC: 16.5 10*3/uL — ABNORMAL HIGH (ref 4.0–10.5)
nRBC: 0 % (ref 0.0–0.2)

## 2021-05-24 IMAGING — DX DG CHEST 1V PORT
1 series · 1 of 1 positions shown · non-contrast
Comparison: [DATE]

CLINICAL DATA: CVA.  Shortness of breath.

EXAM:
PORTABLE CHEST 1 VIEW

[chest ap]
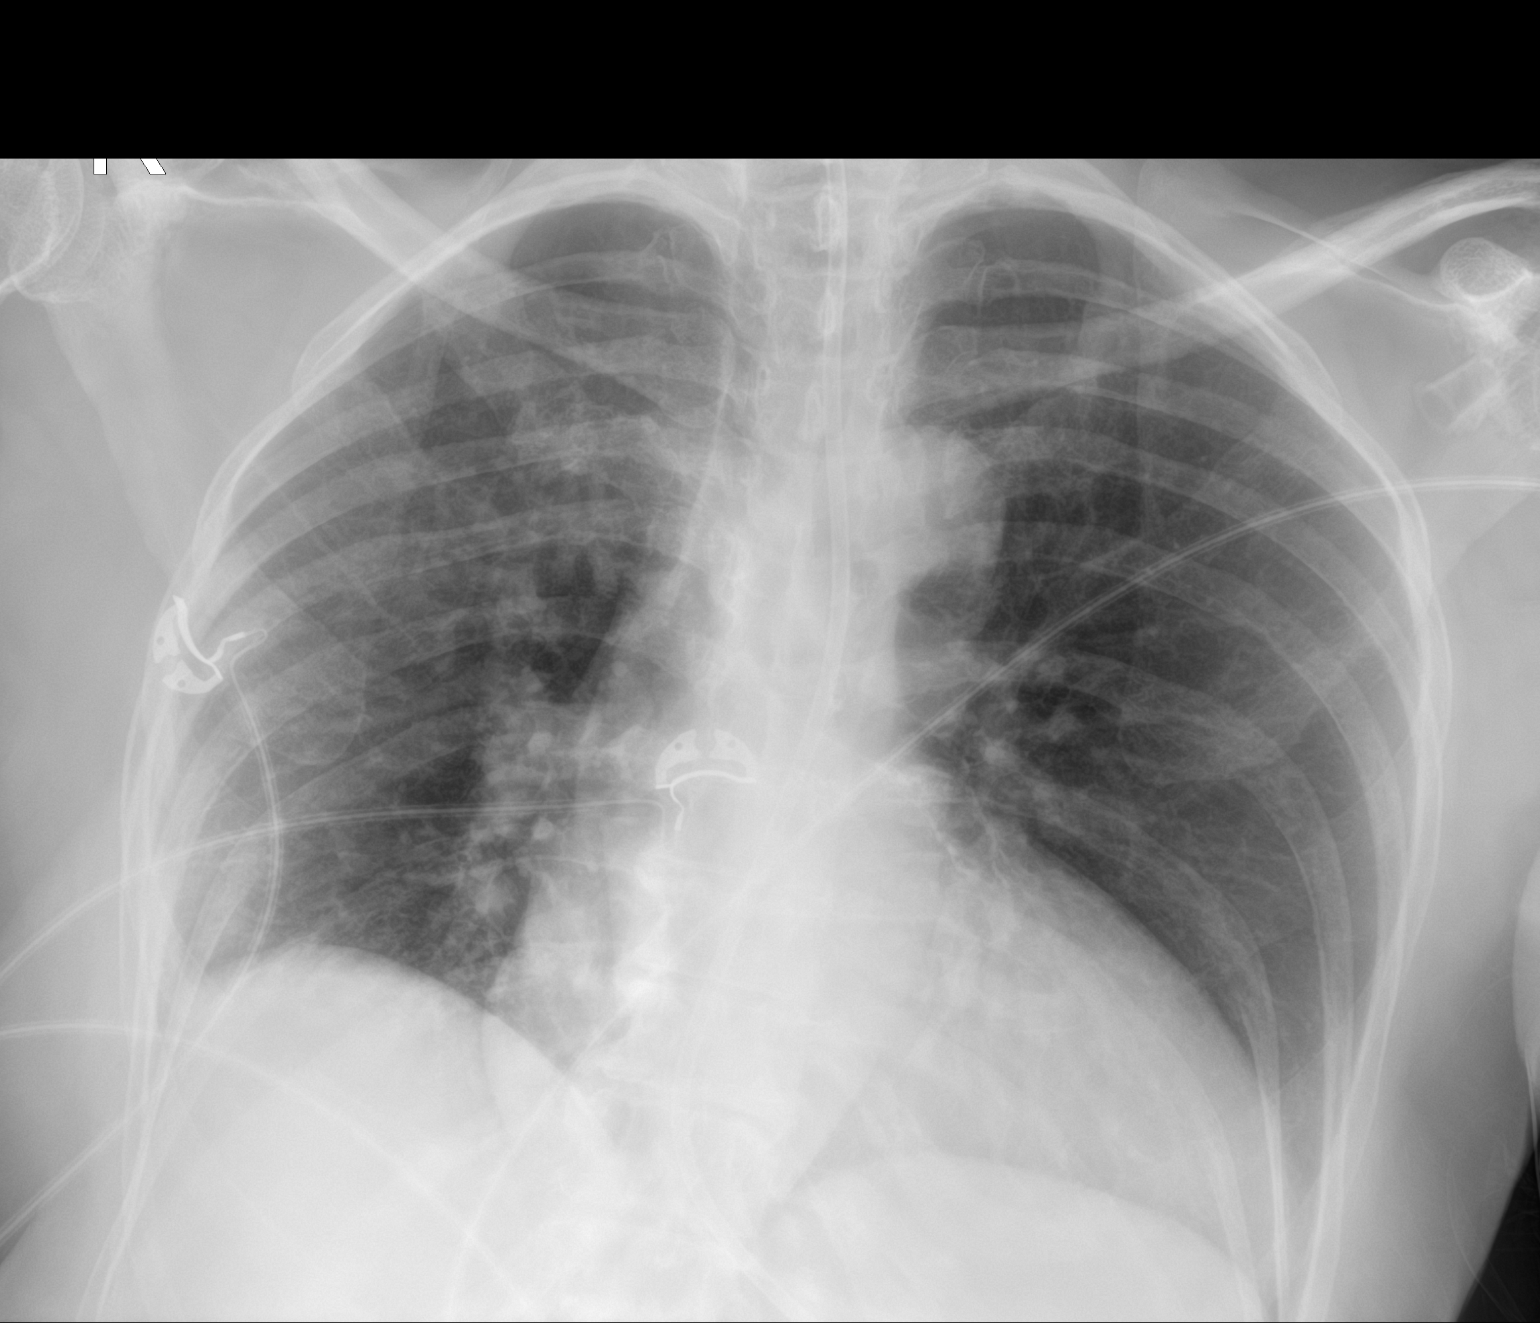

[1 of 1 positions shown; findings below may reference images not displayed]

FINDINGS: Feeding tube extends beyond the inferior aspect of the film. Midline
trachea. Moderate cardiomegaly. Mild right hemidiaphragm elevation.
The left costophrenic angle is minimally excluded. No pleural
effusion or pneumothorax. No congestive failure. No lobar
consolidation.
IMPRESSION: Cardiomegaly without congestive failure.

## 2021-05-24 MED ORDER — HYDRALAZINE HCL 10 MG PO TABS
20.0000 mg | ORAL_TABLET | Freq: Three times a day (TID) | ORAL | Status: DC
  Administered 2021-05-24 – 2021-05-27 (×8): 20 mg
  Filled 2021-05-24 (×8): qty 2

## 2021-05-24 NOTE — Plan of Care (Signed)
  Problem: Education: Goal: Knowledge of disease or condition will improve Outcome: Progressing Goal: Knowledge of secondary prevention will improve Outcome: Progressing Goal: Knowledge of patient specific risk factors addressed and post discharge goals established will improve Outcome: Progressing   Problem: Education: Goal: Knowledge of secondary prevention will improve Outcome: Progressing Goal: Knowledge of patient specific risk factors addressed and post discharge goals established will improve Outcome: Progressing Goal: Individualized Educational Video(s) Outcome: Progressing   Problem: Coping: Goal: Will verbalize positive feelings about self Outcome: Progressing Goal: Will identify appropriate support needs Outcome: Progressing   Problem: Health Behavior/Discharge Planning: Goal: Ability to manage health-related needs will improve Outcome: Progressing   Problem: Self-Care: Goal: Ability to participate in self-care as condition permits will improve Outcome: Progressing Goal: Verbalization of feelings and concerns over difficulty with self-care will improve Outcome: Progressing Goal: Ability to communicate needs accurately will improve Outcome: Progressing   Problem: Nutrition: Goal: Risk of aspiration will decrease Outcome: Progressing Goal: Dietary intake will improve Outcome: Progressing   Problem: Intracerebral Hemorrhage Tissue Perfusion: Goal: Complications of Intracerebral Hemorrhage will be minimized Outcome: Progressing   Problem: Safety: Goal: Non-violent Restraint(s) Outcome: Progressing   Problem: Education: Goal: Knowledge of General Education information will improve Description: Including pain rating scale, medication(s)/side effects and non-pharmacologic comfort measures Outcome: Progressing   Problem: Health Behavior/Discharge Planning: Goal: Ability to manage health-related needs will improve Outcome: Progressing   Problem: Clinical  Measurements: Goal: Ability to maintain clinical measurements within normal limits will improve Outcome: Progressing Goal: Will remain free from infection Outcome: Progressing Goal: Diagnostic test results will improve Outcome: Progressing Goal: Respiratory complications will improve Outcome: Progressing Goal: Cardiovascular complication will be avoided Outcome: Progressing   Problem: Activity: Goal: Risk for activity intolerance will decrease Outcome: Progressing   Problem: Nutrition: Goal: Adequate nutrition will be maintained Outcome: Progressing   Problem: Coping: Goal: Level of anxiety will decrease Outcome: Progressing   Problem: Elimination: Goal: Will not experience complications related to bowel motility Outcome: Progressing Goal: Will not experience complications related to urinary retention Outcome: Progressing   Problem: Pain Managment: Goal: General experience of comfort will improve Outcome: Progressing   Problem: Safety: Goal: Ability to remain free from injury will improve Outcome: Progressing   Problem: Skin Integrity: Goal: Risk for impaired skin integrity will decrease Outcome: Progressing   

## 2021-05-24 NOTE — Progress Notes (Signed)
   Providing Compassionate, Quality Care - Together   Subjective: Patient reports some abdominal pain. She denies nausea, vomiting, or constipation. She nods when I ask if she's had a recent BM. It appears to be related to tenderness caused by heparin injections.  Objective: Vital signs in last 24 hours: Temp:  [97.8 F (36.6 C)-99.3 F (37.4 C)] 97.8 F (36.6 C) (06/12 0724) Pulse Rate:  [74-91] 75 (06/12 0724) Resp:  [15-20] 19 (06/12 0724) BP: (118-158)/(71-89) 157/71 (06/12 0724) SpO2:  [92 %-99 %] 99 % (06/12 0724) Weight:  [76.5 kg] 76.5 kg (06/12 0500)  Intake/Output from previous day: 06/11 0701 - 06/12 0700 In: 1497 [NG/GT:1497] Out: 650 [Urine:650] Intake/Output this shift: No intake/output data recorded.  Patient responds to voice Follows commands Strength and sensation intact LUE, LLE Wiggles toes on command at RLE No movement at RUE, patient nods when asked if she can feel me touching her right arm Abdomen soft, small ecchymosis from heparin injections   Lab Results: Recent Labs    05/23/21 0717 05/24/21 0227  WBC 14.3* 16.5*  HGB 12.9 12.9  HCT 40.0 39.8  PLT 246 245   BMET Recent Labs    05/23/21 0717  NA 135  K 4.3  CL 100  CO2 26  GLUCOSE 184*  BUN 28*  CREATININE 0.72  CALCIUM 8.6*    Studies/Results: DG Chest Port 1 View  Result Date: 05/24/2021 CLINICAL DATA:  CVA.  Shortness of breath. EXAM: PORTABLE CHEST 1 VIEW COMPARISON:  05/17/2021 FINDINGS: Feeding tube extends beyond the inferior aspect of the film. Midline trachea. Moderate cardiomegaly. Mild right hemidiaphragm elevation. The left costophrenic angle is minimally excluded. No pleural effusion or pneumothorax. No congestive failure. No lobar consolidation. IMPRESSION: Cardiomegaly without congestive failure. Electronically Signed   By: Jeronimo Greaves M.D.   On: 05/24/2021 09:14    Assessment/Plan: Patient is ten days status post large left thalamic hemorrhage and IVH with  ventriculomegaly. This is believed to be hypertensive in nature. She underwent a right frontal ventriculostomy catheter placement by Dr. Maisie Fus. EVD was removed on 05/22/2021.   LOS: 10 days   -Continue to mobilize with therapies -Possible discharge to CIR when medically appropriate -No new Neurosurgical recommendations at this time   Val Eagle, DNP, AGNP-C Nurse Practitioner  [facility] [address], [place], [place] [address] P: [phone]    F: [phone]  05/24/2021, 10:44 AM

## 2021-05-24 NOTE — Progress Notes (Signed)
PROGRESS NOTE    Marissa Morris   DQQ:[id number]  DOB: 06-12-1961  DOA: 05/13/2021 PCP: Pcp, No   Brief Narrative:  Marissa Morris this is a 60 year old female with hypertension who presented to the hospital for right-sided weakness, vomiting and a headache. In the ED: BP 222/128, CT of the head showed a right thalamic hemorrhage with intraventricular extension and mild hydrocephalus On 6/3 she underwent placement of a right frontal EVD for hydrocephalus which was removed on 6/10    Subjective: Sleepy. Does not answer when ask about complaints.    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 thought to be secondary to hypertensive emergency -With right-sided hemiplegia receptive aphasia, left gaze preference and right facial droop - Continues to be drowsy on exam-according to prior notes, she has been minimally  Verbal- she give an occasional one word answer to me-follows very limited commands and falls asleep quickly -A1c 5.7 -LDL 121 -Carotid duplex did not show any significant stenosis - last CT imaging on 6/9 reveals >"Decreasing intraventricular clot" -On Keppra twice daily for seizure prophylaxis -Cleviprex stopped 6/8- d/c'd HCTZ 6/11 which is a diuretic - control fluid balance with tube feeds - currently on hydralazine, amlodipine, labetalol and as needed IV labetalol - Stroke team is following  Active Problems:   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 - sleepy and minimally verbal - see infection w/u below  Dysphagia -Due to CVA-has a core track and is on Osmolite 60 cc an hour  Fever and leukocytosis - Apparently she had some vaginal discharge which was sent to the lab-it was negative for chlamydia, gonorrhea, trichomonas, candida and Gardnerella -No other source for fever was found - received about 3 days of doxycycline and 4 days of Flagyl-  stopped Flagyl 6/11 - last p was  101.3 on 6/9- WBC has risen to 16 today- obtain UA, CXR, Cmet (LFTs) and blood cultures to complete an infection work up  Chronic grade 2 diastolic heart failure - Continue to follow fluid status  Hyperlipidemia - Hold off on statin for now  Glucose intolerance -A1c 5.7 - Continue checking sugars   Vaginal discharge -Resolved per nurse  Time spent in minutes: 40 DVT prophylaxis: heparin injection 5,000 Units Start: 05/18/21 1400 SCDs Start: 05/18/21 1232   Code Status: Full code Family Communication:  Level of Care: Level of care: Progressive Disposition Plan:  Status is: Inpatient  Remains inpatient appropriate because:Inpatient level of care appropriate due to severity of illness  Dispo: The patient is from: Home              Anticipated d/c is to: CIR              Patient currently is not medically stable to d/c.   Difficult to place patient No      Consultants:  Neurology Neurosurgery Pulmonary critical care Procedures:  EVD NG tube Antimicrobials:  Anti-infectives (From admission, onward)    Start     Dose/Rate Route Frequency Ordered Stop   05/19/21 1430  metroNIDAZOLE (FLAGYL) 50 mg/ml oral suspension 500 mg  Status:  Discontinued        500 mg Per Tube 2 times daily 05/19/21 1336 05/23/21 0804   05/19/21 1430  cefTRIAXone (ROCEPHIN) injection 500 mg        500 mg Intramuscular  Once 05/19/21 1336 05/19/21 1454   05/19/21 1430  doxycycline (VIBRA-TABS) tablet 100 mg  Status:  Discontinued        100 mg Per Tube Every 12 hours 05/19/21 1336 05/21/21 1038        Objective: Vitals:   05/23/21 2312 05/24/21 0359 05/24/21 0500 05/24/21 0724  BP: (!) 150/80 126/75  (!) 157/71  Pulse: 85 76  75  Resp: 20 15  19   Temp: 98.8 F (37.1 C) 99.2 F (37.3 C)  97.8 F (36.6 C)  TempSrc: Axillary Axillary  Oral  SpO2: 95% 95%  99%  Weight:   76.5 kg     Intake/Output Summary (Last 24 hours) at 05/24/2021 1014 Last data filed at 05/24/2021 0557 Gross  per 24 hour  Intake 1197 ml  Output 650 ml  Net 547 ml    Filed Weights   05/18/21 0500 05/22/21 0400 05/24/21 0500  Weight: 77.7 kg 79.1 kg 76.5 kg    Examination: General exam: Appears comfortable - swakens only briefly HEENT: PERRLA, oral mucosa moist, no sclera icterus or thrush Respiratory system: Clear to auscultation. Respiratory effort normal. Cardiovascular system: S1 & S2 heard, regular rate and rhythm Gastrointestinal system: Abdomen soft, non-tender, nondistended. Normal bowel sounds   Central nervous system: sleepy- left side flaccid- moves right arm and right leg independently but not on command- left facial droop, nods and give one word answers Extremities: No cyanosis, clubbing or edema Skin: No rashes or ulcers Psychiatry:  unable to assess    Data Reviewed: I have personally reviewed following labs and imaging studies  CBC: Recent Labs  Lab 05/19/21 0347 05/20/21 0532 05/21/21 0259 05/23/21 0717 05/24/21 0227  WBC 11.0* 12.8* 12.0* 14.3* 16.5*  HGB 14.1 14.8 13.8 12.9 12.9  HCT 43.6 44.5 42.3 40.0 39.8  MCV 97.5 97.8 97.0 97.6 99.0  PLT 201 201 202 246 245    Basic Metabolic Panel: Recent Labs  Lab 05/18/21 0444 05/19/21 0347 05/20/21 0532 05/21/21 0259 05/23/21 0717  NA 138 137 136 135 135  K 3.7 4.0 3.8 3.8 4.3  CL 100 99 98 97* 100  CO2 29 28 27 25 26   GLUCOSE 105* 148* 138* 158* 184*  BUN 25* 26* 24* 32* 28*  CREATININE 0.82 0.81 0.70 0.78 0.72  CALCIUM 8.4* 8.6* 8.4* 8.3* 8.6*  MG  --   --   --  2.6*  --   PHOS  --   --   --  4.2  --     GFR: CrCl cannot be calculated (Unknown ideal weight.). Liver Function Tests: No results for input(s): AST, ALT, ALKPHOS, BILITOT, PROT, ALBUMIN in the last 168 hours. No results for input(s): LIPASE, AMYLASE in the last 168 hours. No results for input(s): AMMONIA in the last 168 hours. Coagulation Profile: No results for input(s): INR, PROTIME in the last 168 hours. Cardiac Enzymes: No  results for input(s): CKTOTAL, CKMB, CKMBINDEX, TROPONINI in the last 168 hours. BNP (last 3 results) No results for input(s): PROBNP in the last 8760 hours. HbA1C: No results for input(s): HGBA1C in the last 72 hours. CBG: Recent Labs  Lab 05/23/21 1544 05/23/21 1951 05/23/21 2306 05/24/21 0412 05/24/21 0726  GLUCAP 168* 144* 163* 165* 184*    Lipid Profile: Recent Labs    05/23/21 0717  TRIG 91    Thyroid Function Tests: No results for input(s): TSH, T4TOTAL, FREET4, T3FREE, THYROIDAB in the last 72 hours. Anemia Panel: No results for input(s): VITAMINB12, FOLATE, FERRITIN, TIBC, IRON, RETICCTPCT in the last 72 hours. Urine analysis:    Component Value Date/Time  COLORURINE YELLOW 05/17/2021 1123   APPEARANCEUR CLEAR 05/17/2021 1123   LABSPEC 1.021 05/17/2021 1123   PHURINE 5.0 05/17/2021 1123   GLUCOSEU NEGATIVE 05/17/2021 1123   HGBUR NEGATIVE 05/17/2021 1123   BILIRUBINUR NEGATIVE 05/17/2021 1123   KETONESUR NEGATIVE 05/17/2021 1123   PROTEINUR NEGATIVE 05/17/2021 1123   NITRITE NEGATIVE 05/17/2021 1123   LEUKOCYTESUR NEGATIVE 05/17/2021 1123   Sepsis Labs: @LABRCNTIP (procalcitonin:4,lacticidven:4) ) No results found for this or any previous visit (from the past 240 hour(s)).        Radiology Studies: DG Chest Port 1 View  Result Date: 05/24/2021 CLINICAL DATA:  CVA.  Shortness of breath. EXAM: PORTABLE CHEST 1 VIEW COMPARISON:  05/17/2021 FINDINGS: Feeding tube extends beyond the inferior aspect of the film. Midline trachea. Moderate cardiomegaly. Mild right hemidiaphragm elevation. The left costophrenic angle is minimally excluded. No pleural effusion or pneumothorax. No congestive failure. No lobar consolidation. IMPRESSION: Cardiomegaly without congestive failure. Electronically Signed   By: 07/17/2021 M.D.   On: 05/24/2021 09:14      Scheduled Meds:  amLODipine  10 mg Per Tube Daily   chlorhexidine  15 mL Mouth Rinse BID   Chlorhexidine  Gluconate Cloth  6 each Topical Daily   feeding supplement (PROSource TF)  45 mL Per Tube BID   heparin  5,000 Units Subcutaneous Q8H   hydrALAZINE  10 mg Per Tube Q8H   insulin aspart  0-6 Units Subcutaneous Q4H   labetalol  200 mg Per Tube TID   lisinopril  40 mg Per Tube Daily   magic mouthwash  1 mL Oral TID   mouth rinse  15 mL Mouth Rinse q12n4p   senna-docusate  1 tablet Per Tube BID   Continuous Infusions:  feeding supplement (OSMOLITE 1.2 CAL) 1,000 mL (05/23/21 2332)     LOS: 10 days      07/23/21, MD [facility] Pager: [email or web address] 05/24/2021, 10:14 AM

## 2021-05-24 NOTE — Progress Notes (Signed)
STROKE TEAM PROGRESS NOTE   SUBJECTIVE (INTERVAL HISTORY)  No family at bedside, patient resting in bed. She is drowsy but arouses to voice and able to cooperate with the physical examination. She moves left side purposefully but remains plegic on the right.    BP elevated to 160-172 at times.  Will increase hydralazine to  q 8hrs.    OBJECTIVE Temp:  [97.8 F (36.6 C)-99.2 F (37.3 C)] 98.5 F (36.9 C) (06/12 1158) Pulse Rate:  [74-91] 74 (06/12 1158) Cardiac Rhythm: Normal sinus rhythm (06/12 0700) Resp:  [15-20] 20 (06/12 1158) BP: (118-172)/(71-85) 172/82 (06/12 1158) SpO2:  [93 %-99 %] 99 % (06/12 1158) Weight:  [76.5 kg] 76.5 kg (06/12 0500)  Recent Labs  Lab 05/23/21 1951 05/23/21 2306 05/24/21 0412 05/24/21 0726 05/24/21 1157  GLUCAP 144* 163* 165* 184* 129*   Recent Labs  Lab 05/19/21 0347 05/20/21 0532 05/21/21 0259 05/23/21 0717 05/24/21 1026  NA 137 136 135 135 132*  K 4.0 3.8 3.8 4.3 4.7  CL 99 98 97* 100 97*  CO2 GLUCOSE 148* 138* 158* 184* 164*  BUN 26* 24* 32* 28* 33*  CREATININE 0.81 0.70 0.78 0.72 0.66  CALCIUM 8.6* 8.4* 8.3* 8.6* 8.6*  MG  --   --  2.6*  --   --   PHOS  --   --  4.2  --   --    Recent Labs  Lab 05/24/21 1026  AST 32  ALT 33  ALKPHOS 62  BILITOT 0.6  PROT 6.8  ALBUMIN 3.3*    Recent Labs  Lab 05/19/21 0347 05/20/21 0532 05/21/21 0259 05/23/21 0717 05/24/21 0227  WBC 11.0* 12.8* 12.0* 14.3* 16.5*  HGB 14.1 14.8 13.8 12.9 12.9  HCT 43.6 44.5 42.3 40.0 39.8  MCV 97.5 97.8 97.0 97.6 99.0  PLT 201 201 202 246 245   No results for input(s): CKTOTAL, CKMB, CKMBINDEX, TROPONINI in the last 168 hours. No results for input(s): LABPROT, INR in the last 72 hours. Recent Labs    05/24/21 0805  COLORURINE YELLOW  LABSPEC 1.028  PHURINE 5.0  GLUCOSEU 50*  HGBUR NEGATIVE  BILIRUBINUR NEGATIVE  KETONESUR NEGATIVE  PROTEINUR NEGATIVE  NITRITE NEGATIVE  LEUKOCYTESUR NEGATIVE       Component  Value Date/Time   CHOL 203 (H) 05/14/2021 0022   TRIG 91 05/23/2021 0717   HDL 68 05/14/2021 0022   CHOLHDL 3.0 05/14/2021 0022   VLDL 14 05/14/2021 0022   LDLCALC 121 (H) 05/14/2021 0022   Lab Results  Component Value Date   HGBA1C 5.7 (H) 05/14/2021      Component Value Date/Time   LABOPIA NONE DETECTED 05/14/2021 0254   COCAINSCRNUR NONE DETECTED 05/14/2021 0254   LABBENZ NONE DETECTED 05/14/2021 0254   AMPHETMU NONE DETECTED 05/14/2021 0254   THCU NONE DETECTED 05/14/2021 0254   LABBARB NONE DETECTED 05/14/2021 0254    No results for input(s): ETH in the last 168 hours.  I have personally reviewed the radiological images below and agree with the radiology interpretations.  DG Abd 1 View  Result Date: 05/14/2021 CLINICAL DATA:  60 year old female with intracranial hemorrhage. EXAM: ABDOMEN - 1 VIEW COMPARISON:  Portable pelvis radiograph today reported separately. FINDINGS: Portable AP supine view at 0344 hours. Non obstructed bowel gas pattern. No definite pneumoperitoneum on this supine view. Minimally included left lung base appears grossly negative. Scoliosis and degenerative changes in the spine. Pelvic dystrophic and/or vascular calcifications, mild. No acute  osseous abnormality identified. IMPRESSION: 1. Normal bowel gas pattern. 2. Spinal scoliosis and degeneration. Electronically Signed   By: Odessa Fleming M.D.   On: 05/14/2021 06:19   CT HEAD WO CONTRAST  Result Date: 05/21/2021 CLINICAL DATA:  Stroke follow-up EXAM: CT HEAD WITHOUT CONTRAST TECHNIQUE: Contiguous axial images were obtained from the base of the skull through the vertex without intravenous contrast. COMPARISON:  Three days ago FINDINGS: Brain: Left thalamic hematoma is unchanged in size and shape, measuring up to 4.2 x 3.1 cm on axial slices. Intraventricular clot has decreased in thickness at the level of the lateral ventricles. No increased ventricular size. Chronic small vessel ischemia in the hemispheric white  matter. Negative for cortical infarct. 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 Stable positioning of EVD which traverses the frontal horn of the right lateral ventricle. Vascular: Negative Skull: Negative Sinuses/Orbits: Unremarkable IMPRESSION: 1. No recurrent hemorrhage.  Decreasing intraventricular clot. 2. Stable EVD positioning with unremarkable ventricular size. Electronically Signed   By: Marnee Spring M.D.   On: 05/21/2021 10:57   CT HEAD WO CONTRAST  Result Date: 05/18/2021 CLINICAL DATA:  60 year old female with left thalamic hemorrhage, IVH, EVD. EXAM: CT HEAD WITHOUT CONTRAST TECHNIQUE: Contiguous axial images were obtained from the base of the skull through the vertex without intravenous contrast. COMPARISON:  Head CT 05/15/2021 and earlier. FINDINGS: Brain: Right superior frontal approach EVD with catheter terminating at the 3rd ventricle and communicating with the right lateral ventricle on coronal image 35. The 3rd and 4th ventricles remain relatively decompressed. There is less 3rd and 4th ventricular IVH. Lateral ventricle size also mildly decreased since 05/15/2021, although with moderate residual lateral ventricular blood greater on the left. Oval left thalamic intra-axial hemorrhage encompasses 34 x 22 by 33 mm now (AP by transverse by CC) for an estimated blood volume of 12 mL, and has not changed since 05/15/2021. Regional edema and mild regional mass effect are stable. Stable gray-white matter differentiation elsewhere with Patchy and confluent white matter hypodensity. No new cortically based infarct identified. Basilar cisterns are patent. No significant midline shift. Vascular: Calcified atherosclerosis at the skull base. Skull: Stable right superior burr hole. Sinuses/Orbits: Visualized paranasal sinuses and mastoids are stable and well aerated. Other: Right nasoenteric tube in place. Mild postoperative changes to the scalp vertex. Disconjugate gaze but otherwise  negative orbits soft tissues. IMPRESSION: 1. Stable left thalamic intra-axial hemorrhage size since 05/15/2021. Stable regional edema and mild regional mass effect. 2. Stable right frontal approach EVD with slightly decreased lateral ventricle size since 05/15/2021. Moderate lateral ventricle IVH persists. There is less blood in the small 3rd and 4th ventricles now. 3. Underlying chronic small vessel disease. No new intracranial abnormality. Electronically Signed   By: Odessa Fleming M.D.   On: 05/18/2021 06:31   CT HEAD WO CONTRAST  Result Date: 05/15/2021 CLINICAL DATA:  Cerebral hemorrhage suspected. EXAM: CT HEAD WITHOUT CONTRAST TECHNIQUE: Contiguous axial images were obtained from the base of the skull through the vertex without intravenous contrast. COMPARISON:  MRI and CT of the brain May 14, 2021. FINDINGS: Brain: No significant interval change size of the left thalamic hemorrhage with hematoma now measuring approximately 3.5 x 3.3 x 2 cm compared to 3.3 x 3.3 x 2.2 cm on prior. Persistent hypodensity surrounding the hematoma related to vasogenic edema. Supratentorial and infratentorial intraventricular hemorrhage related to hematoma decompression into the right lateral ventricle, status post right frontal approach ventricular drain placement. Drain tip is at level of the  right foramen of Monro. There is mild interval decrease in size of the supratentorial ventricles, particularly the temporal horns. Patchy hypodensity of the white matter of the cerebral hemispheres, nonspecific most likely related to small vessel ischemia. Remote lacunar infarct in the right radiata. Vascular: No hyperdense vessel or unexpected calcification. Skull: Negative for fracture or focal lesion. Sinuses/Orbits: No acute finding. Other: None. IMPRESSION: 1. Interval placement of a right frontal approach ventricular drain with the tip at the level of the right foramen of Monro. Interval mild decrease in size of the ventricular system,  particularly the temporal horns of the lateral ventricles. 2. Stable appearance of left thalamic intraparenchymal hemorrhage with intraventricular extension. Electronically Signed   By: Baldemar Lenis M.D.   On: 05/15/2021 09:41   CT HEAD WO CONTRAST  Result Date: 05/14/2021 CLINICAL DATA:  Intraparenchymal hemorrhage.  Follow-up. EXAM: CT HEAD WITHOUT CONTRAST TECHNIQUE: Contiguous axial images were obtained from the base of the skull through the vertex without intravenous contrast. COMPARISON:  Same day CT head. FINDINGS: Mildly motion limited exam.  Within this limitation: Brain: When accounting for differences in technique, similar size of the acute intraparenchymal hemorrhage centered in the left thalamus which measures up to 3.3 x 2.2 x 3.3 cm (similar when remeasured on the prior). Similar regional mass effect. Similar intraventricular hemorrhage within the left greater than right lateral ventricles, third ventricle, and fourth ventricle. Similar ventriculomegaly with mild periventricular edema and prominence of the temporal horns. Additional scattered white matter hypoattenuation is nonspecific but most likely related to chronic microvascular ischemic disease. No evidence of acute large vascular territory infarct. Similar suspected prior infarct in the right corona radiata. Vascular: No hyperdense vessel identified. Calcific atherosclerosis. Skull: No acute fracture. Sinuses/Orbits: No acute findings. Other: No mastoid effusions. IMPRESSION: When accounting for differences in technique, similar size of the acute left thalamic intraparenchymal hemorrhage. Similar intraventricular extension of hemorrhage and ventriculomegaly. Electronically Signed   By: Feliberto Harts MD   On: 05/14/2021 06:21   CT HEAD WO CONTRAST  Result Date: 05/14/2021 CLINICAL DATA:  Intraparenchymal hemorrhage. Follow-up of scans performed at [place], [place] [facility]. EXAM: CT HEAD WITHOUT CONTRAST TECHNIQUE:  Contiguous axial images were obtained from the base of the skull through the vertex without intravenous contrast. COMPARISON:  None. I am not able to view the outside institution scans. FINDINGS: Brain: Intraparenchymal hematoma centered in the left thalamus measures 3.2 x 2.2 x 3.0 cm (volume = 11 cm^3). There is moderate hydrocephalus with periventricular hypoattenuation suggesting transependymal interstitial edema. There is rightward bulging of the septum pellucidum. Large amount of blood in the ventricles. Vascular: No abnormal hyperdensity of the major intracranial arteries or dural venous sinuses. No intracranial atherosclerosis. Skull: The visualized skull base, calvarium and extracranial soft tissues are normal. Sinuses/Orbits: No fluid levels or advanced mucosal thickening of the visualized paranasal sinuses. No mastoid or middle ear effusion. The orbits are normal. IMPRESSION: 1. Intraparenchymal hematoma centered in the left thalamus with intraventricular extension and moderate hydrocephalus with transependymal interstitial edema. Critical Value/emergent results were called by telephone at the time of interpretation on 05/14/2021 at 12:58 am to provider ERIC [facility] , who verbally acknowledged these results. Electronically Signed   By: Deatra Robinson M.D.   On: 05/14/2021 00:58   MR MRA HEAD WO CONTRAST  Result Date: 05/14/2021 CLINICAL DATA:  Intracranial hemorrhage EXAM: MRI HEAD WITHOUT CONTRAST MRA HEAD WITHOUT CONTRAST TECHNIQUE: Multiplanar, multi-echo pulse sequences of the brain and surrounding structures were acquired without intravenous  contrast. Angiographic images of the Circle of Willis were acquired using MRA technique without intravenous contrast. COMPARISON: No pertinent prior exam. COMPARISON:  Correlation made with prior head CT FINDINGS: MRI HEAD Brain: Left thalamic hemorrhage is again identified similar to the recent CT with acute blood products. There is mild surrounding edema.  Significant intraventricular extension is present as seen on CT with resulting hydrocephalus. Focus susceptibility in the right corona radiata reflects chronic microhemorrhage associated with a lacunar infarct. Additional patchy and confluent areas of T2 hyperintensity in the supratentorial white matter are nonspecific but probably reflect moderate chronic microvascular ischemic changes. No post contrast imaging performed therefore potential underlying lesion is not well evaluated. Vascular: Major vessel flow voids at the skull base are preserved. Skull and upper cervical spine: Normal marrow signal is preserved. Sinuses/Orbits: Paranasal sinuses are aerated. Orbits are unremarkable. Other: Sella is unremarkable.  Mastoid air cells are clear. MRA HEAD Intracranial internal carotid arteries are patent. Middle and anterior cerebral arteries are patent. Intracranial vertebral arteries, basilar artery, posterior cerebral arteries are patent. There is no significant stenosis or aneurysm. No abnormal flow related enhancement in the region of thalamic hemorrhage. IMPRESSION: No substantial change in left thalamic hemorrhage with significant intraventricular extension and hydrocephalus as well as mild edema and mass effect compared to recent CT. 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 Moderate chronic microvascular ischemic changes. Unremarkable MRA. Electronically Signed   By: Guadlupe Spanish M.D.   On: 05/14/2021 11:18   MR BRAIN WO CONTRAST  Result Date: 05/14/2021 CLINICAL DATA:  Intracranial hemorrhage EXAM: MRI HEAD WITHOUT CONTRAST MRA HEAD WITHOUT CONTRAST TECHNIQUE: Multiplanar, multi-echo pulse sequences of the brain and surrounding structures were acquired without intravenous contrast. Angiographic images of the Circle of Willis were acquired using MRA technique without intravenous contrast. COMPARISON: No pertinent prior exam. COMPARISON:  Correlation made with  prior head CT FINDINGS: MRI HEAD Brain: Left thalamic hemorrhage is again identified similar to the recent CT with acute blood products. There is mild surrounding edema. Significant intraventricular extension is present as seen on CT with resulting hydrocephalus. Focus susceptibility in the right corona radiata reflects chronic microhemorrhage associated with a lacunar infarct. Additional patchy and confluent areas of T2 hyperintensity in the supratentorial white matter are nonspecific but probably reflect moderate chronic microvascular ischemic changes. No post contrast imaging performed therefore potential underlying lesion is not well evaluated. Vascular: Major vessel flow voids at the skull base are preserved. Skull and upper cervical spine: Normal marrow signal is preserved. Sinuses/Orbits: Paranasal sinuses are aerated. Orbits are unremarkable. Other: Sella is unremarkable.  Mastoid air cells are clear. MRA HEAD Intracranial internal carotid arteries are patent. Middle and anterior cerebral arteries are patent. Intracranial vertebral arteries, basilar artery, posterior cerebral arteries are patent. There is no significant stenosis or aneurysm. No abnormal flow related enhancement in the region of thalamic hemorrhage. IMPRESSION: No substantial change in left thalamic hemorrhage with significant intraventricular extension and hydrocephalus as well as mild edema and mass effect compared to recent CT. 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 Moderate chronic microvascular ischemic changes. Unremarkable MRA. Electronically Signed   By: Guadlupe Spanish M.D.   On: 05/14/2021 11:18   DG Pelvis Portable  Result Date: 05/14/2021 CLINICAL DATA:  60 year old female with intracranial hemorrhage. EXAM: PORTABLE PELVIS 1-2 VIEWS COMPARISON:  None. FINDINGS: Portable AP view at 0346 hours. Mild likely fibroid associated dystrophic calcifications in the pelvis eccentric to  the  right. Femoral heads normally located. Pelvis and proximal femurs appear intact. No acute osseous abnormality identified. Negative visible bowel gas pattern. IMPRESSION: Negative. Electronically Signed   By: Odessa Fleming M.D.   On: 05/14/2021 06:18   DG Chest Port 1 View  Result Date: 05/24/2021 CLINICAL DATA:  CVA.  Shortness of breath. EXAM: PORTABLE CHEST 1 VIEW COMPARISON:  05/17/2021 FINDINGS: Feeding tube extends beyond the inferior aspect of the film. Midline trachea. Moderate cardiomegaly. Mild right hemidiaphragm elevation. The left costophrenic angle is minimally excluded. No pleural effusion or pneumothorax. No congestive failure. No lobar consolidation. IMPRESSION: Cardiomegaly without congestive failure. Electronically Signed   By: Jeronimo Greaves M.D.   On: 05/24/2021 09:14   DG CHEST PORT 1 VIEW  Result Date: 05/17/2021 CLINICAL DATA:  Respiratory failure EXAM: PORTABLE CHEST 1 VIEW COMPARISON:  May 14, 2021 FINDINGS: Feeding tube tip is below the diaphragm. No pneumothorax. There is no edema or airspace opacity. There is cardiomegaly with pulmonary vascularity normal. No adenopathy. No bone lesions. IMPRESSION: No edema or airspace opacity. Cardiomegaly, stable. Feeding tube tip below diaphragm. Electronically Signed   By: Bretta Bang III M.D.   On: 05/17/2021 14:17   DG CHEST PORT 1 VIEW  Result Date: 05/14/2021 CLINICAL DATA:  Provided history: Follow-up. EXAM: PORTABLE CHEST 1 VIEW COMPARISON:  None. FINDINGS: Cardiomediastinal silhouette is enlarged, likely accentuated by AP technique. Calcific atherosclerosis of the aorta. Low lung volumes. No consolidation. EKG leads bilaterally. No visible pleural effusions or pneumothorax on this single semi erect AP radiograph. IMPRESSION: 1. Low lung volumes without evidence of acute cardiopulmonary disease. 2. Cardiomegaly. Electronically Signed   By: Feliberto Harts MD   On: 05/14/2021 06:13   DG Abd Portable 1V  Result Date:  05/15/2021 CLINICAL DATA:  Feeding tube placement. EXAM: PORTABLE ABDOMEN - 1 VIEW COMPARISON:  None. FINDINGS: Feeding tube is in place with the tip in the distal stomach directed toward the duodenum. IMPRESSION: As above. Electronically Signed   By: Drusilla Kanner M.D.   On: 05/15/2021 11:40   ECHOCARDIOGRAM COMPLETE  Result Date: 05/16/2021    ECHOCARDIOGRAM REPORT   Patient Name:   PAISLEI DORVAL Date of Exam: 05/14/2021 Medical Rec #:  [id number]    Height: Accession #:    [id number]   Weight:       166.0 lb Date of Birth:  03-30-1961    BSA:          1.783 m Patient Age:    59 years     BP:           122/69 mmHg Patient Gender: F            HR:           77 bpm. Exam Location:  Inpatient Procedure: 2D Echo, Cardiac Doppler and Color Doppler Indications:    Stroke  History:        Patient has no prior history of Echocardiogram examinations.  Sonographer:    Ross Ludwig RDCS (AE) Referring Phys: [phone] ERIC LINDZEN  Sonographer Comments: Patient not responsive to request to sniff for IVC collapse test. IMPRESSIONS  1. Left ventricular ejection fraction, by estimation, is 50 to 55%. The left ventricle has low normal function. The left ventricle has no regional wall motion abnormalities. There is moderate thickening of the septum. The rest of the LV segments demonstrate mild left ventricular hypertrophy. Left ventricular diastolic parameters are consistent with Grade II diastolic dysfunction (pseudonormalization). Elevated left ventricular end-diastolic  pressure.  2. The average left ventricular global longitudinal strain is -6.7 %. The global longitudinal strain is abnormal. Strain pattern shows relative apical sparring which may be suggestive of amyloid in the right clinic context.  3. Right ventricular systolic function is normal. The right ventricular size is normal.  4. The mitral valve is normal in structure. Trivial mitral valve regurgitation. No evidence of mitral stenosis.  5. The aortic valve is tricuspid.  There is mild calcification of the aortic valve. There is mild thickening of the aortic valve. Aortic valve regurgitation is not visualized. Mild aortic valve sclerosis is present, with no evidence of aortic valve stenosis.  6. The inferior vena cava is dilated in size with <50% respiratory variability, suggesting right atrial pressure of 15 mmHg.  7. Left atrial size was mildly dilated. Comparison(s): No prior Echocardiogram. Conclusion(s)/Recommendation(s): No intracardiac source of embolism detected on this transthoracic study. A transesophageal echocardiogram is recommended to exclude cardiac source of embolism if clinically indicated. FINDINGS  Left Ventricle: Left ventricular ejection fraction, by estimation, is 50 to 55%. The left ventricle has low normal function. The left ventricle has no regional wall motion abnormalities. The average left ventricular global longitudinal strain is -6.7 %.  The global longitudinal strain is abnormal. The left ventricular internal cavity size was normal in size. There is moderate thickening of the septum. The rest of the LV segments demonstrate mild left ventricular hypertrophy. Left ventricular diastolic parameters are consistent with Grade II diastolic dysfunction (pseudonormalization). Elevated left ventricular end-diastolic pressure. Right Ventricle: The right ventricular size is normal. No increase in right ventricular wall thickness. Right ventricular systolic function is normal. Left Atrium: Left atrial size was mildly dilated. Right Atrium: Right atrial size was normal in size. Pericardium: There is no evidence of pericardial effusion. Mitral Valve: The mitral valve is normal in structure. There is mild thickening of the mitral valve leaflet(s). There is mild calcification of the mitral valve leaflet(s). Mild mitral annular calcification. Trivial mitral valve regurgitation. No evidence  of mitral valve stenosis. MV peak gradient, 7.1 mmHg. The mean mitral valve  gradient is 4.0 mmHg. Tricuspid Valve: The tricuspid valve is normal in structure. Tricuspid valve regurgitation is trivial. No evidence of tricuspid stenosis. Aortic Valve: The aortic valve is tricuspid. There is mild calcification of the aortic valve. There is mild thickening of the aortic valve. Aortic valve regurgitation is not visualized. Mild aortic valve sclerosis is present, with no evidence of aortic valve stenosis. Aortic valve mean gradient measures 8.0 mmHg. Aortic valve peak gradient measures 16.3 mmHg. Aortic valve area, by VTI measures 2.05 cm. Pulmonic Valve: The pulmonic valve was normal in structure. Pulmonic valve regurgitation is trivial. No evidence of pulmonic stenosis. Aorta: The aortic root and ascending aorta are structurally normal, with no evidence of dilitation. Venous: The inferior vena cava is dilated in size with less than 50% respiratory variability, suggesting right atrial pressure of 15 mmHg. IAS/Shunts: No atrial level shunt detected by color flow Doppler.  LEFT VENTRICLE PLAX 2D LVIDd:         4.70 cm  Diastology LVIDs:         4.20 cm  LV e' medial:    5.55 cm/s LV PW:         1.60 cm  LV E/e' medial:  23.2 LV IVS:        1.80 cm  LV e' lateral:   6.31 cm/s LVOT diam:     1.80 cm  LV E/e' lateral: 20.4  LV SV:         69 LV SV Index:   39       2D Longitudinal Strain LVOT Area:     2.54 cm 2D Strain GLS Avg:     -6.7 %  RIGHT VENTRICLE             IVC RV Basal diam:  3.50 cm     IVC diam: 2.30 cm RV S prime:     13.20 cm/s TAPSE (M-mode): 3.2 cm LEFT ATRIUM             Index       RIGHT ATRIUM           Index LA diam:        4.10 cm 2.30 cm/m  RA Area:     16.30 cm LA Vol (A2C):   42.1 ml 23.61 ml/m RA Volume:   45.00 ml  25.23 ml/m LA Vol (A4C):   43.6 ml 24.45 ml/m LA Biplane Vol: 47.3 ml 26.52 ml/m  AORTIC VALVE AV Area (Vmax):    1.86 cm AV Area (Vmean):   2.09 cm AV Area (VTI):     2.05 cm AV Vmax:           202.00 cm/s AV Vmean:          135.000 cm/s AV VTI:             0.336 m AV Peak Grad:      16.3 mmHg AV Mean Grad:      8.0 mmHg LVOT Vmax:         148.00 cm/s LVOT Vmean:        111.000 cm/s LVOT VTI:          0.271 m LVOT/AV VTI ratio: 0.81  AORTA Ao Root diam: 2.90 cm Ao Asc diam:  3.20 cm MITRAL VALVE MV Area (PHT): 3.08 cm     SHUNTS MV Area VTI:   1.83 cm     Systemic VTI:  0.27 m MV Peak grad:  7.1 mmHg     Systemic Diam: 1.80 cm MV Mean grad:  4.0 mmHg MV Vmax:       1.33 m/s MV Vmean:      90.6 cm/s MV Decel Time: 246 msec MV E velocity: 129.00 cm/s MV A velocity: 118.00 cm/s MV E/A ratio:  1.09 Laurance Flatten MD Electronically signed by Laurance Flatten MD Signature Date/Time: 05/16/2021/7:51:23 AM    Final    VAS US CAROTID  Result Date: 05/15/2021 Carotid Arterial Duplex Study Patient Name:  TRINISHA PAGET  Date of Exam:   05/14/2021 Medical Rec #: [id number]     Accession #:    [id number] Date of Birth: 1961-01-07     Patient Gender: F Patient Age:   3Y Exam Location:  [facility] Procedure:      VAS US CAROTID Referring Phys: [id number] Marvel Plan --------------------------------------------------------------------------------  Indications:       Hypertensive emergency (122/128). 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 Risk Factors:      Hypertension. Comparison Study:  No prior study on file Performing Technologist: Sherren Kerns RVS  Examination Guidelines: A complete evaluation includes B-mode imaging, spectral Doppler, color Doppler, and power Doppler as needed of all accessible portions of each vessel. Bilateral testing is considered an integral part of a complete examination. Limited examinations for reoccurring  indications may be performed as noted.  Right Carotid Findings: +----------+--------+--------+--------+------------------+--------+           PSV cm/sEDV cm/sStenosisPlaque DescriptionComments +----------+--------+--------+--------+------------------+--------+ CCA Prox  96      9                                           +----------+--------+--------+--------+------------------+--------+ CCA Distal104     13                                         +----------+--------+--------+--------+------------------+--------+ ICA Prox  66      15      1-39%   heterogenous               +----------+--------+--------+--------+------------------+--------+ ICA Distal54      13                                         +----------+--------+--------+--------+------------------+--------+ ECA       151     22                                         +----------+--------+--------+--------+------------------+--------+ +----------+--------+-------+--------+-------------------+           PSV cm/sEDV cmsDescribeArm Pressure (mmHG) +----------+--------+-------+--------+-------------------+ Subclavian150                                        +----------+--------+-------+--------+-------------------+ +---------+--------+--+--------+--+---------+ VertebralPSV cm/s41EDV cm/s12Antegrade +---------+--------+--+--------+--+---------+  Left Carotid Findings: +----------+--------+--------+--------+------------------+------------------+           PSV cm/sEDV cm/sStenosisPlaque DescriptionComments           +----------+--------+--------+--------+------------------+------------------+ CCA Prox  180     15                                                   +----------+--------+--------+--------+------------------+------------------+ CCA Distal98      13                                intimal thickening +----------+--------+--------+--------+------------------+------------------+ ICA Prox  70      22      1-39%                     intimal thickening +----------+--------+--------+--------+------------------+------------------+ ICA Distal68      22                                                    +----------+--------+--------+--------+------------------+------------------+ ECA       91      18                                                   +----------+--------+--------+--------+------------------+------------------+ +----------+--------+--------+--------+-------------------+  PSV cm/sEDV cm/sDescribeArm Pressure (mmHG) +----------+--------+--------+--------+-------------------+ Subclavian87                                          +----------+--------+--------+--------+-------------------+ +---------+--------+--+--------+--+---------+ VertebralPSV cm/s61EDV cm/s13Antegrade +---------+--------+--+--------+--+---------+   Summary: Right Carotid: Velocities in the right ICA are consistent with a 1-39% stenosis. Left Carotid: Velocities in the left ICA are consistent with a 1-39% stenosis. Vertebrals: Bilateral vertebral arteries demonstrate antegrade flow. *See table(s) above for measurements and observations.  Electronically signed by Marvel Plan MD on 05/15/2021 at 2:54:40 PM.    Final       PHYSICAL EXAM Middle-aged African-American lady not in distress Temp:  [97.8 F (36.6 C)-99.2 F (37.3 C)] 98.5 F (36.9 C) (06/12 1158) Pulse Rate:  [74-91] 74 (06/12 1158) Resp:  [15-20] 20 (06/12 1158) BP: (118-172)/(71-85) 172/82 (06/12 1158) SpO2:  [93 %-99 %] 99 % (06/12 1158) Weight:  [76.5 kg] 76.5 kg (06/12 0500) Cardiovascular - Regular rhythm and rate. Neurological Exam  -drowsy but can arouse partially with stimulation but continues to have expressive greater than receptive aphasia and speaks only occasional words.  Cannot speak sentences. Follows simple commands such as sticking out tongue and lifting left hand. Left gaze preference barely cross midline, not blinking to visual threat consistently, PERRL. Right facial droop. Tongue protrusion not cooperative. Spontaneously moving LUE against gravity, no drift, RUE withdraw 2/5.  LLE 3/5, withdraw 2/5 at RLE.  Sensation, coordination not cooperative and gait not tested.  ASSESSMENT/PLAN Marissa Morris is a 60 y.o. female with no significant history admitted for N/V, right sided weakness and facial droop. No tPA given due to ICH.    ICH with IVH:  Left thalamic ICH with extensive IVH -  secondary to hypertensive emergency CT head left thalamic ICH with IVH Repeat CT head stable ICH and ventricles MRI  Stable ICH and ventricles MRA  Unremarkable.  Carotid Doppler  <50%  stenosis bilaterally 2D Echo  Normal EF 50-55%, left ventricular global longitudinal strain is  apical sparring which may be suggestive of amyloid in the right clinic context.   LDL 121 HgbA1c 5.7 VTE prophylaxis; SCDs, Heparin SQ No antithrombotic prior to admission, now on No antithrombotic due to ICH On keppra 500mg  bid for seizure prophylaxis  Ongoing aggressive stroke risk factor management Therapy recommendations:  CIR Disposition:  Pending  Stroke team will signoff.  Please call with any further questions or concerns.   Hydrocephalus  Right frontal EVD removed 6/10 Repeat CT 6/6 mildly decreased ventricle size Close monitoring  Hypertensive emergency  Amlodipine 10mg  daily, Hydralazine 10mg  q 8 Lisinopril 40 mg daily  Carvedilol 12.5 bid was discontinued on 6/9 and she was started on Labetalol 100 TID for better blood pressure control. Increased to 200 TID on 05/22/21 as blood pressure continue to be elevated in the 140-170 range.  Increase hydralazine to 20mg  q 8hr On labetalol PRN Maintain SBP <160 Long term BP goal normotensive  Hyperlipidemia Home meds:  none  LDL 121, goal < 70 Consider statin at discharge  Dysphagia  Did not pass swallow NPO now Cortrak with tube feedings   Other Active Problems Leukocytosis  WBC 14.3->16.5 Tmax 99.2 Urinalysis (6/12): negative CXR (6/12): no infiltrates  Blood cultures (6/12): pending ICU fever work up: vaginal swab/culture was sent to lab on 6/7 resulted  as negative. She received Ceftriaxone x1, was briefly placed on doxycycline and  metronidazole empirically Medical team on board  Lissy Olivencia-Simmons, ACNP-BC Stroke NP   To contact Stroke Continuity provider, please refer to [email or web address]. After hours, contact General Neurology

## 2021-05-25 ENCOUNTER — Inpatient Hospital Stay (HOSPITAL_COMMUNITY): Payer: BLUE CROSS/BLUE SHIELD

## 2021-05-25 DIAGNOSIS — J9601 Acute respiratory failure with hypoxia: Secondary | ICD-10-CM

## 2021-05-25 LAB — CBC
HCT: 41.3 % (ref 36.0–46.0)
Hemoglobin: 13.6 g/dL (ref 12.0–15.0)
MCH: 31.9 pg (ref 26.0–34.0)
MCHC: 32.9 g/dL (ref 30.0–36.0)
MCV: 96.7 fL (ref 80.0–100.0)
Platelets: 263 K/uL (ref 150–400)
RBC: 4.27 MIL/uL (ref 3.87–5.11)
RDW: 12.7 % (ref 11.5–15.5)
WBC: 17.4 K/uL — ABNORMAL HIGH (ref 4.0–10.5)
nRBC: 0 % (ref 0.0–0.2)

## 2021-05-25 LAB — GLUCOSE, CAPILLARY
Glucose-Capillary: 145 mg/dL — ABNORMAL HIGH (ref 70–99)
Glucose-Capillary: 146 mg/dL — ABNORMAL HIGH (ref 70–99)
Glucose-Capillary: 161 mg/dL — ABNORMAL HIGH (ref 70–99)
Glucose-Capillary: 165 mg/dL — ABNORMAL HIGH (ref 70–99)
Glucose-Capillary: 174 mg/dL — ABNORMAL HIGH (ref 70–99)
Glucose-Capillary: 175 mg/dL — ABNORMAL HIGH (ref 70–99)

## 2021-05-25 LAB — BASIC METABOLIC PANEL WITH GFR
Anion gap: 8 (ref 5–15)
BUN: 26 mg/dL — ABNORMAL HIGH (ref 6–20)
CO2: 27 mmol/L (ref 22–32)
Calcium: 8.6 mg/dL — ABNORMAL LOW (ref 8.9–10.3)
Chloride: 96 mmol/L — ABNORMAL LOW (ref 98–111)
Creatinine, Ser: 0.58 mg/dL (ref 0.44–1.00)
GFR, Estimated: >60 mL/min
Glucose, Bld: 167 mg/dL — ABNORMAL HIGH (ref 70–99)
Potassium: 4.5 mmol/L (ref 3.5–5.1)
Sodium: 131 mmol/L — ABNORMAL LOW (ref 135–145)

## 2021-05-25 IMAGING — CR DG ABDOMEN 1V
1 series · 1 of 1 positions shown · non-contrast
Comparison: [DATE]

CLINICAL DATA: Diarrhea

EXAM:
ABDOMEN - 1 VIEW

[abdomen kub]
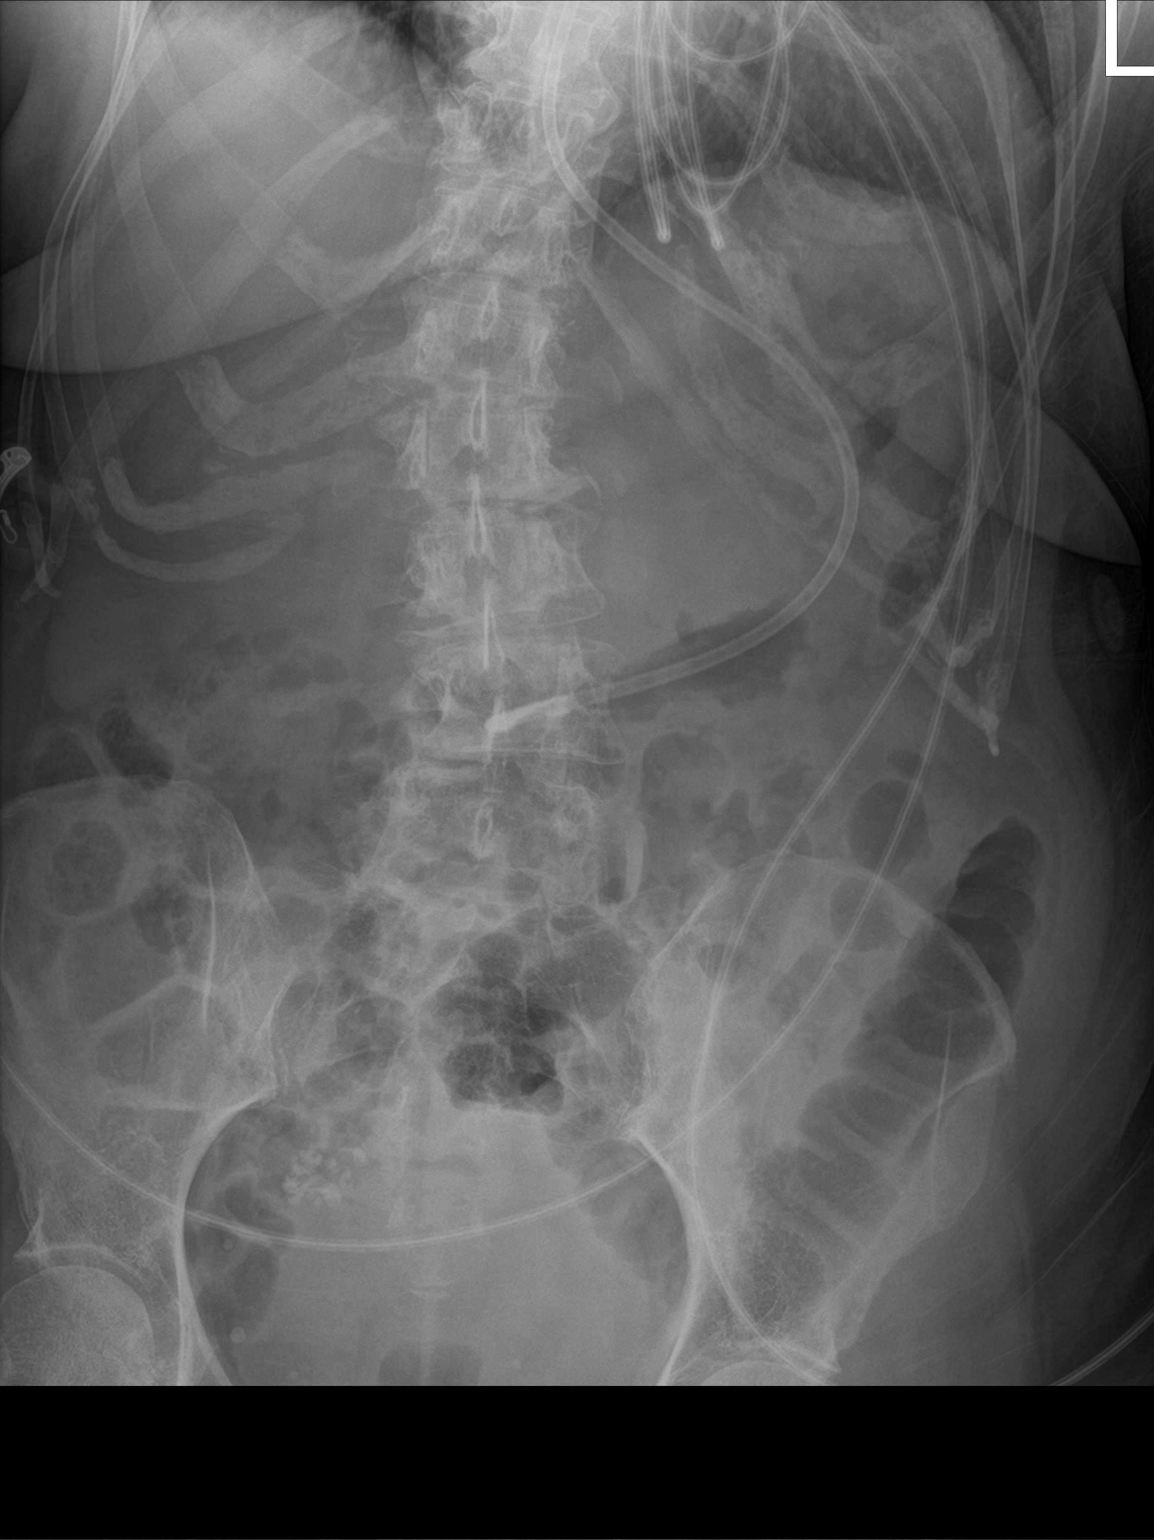

[1 of 1 positions shown; findings below may reference images not displayed]

FINDINGS: Feeding tube tip in distal stomach. Mild stool volume in the colon.
There is no bowel dilatation or air-fluid level to suggest bowel
obstruction. No free air. Focus of calcification in the right pelvis
measuring 2 x 2 cm, a likely calcified uterine leiomyoma.
IMPRESSION: Feeding tube tip in distal stomach. No bowel obstruction or free
air. Mild stool volume in colon. Probable calcified uterine
leiomyoma in right pelvis.

## 2021-05-25 MED ORDER — MODAFINIL 100 MG PO TABS
100.0000 mg | ORAL_TABLET | Freq: Every day | ORAL | Status: DC
  Filled 2021-05-25: qty 1

## 2021-05-25 MED ORDER — MODAFINIL 100 MG PO TABS
100.0000 mg | ORAL_TABLET | Freq: Every day | ORAL | Status: DC
  Administered 2021-05-25 – 2021-05-27 (×3): 100 mg
  Filled 2021-05-25 (×2): qty 1

## 2021-05-25 NOTE — Progress Notes (Addendum)
Physical Therapy Treatment Patient Details Name: Marissa Morris MRN: [id number] DOB: 1961-05-17 Today's Date: 05/25/2021    History of Present Illness Marissa Morris is a 60 y.o. female presenting to [facility] in transfer from [facility] for management of acute intracranial hemorrhage. 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 6/3 interval placement of R frontal EVD; removed 6/10. No significant PMH.    PT Comments    Pt lethargic today which limited session. Opened eyes to name but minimal participation with mobility. Max +2 to come to sit EOB. Attempted sit>stand 3x with stedy but pt not initiating any push from bed. Worked on [employer] each side in sitting with return to midline. Pushes R with LUE at times when LUE on bed away from her. Decreasing PT frequency to 3x/wk due to continued lethargy and inability to participate. Also changing d/c rec to SNF as pt not currently able to tolerate CIR level  therapies. PT will continue to follow.   Follow Up Recommendations  SNF     Equipment Recommendations  3in1 (PT);Wheelchair (measurements PT);Wheelchair cushion (measurements PT)    Recommendations for Other Services       Precautions / Restrictions Precautions Precautions: Fall;Other (comment) Precaution Comments: coretrack Restrictions Weight Bearing Restrictions: No    Mobility  Bed Mobility Overal bed mobility: Needs Assistance Bed Mobility: Supine to Sit;Sit to Supine Rolling: Mod assist   Supine to sit: +2 for physical assistance;Max assist Sit to supine: +2 for physical assistance;Max assist   General bed mobility comments: pt assisting once LE's off bed and SL to sit initiated by therapist, pt grasped rail with L hand and pulled up. No assist from pt for return to supine    Transfers Overall transfer level: Needs assistance Equipment used: Ambulation equipment used             General transfer comment: attempted  sit>stand 3x with stedy but pt not initiating and became unsafe sitting EOB  Ambulation/Gait                 Stairs             Wheelchair Mobility    Modified Rankin (Stroke Patients Only) Modified Rankin (Stroke Patients Only) Pre-Morbid Rankin Score: No symptoms Modified Rankin: Severe disability     Balance Overall balance assessment: Needs assistance Sitting-balance support: Feet supported;Single extremity supported Sitting balance-Leahy Scale: Poor Sitting balance - Comments: pt pushing with LUE when allowed to put it on bed beside her. Better when in lap. Worked on [employer] to R and L elbow with return to sitting. Worked on upright posture and stretching of neck musculature Postural control: Posterior lean;Right lateral lean     Standing balance comment: unable to stand today                            Cognition Arousal/Alertness: Lethargic Behavior During Therapy: Flat affect Overall Cognitive Status: Impaired/Different from baseline Area of Impairment: Following commands;Awareness;Problem solving;Attention                   Current Attention Level: Focused   Following Commands: Follows one step commands inconsistently   Awareness: Intellectual Problem Solving: Slow processing;Decreased initiation;Difficulty sequencing;Requires verbal cues;Requires tactile cues General Comments: following only 1-2 commands today      Exercises      General Comments General comments (skin integrity, edema, etc.): PRAFO returned to R foot after session and R hand  positioned on pillow      Pertinent Vitals/Pain Pain Assessment: Faces Faces Pain Scale: Hurts little more Pain Location: generalized with rolling Pain Descriptors / Indicators: Discomfort;Grimacing;Moaning Pain Intervention(s): Limited activity within patient's tolerance;Monitored during session    Home Living                      Prior Function            PT Goals  (current goals can now be found in the care plan section) Acute Rehab PT Goals Patient Stated Goal: did not state PT Goal Formulation: With patient/family Time For Goal Achievement: 05/30/21 Potential to Achieve Goals: Good Progress towards PT goals: Not progressing toward goals - comment (lethargy)    Frequency    Min 3x/week      PT Plan Updated discharge plan Updated treatment frequency    Co-evaluation              AM-PAC PT "6 Clicks" Mobility   Outcome Measure  Help needed turning from your back to your side while in a flat bed without using bedrails?: A Little Help needed moving from lying on your back to sitting on the side of a flat bed without using bedrails?: A Lot Help needed moving to and from a bed to a chair (including a wheelchair)?: Total Help needed standing up from a chair using your arms (e.g., wheelchair or bedside chair)?: Total Help needed to walk in hospital room?: Total Help needed climbing 3-5 steps with a railing? : Total 6 Click Score: 9    End of Session Equipment Utilized During Treatment: Gait belt Activity Tolerance: Patient limited by lethargy Patient left: with call bell/phone within reach;in bed;with bed alarm set Nurse Communication: Mobility status;Need for lift equipment (maximove today) PT Visit Diagnosis: Unsteadiness on feet (R26.81);Hemiplegia and hemiparesis;Muscle weakness (generalized) (M62.81);Difficulty in walking, not elsewhere classified (R26.2);Other symptoms and signs involving the nervous system (R29.898) Hemiplegia - Right/Left: Right Hemiplegia - dominant/non-dominant: Dominant Hemiplegia - caused by: Other Nontraumatic intracranial hemorrhage     Time: 1333-1405 PT Time Calculation (min) (ACUTE ONLY): 32 min  Charges:  $Therapeutic Activity: 23-37 mins                     Lyanne Co, PT  Acute Rehab Services  Pager [phone] Office [phone]    Marissa Morris Marissa Morris 05/25/2021, 4:19 PM

## 2021-05-25 NOTE — Progress Notes (Signed)
PROGRESS NOTE    Dondra Sorenson   ZOX:[id number]  DOB: 14-Jul-1961  DOA: 05/13/2021 PCP: Pcp, No   Brief Narrative:  Somaya Kochanski this is a 60 year old female with hypertension who presented to the hospital for right-sided weakness, vomiting and a headache. In the ED: BP 222/128, CT of the head showed a right thalamic hemorrhage with intraventricular extension and mild hydrocephalus On 6/3 she underwent placement of a right frontal EVD for hydrocephalus which was removed on 6/10    Subjective: Still quite sleepy today.     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 thought to be secondary to hypertensive emergency -With right-sided hemiplegia receptive aphasia, left gaze preference and right facial droop - Continues to be drowsy on exam-according to prior notes, she has been minimally  Verbal- she give an occasional one word answer to me-follows very limited commands and falls asleep quickly -A1c 5.7 -LDL 121 -Carotid duplex did not show any significant stenosis - last CT imaging on 6/9 reveals >"Decreasing intraventricular clot" -On Keppra twice daily for seizure prophylaxis -Cleviprex stopped 6/8- d/c'd HCTZ 6/11 which is a diuretic - control fluid balance with tube feeds - currently on hydralazine, amlodipine, labetalol, Lisinopril and as needed IV Hydralazine - Stroke team is following   Active Problems:   Encephalopathy due to hemorrhagic CVA vs underlying infection  - sleepy and minimally verbal - see infection w/u below - start Modafanil 100 mg daily due to ongoing lethargy- discussed with Dr Roda ShuttersXu- if not effective, after 3 days-4 days, can increase to 200 mg daily  Nutrition -has a core track and is on Osmolite 60 cc an hour - will see if she awakens enough to participate with SLP over the next few days  Fever and leukocytosis - Apparently she had some vaginal discharge which was sent to the lab-it was negative  for chlamydia, gonorrhea, trichomonas, candida and Gardnerella -No other source for fever was found - received about 3 days of doxycycline and 4 days of Flagyl-  stopped Flagyl 6/11 - last temp was 101.3 on 6/9 - WBC has risen to 17 today-  UA not consistent with UTI, CXR negative , LFTs normal - blood cultures drawn on 6/12 negative so far - no vaginal discharge noted at this time  Chronic grade 2 diastolic heart failure - Continue to follow fluid status  Hyperlipidemia - Hold off on statin for now  Glucose intolerance -A1c 5.7 - Continue checking sugars   Vaginal discharge -Resolved per nurse  Time spent in minutes: 40 DVT prophylaxis: heparin injection 5,000 Units Start: 05/18/21 1400 SCDs Start: 05/18/21 1232 Code Status: Full code Family Communication:  Level of Care: Level of care: Progressive Disposition Plan:  Status is: Inpatient  Remains inpatient appropriate because:Inpatient level of care appropriate due to severity of illness  Dispo: The patient is from: Home              Anticipated d/c is to: CIR              Patient currently is not medically stable to d/c.   Difficult to place patient No  Consultants:  Neurology Neurosurgery Pulmonary critical care Procedures:  EVD NG tube Antimicrobials:  Anti-infectives (From admission, onward)    Start     Dose/Rate Route Frequency Ordered Stop   05/19/21 1430  metroNIDAZOLE (FLAGYL) 50 mg/ml oral suspension 500 mg  Status:  Discontinued        500  mg Per Tube 2 times daily 05/19/21 1336 05/23/21 0804   05/19/21 1430  cefTRIAXone (ROCEPHIN) injection 500 mg        500 mg Intramuscular  Once 05/19/21 1336 05/19/21 1454   05/19/21 1430  doxycycline (VIBRA-TABS) tablet 100 mg  Status:  Discontinued        100 mg Per Tube Every 12 hours 05/19/21 1336 05/21/21 1038        Objective: Vitals:   05/25/21 0346 05/25/21 0600 05/25/21 0755 05/25/21 1200  BP:  (!) 176/92 (!) 163/84 131/78  Pulse:   75 79  Resp:    17 18  Temp:   99.5 F (37.5 C)   TempSrc:   Oral   SpO2: 100%  96% 95%  Weight:        Intake/Output Summary (Last 24 hours) at 05/25/2021 1256 Last data filed at 05/25/2021 6283 Gross per 24 hour  Intake 1952 ml  Output 1200 ml  Net 752 ml    Filed Weights   05/18/21 0500 05/22/21 0400 05/24/21 0500  Weight: 77.7 kg 79.1 kg 76.5 kg    Examination: General exam: Appears comfortable-  very sleepy- tries to respond to question with nodding and shaking head HEENT: PERRLA, oral mucosa moist, no sclera icterus or thrush- cor trac present Respiratory system: Clear to auscultation. Respiratory effort normal. Cardiovascular system: S1 & S2 heard, regular rate and rhythm Gastrointestinal system: Abdomen soft, non-tender, nondistended. Normal bowel sounds   Central nervous system: sleepy but awakens easily- left hemiparesis Extremities: No cyanosis, clubbing or edema Skin: No rashes or ulcers      Data Reviewed: I have personally reviewed following labs and imaging studies  CBC: Recent Labs  Lab 05/20/21 0532 05/21/21 0259 05/23/21 0717 05/24/21 0227 05/25/21 0350  WBC 12.8* 12.0* 14.3* 16.5* 17.4*  HGB 14.8 13.8 12.9 12.9 13.6  HCT 44.5 42.3 40.0 39.8 41.3  MCV 97.8 97.0 97.6 99.0 96.7  PLT 201 202 246 245 263    Basic Metabolic Panel: Recent Labs  Lab 05/20/21 0532 05/21/21 0259 05/23/21 0717 05/24/21 1026 05/25/21 0350  NA 136 135 135 132* 131*  K 3.8 3.8 4.3 4.7 4.5  CL 98 97* 100 97* 96*  CO2 27 25 26 24 27   GLUCOSE 138* 158* 184* 164* 167*  BUN 24* 32* 28* 33* 26*  CREATININE 0.70 0.78 0.72 0.66 0.58  CALCIUM 8.4* 8.3* 8.6* 8.6* 8.6*  MG  --  2.6*  --   --   --   PHOS  --  4.2  --   --   --     GFR: CrCl cannot be calculated (Unknown ideal weight.). Liver Function Tests: Recent Labs  Lab 05/24/21 1026  AST 32  ALT 33  ALKPHOS 62  BILITOT 0.6  PROT 6.8  ALBUMIN 3.3*   No results for input(s): LIPASE, AMYLASE in the last 168 hours. No  results for input(s): AMMONIA in the last 168 hours. Coagulation Profile: No results for input(s): INR, PROTIME in the last 168 hours. Cardiac Enzymes: No results for input(s): CKTOTAL, CKMB, CKMBINDEX, TROPONINI in the last 168 hours. BNP (last 3 results) No results for input(s): PROBNP in the last 8760 hours. HbA1C: No results for input(s): HGBA1C in the last 72 hours. CBG: Recent Labs  Lab 05/24/21 1939 05/24/21 2312 05/25/21 0330 05/25/21 0727 05/25/21 1137  GLUCAP 156* 150* 165* 175* 145*    Lipid Profile: Recent Labs    05/23/21 0717  TRIG 91  Thyroid Function Tests: No results for input(s): TSH, T4TOTAL, FREET4, T3FREE, THYROIDAB in the last 72 hours. Anemia Panel: No results for input(s): VITAMINB12, FOLATE, FERRITIN, TIBC, IRON, RETICCTPCT in the last 72 hours. Urine analysis:    Component Value Date/Time   COLORURINE YELLOW 05/24/2021 0805   APPEARANCEUR CLEAR 05/24/2021 0805   LABSPEC 1.028 05/24/2021 0805   PHURINE 5.0 05/24/2021 0805   GLUCOSEU 50 (A) 05/24/2021 0805   HGBUR NEGATIVE 05/24/2021 0805   BILIRUBINUR NEGATIVE 05/24/2021 0805   KETONESUR NEGATIVE 05/24/2021 0805   PROTEINUR NEGATIVE 05/24/2021 0805   NITRITE NEGATIVE 05/24/2021 0805   LEUKOCYTESUR NEGATIVE 05/24/2021 0805   Sepsis Labs: @LABRCNTIP (procalcitonin:4,lacticidven:4) ) Recent Results (from the past 240 hour(s))  Culture, blood (Routine X 2) w Reflex to ID Panel     Status: None (Preliminary result)   Collection Time: 05/24/21  8:56 AM   Specimen: BLOOD  Result Value Ref Range Status   Specimen Description BLOOD RIGHT ANTECUBITAL  Final   Special Requests   Final    BOTTLES DRAWN AEROBIC AND ANAEROBIC Blood Culture results may not be optimal due to an inadequate volume of blood received in culture bottles   Culture   Final    NO GROWTH < 24 HOURS Performed at [facility], [address], [place], [place]    Report Status PENDING  Incomplete  Culture,  blood (Routine X 2) w Reflex to ID Panel     Status: None (Preliminary result)   Collection Time: 05/24/21  8:56 AM   Specimen: BLOOD RIGHT HAND  Result Value Ref Range Status   Specimen Description BLOOD RIGHT HAND  Final   Special Requests   Final    BOTTLES DRAWN AEROBIC ONLY Blood Culture results may not be optimal due to an inadequate volume of blood received in culture bottles   Culture   Final    NO GROWTH < 24 HOURS Performed at [facility], [address] [place], [place]    Report Status PENDING  Incomplete          Radiology Studies: DG Chest Port 1 View  Result Date: 05/24/2021 CLINICAL DATA:  CVA.  Shortness of breath. EXAM: PORTABLE CHEST 1 VIEW COMPARISON:  05/17/2021 FINDINGS: Feeding tube extends beyond the inferior aspect of the film. Midline trachea. Moderate cardiomegaly. Mild right hemidiaphragm elevation. The left costophrenic angle is minimally excluded. No pleural effusion or pneumothorax. No congestive failure. No lobar consolidation. IMPRESSION: Cardiomegaly without congestive failure. Electronically Signed   By: 07/17/2021 M.D.   On: 05/24/2021 09:14      Scheduled Meds:  amLODipine  10 mg Per Tube Daily   chlorhexidine  15 mL Mouth Rinse BID   Chlorhexidine Gluconate Cloth  6 each Topical Daily   feeding supplement (PROSource TF)  45 mL Per Tube BID   heparin  5,000 Units Subcutaneous Q8H   hydrALAZINE  20 mg Per Tube Q8H   insulin aspart  0-6 Units Subcutaneous Q4H   labetalol  200 mg Per Tube TID   lisinopril  40 mg Per Tube Daily   magic mouthwash  1 mL Oral TID   mouth rinse  15 mL Mouth Rinse q12n4p   modafinil  100 mg Per Tube Daily   senna-docusate  1 tablet Per Tube BID   Continuous Infusions:  feeding supplement (OSMOLITE 1.2 CAL) 60 mL/hr at 05/24/21 1950     LOS: 11 days      Crystall Donaldson,  MD [facility] Pager: [email or web address] 05/25/2021, 12:56 PM

## 2021-05-25 NOTE — Progress Notes (Signed)
Inpatient Rehab Admissions Coordinator:   Pt. Remains somnolent, minimally participating with therapy this morning. She is not appropriate for CIR admission at this time, but may become an appropriate candidate if mentation/participation improves. I will follow and monitor for progress and participation.  Megan Salon, MS, CCC-SLP Rehab Admissions Coordinator  [phone] (celll) [phone] (office)

## 2021-05-25 NOTE — Progress Notes (Addendum)
Occupational Therapy Treatment Patient Details Name: Marissa Morris MRN: [id number] DOB: 1961/08/09 Today's Date: 05/25/2021    History of present illness Marissa Morris is a 60 y.o. female presenting to [facility] in transfer from [facility] for management of acute intracranial hemorrhage. 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 6/3 interval placement of R frontal EVD; removed 6/10. No significant PMH.   OT comments  Session limited by level of arousal - messaged MD. Per nsg, pt is sleeping well at night. Participated briefly in bed level ADL with Max A. Mobility significantly limited by somnolence, however pt did demonstrate increased awareness by moving/flexing LLE in assistance to scoot up in bed. Perseveration noted during ADL tasks. Abnormal tone RUE with greater spasticity proximally. Will monitor for need for resting hand splint. Will order Prevalon boot for R LE. If level of arousal improves feel pt will be a good candidate for CIR. Will continue to follow acutely  Follow Up Recommendations  CIR    Equipment Recommendations  3 in 1 bedside commode    Recommendations for Other Services Rehab consult    Precautions / Restrictions Precautions Precautions: Fall;Other (comment) Precaution Comments: coretrack Restrictions Weight Bearing Restrictions: No       Mobility Bed Mobility               General bed mobility comments: attempted however unable to susttain attention to activity due to level of arousal    Transfers                 General transfer comment: not attempted due to lethargy    Balance                                           ADL either performed or assessed with clinical judgement   ADL       Grooming: Maximal assistance Grooming Details (indicate cue type and reason): briefly washed eyes; attempting hand over hand with brushing teeth with toothette - pt jerked toothbrush out of  mouth                               General ADL Comments: Briefly wiped eyes; participation significantly affected by level of arousal     Vision   Additional Comments: overshooting when reaching for washcloth. Will furtehr assess; Appears to prefer L gaze   Perception     Praxis      Cognition Arousal/Alertness: Lethargic Behavior During Therapy: Flat affect Overall Cognitive Status: Impaired/Different from baseline Area of Impairment: Following commands;Awareness;Problem solving;Attention                   Current Attention Level: Focused   Following Commands: Follows one step commands inconsistently   Awareness: Intellectual Problem Solving: Slow processing;Decreased initiation;Difficulty sequencing;Requires verbal cues;Requires tactile cues General Comments: perseveration noted        Exercises     Shoulder Instructions       General Comments no active movement noted; significant increase in abnormal tone; tone greater distally; R footdrop    Pertinent Vitals/ Pain       Pain Assessment: Faces Faces Pain Scale: No hurt  Home Living  Prior Functioning/Environment              Frequency  Min 2X/week        Progress Toward Goals  OT Goals(current goals can now be found in the care plan section)  Progress towards OT goals: Not progressing toward goals - comment (progression  limited by lethargy this date)  Acute Rehab OT Goals Patient Stated Goal: did not state OT Goal Formulation: Patient unable to participate in goal setting Time For Goal Achievement: 05/30/21 Potential to Achieve Goals: Fair ADL Goals Pt Will Perform Grooming: sitting;with min guard assist Pt Will Perform Lower Body Dressing: with mod assist;sitting/lateral leans;sit to/from stand Pt Will Transfer to Toilet: with min assist;with +2 assist;stand pivot transfer;bedside commode Pt/caregiver will  Perform Home Exercise Program: Increased strength;Right Upper extremity;Increased ROM;With minimal assist;With written HEP provided Additional ADL Goal #1: Pt will follow 1-2 step commands with minimal cues for arousal in 3/5 trials in non distracting environment. Additional ADL Goal #2: Pt will utilize 3 ADL/iADL items properly with minimal cues to stay on task in 2/3 trials.  Plan Discharge plan remains appropriate (pending level of arousal)    Co-evaluation                 AM-PAC OT "6 Clicks" Daily Activity     Outcome Measure   Help from another person eating meals?: Total Help from another person taking care of personal grooming?: A Lot Help from another person toileting, which includes using toliet, bedpan, or urinal?: Total Help from another person bathing (including washing, rinsing, drying)?: Total Help from another person to put on and taking off regular upper body clothing?: Total Help from another person to put on and taking off regular lower body clothing?: Total 6 Click Score: 7    End of Session    OT Visit Diagnosis: Unsteadiness on feet (R26.81);Muscle weakness (generalized) (M62.81);Other symptoms and signs involving cognitive function;Cognitive communication deficit (R41.841);Hemiplegia and hemiparesis;Other abnormalities of gait and mobility (R26.89);Low vision, both eyes (H54.2);Feeding difficulties (R63.3);Other symptoms and signs involving the nervous system (R29.898) Symptoms and signs involving cognitive functions: Other Nontraumatic ICH Hemiplegia - Right/Left: Right Hemiplegia - dominant/non-dominant: Dominant Hemiplegia - caused by: Other Nontraumatic intracranial hemorrhage   Activity Tolerance Patient limited by lethargy   Patient Left in bed;with call bell/phone within reach;with bed alarm set (modified chair position)   Nurse Communication Mobility status;Other (comment) (positioning)        Time: 1941-7408 OT Time Calculation (min): 27  min  Charges: OT General Charges $OT Visit: 1 Visit OT Treatments $Self Care/Home Management : 8-22 mins $Therapeutic Activity: 8-22 mins  Luisa Dago, OT/L   Acute OT Clinical Specialist Acute Rehabilitation Services Pager [phone] Office [phone]    [facility] 05/25/2021, 11:06 AM

## 2021-05-25 NOTE — Progress Notes (Signed)
SLP Cancellation Note  Patient Details Name: Marissa Morris MRN: [id number] DOB: 23-Nov-1961   Cancelled treatment:        Notes read re: somnolence and pt was not very responsive for PT. Will continue efforts   Royce Macadamia 05/25/2021, 3:55 PM  Breck Coons Lonell Face.Ed Sports administrator Pager [phone] Office [phone] s

## 2021-05-26 ENCOUNTER — Inpatient Hospital Stay (HOSPITAL_COMMUNITY): Payer: BLUE CROSS/BLUE SHIELD

## 2021-05-26 DIAGNOSIS — R509 Fever, unspecified: Secondary | ICD-10-CM

## 2021-05-26 DIAGNOSIS — G9349 Other encephalopathy: Secondary | ICD-10-CM

## 2021-05-26 DIAGNOSIS — I5032 Chronic diastolic (congestive) heart failure: Secondary | ICD-10-CM

## 2021-05-26 DIAGNOSIS — G936 Cerebral edema: Secondary | ICD-10-CM

## 2021-05-26 LAB — BASIC METABOLIC PANEL
Anion gap: 6 (ref 5–15)
BUN: 20 mg/dL (ref 6–20)
CO2: 27 mmol/L (ref 22–32)
Calcium: 8.5 mg/dL — ABNORMAL LOW (ref 8.9–10.3)
Chloride: 95 mmol/L — ABNORMAL LOW (ref 98–111)
Creatinine, Ser: 0.58 mg/dL (ref 0.44–1.00)
GFR, Estimated: >60 mL/min (ref >60)
Glucose, Bld: 128 mg/dL — ABNORMAL HIGH (ref 70–99)
Potassium: 4.3 mmol/L (ref 3.5–5.1)
Sodium: 128 mmol/L — ABNORMAL LOW (ref 135–145)

## 2021-05-26 LAB — GLUCOSE, CAPILLARY
Glucose-Capillary: 124 mg/dL — ABNORMAL HIGH (ref 70–99)
Glucose-Capillary: 132 mg/dL — ABNORMAL HIGH (ref 70–99)
Glucose-Capillary: 137 mg/dL — ABNORMAL HIGH (ref 70–99)
Glucose-Capillary: 148 mg/dL — ABNORMAL HIGH (ref 70–99)
Glucose-Capillary: 155 mg/dL — ABNORMAL HIGH (ref 70–99)
Glucose-Capillary: 163 mg/dL — ABNORMAL HIGH (ref 70–99)

## 2021-05-26 LAB — CBC
HCT: 37.7 % (ref 36.0–46.0)
Hemoglobin: 12.7 g/dL (ref 12.0–15.0)
MCH: 32.2 pg (ref 26.0–34.0)
MCHC: 33.7 g/dL (ref 30.0–36.0)
MCV: 95.7 fL (ref 80.0–100.0)
Platelets: 266 10*3/uL (ref 150–400)
RBC: 3.94 MIL/uL (ref 3.87–5.11)
RDW: 12.3 % (ref 11.5–15.5)
WBC: 16.6 10*3/uL — ABNORMAL HIGH (ref 4.0–10.5)
nRBC: 0 % (ref 0.0–0.2)

## 2021-05-26 LAB — TRIGLYCERIDES: Triglycerides: 108 mg/dL (ref <150)

## 2021-05-26 IMAGING — CT CT HEAD W/O CM
3 series · 15 of 47 positions shown, 18 images · non-contrast
Comparison: [DATE]

CLINICAL DATA: Intracranial hemorrhage, follow-up

EXAM:
CT HEAD WITHOUT CONTRAST
TECHNIQUE: Contiguous axial images were obtained from the base of the skull
through the vertex without intravenous contrast.

[Series 4: head 5.0 h30s · axial · 0.46mm/px · z∈[-146,-6]mm · 9 of 34 slices shown, 12 images]
[im 3/34  brain]
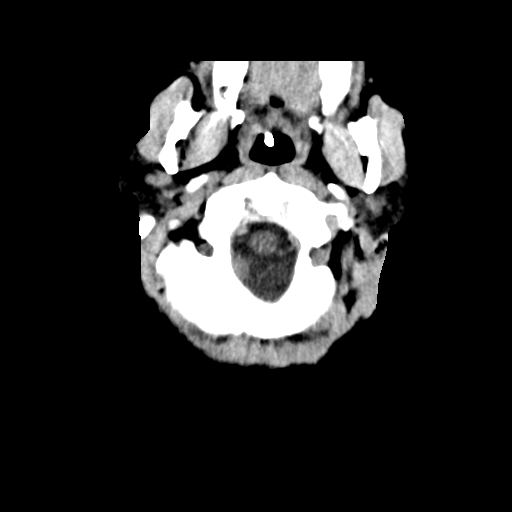
[im 3/34  bone]
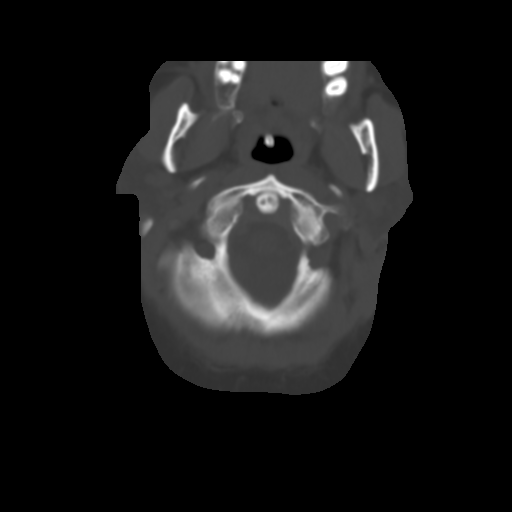
[im 6/34  brain]
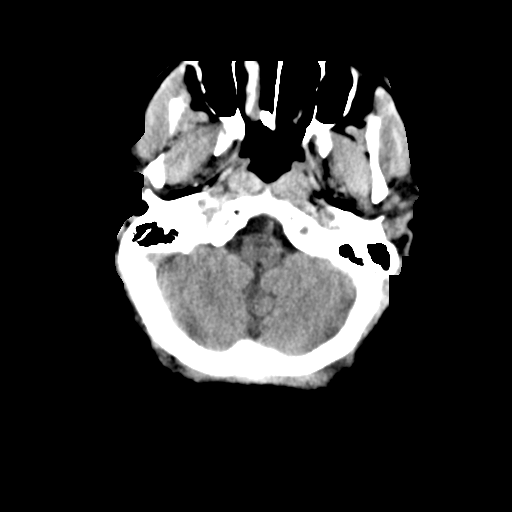
[im 10/34  brain]
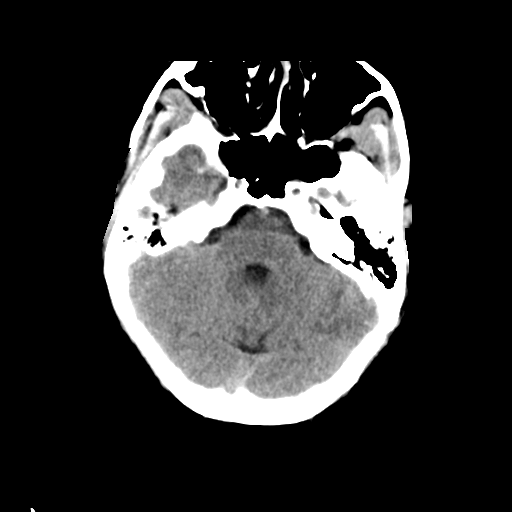
[im 13/34  brain]
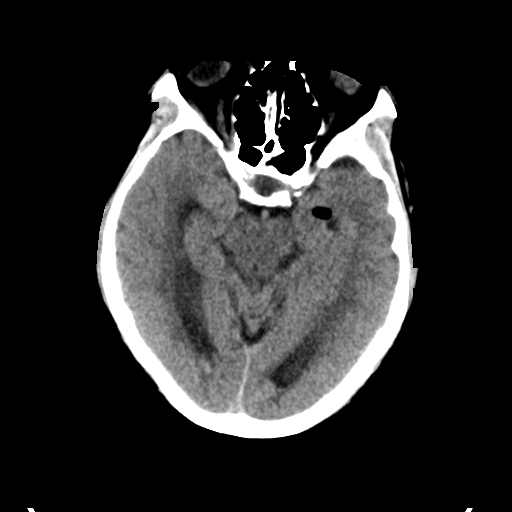
[im 18/34  brain]
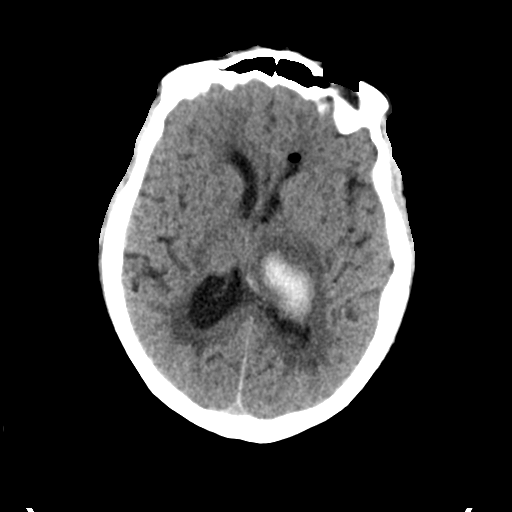
[im 18/34  bone]
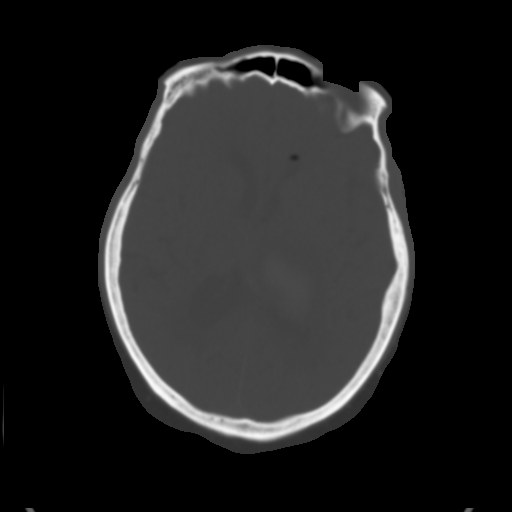
[im 21/34  brain]
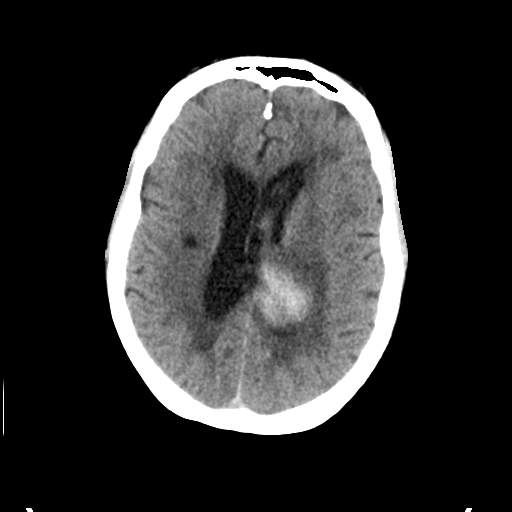
[im 24/34  brain]
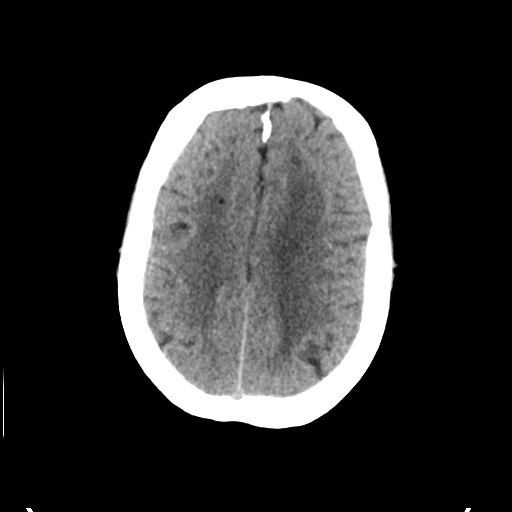
[im 28/34  brain]
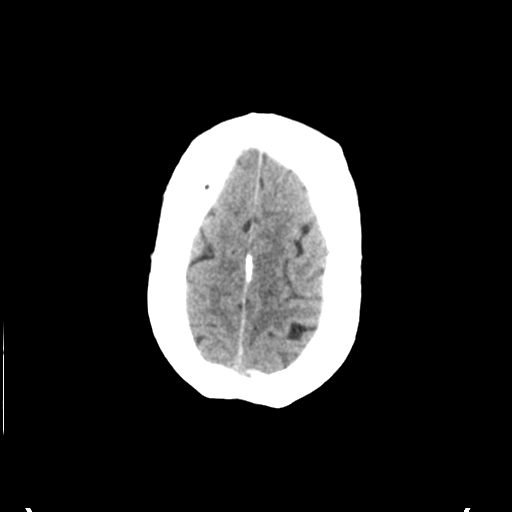
[im 31/34  brain]
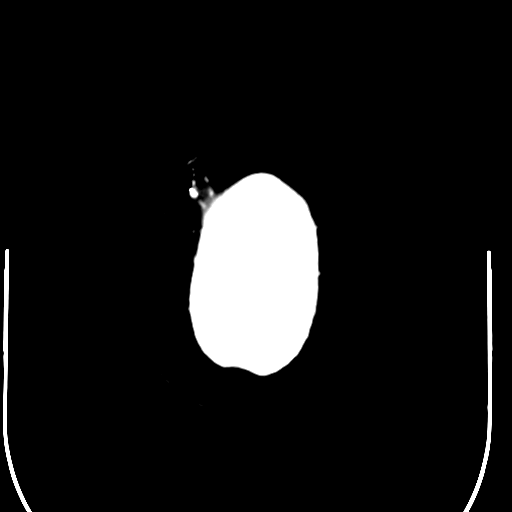
[im 31/34  bone]
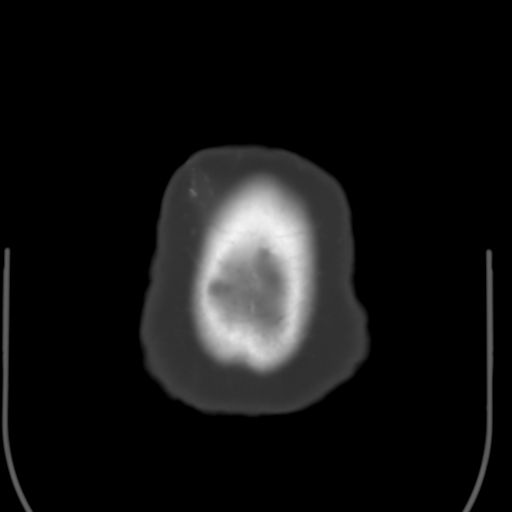

[Series 5: head 3.0 mpr cor · coronal · 0.36mm/px · 3 of 68 slices shown]
[im 23/68  brain]
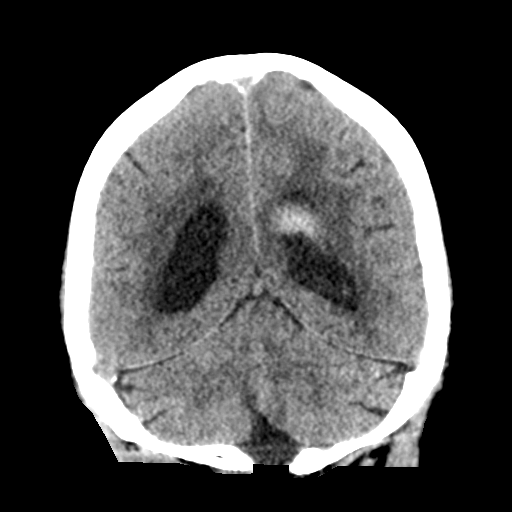
[im 30/68  brain]
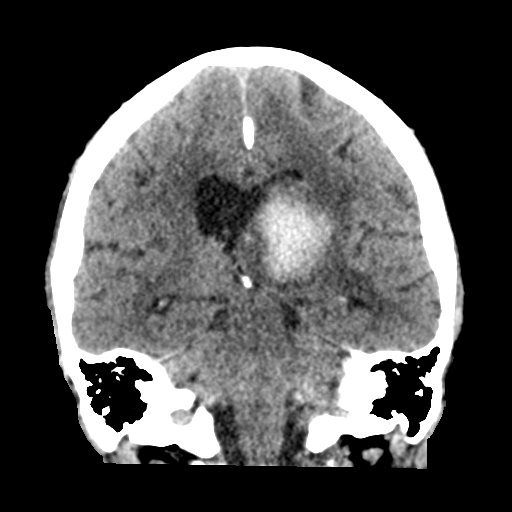
[im 38/68  brain]
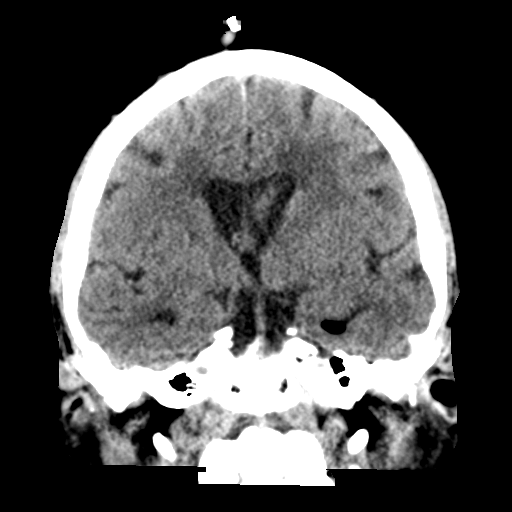

[Series 6: head 3.0 mpr sag · sagittal · 0.34mm/px · 3 of 55 slices shown]
[im 19/55  brain]
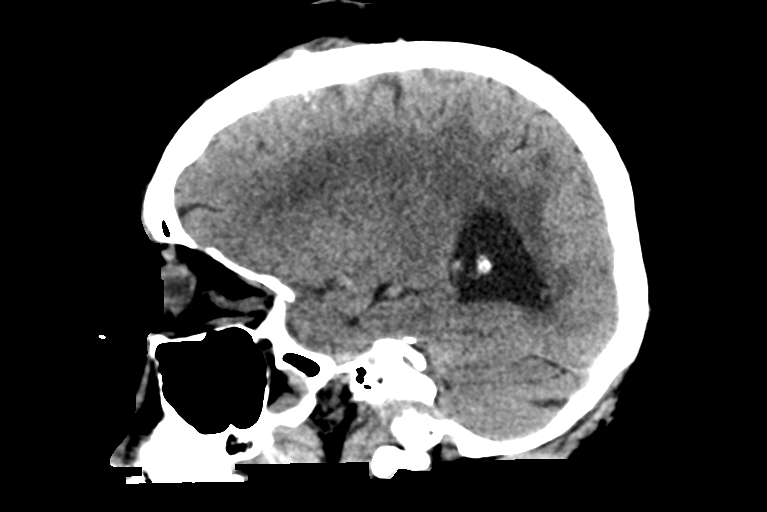
[im 28/55  brain]
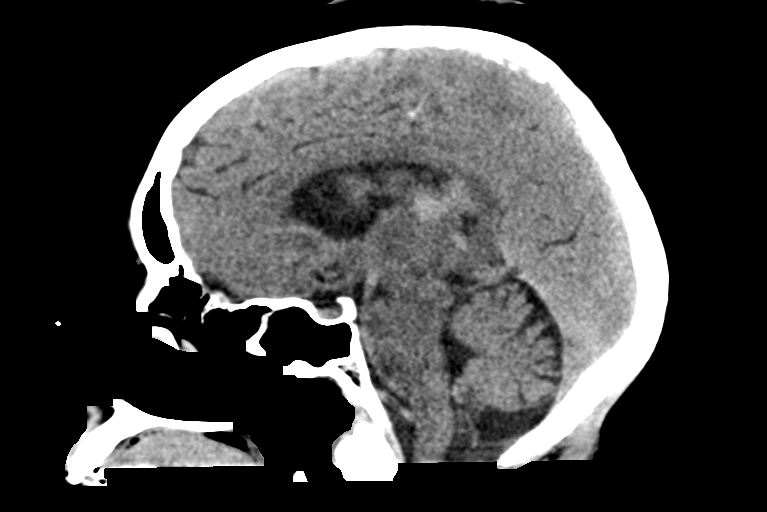
[im 37/55  brain]
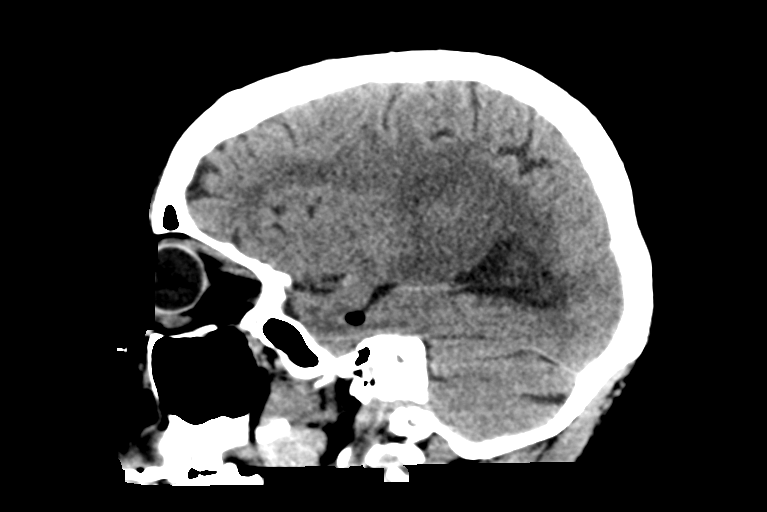

[15 of 47 positions shown; findings below may reference images not displayed]

FINDINGS: Brain: Interval removal of right frontal approach ventricular
catheter. There is minimal postprocedural air in the left lateral
ventricle. Intraventricular hemorrhage has significantly decreased.
Ventricle caliber is similar. Evolving hemorrhage centered within
the left thalamus as again identified with similar surrounding
edema. Similar mass effect including effacement of the third
ventricle and adjacent left lateral ventricle.

Stable findings of chronic microvascular ischemic changes. Chronic
small vessel infarcts of the central white matter bilaterally.

Vascular: No new finding.

Skull: Calvarium is unremarkable apart from small right frontal burr
hole.

Sinuses/Orbits: No acute finding.

Other: None.
IMPRESSION: Interval removal of ventricular catheter. Significantly decreased
intraventricular hemorrhage. Ventricle caliber is similar.

Evolving left thalamic hemorrhage and edema. No new hemorrhage or
worsening of mass effect.

## 2021-05-26 MED ORDER — SODIUM CHLORIDE 0.9 % IV SOLN: INTRAVENOUS | Status: DC

## 2021-05-26 NOTE — Progress Notes (Signed)
  Speech Language Pathology Treatment: Dysphagia;Cognitive-Linquistic  Patient Details Name: Marissa Morris MRN: [id number] DOB: May 27, 1961 Today's Date: 05/26/2021 Time: 1017-5102 SLP Time Calculation (min) (ACUTE ONLY): 16.57 min  Assessment / Plan / Recommendation Clinical Impression  Marissa Morris had a good session today! Alert and participatory.  Able to communicate with over-learned, social responses- e.g., hi, bye, appreciate it, thanks, ok.  She named her partner "Marissa Morris." She was able to repeat names of common objects (spoon and cup) with visual cues, verbal model, and cloze phrase.    Pt accepted ice chips and sips of water with excellent attention, no oral leakage, no s/s of aspiration. Recommend proceeding with MBS tomorrow if she continues to be alert.     HPI HPI: Marissa Morris is a 60 y.o. female presenting to [facility] in transfer from [facility] for management of acute intracranial hemorrhage. 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 Moderate chronic microvascular ischemic changes. No PMH on file.      SLP Plan  Continue with current plan of care       Recommendations  Diet recommendations: NPO Medication Administration: Via alternative means                Oral Care Recommendations: Oral care QID Follow up Recommendations: Inpatient Rehab SLP Visit Diagnosis: Cognitive communication deficit (H85.277) Plan: Continue with current plan of care       GO               Marissa Morris L. Samson Frederic, MA CCC/SLP Acute Rehabilitation Services Office number [phone] Pager [phone]  Marissa Morris Marissa Morris 05/26/2021, 1:37 PM

## 2021-05-26 NOTE — Progress Notes (Signed)
STROKE TEAM PROGRESS NOTE   SUBJECTIVE (INTERVAL HISTORY) No family at the bedside. Pt lying in bed, eyes open, lethargic, smile to provider but still aphasic with left hemiplegia. Repeat CT showed significant improved IVH and evolving ICH. Still has leukocytosis but improving and no fever.   OBJECTIVE Temp:  [98.4 F (36.9 C)-99.5 F (37.5 C)] 99.3 F (37.4 C) (06/14 1737) Pulse Rate:  [64-96] 78 (06/14 1737) Cardiac Rhythm: Normal sinus rhythm (06/14 0700) Resp:  [14-19] 15 (06/14 1737) BP: (113-152)/(72-87) 133/72 (06/14 1737) SpO2:  [97 %-98 %] 97 % (06/14 1737) Weight:  [76.8 kg] 76.8 kg (06/14 0436)  Recent Labs  Lab 05/25/21 2337 05/26/21 0355 05/26/21 0820 05/26/21 1134 05/26/21 1735  GLUCAP 146* 163* 148* 132* 137*   Recent Labs  Lab 05/21/21 0259 05/23/21 0717 05/24/21 1026 05/25/21 0350 05/26/21 0602  NA 135 135 132* 131* 128*  K 3.8 4.3 4.7 4.5 4.3  CL 97* 100 97* 96* 95*  CO2 25 26 24 27 27   GLUCOSE 158* 184* 164* 167* 128*  BUN 32* 28* 33* 26* 20  CREATININE 0.78 0.72 0.66 0.58 0.58  CALCIUM 8.3* 8.6* 8.6* 8.6* 8.5*  MG 2.6*  --   --   --   --   PHOS 4.2  --   --   --   --    Recent Labs  Lab 05/24/21 1026  AST 32  ALT 33  ALKPHOS 62  BILITOT 0.6  PROT 6.8  ALBUMIN 3.3*    Recent Labs  Lab 05/21/21 0259 05/23/21 0717 05/24/21 0227 05/25/21 0350 05/26/21 0602  WBC 12.0* 14.3* 16.5* 17.4* 16.6*  HGB 13.8 12.9 12.9 13.6 12.7  HCT 42.3 40.0 39.8 41.3 37.7  MCV 97.0 97.6 99.0 96.7 95.7  PLT 202 246 245 263 266   No results for input(s): CKTOTAL, CKMB, CKMBINDEX, TROPONINI in the last 168 hours. No results for input(s): LABPROT, INR in the last 72 hours. Recent Labs    05/24/21 0805  COLORURINE YELLOW  LABSPEC 1.028  PHURINE 5.0  GLUCOSEU 50*  HGBUR NEGATIVE  BILIRUBINUR NEGATIVE  KETONESUR NEGATIVE  PROTEINUR NEGATIVE  NITRITE NEGATIVE  LEUKOCYTESUR NEGATIVE       Component Value Date/Time   CHOL 203 (H) 05/14/2021 0022    TRIG 108 05/26/2021 0602   HDL 68 05/14/2021 0022   CHOLHDL 3.0 05/14/2021 0022   VLDL 14 05/14/2021 0022   LDLCALC 121 (H) 05/14/2021 0022   Lab Results  Component Value Date   HGBA1C 5.7 (H) 05/14/2021      Component Value Date/Time   LABOPIA NONE DETECTED 05/14/2021 0254   COCAINSCRNUR NONE DETECTED 05/14/2021 0254   LABBENZ NONE DETECTED 05/14/2021 0254   AMPHETMU NONE DETECTED 05/14/2021 0254   THCU NONE DETECTED 05/14/2021 0254   LABBARB NONE DETECTED 05/14/2021 0254    No results for input(s): ETH in the last 168 hours.  I have personally reviewed the radiological images below and agree with the radiology interpretations.  DG Abd 1 View  Result Date: 05/25/2021 CLINICAL DATA:  Diarrhea EXAM: ABDOMEN - 1 VIEW COMPARISON:  May 15, 2021 FINDINGS: Feeding tube tip in distal stomach. Mild stool volume in the colon. There is no bowel dilatation or air-fluid level to suggest bowel obstruction. No free air. Focus of calcification in the right pelvis measuring 2 x 2 cm, a likely calcified uterine leiomyoma. IMPRESSION: Feeding tube tip in distal stomach. No bowel obstruction or free air. Mild stool volume in colon. 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 Electronically Signed   By: Bretta Bang III M.D.   On: 05/25/2021 16:27   DG Abd 1 View  Result Date: 05/14/2021 CLINICAL DATA:  60 year old female with intracranial hemorrhage. EXAM: ABDOMEN - 1 VIEW COMPARISON:  Portable pelvis radiograph today reported separately. FINDINGS: Portable AP supine view at 0344 hours. Non obstructed bowel gas pattern. No definite pneumoperitoneum on this supine view. Minimally included left lung base appears grossly negative. Scoliosis and degenerative changes in the spine. Pelvic dystrophic and/or vascular calcifications, mild. No acute osseous abnormality identified. IMPRESSION: 1. Normal bowel gas pattern. 2. Spinal scoliosis and degeneration. Electronically Signed   By: Odessa Fleming M.D.    On: 05/14/2021 06:19   CT HEAD WO CONTRAST  Result Date: 05/26/2021 CLINICAL DATA:  Intracranial hemorrhage, follow-up EXAM: CT HEAD WITHOUT CONTRAST TECHNIQUE: Contiguous axial images were obtained from the base of the skull through the vertex without intravenous contrast. COMPARISON:  05/21/2021 FINDINGS: Brain: Interval removal of right frontal approach ventricular catheter. There is minimal postprocedural air in the left lateral ventricle. Intraventricular hemorrhage has significantly decreased. Ventricle caliber is similar. Evolving hemorrhage centered within the left thalamus as again identified with similar surrounding edema. Similar mass effect including effacement of the third ventricle and adjacent left lateral ventricle. Stable findings of chronic microvascular ischemic changes. 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 Vascular: No new finding. Skull: Calvarium is unremarkable apart from small right frontal burr hole. Sinuses/Orbits: No acute finding. Other: None. IMPRESSION: Interval removal of ventricular catheter. Significantly decreased intraventricular hemorrhage. Ventricle caliber is similar. Evolving left thalamic hemorrhage and edema. No new hemorrhage or worsening of mass effect. Electronically Signed   By: Guadlupe Spanish M.D.   On: 05/26/2021 12:50   CT HEAD WO CONTRAST  Result Date: 05/21/2021 CLINICAL DATA:  Stroke follow-up EXAM: CT HEAD WITHOUT CONTRAST TECHNIQUE: Contiguous axial images were obtained from the base of the skull through the vertex without intravenous contrast. COMPARISON:  Three days ago FINDINGS: Brain: Left thalamic hematoma is unchanged in size and shape, measuring up to 4.2 x 3.1 cm on axial slices. Intraventricular clot has decreased in thickness at the level of the lateral ventricles. No increased ventricular size. Chronic small vessel ischemia in the hemispheric white matter. Negative for cortical infarct. Chronic perforator infarct  at the right corona radiata. Stable positioning of EVD which traverses the frontal horn of the right lateral ventricle. Vascular: Negative Skull: Negative Sinuses/Orbits: Unremarkable IMPRESSION: 1. No recurrent hemorrhage.  Decreasing intraventricular clot. 2. Stable EVD positioning with unremarkable ventricular size. Electronically Signed   By: Marnee Spring M.D.   On: 05/21/2021 10:57   CT HEAD WO CONTRAST  Result Date: 05/18/2021 CLINICAL DATA:  60 year old female with left thalamic hemorrhage, IVH, EVD. EXAM: CT HEAD WITHOUT CONTRAST TECHNIQUE: Contiguous axial images were obtained from the base of the skull through the vertex without intravenous contrast. COMPARISON:  Head CT 05/15/2021 and earlier. FINDINGS: Brain: Right superior frontal approach EVD with catheter terminating at the 3rd ventricle and communicating with the right lateral ventricle on coronal image 35. The 3rd and 4th ventricles remain relatively decompressed. There is less 3rd and 4th ventricular IVH. Lateral ventricle size also mildly decreased since 05/15/2021, although with moderate residual lateral ventricular blood greater on the left. Oval left thalamic intra-axial hemorrhage encompasses 34 x 22 by 33 mm now (AP by transverse by CC) for an estimated blood volume of 12 mL, and has not changed since 05/15/2021. Regional  edema and mild regional mass effect are stable. Stable gray-white matter differentiation elsewhere with Patchy and confluent white matter hypodensity. No new cortically based infarct identified. Basilar cisterns are patent. No significant midline shift. Vascular: Calcified atherosclerosis at the skull base. Skull: Stable right superior burr hole. Sinuses/Orbits: Visualized paranasal sinuses and mastoids are stable and well aerated. Other: Right nasoenteric tube in place. Mild postoperative changes to the scalp vertex. Disconjugate gaze but otherwise negative orbits soft tissues. IMPRESSION: 1. Stable left thalamic  intra-axial hemorrhage size since 05/15/2021. Stable regional edema and mild regional mass effect. 2. Stable right frontal approach EVD with slightly decreased lateral ventricle size since 05/15/2021. Moderate lateral ventricle IVH persists. There is less blood in the small 3rd and 4th ventricles now. 3. Underlying chronic small vessel disease. No new intracranial abnormality. Electronically Signed   By: Odessa Fleming M.D.   On: 05/18/2021 06:31   CT HEAD WO CONTRAST  Result Date: 05/15/2021 CLINICAL DATA:  Cerebral hemorrhage suspected. EXAM: CT HEAD WITHOUT CONTRAST TECHNIQUE: Contiguous axial images were obtained from the base of the skull through the vertex without intravenous contrast. COMPARISON:  MRI and CT of the brain May 14, 2021. FINDINGS: Brain: No significant interval change size of the left thalamic hemorrhage with hematoma now measuring approximately 3.5 x 3.3 x 2 cm compared to 3.3 x 3.3 x 2.2 cm on prior. Persistent hypodensity surrounding the hematoma related to vasogenic edema. Supratentorial and infratentorial intraventricular hemorrhage related to hematoma decompression into the right lateral ventricle, status post right frontal approach ventricular drain placement. Drain tip is at level of the right foramen of Monro. There is mild interval decrease in size of the supratentorial ventricles, particularly the temporal horns. Patchy hypodensity of the white matter of the cerebral hemispheres, nonspecific most likely related to small vessel ischemia. Remote lacunar infarct in the right radiata. Vascular: No hyperdense vessel or unexpected calcification. Skull: Negative for fracture or focal lesion. Sinuses/Orbits: No acute finding. Other: None. IMPRESSION: 1. Interval placement of a right frontal approach ventricular drain with the tip at the level of the right foramen of Monro. Interval mild decrease in size of the ventricular system, particularly the temporal horns of the lateral ventricles. 2.  Stable appearance of left thalamic intraparenchymal hemorrhage with intraventricular extension. Electronically Signed   By: Baldemar Lenis M.D.   On: 05/15/2021 09:41   CT HEAD WO CONTRAST  Result Date: 05/14/2021 CLINICAL DATA:  Intraparenchymal hemorrhage.  Follow-up. EXAM: CT HEAD WITHOUT CONTRAST TECHNIQUE: Contiguous axial images were obtained from the base of the skull through the vertex without intravenous contrast. COMPARISON:  Same day CT head. FINDINGS: Mildly motion limited exam.  Within this limitation: Brain: When accounting for differences in technique, similar size of the acute intraparenchymal hemorrhage centered in the left thalamus which measures up to 3.3 x 2.2 x 3.3 cm (similar when remeasured on the prior). Similar regional mass effect. Similar intraventricular hemorrhage within the left greater than right lateral ventricles, third ventricle, and fourth ventricle. Similar ventriculomegaly with mild periventricular edema and prominence of the temporal horns. Additional scattered white matter hypoattenuation is nonspecific but most likely related to chronic microvascular ischemic disease. No evidence of acute large vascular territory infarct. Similar suspected prior infarct in the right corona radiata. Vascular: No hyperdense vessel identified. Calcific atherosclerosis. Skull: No acute fracture. Sinuses/Orbits: No acute findings. Other: No mastoid effusions. IMPRESSION: When accounting for differences in technique, similar size of the acute left thalamic intraparenchymal hemorrhage. Similar intraventricular extension of  hemorrhage and ventriculomegaly. Electronically Signed   By: Feliberto Harts MD   On: 05/14/2021 06:21   CT HEAD WO CONTRAST  Result Date: 05/14/2021 CLINICAL DATA:  Intraparenchymal hemorrhage. Follow-up of scans performed at [place] [place] [facility]. EXAM: CT HEAD WITHOUT CONTRAST TECHNIQUE: Contiguous axial images were obtained from the base of the skull  through the vertex without intravenous contrast. COMPARISON:  None. I am not able to view the outside institution scans. FINDINGS: Brain: Intraparenchymal hematoma centered in the left thalamus measures 3.2 x 2.2 x 3.0 cm (volume = 11 cm^3). There is moderate hydrocephalus with periventricular hypoattenuation suggesting transependymal interstitial edema. There is rightward bulging of the septum pellucidum. Large amount of blood in the ventricles. Vascular: No abnormal hyperdensity of the major intracranial arteries or dural venous sinuses. No intracranial atherosclerosis. Skull: The visualized skull base, calvarium and extracranial soft tissues are normal. Sinuses/Orbits: No fluid levels or advanced mucosal thickening of the visualized paranasal sinuses. No mastoid or middle ear effusion. The orbits are normal. IMPRESSION: 1. Intraparenchymal hematoma centered in the left thalamus with intraventricular extension and moderate hydrocephalus with transependymal interstitial edema. Critical Value/emergent results were called by telephone at the time of interpretation on 05/14/2021 at 12:58 am to provider ERIC [facility] , who verbally acknowledged these results. Electronically Signed   By: Deatra Robinson M.D.   On: 05/14/2021 00:58   MR MRA HEAD WO CONTRAST  Result Date: 05/14/2021 CLINICAL DATA:  Intracranial hemorrhage EXAM: MRI HEAD WITHOUT CONTRAST MRA HEAD WITHOUT CONTRAST TECHNIQUE: Multiplanar, multi-echo pulse sequences of the brain and surrounding structures were acquired without intravenous contrast. Angiographic images of the Circle of Willis were acquired using MRA technique without intravenous contrast. COMPARISON: No pertinent prior exam. COMPARISON:  Correlation made with prior head CT FINDINGS: MRI HEAD Brain: Left thalamic hemorrhage is again identified similar to the recent CT with acute blood products. There is mild surrounding edema. Significant intraventricular extension is present as seen on CT with  resulting hydrocephalus. Focus susceptibility in the right corona radiata reflects chronic microhemorrhage associated with a lacunar infarct. Additional patchy and confluent areas of T2 hyperintensity in the supratentorial white matter are nonspecific but probably reflect moderate chronic microvascular ischemic changes. No post contrast imaging performed therefore potential underlying lesion is not well evaluated. Vascular: Major vessel flow voids at the skull base are preserved. Skull and upper cervical spine: Normal marrow signal is preserved. Sinuses/Orbits: Paranasal sinuses are aerated. Orbits are unremarkable. Other: Sella is unremarkable.  Mastoid air cells are clear. MRA HEAD Intracranial internal carotid arteries are patent. Middle and anterior cerebral arteries are patent. Intracranial vertebral arteries, basilar artery, posterior cerebral arteries are patent. There is no significant stenosis or aneurysm. No abnormal flow related enhancement in the region of thalamic hemorrhage. IMPRESSION: No substantial change in left thalamic hemorrhage with significant intraventricular extension and hydrocephalus as well as mild edema and mass effect compared to recent CT. 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 Moderate chronic microvascular ischemic changes. Unremarkable MRA. Electronically Signed   By: Guadlupe Spanish M.D.   On: 05/14/2021 11:18   MR BRAIN WO CONTRAST  Result Date: 05/14/2021 CLINICAL DATA:  Intracranial hemorrhage EXAM: MRI HEAD WITHOUT CONTRAST MRA HEAD WITHOUT CONTRAST TECHNIQUE: Multiplanar, multi-echo pulse sequences of the brain and surrounding structures were acquired without intravenous contrast. Angiographic images of the Circle of Willis were acquired using MRA technique without intravenous contrast. COMPARISON: No pertinent prior exam. COMPARISON:  Correlation made with prior head  CT FINDINGS: MRI HEAD Brain: Left thalamic hemorrhage is  again identified similar to the recent CT with acute blood products. There is mild surrounding edema. Significant intraventricular extension is present as seen on CT with resulting hydrocephalus. Focus susceptibility in the right corona radiata reflects chronic microhemorrhage associated with a lacunar infarct. Additional patchy and confluent areas of T2 hyperintensity in the supratentorial white matter are nonspecific but probably reflect moderate chronic microvascular ischemic changes. No post contrast imaging performed therefore potential underlying lesion is not well evaluated. Vascular: Major vessel flow voids at the skull base are preserved. Skull and upper cervical spine: Normal marrow signal is preserved. Sinuses/Orbits: Paranasal sinuses are aerated. Orbits are unremarkable. Other: Sella is unremarkable.  Mastoid air cells are clear. MRA HEAD Intracranial internal carotid arteries are patent. Middle and anterior cerebral arteries are patent. Intracranial vertebral arteries, basilar artery, posterior cerebral arteries are patent. There is no significant stenosis or aneurysm. No abnormal flow related enhancement in the region of thalamic hemorrhage. IMPRESSION: No substantial change in left thalamic hemorrhage with significant intraventricular extension and hydrocephalus as well as mild edema and mass effect compared to recent CT. 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 Moderate chronic microvascular ischemic changes. Unremarkable MRA. Electronically Signed   By: Guadlupe SpanishPraneil  Patel M.D.   On: 05/14/2021 11:18   DG Pelvis Portable  Result Date: 05/14/2021 CLINICAL DATA:  60 year old female with intracranial hemorrhage. EXAM: PORTABLE PELVIS 1-2 VIEWS COMPARISON:  None. FINDINGS: Portable AP view at 0346 hours. Mild likely fibroid associated dystrophic calcifications in the pelvis eccentric to the right. Femoral heads normally located. Pelvis and proximal femurs  appear intact. No acute osseous abnormality identified. Negative visible bowel gas pattern. IMPRESSION: Negative. Electronically Signed   By: Odessa FlemingH  Hall M.D.   On: 05/14/2021 06:18   DG Chest Port 1 View  Result Date: 05/24/2021 CLINICAL DATA:  CVA.  Shortness of breath. EXAM: PORTABLE CHEST 1 VIEW COMPARISON:  05/17/2021 FINDINGS: Feeding tube extends beyond the inferior aspect of the film. Midline trachea. Moderate cardiomegaly. Mild right hemidiaphragm elevation. The left costophrenic angle is minimally excluded. No pleural effusion or pneumothorax. No congestive failure. No lobar consolidation. IMPRESSION: Cardiomegaly without congestive failure. Electronically Signed   By: Jeronimo GreavesKyle  Talbot M.D.   On: 05/24/2021 09:14   DG CHEST PORT 1 VIEW  Result Date: 05/17/2021 CLINICAL DATA:  Respiratory failure EXAM: PORTABLE CHEST 1 VIEW COMPARISON:  May 14, 2021 FINDINGS: Feeding tube tip is below the diaphragm. No pneumothorax. There is no edema or airspace opacity. There is cardiomegaly with pulmonary vascularity normal. No adenopathy. No bone lesions. IMPRESSION: No edema or airspace opacity. Cardiomegaly, stable. Feeding tube tip below diaphragm. Electronically Signed   By: Bretta BangWilliam  Woodruff III M.D.   On: 05/17/2021 14:17   DG CHEST PORT 1 VIEW  Result Date: 05/14/2021 CLINICAL DATA:  Provided history: Follow-up. EXAM: PORTABLE CHEST 1 VIEW COMPARISON:  None. FINDINGS: Cardiomediastinal silhouette is enlarged, likely accentuated by AP technique. Calcific atherosclerosis of the aorta. Low lung volumes. No consolidation. EKG leads bilaterally. No visible pleural effusions or pneumothorax on this single semi erect AP radiograph. IMPRESSION: 1. Low lung volumes without evidence of acute cardiopulmonary disease. 2. Cardiomegaly. Electronically Signed   By: Feliberto HartsFrederick S Jones MD   On: 05/14/2021 06:13   DG Abd Portable 1V  Result Date: 05/15/2021 CLINICAL DATA:  Feeding tube placement. EXAM: PORTABLE ABDOMEN - 1 VIEW  COMPARISON:  None. FINDINGS: Feeding tube is in place with  the tip in the distal stomach directed toward the duodenum. IMPRESSION: As above. Electronically Signed   By: Drusilla Kanner M.D.   On: 05/15/2021 11:40   ECHOCARDIOGRAM COMPLETE  Result Date: 05/16/2021    ECHOCARDIOGRAM REPORT   Patient Name:   AZELIE NOGUERA Date of Exam: 05/14/2021 Medical Rec #:  [id number]    Height: Accession #:    [id number]   Weight:       166.0 lb Date of Birth:  02/26/1961    BSA:          1.783 m Patient Age:    59 years     BP:           122/69 mmHg Patient Gender: F            HR:           77 bpm. Exam Location:  Inpatient Procedure: 2D Echo, Cardiac Doppler and Color Doppler Indications:    Stroke  History:        Patient has no prior history of Echocardiogram examinations.  Sonographer:    Ross Ludwig RDCS (AE) Referring Phys: [phone] ERIC LINDZEN  Sonographer Comments: Patient not responsive to request to sniff for IVC collapse test. IMPRESSIONS  1. Left ventricular ejection fraction, by estimation, is 50 to 55%. The left ventricle has low normal function. The left ventricle has no regional wall motion abnormalities. There is moderate thickening of the septum. The rest of the LV segments demonstrate mild left ventricular hypertrophy. Left ventricular diastolic parameters are consistent with Grade II diastolic dysfunction (pseudonormalization). Elevated left ventricular end-diastolic pressure.  2. The average left ventricular global longitudinal strain is -6.7 %. The global longitudinal strain is abnormal. Strain pattern shows relative apical sparring which may be suggestive of amyloid in the right clinic context.  3. Right ventricular systolic function is normal. The right ventricular size is normal.  4. The mitral valve is normal in structure. Trivial mitral valve regurgitation. No evidence of mitral stenosis.  5. The aortic valve is tricuspid. There is mild calcification of the aortic valve. There is mild thickening of the  aortic valve. Aortic valve regurgitation is not visualized. Mild aortic valve sclerosis is present, with no evidence of aortic valve stenosis.  6. The inferior vena cava is dilated in size with <50% respiratory variability, suggesting right atrial pressure of 15 mmHg.  7. Left atrial size was mildly dilated. Comparison(s): No prior Echocardiogram. Conclusion(s)/Recommendation(s): No intracardiac source of embolism detected on this transthoracic study. A transesophageal echocardiogram is recommended to exclude cardiac source of embolism if clinically indicated. FINDINGS  Left Ventricle: Left ventricular ejection fraction, by estimation, is 50 to 55%. The left ventricle has low normal function. The left ventricle has no regional wall motion abnormalities. The average left ventricular global longitudinal strain is -6.7 %.  The global longitudinal strain is abnormal. The left ventricular internal cavity size was normal in size. There is moderate thickening of the septum. The rest of the LV segments demonstrate mild left ventricular hypertrophy. Left ventricular diastolic parameters are consistent with Grade II diastolic dysfunction (pseudonormalization). Elevated left ventricular end-diastolic pressure. Right Ventricle: The right ventricular size is normal. No increase in right ventricular wall thickness. Right ventricular systolic function is normal. Left Atrium: Left atrial size was mildly dilated. Right Atrium: Right atrial size was normal in size. Pericardium: There is no evidence of pericardial effusion. Mitral Valve: The mitral valve is normal in structure. There is mild thickening of the mitral valve leaflet(s). There  is mild calcification of the mitral valve leaflet(s). Mild mitral annular calcification. Trivial mitral valve regurgitation. No evidence  of mitral valve stenosis. MV peak gradient, 7.1 mmHg. The mean mitral valve gradient is 4.0 mmHg. Tricuspid Valve: The tricuspid valve is normal in structure.  Tricuspid valve regurgitation is trivial. No evidence of tricuspid stenosis. Aortic Valve: The aortic valve is tricuspid. There is mild calcification of the aortic valve. There is mild thickening of the aortic valve. Aortic valve regurgitation is not visualized. Mild aortic valve sclerosis is present, with no evidence of aortic valve stenosis. Aortic valve mean gradient measures 8.0 mmHg. Aortic valve peak gradient measures 16.3 mmHg. Aortic valve area, by VTI measures 2.05 cm. Pulmonic Valve: The pulmonic valve was normal in structure. Pulmonic valve regurgitation is trivial. No evidence of pulmonic stenosis. Aorta: The aortic root and ascending aorta are structurally normal, with no evidence of dilitation. Venous: The inferior vena cava is dilated in size with less than 50% respiratory variability, suggesting right atrial pressure of 15 mmHg. IAS/Shunts: No atrial level shunt detected by color flow Doppler.  LEFT VENTRICLE PLAX 2D LVIDd:         4.70 cm  Diastology LVIDs:         4.20 cm  LV e' medial:    5.55 cm/s LV PW:         1.60 cm  LV E/e' medial:  23.2 LV IVS:        1.80 cm  LV e' lateral:   6.31 cm/s LVOT diam:     1.80 cm  LV E/e' lateral: 20.4 LV SV:         69 LV SV Index:   39       2D Longitudinal Strain LVOT Area:     2.54 cm 2D Strain GLS Avg:     -6.7 %  RIGHT VENTRICLE             IVC RV Basal diam:  3.50 cm     IVC diam: 2.30 cm RV S prime:     13.20 cm/s TAPSE (M-mode): 3.2 cm LEFT ATRIUM             Index       RIGHT ATRIUM           Index LA diam:        4.10 cm 2.30 cm/m  RA Area:     16.30 cm LA Vol (A2C):   42.1 ml 23.61 ml/m RA Volume:   45.00 ml  25.23 ml/m LA Vol (A4C):   43.6 ml 24.45 ml/m LA Biplane Vol: 47.3 ml 26.52 ml/m  AORTIC VALVE AV Area (Vmax):    1.86 cm AV Area (Vmean):   2.09 cm AV Area (VTI):     2.05 cm AV Vmax:           202.00 cm/s AV Vmean:          135.000 cm/s AV VTI:            0.336 m AV Peak Grad:      16.3 mmHg AV Mean Grad:      8.0 mmHg LVOT Vmax:          148.00 cm/s LVOT Vmean:        111.000 cm/s LVOT VTI:          0.271 m LVOT/AV VTI ratio: 0.81  AORTA Ao Root diam: 2.90 cm Ao Asc diam:  3.20 cm MITRAL VALVE MV Area (PHT): 3.08  cm     SHUNTS MV Area VTI:   1.83 cm     Systemic VTI:  0.27 m MV Peak grad:  7.1 mmHg     Systemic Diam: 1.80 cm MV Mean grad:  4.0 mmHg MV Vmax:       1.33 m/s MV Vmean:      90.6 cm/s MV Decel Time: 246 msec MV E velocity: 129.00 cm/s MV A velocity: 118.00 cm/s MV E/A ratio:  1.09 Laurance Flatten MD Electronically signed by Laurance Flatten MD Signature Date/Time: 05/16/2021/7:51:23 AM    Final    VAS US CAROTID  Result Date: 05/15/2021 Carotid Arterial Duplex Study Patient Name:  ADALEEN HULGAN  Date of Exam:   05/14/2021 Medical Rec #: [id number]     Accession #:    [id number] Date of Birth: [birth year]     Patient Gender: F Patient Age:   56Y Exam Location:  [facility] Procedure:      VAS US CAROTID Referring Phys: [id number] Marvel Plan --------------------------------------------------------------------------------  Indications:       Hypertensive emergency (122/128). 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 Risk Factors:      Hypertension. Comparison Study:  No prior study on file Performing Technologist: Sherren Kerns RVS  Examination Guidelines: A complete evaluation includes B-mode imaging, spectral Doppler, color Doppler, and power Doppler as needed of all accessible portions of each vessel. Bilateral testing is considered an integral part of a complete examination. Limited examinations for reoccurring indications may be performed as noted.  Right Carotid Findings: +----------+--------+--------+--------+------------------+--------+           PSV cm/sEDV cm/sStenosisPlaque DescriptionComments +----------+--------+--------+--------+------------------+--------+ CCA Prox  96      9                                           +----------+--------+--------+--------+------------------+--------+ CCA Distal104     13                                         +----------+--------+--------+--------+------------------+--------+ ICA Prox  66      15      1-39%   heterogenous               +----------+--------+--------+--------+------------------+--------+ ICA Distal54      13                                         +----------+--------+--------+--------+------------------+--------+ ECA       151     22                                         +----------+--------+--------+--------+------------------+--------+ +----------+--------+-------+--------+-------------------+           PSV cm/sEDV cmsDescribeArm Pressure (mmHG) +----------+--------+-------+--------+-------------------+ Subclavian150                                        +----------+--------+-------+--------+-------------------+ +---------+--------+--+--------+--+---------+ VertebralPSV cm/s41EDV cm/s12Antegrade +---------+--------+--+--------+--+---------+  Left Carotid Findings: +----------+--------+--------+--------+------------------+------------------+           PSV cm/sEDV cm/sStenosisPlaque DescriptionComments           +----------+--------+--------+--------+------------------+------------------+ CCA Prox  180     15                                                   +----------+--------+--------+--------+------------------+------------------+ CCA Distal98      13                                intimal thickening +----------+--------+--------+--------+------------------+------------------+ ICA Prox  70      22      1-39%                     intimal thickening +----------+--------+--------+--------+------------------+------------------+ ICA Distal68      22                                                   +----------+--------+--------+--------+------------------+------------------+ ECA       91       18                                                   +----------+--------+--------+--------+------------------+------------------+ +----------+--------+--------+--------+-------------------+           PSV cm/sEDV cm/sDescribeArm Pressure (mmHG) +----------+--------+--------+--------+-------------------+ [id number]                                          +----------+--------+--------+--------+-------------------+ +---------+--------+--+--------+--+---------+ VertebralPSV cm/s61EDV cm/s13Antegrade +---------+--------+--+--------+--+---------+   Summary: Right Carotid: Velocities in the right ICA are consistent with a 1-39% stenosis. Left Carotid: Velocities in the left ICA are consistent with a 1-39% stenosis. Vertebrals: Bilateral vertebral arteries demonstrate antegrade flow. *See table(s) above for measurements and observations.  Electronically signed by Marvel Plan MD on 05/15/2021 at 2:54:40 PM.    Final       PHYSICAL EXAM Middle-aged African-American lady not in distress Temp:  [98.4 F (36.9 C)-99.5 F (37.5 C)] 99.3 F (37.4 C) (06/14 1737) Pulse Rate:  [64-96] 78 (06/14 1737) Resp:  [14-19] 15 (06/14 1737) BP: (113-152)/(72-87) 133/72 (06/14 1737) SpO2:  [97 %-98 %] 97 % (06/14 1737) Weight:  [76.8 kg] 76.8 kg (06/14 0436)  General - Well nourished, well developed, in no apparent distress, lethargic.  Ophthalmologic - fundi not visualized due to noncooperation.  Cardiovascular - Regular rhythm and rate.  Neurological Exam  - lethargic but eyes open, smile to me, but still has global aphasia, no spontaneous speech but able to repeat 3-word sentence but not 5-word sentence, not able to name.  Not follow commands except eye close/open. Eyes tracking bilaterally, blinking to visual threat on the left but not on the right. Right facial droop. Tongue protrusion not cooperative. Spontaneously moving LUE against gravity, no drift, RUE flaccid.  LLE 3/5 spontaneously,  withdraw 2-/5 at RLE. Sensation, coordination not cooperative and gait not tested.  ASSESSMENT/PLAN Ms. Marissa Morris is a 60 y.o. female with no significant history admitted for N/V, right sided weakness and facial droop. No tPA given due to ICH.    ICH with IVH:  Left thalamic ICH with extensive IVH -  secondary to hypertensive emergency CT head left thalamic ICH with IVH Repeat CT head 05/21/21 stable ICH and ventricles Repeat CT head 6/14 evolving ICH and significantly improved IVH MRI  Stable ICH and ventricles MRA  Unremarkable.  Carotid Doppler  <50%  stenosis bilaterally 2D Echo  Normal EF 50-55%, left ventricular global longitudinal strain is  apical sparring which may be suggestive of amyloid in the right clinic context.   LDL 121 HgbA1c 5.7 VTE prophylaxis; SCDs, Heparin SQ No antithrombotic prior to admission, now on No antithrombotic due to ICH Ongoing aggressive stroke risk factor management Therapy recommendations:  CIR Disposition:  Pending   Hydrocephalus  Repeat CT 6/6 mildly decreased ventricle size s/p EVD 6/2 Right frontal EVD removed 6/10 CT repeat 6/14 significantly improved IVH, stable ventricle size  Hypertensive emergency  Now on Amlodipine  daily, Hydralazine  q 8, Lisinopril 40 mg daily and Labetalol 200 TID   On labetalol PRN BP stable Long term BP goal normotensive  Hyperlipidemia Home meds:  none  LDL 121, goal < 70 Consider statin at discharge  Dysphagia  Did not pass swallow NPO now Cortrak with tube feedings   Leukocytosis  WBC 14.3->16.5->17.4->16.6 Afebrile  Urinalysis (6/12): negative CXR (6/12): no infiltrates  Blood cultures (6/12): pending  Lethargy  Likely due to thalamic ICH and leukocytosis On provigil 100 daily Improving   Hospital day #12  Neurology will sign off. Please call with questions. Pt will follow up with stroke clinic NP at [facility] in about 4 weeks. Thanks for the consult.  Marvel Plan, MD PhD Stroke  Neurology 05/26/2021 6:58 PM    To contact Stroke Continuity provider, please refer to [email or web address]. After hours, contact General Neurology

## 2021-05-26 NOTE — Progress Notes (Signed)
Nutrition Follow-up  DOCUMENTATION CODES:   Not applicable  INTERVENTION:  Continue tube feeding via Cortrak: Osmolite 1.2 at 60 ml/h (1440 ml per day) Prosource TF 45 ml BID   Provides 1808 kcal, 101 gm protein, 1180 ml free water daily  If pt is taken off of IVF, recommend 119m free water Q4H for a total of 178103mfree water unless a fluid restriction is warranted. If a 120038mestriction is warranted, would not recommend exceeding standard 74m110mH flush for maintenance of tube patency. If 1500ml72mtriction is warranted, recommend 80ml 13m water flush QID for total of 1500ml f70mwater.   NUTRITION DIAGNOSIS:   Inadequate oral intake related to inability to eat as evidenced by NPO status.  ongoing  GOAL:   Patient will meet greater than or equal to 90% of their needs  Met with TF  MONITOR:   Diet advancement, TF tolerance  REASON FOR ASSESSMENT:   Consult Enteral/tube feeding initiation and management  ASSESSMENT:   Pt with PMH of HTN and HLD admitted with left thalamic ICH with extensive IVH secondary to hypertensive emergency.  6/3 s/p R frontal EVD placement for hydrocephalus; failed swallow, s/p cortrak placement with tip gastric   6/10 removal of EVD  Pt sleeping at time of RD visit and did not wake to RD voice/touch. Per RN, pt has been extremely lethargic for several days. Pt continues to be NPO receiving TF via Cortrak. Current regimen: Osmolite 1.2 @ 60ml/hr6mh Prosource TF BID. Note MD asked RD to reevaluate TF to assess appropriateness of rate and to provide recommendations regarding free water. MD requested RD provide recommendation only given CHF and possible fluid restriction, but IVF were initiated today due to hyponatremia. MD discussed PEG with family today. Decision pending.  Admission weight: 75.3 kg Current weight: 76.8 kg  UOP: 625ml and2munmeasured occurrences x24 hours Stool: 3x unmeasured occurrences x24 hours I/O: +5.6L since  admit  No edema noted per RN assessment  Given no edema, stable weight, and acceptable CBGs, TF regimen is likely appropriate unless signs of poor tolerance have been observed/reported, though none noted by this RD or reported to writer byProbation officer staff. Recommend continue current nutrition plan of care.   Medications: Scheduled Meds:  amLODipine  10 mg Per Tube Daily   chlorhexidine  15 mL Mouth Rinse BID   Chlorhexidine Gluconate Cloth  6 each Topical Daily   feeding supplement (PROSource TF)  45 mL Per Tube BID   heparin  5,000 Units Subcutaneous Q8H   hydrALAZINE  20 mg Per Tube Q8H   insulin aspart  0-6 Units Subcutaneous Q4H   labetalol  200 mg Per Tube TID   lisinopril  40 mg Per Tube Daily   magic mouthwash  1 mL Oral TID   mouth rinse  15 mL Mouth Rinse q12n4p   modafinil  100 mg Per Tube Daily   senna-docusate  1 tablet Per Tube BID   Continuous Infusions:  sodium chloride 100 mL/hr at 05/26/21 1140   feeding supplement (OSMOLITE 1.2 CAL) 1,000 mL (05/26/21 0526)   Labs: Recent Labs  Lab 05/21/21 0259 05/23/21 0717 05/24/21 1026 05/25/21 0350 05/26/21 0602  NA 135   < > 132* 131* 128*  K 3.8   < > 4.7 4.5 4.3  CL 97*   < > 97* 96* 95*  CO2 25   < > _0 BUN 32*   < > 33* 26* 20  CREATININE 0.78   < >  0.66 0.58 0.58  CALCIUM 8.3*   < > 8.6* 8.6* 8.5*  MG 2.6*  --   --   --   --   PHOS 4.2  --   --   --   --   GLUCOSE 158*   < > 164* 167* 128*   < > = values in this interval not displayed.  CBGs [phone]  Diet Order:   Diet Order             Diet NPO time specified  Diet effective now                   EDUCATION NEEDS:   No education needs have been identified at this time  Skin:  Skin Assessment: Reviewed RN Assessment  Last BM:  6/14 type 7  Height:   Ht Readings from Last 1 Encounters:  No data found for Ht    Weight:   Wt Readings from Last 1 Encounters:  05/26/21 76.8 kg    BMI:  There is no height or weight on file to  calculate BMI.  Estimated Nutritional Needs:   Kcal:  1800-2000  Protein:  90-105 grams  Fluid:  >1.8 L/day    Marissa Ina, MS, RD, LDN RD pager number and weekend/on-call pager number located in [place].

## 2021-05-26 NOTE — Plan of Care (Signed)
  Problem: Education: Goal: Knowledge of disease or condition will improve Outcome: Progressing Goal: Knowledge of secondary prevention will improve Outcome: Progressing Goal: Knowledge of patient specific risk factors addressed and post discharge goals established will improve Outcome: Progressing   Problem: Education: Goal: Knowledge of secondary prevention will improve Outcome: Progressing Goal: Knowledge of patient specific risk factors addressed and post discharge goals established will improve Outcome: Progressing Goal: Individualized Educational Video(s) Outcome: Progressing   Problem: Coping: Goal: Will verbalize positive feelings about self Outcome: Progressing Goal: Will identify appropriate support needs Outcome: Progressing   Problem: Health Behavior/Discharge Planning: Goal: Ability to manage health-related needs will improve Outcome: Progressing   Problem: Self-Care: Goal: Ability to participate in self-care as condition permits will improve Outcome: Progressing Goal: Verbalization of feelings and concerns over difficulty with self-care will improve Outcome: Progressing Goal: Ability to communicate needs accurately will improve Outcome: Progressing   Problem: Nutrition: Goal: Risk of aspiration will decrease Outcome: Progressing Goal: Dietary intake will improve Outcome: Progressing   Problem: Intracerebral Hemorrhage Tissue Perfusion: Goal: Complications of Intracerebral Hemorrhage will be minimized Outcome: Progressing   Problem: Safety: Goal: Non-violent Restraint(s) Outcome: Progressing   Problem: Education: Goal: Knowledge of General Education information will improve Description: Including pain rating scale, medication(s)/side effects and non-pharmacologic comfort measures Outcome: Progressing   Problem: Health Behavior/Discharge Planning: Goal: Ability to manage health-related needs will improve Outcome: Progressing   Problem: Clinical  Measurements: Goal: Ability to maintain clinical measurements within normal limits will improve Outcome: Progressing Goal: Will remain free from infection Outcome: Progressing Goal: Diagnostic test results will improve Outcome: Progressing Goal: Respiratory complications will improve Outcome: Progressing Goal: Cardiovascular complication will be avoided Outcome: Progressing

## 2021-05-26 NOTE — Progress Notes (Addendum)
PROGRESS NOTE    Marissa Morris   ZOX:[id number]  DOB: 30-Jul-1961  DOA: 05/13/2021 PCP: Pcp, No   Brief Narrative:  Sheri Mcneese this is a 60 year old female with hypertension who presented to the hospital for right-sided weakness, vomiting and a headache. In the ED: BP 222/128, CT of the head showed a right thalamic hemorrhage with intraventricular extension and mild hydrocephalus On 6/3 she underwent placement of a right frontal EVD for hydrocephalus which was removed on 6/10    Subjective: As sleepy as yesterday.    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 thought to be secondary to hypertensive emergency -With right-sided hemiplegia receptive aphasia, left gaze preference and right facial droop - Continues to be drowsy on exam-according to prior notes, she has been minimally  Verbal- she give an occasional one word answer to me-follows very limited commands and falls asleep quickly -A1c 5.7 -LDL 121 -Carotid duplex did not show any significant stenosis - last CT imaging on 6/9 reveals >"Decreasing intraventricular clot" -On Keppra twice daily for seizure prophylaxis -Cleviprex stopped 6/8- d/c'd HCTZ 6/11 which is a diuretic - control fluid balance with tube feeds - currently on hydralazine, amlodipine, labetalol, Lisinopril and as needed IV Hydralazine - Stroke team is following   Active Problems:   Encephalopathy due to hemorrhagic CVA vs underlying infection - sleepy and minimally verbal - see infection w/u below - started Modafanil 100 mg daily on 6/13 due to ongoing lethargy- discussed with Dr Roda ShuttersXu- if not effective, after 3 days-4 days, can increase to 200 mg daily  Nutrition -has a core track and is on Osmolite 60 cc an hour - will see if she awakens enough to participate with SLP over the next few days - I have notified her sister who is the POA ([employer] Gravely) along with her other sister (who is a  retired Char[employer] fundraiserN) and asked about a PEG- I have discussed complications, pros and cons of a PEG- she will speak with her sister and they will let us know  Fever and leukocytosis - Apparently she had some vaginal discharge which was sent to the lab-it was negative for chlamydia, gonorrhea, trichomonas, candida and Gardnerella -No other source for fever was found - received about 3 days of doxycycline and 4 days of Flagyl-  stopped Flagyl 6/11 - last temp was 101.3 on 6/9 - WBC up to 17 and then 16.6 - UA not consistent with UTI, CXR negative , LFTs normal - blood cultures drawn on 6/12 negative so far, Abd xray looks OK - no vaginal discharge noted at this time - I am holding off on APAP to follow fever trend  - no recurrence from 6/9 but WBC still elevated - I have asked neuro to look in on her again and to assess for a cause from their perspective   Chronic grade 2 diastolic heart failure - Continue to follow fluid status - As sodium low today, and weight has been dropping,  will start slow IVF as opposed to free water per tube - NS at 100 cc /hr for 12 hrs ordered  Hyperlipidemia - Hold off on statin for now  Glucose intolerance -A1c 5.7 - Continue checking sugars   Vaginal discharge -Resolved  - RNs checking on this  Time spent in minutes: 30 DVT prophylaxis: heparin injection 5,000 Units Start: 05/18/21 1400 SCDs Start: 05/18/21 1232 Code Status: Full code Family Communication: sister [name] (sisters have POA) Level  of Care: Level of care: Progressive Disposition Plan:  Status is: Inpatient  Remains inpatient appropriate because:Inpatient level of care appropriate due to severity of illness  Dispo: The patient is from: Home              Anticipated d/c is to: CIR              Patient currently is not medically stable to d/c.   Difficult to place patient No  Consultants:  Neurology Neurosurgery Pulmonary critical care Procedures:  EVD NG tube Antimicrobials:   Anti-infectives (From admission, onward)    Start     Dose/Rate Route Frequency Ordered Stop   05/19/21 1430  metroNIDAZOLE (FLAGYL) 50 mg/ml oral suspension 500 mg  Status:  Discontinued        500 mg Per Tube 2 times daily 05/19/21 1336 05/23/21 0804   05/19/21 1430  cefTRIAXone (ROCEPHIN) injection 500 mg        500 mg Intramuscular  Once 05/19/21 1336 05/19/21 1454   05/19/21 1430  doxycycline (VIBRA-TABS) tablet 100 mg  Status:  Discontinued        100 mg Per Tube Every 12 hours 05/19/21 1336 05/21/21 1038        Objective: Vitals:   05/26/21 0436 05/26/21 0727 05/26/21 1100 05/26/21 1132  BP:  140/79 (!) 152/79 138/83  Pulse:  64 82 79  Resp:  16  17  Temp:  99.5 F (37.5 C)  98.4 F (36.9 C)  TempSrc:  Oral  Oral  SpO2:  97%  97%  Weight: 76.8 kg      No intake or output data in the 24 hours ending 05/26/21 1153  Filed Weights   05/22/21 0400 05/24/21 0500 05/26/21 0436  Weight: 79.1 kg 76.5 kg 76.8 kg    Examination: General exam: Appears comfortable  HEENT: PERRL,  no sclera icterus or thrush- NG present Respiratory system: Clear to auscultation. Respiratory effort normal. Cardiovascular system: S1 & S2 heard, regular rate and rhythm Gastrointestinal system: Abdomen soft, non-tender, nondistended. Normal bowel sounds   Central nervous system: Alert - able to nod head in response to questions- .left side flaccid 0/5, moves right side independently but not always on command Extremities: No cyanosis, clubbing or edema Skin: No rashes or ulcers Psychiatry:  cannot assess    Data Reviewed: I have personally reviewed following labs and imaging studies  CBC: Recent Labs  Lab 05/21/21 0259 05/23/21 0717 05/24/21 0227 05/25/21 0350 05/26/21 0602  WBC 12.0* 14.3* 16.5* 17.4* 16.6*  HGB 13.8 12.9 12.9 13.6 12.7  HCT 42.3 40.0 39.8 41.3 37.7  MCV 97.0 97.6 99.0 96.7 95.7  PLT 202 246 245 263 266    Basic Metabolic Panel: Recent Labs  Lab 05/21/21 0259  05/23/21 0717 05/24/21 1026 05/25/21 0350 05/26/21 0602  NA 135 135 132* 131* 128*  K 3.8 4.3 4.7 4.5 4.3  CL 97* 100 97* 96* 95*  CO2 25 26 24 27 27   GLUCOSE 158* 184* 164* 167* 128*  BUN 32* 28* 33* 26* 20  CREATININE 0.78 0.72 0.66 0.58 0.58  CALCIUM 8.3* 8.6* 8.6* 8.6* 8.5*  MG 2.6*  --   --   --   --   PHOS 4.2  --   --   --   --     GFR: CrCl cannot be calculated (Unknown ideal weight.). Liver Function Tests: Recent Labs  Lab 05/24/21 1026  AST 32  ALT 33  ALKPHOS 62  BILITOT 0.6  PROT 6.8  ALBUMIN 3.3*    No results for input(s): LIPASE, AMYLASE in the last 168 hours. No results for input(s): AMMONIA in the last 168 hours. Coagulation Profile: No results for input(s): INR, PROTIME in the last 168 hours. Cardiac Enzymes: No results for input(s): CKTOTAL, CKMB, CKMBINDEX, TROPONINI in the last 168 hours. BNP (last 3 results) No results for input(s): PROBNP in the last 8760 hours. HbA1C: No results for input(s): HGBA1C in the last 72 hours. CBG: Recent Labs  Lab 05/25/21 2017 05/25/21 2337 05/26/21 0355 05/26/21 0820 05/26/21 1134  GLUCAP 161* 146* 163* 148* 132*    Lipid Profile: Recent Labs    05/26/21 0602  TRIG 108    Thyroid Function Tests: No results for input(s): TSH, T4TOTAL, FREET4, T3FREE, THYROIDAB in the last 72 hours. Anemia Panel: No results for input(s): VITAMINB12, FOLATE, FERRITIN, TIBC, IRON, RETICCTPCT in the last 72 hours. Urine analysis:    Component Value Date/Time   COLORURINE YELLOW 05/24/2021 0805   APPEARANCEUR CLEAR 05/24/2021 0805   LABSPEC 1.028 05/24/2021 0805   PHURINE 5.0 05/24/2021 0805   GLUCOSEU 50 (A) 05/24/2021 0805   HGBUR NEGATIVE 05/24/2021 0805   BILIRUBINUR NEGATIVE 05/24/2021 0805   KETONESUR NEGATIVE 05/24/2021 0805   PROTEINUR NEGATIVE 05/24/2021 0805   NITRITE NEGATIVE 05/24/2021 0805   LEUKOCYTESUR NEGATIVE 05/24/2021 0805   Sepsis  Labs: @LABRCNTIP (procalcitonin:4,lacticidven:4) ) Recent Results (from the past 240 hour(s))  Culture, blood (Routine X 2) w Reflex to ID Panel     Status: None (Preliminary result)   Collection Time: 05/24/21  8:56 AM   Specimen: BLOOD  Result Value Ref Range Status   Specimen Description BLOOD RIGHT ANTECUBITAL  Final   Special Requests   Final    BOTTLES DRAWN AEROBIC AND ANAEROBIC Blood Culture results may not be optimal due to an inadequate volume of blood received in culture bottles   Culture   Final    NO GROWTH 2 DAYS Performed at [facility], [address], [place], [place]    Report Status PENDING  Incomplete  Culture, blood (Routine X 2) w Reflex to ID Panel     Status: None (Preliminary result)   Collection Time: 05/24/21  8:56 AM   Specimen: BLOOD RIGHT HAND  Result Value Ref Range Status   Specimen Description BLOOD RIGHT HAND  Final   Special Requests   Final    BOTTLES DRAWN AEROBIC ONLY Blood Culture results may not be optimal due to an inadequate volume of blood received in culture bottles   Culture   Final    NO GROWTH 2 DAYS Performed at [facility], [address], [place], [place]    Report Status PENDING  Incomplete          Radiology Studies: DG Abd 1 View  Result Date: 05/25/2021 CLINICAL DATA:  Diarrhea EXAM: ABDOMEN - 1 VIEW COMPARISON:  May 15, 2021 FINDINGS: Feeding tube tip in distal stomach. Mild stool volume in the colon. There is no bowel dilatation or air-fluid level to suggest bowel obstruction. No free air. Focus of calcification in the right pelvis measuring 2 x 2 cm, a likely calcified uterine leiomyoma. IMPRESSION: Feeding tube tip in distal stomach. No bowel obstruction or free air. Mild stool volume in colon. Probable calcified uterine leiomyoma in right pelvis. Electronically Signed   By: May 17, 2021 III M.D.   On: 05/25/2021 16:27      Scheduled Meds:  amLODipine  10 mg Per Tube Daily    chlorhexidine  15 mL Mouth Rinse BID   Chlorhexidine Gluconate Cloth  6 each Topical Daily   feeding supplement (PROSource TF)  45 mL Per Tube BID   heparin  5,000 Units Subcutaneous Q8H   hydrALAZINE  20 mg Per Tube Q8H   insulin aspart  0-6 Units Subcutaneous Q4H   labetalol  200 mg Per Tube TID   lisinopril  40 mg Per Tube Daily   magic mouthwash  1 mL Oral TID   mouth rinse  15 mL Mouth Rinse q12n4p   modafinil  100 mg Per Tube Daily   senna-docusate  1 tablet Per Tube BID   Continuous Infusions:  sodium chloride 100 mL/hr at 05/26/21 1140   feeding supplement (OSMOLITE 1.2 CAL) 1,000 mL (05/26/21 0526)     LOS: 12 days      Calvert Cantor, MD [facility] Pager: [email or web address] 05/26/2021, 11:53 AM

## 2021-05-27 ENCOUNTER — Inpatient Hospital Stay (HOSPITAL_COMMUNITY): Payer: BLUE CROSS/BLUE SHIELD

## 2021-05-27 LAB — GLUCOSE, CAPILLARY
Glucose-Capillary: 109 mg/dL — ABNORMAL HIGH (ref 70–99)
Glucose-Capillary: 110 mg/dL — ABNORMAL HIGH (ref 70–99)
Glucose-Capillary: 117 mg/dL — ABNORMAL HIGH (ref 70–99)
Glucose-Capillary: 144 mg/dL — ABNORMAL HIGH (ref 70–99)
Glucose-Capillary: 99 mg/dL (ref 70–99)

## 2021-05-27 LAB — BASIC METABOLIC PANEL
Anion gap: 8 (ref 5–15)
BUN: 20 mg/dL (ref 6–20)
CO2: 28 mmol/L (ref 22–32)
Calcium: 8.6 mg/dL — ABNORMAL LOW (ref 8.9–10.3)
Chloride: 96 mmol/L — ABNORMAL LOW (ref 98–111)
Creatinine, Ser: 0.65 mg/dL (ref 0.44–1.00)
GFR, Estimated: >60 mL/min (ref >60)
Glucose, Bld: 122 mg/dL — ABNORMAL HIGH (ref 70–99)
Potassium: 4.2 mmol/L (ref 3.5–5.1)
Sodium: 132 mmol/L — ABNORMAL LOW (ref 135–145)

## 2021-05-27 MED ORDER — MODAFINIL 100 MG PO TABS
100.0000 mg | ORAL_TABLET | Freq: Every day | ORAL | Status: DC
  Administered 2021-05-28 – 2021-05-30 (×3): 100 mg via ORAL
  Filled 2021-05-27 (×3): qty 1

## 2021-05-27 MED ORDER — LABETALOL HCL 200 MG PO TABS
200.0000 mg | ORAL_TABLET | Freq: Three times a day (TID) | ORAL | Status: DC
  Administered 2021-05-28 – 2021-05-30 (×8): 200 mg via ORAL
  Filled 2021-05-27 (×10): qty 1

## 2021-05-27 MED ORDER — AMLODIPINE BESYLATE 10 MG PO TABS
10.0000 mg | ORAL_TABLET | Freq: Every day | ORAL | Status: DC
  Administered 2021-05-28 – 2021-05-30 (×3): 10 mg via ORAL
  Filled 2021-05-27 (×3): qty 1

## 2021-05-27 MED ORDER — LISINOPRIL 20 MG PO TABS
40.0000 mg | ORAL_TABLET | Freq: Every day | ORAL | Status: DC
  Administered 2021-05-28 – 2021-05-30 (×3): 40 mg via ORAL
  Filled 2021-05-27 (×3): qty 2

## 2021-05-27 MED ORDER — SENNOSIDES-DOCUSATE SODIUM 8.6-50 MG PO TABS
1.0000 | ORAL_TABLET | Freq: Two times a day (BID) | ORAL | Status: DC
  Administered 2021-05-28 – 2021-05-30 (×5): 1 via ORAL
  Filled 2021-05-27 (×6): qty 1

## 2021-05-27 MED ORDER — SODIUM CHLORIDE 0.9 % IV SOLN: INTRAVENOUS | Status: AC

## 2021-05-27 MED ORDER — HYDRALAZINE HCL 10 MG PO TABS
20.0000 mg | ORAL_TABLET | Freq: Three times a day (TID) | ORAL | Status: DC
  Administered 2021-05-27 – 2021-05-30 (×9): 20 mg via ORAL
  Filled 2021-05-27 (×10): qty 2

## 2021-05-27 NOTE — Progress Notes (Signed)
Nasogastric tube removed by RN per MD order. Pt tolerated well.

## 2021-05-27 NOTE — Progress Notes (Signed)
Modified Barium Swallow Progress Note  Patient Details  Name: Marissa Morris MRN: [id number] Date of Birth: 1961-08-29  Today's Date: 05/27/2021  Modified Barium Swallow completed.  Full report located under Chart Review in the Imaging Section.  Brief recommendations include the following:  Clinical Impression  Pt presented with resolved dysphagia and a normal oropharyngeal swallow.  There was adequate oral control and bolus manipulation/propulsion, timely swallow trigger, reliable laryngeal vestibule closure, no penetration/aspiration, good pharyngeal stripping with no residue post-swallow.  When taxed with mixed solid/thin liquid consistencies, pt continued to protect her airway.  She swallowed a 13 mm barium pill with liquid without difficulty.  There is no reason to maintain cortrak at this point.  Recommend D/C TF and initiate a regular diet with thin liquids. D/W Dr. Radonna Ricker.SLP will follow-up 1-2 more sessions to ensure safety in functional setting.    Swallow Evaluation Recommendations       SLP Diet Recommendations: Regular solids;Thin liquid   Liquid Administration via: Straw;Cup   Medication Administration: Whole meds with liquid   Supervision: Comment (assist with set-up and self feeding as needed)   Compensations: Minimize environmental distractions       Oral Care Recommendations: Oral care BID      Tyjanae Bartek L. Samson Frederic, MA CCC/SLP [facility] Services Office number [phone] Pager [phone]   Blenda Mounts Laurice 05/27/2021,9:51 AM

## 2021-05-27 NOTE — Plan of Care (Signed)
  Problem: Education: Goal: Knowledge of disease or condition will improve Outcome: Progressing Goal: Knowledge of secondary prevention will improve Outcome: Progressing Goal: Knowledge of patient specific risk factors addressed and post discharge goals established will improve Outcome: Progressing   Problem: Education: Goal: Knowledge of secondary prevention will improve Outcome: Progressing Goal: Knowledge of patient specific risk factors addressed and post discharge goals established will improve Outcome: Progressing Goal: Individualized Educational Video(s) Outcome: Progressing   Problem: Coping: Goal: Will verbalize positive feelings about self Outcome: Progressing Goal: Will identify appropriate support needs Outcome: Progressing   Problem: Health Behavior/Discharge Planning: Goal: Ability to manage health-related needs will improve Outcome: Progressing   Problem: Self-Care: Goal: Ability to participate in self-care as condition permits will improve Outcome: Progressing Goal: Verbalization of feelings and concerns over difficulty with self-care will improve Outcome: Progressing Goal: Ability to communicate needs accurately will improve Outcome: Progressing   Problem: Nutrition: Goal: Risk of aspiration will decrease Outcome: Progressing Goal: Dietary intake will improve Outcome: Progressing   Problem: Intracerebral Hemorrhage Tissue Perfusion: Goal: Complications of Intracerebral Hemorrhage will be minimized Outcome: Progressing   Problem: Safety: Goal: Non-violent Restraint(s) Outcome: Progressing   Problem: Education: Goal: Knowledge of General Education information will improve Description: Including pain rating scale, medication(s)/side effects and non-pharmacologic comfort measures Outcome: Progressing   Problem: Health Behavior/Discharge Planning: Goal: Ability to manage health-related needs will improve Outcome: Progressing   Problem: Clinical  Measurements: Goal: Ability to maintain clinical measurements within normal limits will improve Outcome: Progressing Goal: Will remain free from infection Outcome: Progressing Goal: Diagnostic test results will improve Outcome: Progressing Goal: Respiratory complications will improve Outcome: Progressing Goal: Cardiovascular complication will be avoided Outcome: Progressing   Problem: Activity: Goal: Risk for activity intolerance will decrease Outcome: Progressing   Problem: Nutrition: Goal: Adequate nutrition will be maintained Outcome: Progressing   Problem: Coping: Goal: Level of anxiety will decrease Outcome: Progressing   Problem: Elimination: Goal: Will not experience complications related to bowel motility Outcome: Progressing Goal: Will not experience complications related to urinary retention Outcome: Progressing   Problem: Pain Managment: Goal: General experience of comfort will improve Outcome: Progressing   Problem: Safety: Goal: Ability to remain free from injury will improve Outcome: Progressing   Problem: Skin Integrity: Goal: Risk for impaired skin integrity will decrease Outcome: Progressing   

## 2021-05-27 NOTE — Progress Notes (Signed)
[facility] PROGRESS NOTE    Progress Note  Marissa Morris  ZOX:[id number] DOB: [birth year] DOA: 05/13/2021 PCP: Pcp, No     Brief Narrative:   Marissa Morris is an 60 y.o. female past medical history of essential hypertension comes in with right-sided weakness vomiting and headache CT of the head showed a right thalamic hemorrhagic stroke with intraventricular extension and mild hydrocephalus, underwent EVD on 05/15/2021 to the right frontal temporal lobe for hydrocephalus and was removed on 05/22/2021   Assessment/Plan:   Hemorrhagic stroke (HCC) -left thalamic ICH with extensive IVH thought to be secondary to hypertensive emergency: Patient continues to be drowsy on exam appears to be unchanged from previous.  She can occasionally answer one-word but follows very limited commands. A1c of 5.7, LDL greater than 100. Carotid duplex showed no significant stenosis. CT of the head on 05/21/2021 showed decrease interventricular clots. She is being treated with Keppra for seizure prophylaxis. For her blood pressure she is currently on hydralazine, amlodipine, labetalol, lisinopril and hydralazine as needed. Physical therapy evaluated the patient and recommended CIR.  Hydrocephalus Repeated CT on 05/18/2021 show mild decrease in ventricular size she is status post removal of her intraventricular shunt on 05/22/2021. Repeated CT scan on 05/16/2021 showed improved IVH with stable ventricular size.  Hypertensive emergency: Off drips now on amlodipine, hydralazine, lisinopril and labetalol.  Encephalopathy due to hemorrhagic CVA versus underlying infection: He was started empirically on modafinil on 05/25/2021 due to lethargy, it was discussed with neurology if not effective can be decreased to 200 mg p.o. daily.  Nutrition: Currently with a core track being evaluated by speech, previous physician spoke to the family about PEG tube is close pros and complications and the family is currently  debating. Speech evaluated and recommended her to be n.p.o.  Fever and leukocytosis: Work-up has been negative, culture, UA, chest x-ray abdominal x-ray. Antibiotics were discontinued on 05/21/2021, since then she has remained afebrile. T-max of 99.4 mild leukocytosis but is slowly improving off antibiotics.  Likely stress margination.  Chronic grade 2 diastolic heart failure: Appears to be on the dry side she was started on IV fluids.  Hypovolemic hyponatremia: Free water flushes were stopped she was started on normal saline and her sodium is slowly improving. Will continue IV fluids for 1 additional day.  Hyperlipidemia: Holding statin for now.  Hyperglycemia, With an A1c of 5.7 will need to f be reevaluated in the future.  Vaginal discharge: Now resolved.   DVT prophylaxis: SCD Family Communication:no at bedside Status is: Inpatient  Remains inpatient appropriate because:Hemodynamically unstable  Dispo: The patient is from: Home              Anticipated d/c is to: SNF              Patient currently is not medically stable to d/c.   Difficult to place patient No        Code Status:     Code Status Orders  (From admission, onward)           Start     Ordered   05/14/21 0018  Full code  Continuous        05/14/21 0025           Code Status History     This patient has a current code status but no historical code status.         IV Access:   Peripheral IV   Procedures and diagnostic studies:   DG Abd  1 View  Result Date: 05/25/2021 CLINICAL DATA:  Diarrhea EXAM: ABDOMEN - 1 VIEW COMPARISON:  May 15, 2021 FINDINGS: Feeding tube tip in distal stomach. Mild stool volume in the colon. There is no bowel dilatation or air-fluid level to suggest bowel obstruction. No free air. Focus of calcification in the right pelvis measuring 2 x 2 cm, a likely calcified uterine leiomyoma. IMPRESSION: Feeding tube tip in distal stomach. No bowel obstruction or  free air. Mild stool volume in colon. Probable calcified uterine leiomyoma in right pelvis. Electronically Signed   By: Bretta BangWilliam  Woodruff III M.D.   On: 05/25/2021 16:27   CT HEAD WO CONTRAST  Result Date: 05/26/2021 CLINICAL DATA:  Intracranial hemorrhage, follow-up EXAM: CT HEAD WITHOUT CONTRAST TECHNIQUE: Contiguous axial images were obtained from the base of the skull through the vertex without intravenous contrast. COMPARISON:  05/21/2021 FINDINGS: Brain: Interval removal of right frontal approach ventricular catheter. There is minimal postprocedural air in the left lateral ventricle. Intraventricular hemorrhage has significantly decreased. Ventricle caliber is similar. Evolving hemorrhage centered within the left thalamus as again identified with similar surrounding edema. Similar mass effect including effacement of the third ventricle and adjacent left lateral ventricle. Stable findings of chronic microvascular ischemic changes. 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 Vascular: No new finding. Skull: Calvarium is unremarkable apart from small right frontal burr hole. Sinuses/Orbits: No acute finding. Other: None. IMPRESSION: Interval removal of ventricular catheter. Significantly decreased intraventricular hemorrhage. Ventricle caliber is similar. Evolving left thalamic hemorrhage and edema. No new hemorrhage or worsening of mass effect. Electronically Signed   By: Guadlupe SpanishPraneil  Patel M.D.   On: 05/26/2021 12:50     Medical Consultants:   None.   Subjective:    [name] no complaints feels great.  Objective:    Vitals:   05/26/21 1931 05/26/21 2332 05/27/21 0332 05/27/21 0500  BP: 134/70 133/66 128/63   Pulse: 78 70 75   Resp: 18 16 18    Temp: 99.3 F (37.4 C) 99.1 F (37.3 C) 99 F (37.2 C)   TempSrc: Oral Axillary Axillary   SpO2: 97% 99% 100%   Weight:    75.9 kg   SpO2: 100 % O2 Flow Rate (L/min): 2 L/min   Intake/Output Summary (Last 24  hours) at 05/27/2021 0741 Last data filed at 05/27/2021 0349 Gross per 24 hour  Intake 0 ml  Output 1475 ml  Net -1475 ml   Filed Weights   05/24/21 0500 05/26/21 0436 05/27/21 0500  Weight: 76.5 kg 76.8 kg 75.9 kg    Exam: General exam: In no acute distress. Respiratory system: Good air movement and clear to auscultation. Cardiovascular system: S1 & S2 heard, RRR. No JVD. Gastrointestinal system: Abdomen is nondistended, soft and nontender.  Extremities: No pedal edema. Skin: No rashes, lesions or ulcers  Data Reviewed:    Labs: Basic Metabolic Panel: Recent Labs  Lab 05/21/21 0259 05/23/21 0717 05/24/21 1026 05/25/21 0350 05/26/21 0602 05/27/21 0456  NA 135 135 132* 131* 128* 132*  K 3.8 4.3 4.7 4.5 4.3 4.2  CL 97* 100 97* 96* 95* 96*  CO2 25 26 24 27 27 28   GLUCOSE 158* 184* 164* 167* 128* 122*  BUN 32* 28* 33* 26* 20 20  CREATININE 0.78 0.72 0.66 0.58 0.58 0.65  CALCIUM 8.3* 8.6* 8.6* 8.6* 8.5* 8.6*  MG 2.6*  --   --   --   --   --   PHOS 4.2  --   --   --   --   --  GFR CrCl cannot be calculated (Unknown ideal weight.). Liver Function Tests: Recent Labs  Lab 05/24/21 1026  AST 32  ALT 33  ALKPHOS 62  BILITOT 0.6  PROT 6.8  ALBUMIN 3.3*   No results for input(s): LIPASE, AMYLASE in the last 168 hours. No results for input(s): AMMONIA in the last 168 hours. Coagulation profile No results for input(s): INR, PROTIME in the last 168 hours. COVID-19 Labs  No results for input(s): DDIMER, FERRITIN, LDH, CRP in the last 72 hours.  Lab Results  Component Value Date   SARSCOV2NAA NEGATIVE 05/13/2021    CBC: Recent Labs  Lab 05/21/21 0259 05/23/21 0717 05/24/21 0227 05/25/21 0350 05/26/21 0602  WBC 12.0* 14.3* 16.5* 17.4* 16.6*  HGB 13.8 12.9 12.9 13.6 12.7  HCT 42.3 40.0 39.8 41.3 37.7  MCV 97.0 97.6 99.0 96.7 95.7  PLT 202 246 245 263 266   Cardiac Enzymes: No results for input(s): CKTOTAL, CKMB, CKMBINDEX, TROPONINI in the last 168  hours. BNP (last 3 results) No results for input(s): PROBNP in the last 8760 hours. CBG: Recent Labs  Lab 05/26/21 1134 05/26/21 1735 05/26/21 2009 05/26/21 2347 05/27/21 0346  GLUCAP 132* 137* 155* 124* 117*   D-Dimer: No results for input(s): DDIMER in the last 72 hours. Hgb A1c: No results for input(s): HGBA1C in the last 72 hours. Lipid Profile: Recent Labs    05/26/21 0602  TRIG 108   Thyroid function studies: No results for input(s): TSH, T4TOTAL, T3FREE, THYROIDAB in the last 72 hours.  Invalid input(s): FREET3 Anemia work up: No results for input(s): VITAMINB12, FOLATE, FERRITIN, TIBC, IRON, RETICCTPCT in the last 72 hours. Sepsis Labs: Recent Labs  Lab 05/23/21 0717 05/24/21 0227 05/25/21 0350 05/26/21 0602  WBC 14.3* 16.5* 17.4* 16.6*   Microbiology Recent Results (from the past 240 hour(s))  Culture, blood (Routine X 2) w Reflex to ID Panel     Status: None (Preliminary result)   Collection Time: 05/24/21  8:56 AM   Specimen: BLOOD  Result Value Ref Range Status   Specimen Description BLOOD RIGHT ANTECUBITAL  Final   Special Requests   Final    BOTTLES DRAWN AEROBIC AND ANAEROBIC Blood Culture results may not be optimal due to an inadequate volume of blood received in culture bottles   Culture   Final    NO GROWTH 3 DAYS Performed at [facility], [address], [place], [place] [address]    Report Status PENDING  Incomplete  Culture, blood (Routine X 2) w Reflex to ID Panel     Status: None (Preliminary result)   Collection Time: 05/24/21  8:56 AM   Specimen: BLOOD RIGHT HAND  Result Value Ref Range Status   Specimen Description BLOOD RIGHT HAND  Final   Special Requests   Final    BOTTLES DRAWN AEROBIC ONLY Blood Culture results may not be optimal due to an inadequate volume of blood received in culture bottles   Culture   Final    NO GROWTH 3 DAYS Performed at [facility], [address], [place], [place] [address]    Report  Status PENDING  Incomplete     Medications:    amLODipine  10 mg Per Tube Daily   chlorhexidine  15 mL Mouth Rinse BID   Chlorhexidine Gluconate Cloth  6 each Topical Daily   feeding supplement (PROSource TF)  45 mL Per Tube BID   heparin  5,000 Units Subcutaneous Q8H   hydrALAZINE  20  mg Per Tube Q8H   insulin aspart  0-6 Units Subcutaneous Q4H   labetalol  200 mg Per Tube TID   lisinopril  40 mg Per Tube Daily   magic mouthwash  1 mL Oral TID   mouth rinse  15 mL Mouth Rinse q12n4p   modafinil  100 mg Per Tube Daily   senna-docusate  1 tablet Per Tube BID   Continuous Infusions:  sodium chloride 100 mL/hr at 05/27/21 0019   feeding supplement (OSMOLITE 1.2 CAL) 1,000 mL (05/27/21 0130)      LOS: 13 days   Marinda Elk  [facility]  05/27/2021, 7:41 AM

## 2021-05-27 NOTE — Progress Notes (Signed)
Pt refused labetalol 300 mg, BP 115/54 HR 75, MD notified. Will continue to monitor.

## 2021-05-27 NOTE — Progress Notes (Signed)
Physical Therapy Treatment Patient Details Name: Julene Rahn MRN: [id number] DOB: January 21, 1961 Today's Date: 05/27/2021    History of Present Illness Nakeita Winstead is a 60 y.o. female presenting to [facility] in transfer from [facility] for management of acute intracranial hemorrhage. 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 6/3 interval placement of R frontal EVD; removed 6/10.  modafinil on 05/25/2021 No significant PMH.    PT Comments    Patient with increased arousal this session. Patient progressing towards physical therapy goals. Patient performed bed mobility with mod-maxA+2 and sit to stand with maxA+2. Patient required min-maxA to maintain sitting balance. Updated recommendation to CIR as patient with improved progress and alertness this session. Patient would benefit from intensive therapies to maximize functional mobility.     Follow Up Recommendations  CIR     Equipment Recommendations  3in1 (PT);Wheelchair (measurements PT);Wheelchair cushion (measurements PT)    Recommendations for Other Services       Precautions / Restrictions Precautions Precautions: Fall Restrictions Weight Bearing Restrictions: No    Mobility  Bed Mobility Overal bed mobility: Needs Assistance Bed Mobility: Rolling;Sidelying to Sit;Supine to Sit;Sit to Supine Rolling: +2 for physical assistance;Mod assist Sidelying to sit: +2 for physical assistance;Max assist Supine to sit: +2 for physical assistance;Max assist Sit to supine: +2 for physical assistance;Mod assist   General bed mobility comments: pt initiates by reaching with L UE toward the L on command. pt needs hand over hand to turn R and then only after elevated past midline does pt engage to reach with L UE for bed rail. pt does not sustain grasp to Bed rail on L side. pt needs total to min (A) sitting during session. pt initiates return to supien total +2 max (A) due to control descend and (A) R  LE    Transfers Overall transfer level: Needs assistance Equipment used: 2 person hand held assist Transfers: Sit to/from Stand Sit to Stand: Max assist;+2 physical assistance;+2 safety/equipment;From elevated surface         General transfer comment: blocking LLE and cues for neck extension. pt with weight shift controlled by therapist to help fire R LE  Ambulation/Gait             General Gait Details: unable   Stairs             Wheelchair Mobility    Modified Rankin (Stroke Patients Only) Modified Rankin (Stroke Patients Only) Pre-Morbid Rankin Score: No symptoms Modified Rankin: Severe disability     Balance Overall balance assessment: Needs assistance Sitting-balance support: Single extremity supported;Feet supported Sitting balance-Leahy Scale: Zero     Standing balance support: Single extremity supported;During functional activity Standing balance-Leahy Scale: Zero Standing balance comment: blocking RLE and hyperextension against bed surface                            Cognition Arousal/Alertness: Awake/alert Behavior During Therapy: WFL for tasks assessed/performed Overall Cognitive Status: Impaired/Different from baseline Area of Impairment: Orientation;Attention;Memory;Following commands;Safety/judgement;Awareness;Problem solving                 Orientation Level: Disoriented to;Place;Time;Situation Current Attention Level: Sustained Memory: Decreased recall of precautions;Decreased short-term memory Following Commands: Follows one step commands inconsistently;Follows one step commands with increased time Safety/Judgement: Decreased awareness of safety;Decreased awareness of deficits Awareness: Intellectual Problem Solving: Slow processing;Difficulty sequencing;Requires verbal cues;Requires tactile cues General Comments: pt following 1 step commands 50% of session with delays to  respond. pt needs time to terminate  previous  command. pt preseverates and pt also very easily distracted. Pt report diplopia during session. pt decrease awareness to R side of body but with visual attention able to initiate. pt states "old" when asked how does R UE feel when touching with LUE      Exercises Other Exercises Other Exercises: weight bearing R UE , activation of L UE to help engage R UE recruitment    General Comments General comments (skin integrity, edema, etc.): VSS BP stable      Pertinent Vitals/Pain Pain Assessment: No/denies pain    Home Living                      Prior Function            PT Goals (current goals can now be found in the care plan section) Acute Rehab PT Goals Patient Stated Goal: to eat cookie PT Goal Formulation: With patient/family Time For Goal Achievement: 05/30/21 Potential to Achieve Goals: Good Progress towards PT goals: Progressing toward goals    Frequency    Min 4X/week      PT Plan Discharge plan needs to be updated;Frequency needs to be updated    Co-evaluation PT/OT/SLP Co-Evaluation/Treatment: Yes Reason for Co-Treatment: Complexity of the patient's impairments (multi-system involvement);Necessary to address cognition/behavior during functional activity;For patient/therapist safety;To address functional/ADL transfers PT goals addressed during session: Mobility/safety with mobility;Balance OT goals addressed during session: ADL's and self-care;Proper use of Adaptive equipment and DME;Strengthening/ROM      AM-PAC PT "6 Clicks" Mobility   Outcome Measure  Help needed turning from your back to your side while in a flat bed without using bedrails?: A Little Help needed moving from lying on your back to sitting on the side of a flat bed without using bedrails?: Total Help needed moving to and from a bed to a chair (including a wheelchair)?: Total Help needed standing up from a chair using your arms (e.g., wheelchair or bedside chair)?: Total Help needed  to walk in hospital room?: Total Help needed climbing 3-5 steps with a railing? : Total 6 Click Score: 8    End of Session Equipment Utilized During Treatment: Gait belt Activity Tolerance: Patient tolerated treatment well Patient left: with call bell/phone within reach;in bed;with bed alarm set Nurse Communication: Mobility status PT Visit Diagnosis: Unsteadiness on feet (R26.81);Hemiplegia and hemiparesis;Muscle weakness (generalized) (M62.81);Difficulty in walking, not elsewhere classified (R26.2);Other symptoms and signs involving the nervous system (R29.898) Hemiplegia - Right/Left: Right Hemiplegia - dominant/non-dominant: Dominant Hemiplegia - caused by: Other Nontraumatic intracranial hemorrhage     Time: 1916-6060 PT Time Calculation (min) (ACUTE ONLY): 24 min  Charges:  $Therapeutic Activity: 8-22 mins                     Newton Frutiger A. Dan Humphreys PT, DPT Acute Rehabilitation Services Pager [phone] Office [phone]    Viviann Spare 05/27/2021, 3:57 PM

## 2021-05-27 NOTE — Progress Notes (Signed)
Occupational Therapy Treatment Patient Details Name: Marissa Morris MRN: [id number] DOB: 10/27/1961 Today's Date: 05/27/2021    History of present illness Marissa Morris is a 60 y.o. female presenting to [facility] in transfer from [facility] for management of acute intracranial hemorrhage. 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 6/3 interval placement of R frontal EVD; removed 6/10.  modafinil on 05/25/2021 No significant PMH.   OT comments  Pt demonstrates increased arousal and changed d/c to CIR at this time due to improved progress noted below. Pt eating cooking and drinking from cup this session in sitting position. Pt laughing and engaged in session. Pt placed in chair position in bed with bed alarm set.     Follow Up Recommendations  CIR    Equipment Recommendations  3 in 1 bedside commode    Recommendations for Other Services Rehab consult    Precautions / Restrictions Precautions Precautions: Fall       Mobility Bed Mobility Overal bed mobility: Needs Assistance Bed Mobility: Rolling;Sidelying to Sit;Supine to Sit;Sit to Supine Rolling: +2 for physical assistance;Mod assist Sidelying to sit: +2 for physical assistance;Max assist Supine to sit: +2 for physical assistance;Max assist Sit to supine: +2 for physical assistance;Mod assist   General bed mobility comments: pt initiates by reaching with L UE toward the L on command. pt needs hand over hand to turn R and then only after elevated past midline does pt engage to reach with L UE for bed rail. pt does not sustain grasp to Bed rail on L side. pt needs total to min (A) sitting during session. pt initiates return to supien total +2 max (A) due to control descend and (A) R LE    Transfers Overall transfer level: Needs assistance Equipment used: 2 person hand held assist Transfers: Sit to/from Stand Sit to Stand: +2 physical assistance;Max assist;+2 safety/equipment;From elevated  surface         General transfer comment: blocking LLE and cues for neck extension. pt with weight shift controlled by therapist to help fire R LE    Balance Overall balance assessment: Needs assistance Sitting-balance support: Single extremity supported;Feet supported Sitting balance-Leahy Scale: Zero     Standing balance support: Single extremity supported;During functional activity Standing balance-Leahy Scale: Zero Standing balance comment: blocking RLE and hyperextension against bed surface                           ADL either performed or assessed with clinical judgement   ADL Overall ADL's : Needs assistance/impaired Eating/Feeding: Minimal assistance;Bed level Eating/Feeding Details (indicate cue type and reason): needs food in L visual field Grooming: Wash/dry face;Minimal assistance Grooming Details (indicate cue type and reason): needs upright postuer and able to wipe face             Lower Body Dressing: Maximal assistance;Sitting/lateral leans Lower Body Dressing Details (indicate cue type and reason): pt needs max cues to visual attend to feet. pt does not recognize a sock as a sock when asked. Pt with better (A) don sock with R LE being held in figure 4 due to L visual field               General ADL Comments: at the end of session pt indep turned head toward the R visual field and made eye contact to say good bye to therapists     Vision   Vision Assessment?: Yes Diplopia Assessment: Objects split side  to side Additional Comments: pt with L gaze preference. pt can scan past midline. pt can extend neck to neutral with only verbal cues   Perception     Praxis      Cognition Arousal/Alertness: Awake/alert Behavior During Therapy: WFL for tasks assessed/performed Overall Cognitive Status: Impaired/Different from baseline Area of Impairment: Orientation;Attention;Memory;Following commands;Safety/judgement;Awareness;Problem solving                  Orientation Level: Disoriented to;Place;Time;Situation Current Attention Level: Sustained Memory: Decreased recall of precautions;Decreased short-term memory Following Commands: Follows one step commands inconsistently;Follows one step commands with increased time Safety/Judgement: Decreased awareness of safety;Decreased awareness of deficits Awareness: Intellectual Problem Solving: Slow processing;Difficulty sequencing;Requires verbal cues;Requires tactile cues General Comments: pt following 1 step commands 50% of session with delays to respond. pt needs time to terminate  previous command. pt preseverates and pt also very easily distracted. Pt report diplopia during session. pt decrease awareness to R side of body but with visual attention able to initiate. pt states "old" when asked how does R UE feel when touching with LUE        Exercises Exercises: Other exercises Other Exercises Other Exercises: weight bearing R UE , activation of L UE to help engage R UE recruitment   Shoulder Instructions       General Comments VSS BP stable    Pertinent Vitals/ Pain       Pain Assessment: No/denies pain  Home Living                                          Prior Functioning/Environment              Frequency  Min 2X/week        Progress Toward Goals  OT Goals(current goals can now be found in the care plan section)  Progress towards OT goals: Progressing toward goals  Acute Rehab OT Goals Patient Stated Goal: to eat cookie OT Goal Formulation: Patient unable to participate in goal setting Time For Goal Achievement: 05/30/21 Potential to Achieve Goals: Fair ADL Goals Pt Will Perform Grooming: sitting;with min guard assist Pt Will Perform Lower Body Dressing: with mod assist;sitting/lateral leans;sit to/from stand Pt Will Transfer to Toilet: with min assist;with +2 assist;stand pivot transfer;bedside commode Pt/caregiver will Perform  Home Exercise Program: Increased strength;Right Upper extremity;Increased ROM;With minimal assist;With written HEP provided Additional ADL Goal #1: Pt will follow 1-2 step commands with minimal cues for arousal in 3/5 trials in non distracting environment. Additional ADL Goal #2: Pt will utilize 3 ADL/iADL items properly with minimal cues to stay on task in 2/3 trials.  Plan Discharge plan needs to be updated    Co-evaluation    PT/OT/SLP Co-Evaluation/Treatment: Yes Reason for Co-Treatment: Complexity of the patient's impairments (multi-system involvement);Necessary to address cognition/behavior during functional activity;For patient/therapist safety   OT goals addressed during session: ADL's and self-care;Proper use of Adaptive equipment and DME;Strengthening/ROM      AM-PAC OT "6 Clicks" Daily Activity     Outcome Measure   Help from another person eating meals?: A Little Help from another person taking care of personal grooming?: A Little Help from another person toileting, which includes using toliet, bedpan, or urinal?: A Lot Help from another person bathing (including washing, rinsing, drying)?: A Lot Help from another person to put on and taking off regular upper body clothing?: A Lot Help  from another person to put on and taking off regular lower body clothing?: A Lot 6 Click Score: 14    End of Session Equipment Utilized During Treatment: Gait belt  OT Visit Diagnosis: Unsteadiness on feet (R26.81);Muscle weakness (generalized) (M62.81);Other symptoms and signs involving cognitive function;Cognitive communication deficit (R41.841);Hemiplegia and hemiparesis;Other abnormalities of gait and mobility (R26.89);Low vision, both eyes (H54.2);Feeding difficulties (R63.3);Other symptoms and signs involving the nervous system (R29.898) Symptoms and signs involving cognitive functions: Other Nontraumatic ICH Hemiplegia - Right/Left: Right Hemiplegia - dominant/non-dominant:  Dominant Hemiplegia - caused by: Other Nontraumatic intracranial hemorrhage   Activity Tolerance Patient tolerated treatment well   Patient Left in bed;with call bell/phone within reach;with bed alarm set   Nurse Communication Mobility status;Precautions        Time: 5784-6962 OT Time Calculation (min): 25 min  Charges: OT General Charges $OT Visit: 1 Visit OT Treatments $Self Care/Home Management : 8-22 mins   Brynn, OTR/L  Acute Rehabilitation Services Pager: [phone] Office: [phone] .    Mateo Flow 05/27/2021, 3:51 PM

## 2021-05-28 LAB — BASIC METABOLIC PANEL
Anion gap: 8 (ref 5–15)
BUN: 15 mg/dL (ref 6–20)
CO2: 26 mmol/L (ref 22–32)
Calcium: 8.6 mg/dL — ABNORMAL LOW (ref 8.9–10.3)
Chloride: 101 mmol/L (ref 98–111)
Creatinine, Ser: 0.63 mg/dL (ref 0.44–1.00)
GFR, Estimated: >60 mL/min (ref >60)
Glucose, Bld: 100 mg/dL — ABNORMAL HIGH (ref 70–99)
Potassium: 4.1 mmol/L (ref 3.5–5.1)
Sodium: 135 mmol/L (ref 135–145)

## 2021-05-28 LAB — GLUCOSE, CAPILLARY
Glucose-Capillary: 96 mg/dL (ref 70–99)
Glucose-Capillary: 95 mg/dL (ref 70–99)

## 2021-05-28 MED ORDER — MODAFINIL 100 MG PO TABS: 100.0000 mg | ORAL_TABLET | Freq: Every day | ORAL | Status: DC

## 2021-05-28 MED ORDER — LABETALOL HCL 200 MG PO TABS: 200.0000 mg | ORAL_TABLET | Freq: Two times a day (BID) | ORAL | Status: DC

## 2021-05-28 MED ORDER — HYDRALAZINE HCL 10 MG PO TABS: 20.0000 mg | ORAL_TABLET | Freq: Three times a day (TID) | ORAL | Status: DC

## 2021-05-28 MED ORDER — LISINOPRIL 20 MG PO TABS: 40.0000 mg | ORAL_TABLET | Freq: Every day | ORAL | Status: DC

## 2021-05-28 MED ORDER — AMLODIPINE BESYLATE 10 MG PO TABS: 10.0000 mg | ORAL_TABLET | Freq: Every day | ORAL | Status: DC

## 2021-05-28 NOTE — Progress Notes (Addendum)
Occupational Therapy Treatment Patient Details Name: Marissa Morris MRN: [id number] DOB: [birth year] Today's Date: 05/28/2021    History of present illness 60 y.o. female presenting to [facility] in transfer from [facility] for management of acute intracranial hemorrhage. CT revealed a 3 cm thalamic hemorrahge with intraventricular extension. 6/3 interval placement of R frontal EVD; removed 6/10.  modafinil on 05/25/2021 No significant PMH.   OT comments  Pt progressing towards established OT goals. Continues to present with decreased balance, strength, vision, cognition, and functional use of R side. Upon arrival, pt's bed soiled with urine (suction for purewick not on). Pt requiring Max-Total A for bathing and Max A for donning new gown. Pt tolerating sitting at EOB with assistance for balance; participating in L lateral lean exercises for correcting balance. Pt performing sit<>stand with stedy requiring Max a +2 and then transitioning to sink for seated grooming task. Requiring Mod A for sitting balance at sink and Max cues for sequencing task. Due to pt's age, change in function, and motivation despite fatigue, recommend dc to CIR for intensive OT and will continue to follow acutely as admitted.   Follow Up Recommendations  CIR    Equipment Recommendations  3 in 1 bedside commode    Recommendations for Other Services Rehab consult    Precautions / Restrictions Precautions Precautions: Fall Precaution Comments: coretrack       Mobility Bed Mobility Overal bed mobility: Needs Assistance Bed Mobility: Rolling;Sidelying to Sit;Sit to Supine Rolling: Mod assist Sidelying to sit: Max assist   Sit to supine: Max assist;+2 for physical assistance   General bed mobility comments: Max A to bring BLEs over EOB and elevate trunk. Pt pushing back into sidelying to R and requiring cues for maintaining midline. Max A +2 for returning to bed and supine.    Transfers Overall  transfer level: Needs assistance Equipment used: 2 person hand held assist Transfers: Sit to/from Stand Sit to Stand: Max assist;+2 physical assistance;+2 safety/equipment;From elevated surface         General transfer comment: Max A +2 to power up into standing    Balance Overall balance assessment: Needs assistance Sitting-balance support: Single extremity supported;Feet supported Sitting balance-Leahy Scale: Poor Sitting balance - Comments: Pushing to R. Able to attain midline for short periods with Max cues and lateral leaning to L. Postural control: Right lateral lean Standing balance support: Single extremity supported;During functional activity Standing balance-Leahy Scale: Poor Standing balance comment: Physical A for maintaining sitting balance                           ADL either performed or assessed with clinical judgement   ADL Overall ADL's : Needs assistance/impaired     Grooming: Wash/dry face;Sitting Grooming Details (indicate cue type and reason): While sitting in stedy at sink, pt washing her face with step by step simple cues (in quiet environment) and Mod A for sitting balance and support at RUE. Upper Body Bathing: Maximal assistance;Bed level;Sitting   Lower Body Bathing: Total assistance   Upper Body Dressing : Maximal assistance;Bed level Upper Body Dressing Details (indicate cue type and reason): Max A to don new gown; Max cues for engaging RUE into task. Poor attention to R     Toilet Transfer: Maximal assistance;+2 for physical assistance (sit<>stand with stedy) Toilet Transfer Details (indicate cue type and reason): Max A +2 for sit<>stand with stedy         Functional mobility during ADLs:  Maximal assistance;+2 for physical assistance (sit<>stand with stedy) General ADL Comments: Upon arrival, pt soiled with urine in bed. Requring Max A for bathing and donning new gown. Use of stedy to perform sit<>stand and transition to sink for  washing her face     Vision   Vision Assessment?: Yes Eye Alignment: Impaired (comment) Ocular Range of Motion: Restricted on the left;Impaired-to be further tested in functional context Alignment/Gaze Preference: Gaze left;Head turned Tracking/Visual Pursuits: Impaired - to be further tested in functional context Diplopia Assessment: Objects split side to side Additional Comments: Will continue to assess. Continues to present with decreased attention to R and poor tracking to R   Perception     Praxis      Cognition Arousal/Alertness: Awake/alert Behavior During Therapy: WFL for tasks assessed/performed Overall Cognitive Status: Impaired/Different from baseline Area of Impairment: Orientation;Attention;Memory;Following commands;Safety/judgement;Awareness;Problem solving                 Orientation Level: Disoriented to;Place;Time;Situation Current Attention Level: Sustained Memory: Decreased recall of precautions;Decreased short-term memory Following Commands: Follows one step commands inconsistently;Follows one step commands with increased time Safety/Judgement: Decreased awareness of safety;Decreased awareness of deficits Awareness: Intellectual Problem Solving: Slow processing;Difficulty sequencing;Requires verbal cues;Requires tactile cues General Comments: pt following 1 step commands 50% of session with delays to respond; requiring increased time for processing. No verbalization this session.Poor awareness and attention to R side of body or environment. Pt leaning her head against her hands when fatigued.        Exercises Exercises: Other exercises Other Exercises Other Exercises: Weight bearing onto LUE and then correcting posture to midline; x3. sitting at EOB. Max cues   Shoulder Instructions       General Comments VSS on RA.    Pertinent Vitals/ Pain       Pain Assessment: No/denies pain Faces Pain Scale: No hurt  Home Living                                           Prior Functioning/Environment              Frequency  Min 2X/week        Progress Toward Goals  OT Goals(current goals can now be found in the care plan section)  Progress towards OT goals: Progressing toward goals  Acute Rehab OT Goals Patient Stated Goal: to eat cookie OT Goal Formulation: Patient unable to participate in goal setting Time For Goal Achievement: 05/30/21 Potential to Achieve Goals: Fair ADL Goals Pt Will Perform Grooming: sitting;with min guard assist Pt Will Perform Lower Body Dressing: with mod assist;sitting/lateral leans;sit to/from stand Pt Will Transfer to Toilet: with min assist;with +2 assist;stand pivot transfer;bedside commode Pt/caregiver will Perform Home Exercise Program: Increased strength;Right Upper extremity;Increased ROM;With minimal assist;With written HEP provided Additional ADL Goal #1: Pt will follow 1-2 step commands with minimal cues for arousal in 3/5 trials in non distracting environment. Additional ADL Goal #2: Pt will utilize 3 ADL/iADL items properly with minimal cues to stay on task in 2/3 trials.  Plan Discharge plan remains appropriate    Co-evaluation    PT/OT/SLP Co-Evaluation/Treatment: Yes Reason for Co-Treatment: For patient/therapist safety;To address functional/ADL transfers   OT goals addressed during session: ADL's and self-care      AM-PAC OT "6 Clicks" Daily Activity     Outcome Measure   Help from another person eating meals?:  A Little Help from another person taking care of personal grooming?: A Little Help from another person toileting, which includes using toliet, bedpan, or urinal?: A Lot Help from another person bathing (including washing, rinsing, drying)?: A Lot Help from another person to put on and taking off regular upper body clothing?: A Lot Help from another person to put on and taking off regular lower body clothing?: A Lot 6 Click Score: 14    End of  Session Equipment Utilized During Treatment: Gait belt  OT Visit Diagnosis: Unsteadiness on feet (R26.81);Muscle weakness (generalized) (M62.81);Other symptoms and signs involving cognitive function;Cognitive communication deficit (R41.841);Hemiplegia and hemiparesis;Other abnormalities of gait and mobility (R26.89);Low vision, both eyes (H54.2);Feeding difficulties (R63.3);Other symptoms and signs involving the nervous system (R29.898) Symptoms and signs involving cognitive functions: Other Nontraumatic ICH Hemiplegia - Right/Left: Right Hemiplegia - dominant/non-dominant: Dominant Hemiplegia - caused by: Other Nontraumatic intracranial hemorrhage   Activity Tolerance Patient tolerated treatment well   Patient Left in bed;with call bell/phone within reach;with bed alarm set   Nurse Communication Mobility status;Precautions        Time: 7169-6789 OT Time Calculation (min): 28 min  Charges: OT General Charges $OT Visit: 1 Visit OT Treatments $Self Care/Home Management : 8-22 mins  Chrissie Dacquisto MSOT, OTR/L Acute Rehab Pager: [phone] Office: [phone]   Theodoro Grist Macrae Wiegman 05/28/2021, 4:27 PM

## 2021-05-28 NOTE — Progress Notes (Signed)
IP rehab admissions - I spoke with sister who would like inpatient rehab here at [facility]. However, we called [employer] [place] and she has no out of state coverage for rehab here in [place].  She has coverage for emergency out of state benefits only.  I will call and refer her back to case manager and SW for placement in [place].  Call for questions.  [phone]

## 2021-05-28 NOTE — Progress Notes (Signed)
[facility] PROGRESS NOTE    Progress Note  Oney Virgil  WUX:[id number] DOB: 1961/06/18 DOA: 05/13/2021 PCP: Pcp, No     Brief Narrative:   Daishia York is an 60 y.o. 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  Assessment/Plan:   Hemorrhagic stroke (HCC) -left thalamic ICH with extensive IVH thought to be secondary to hypertensive emergency: Patient continues to be drowsy on exam appears to be unchanged from previous.   Significantly more awake this morning tolerating her breakfast. Repeated CT of the head on 05/21/2021 showed decrease interventricular clots. Continue Keppra for seizure prophylaxis. Blood pressure relatively stable on hydralazine, amlodipine labetalol lisinopril, she refused her labetalol yesterday. Physical therapy evaluated the patient and recommended CIR.  Hydrocephalus Repeated CT on 05/18/2021 show mild decrease in ventricular size she is status post removal of her intraventricular shunt on 05/22/2021. Repeated CT scan on 05/16/2021 showed improved IVH with stable ventricular size.  Hypertensive emergency: Off drips now on amlodipine, hydralazine, lisinopril and labetalol. Blood pressure relatively well controlled ranging from 120-145/75  Encephalopathy due to hemorrhagic CVA versus underlying infection: He was started empirically on modafinil on 05/25/2021 due to lethargy, it was discussed with neurology if not effective can be decreased to 200 mg p.o. daily.  Nutrition: Core track discontinued, she is currently on a regular diet. No signs of aspiration tolerating it well.  Fever and leukocytosis: Work-up has been negative, culture, UA, chest  x-ray abdominal x-ray. Antibiotics were discontinued on 05/21/2021, since then she has remained afebrile. T-max of 99.4 mild leukocytosis but is slowly improving off antibiotics.  Likely stress margination.  Chronic grade 2 diastolic heart failure: Appears to be on the dry side she was started on IV fluids.  Hypovolemic hyponatremia: Free water flushes were stopped she was started on normal saline and her sodium is slowly improving. Will continue IV fluids for 1 additional day.  Hyperlipidemia: Holding statin for now.  Hyperglycemia, With an A1c of 5.7 will need to f be reevaluated in the future.  Vaginal discharge: Now resolved.   DVT prophylaxis: SCD Family Communication:no at bedside Status is: Inpatient  Remains inpatient appropriate because:Hemodynamically unstable  Dispo: The patient is from: Home              Anticipated d/c is to: Awaiting CIR placement.              Patient currently is not medically stable to d/c.   Difficult to place patient No        Code Status:     Code Status Orders  (From admission, onward)           Start     Ordered   05/14/21 0018  Full code  Continuous        05/14/21 0025           Code Status History     This patient has a current code status but no historical code status.         IV Access:   Peripheral IV   Procedures and diagnostic studies:   CT HEAD WO CONTRAST  Result Date: 05/26/2021 CLINICAL DATA:  Intracranial hemorrhage, follow-up EXAM: CT HEAD WITHOUT CONTRAST TECHNIQUE: Contiguous axial images were obtained from the base  of the skull through the vertex without intravenous contrast. COMPARISON:  05/21/2021 FINDINGS: Brain: Interval removal of right frontal approach ventricular catheter. There is minimal postprocedural air in the left lateral ventricle. Intraventricular hemorrhage has significantly decreased. Ventricle caliber is similar. Evolving hemorrhage centered within the left thalamus as  again identified with similar surrounding edema. Similar mass effect including effacement of the third ventricle and adjacent left lateral ventricle. Stable findings of chronic microvascular ischemic changes. 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 Vascular: No new finding. Skull: Calvarium is unremarkable apart from small right frontal burr hole. Sinuses/Orbits: No acute finding. Other: None. IMPRESSION: Interval removal of ventricular catheter. Significantly decreased intraventricular hemorrhage. Ventricle caliber is similar. Evolving left thalamic hemorrhage and edema. No new hemorrhage or worsening of mass effect. Electronically Signed   By: Guadlupe Spanish M.D.   On: 05/26/2021 12:50   DG Swallowing Func-Speech Pathology  Result Date: 05/27/2021 Formatting of this result is different from the original. Objective Swallowing Evaluation: Type of Study: MBS-Modified Barium Swallow Study  Patient Details Name: Markiah Janeway MRN: [id number] Date of Birth: 1960/12/25 Today's Date: 05/27/2021 Time: SLP Start Time (ACUTE ONLY): 0925 -SLP Stop Time (ACUTE ONLY): 0945 SLP Time Calculation (min) (ACUTE ONLY): 20 min Past Medical History: No past medical history on file. Past Surgical History: No past surgical history on file. HPI: Cheris Byrer is a 60 y.o. female presenting to [facility] in transfer from [facility] for management of acute intracranial hemorrhage. 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 Moderate chronic microvascular ischemic changes. No PMH on file.  Subjective: alert, cooperative Assessment / Plan / Recommendation CHL IP CLINICAL IMPRESSIONS 05/27/2021 Clinical Impression Pt presented with resolved dysphagia and a normal oropharyngeal  swallow.  There was adequate oral control and bolus manipulation/propulsion, timely swallow trigger, reliable laryngeal vestibule closure, no penetration/aspiration, good pharyngeal stripping with no residue post-swallow.  When taxed with mixed solid/thin liquid consistencies, pt continued to protect her airway.  She swallowed a 13 mm barium pill with liquid without difficulty.  There is no reason to maintain cortrak at this point.  Recommend D/C TF and initiate a regular diet with thin liquids. D/W Dr. Radonna Ricker. SLP Visit Diagnosis Dysphagia, unspecified (R13.10) Attention and concentration deficit following -- Frontal lobe and executive function deficit following -- Impact on safety and function No limitations   CHL IP TREATMENT RECOMMENDATION 05/27/2021 Treatment Recommendations Therapy as outlined in treatment plan below   Prognosis 05/15/2021 Prognosis for Safe Diet Advancement Good Barriers to Reach Goals -- Barriers/Prognosis Comment -- CHL IP DIET RECOMMENDATION 05/27/2021 SLP Diet Recommendations Regular solids;Thin liquid Liquid Administration via Straw;Cup Medication Administration Whole meds with liquid Compensations Minimize environmental distractions Postural Changes --   CHL IP OTHER RECOMMENDATIONS 05/27/2021 Recommended Consults -- Oral Care Recommendations Oral care BID Other Recommendations --   CHL IP FOLLOW UP RECOMMENDATIONS 05/27/2021 Follow up Recommendations Inpatient Rehab   CHL IP FREQUENCY AND DURATION 05/27/2021 Speech Therapy Frequency (ACUTE ONLY) min 2x/week Treatment Duration 2 weeks      CHL IP ORAL PHASE 05/27/2021 Oral Phase WFL Oral - Pudding Teaspoon -- Oral - Pudding Cup -- Oral - Honey Teaspoon -- Oral - Honey Cup -- Oral - Nectar Teaspoon -- Oral - Nectar Cup -- Oral - Nectar Straw -- Oral - Thin  Teaspoon -- Oral - Thin Cup -- Oral - Thin Straw -- Oral - Puree -- Oral - Mech Soft -- Oral - Regular -- Oral - Multi-Consistency -- Oral - Pill -- Oral Phase - Comment --  CHL IP PHARYNGEAL  PHASE 05/27/2021 Pharyngeal Phase WFL Pharyngeal- Pudding Teaspoon -- Pharyngeal -- Pharyngeal- Pudding Cup -- Pharyngeal -- Pharyngeal- Honey Teaspoon -- Pharyngeal -- Pharyngeal- Honey Cup -- Pharyngeal -- Pharyngeal- Nectar Teaspoon -- Pharyngeal -- Pharyngeal- Nectar Cup -- Pharyngeal -- Pharyngeal- Nectar Straw -- Pharyngeal -- Pharyngeal- Thin Teaspoon -- Pharyngeal -- Pharyngeal- Thin Cup -- Pharyngeal -- Pharyngeal- Thin Straw -- Pharyngeal -- Pharyngeal- Puree -- Pharyngeal -- Pharyngeal- Mechanical Soft -- Pharyngeal -- Pharyngeal- Regular -- Pharyngeal -- Pharyngeal- Multi-consistency -- Pharyngeal -- Pharyngeal- Pill -- Pharyngeal -- Pharyngeal Comment --  No flowsheet data found. Blenda Mounts Laurice 05/27/2021, 9:52 AM                Medical Consultants:   None.   Subjective:    Rubylee Melamed more awake this morning in a good mood tolerating her breakfast.  Objective:    Vitals:   05/27/21 2332 05/28/21 0338 05/28/21 0416 05/28/21 0733  BP: 139/72 130/81  (!) 145/75  Pulse: 71 86  89  Resp: 15 17  16   Temp: 98.5 F (36.9 C) 98.6 F (37 C)  99 F (37.2 C)  TempSrc: Oral Oral  Oral  SpO2: 100% 100%  97%  Weight:   76.2 kg    SpO2: 97 % O2 Flow Rate (L/min): 2 L/min   Intake/Output Summary (Last 24 hours) at 05/28/2021 0822 Last data filed at 05/27/2021 2341 Gross per 24 hour  Intake 850 ml  Output 2200 ml  Net -1350 ml    Filed Weights   05/26/21 0436 05/27/21 0500 05/28/21 0416  Weight: 76.8 kg 75.9 kg 76.2 kg    Exam: General exam: In no acute distress. Respiratory system: Good air movement and clear to auscultation. Cardiovascular system: S1 & S2 heard, RRR. No JVD. Gastrointestinal system: Abdomen is nondistended, soft and nontender.  Extremities: No pedal edema. Skin: No rashes, lesions or ulcers  Data Reviewed:    Labs: Basic Metabolic Panel: Recent Labs  Lab 05/24/21 1026 05/25/21 0350 05/26/21 0602 05/27/21 0456 05/28/21 0138  NA  132* 131* 128* 132* 135  K 4.7 4.5 4.3 4.2 4.1  CL 97* 96* 95* 96* 101  CO2 24 27 27 28 26   GLUCOSE 164* 167* 128* 122* 100*  BUN 33* 26* 20 20 15   CREATININE 0.66 0.58 0.58 0.65 0.63  CALCIUM 8.6* 8.6* 8.5* 8.6* 8.6*    GFR CrCl cannot be calculated (Unknown ideal weight.). Liver Function Tests: Recent Labs  Lab 05/24/21 1026  AST 32  ALT 33  ALKPHOS 62  BILITOT 0.6  PROT 6.8  ALBUMIN 3.3*    No results for input(s): LIPASE, AMYLASE in the last 168 hours. No results for input(s): AMMONIA in the last 168 hours. Coagulation profile No results for input(s): INR, PROTIME in the last 168 hours. COVID-19 Labs  No results for input(s): DDIMER, FERRITIN, LDH, CRP in the last 72 hours.  Lab Results  Component Value Date   SARSCOV2NAA NEGATIVE 05/13/2021    CBC: Recent Labs  Lab 05/23/21 0717 05/24/21 0227 05/25/21 0350 05/26/21 0602  WBC 14.3* 16.5* 17.4* 16.6*  HGB 12.9 12.9 13.6 12.7  HCT 40.0 39.8 41.3 37.7  MCV 97.6 99.0 96.7 95.7  PLT 246 245 263 266  Cardiac Enzymes: No results for input(s): CKTOTAL, CKMB, CKMBINDEX, TROPONINI in the last 168 hours. BNP (last 3 results) No results for input(s): PROBNP in the last 8760 hours. CBG: Recent Labs  Lab 05/27/21 0748 05/27/21 1144 05/27/21 2025 05/27/21 2344 05/28/21 0433  GLUCAP 144* 109* 110* 99 96    D-Dimer: No results for input(s): DDIMER in the last 72 hours. Hgb A1c: No results for input(s): HGBA1C in the last 72 hours. Lipid Profile: Recent Labs    05/26/21 0602  TRIG 108    Thyroid function studies: No results for input(s): TSH, T4TOTAL, T3FREE, THYROIDAB in the last 72 hours.  Invalid input(s): FREET3 Anemia work up: No results for input(s): VITAMINB12, FOLATE, FERRITIN, TIBC, IRON, RETICCTPCT in the last 72 hours. Sepsis Labs: Recent Labs  Lab 05/23/21 0717 05/24/21 0227 05/25/21 0350 05/26/21 0602  WBC 14.3* 16.5* 17.4* 16.6*    Microbiology Recent Results (from the  past 240 hour(s))  Culture, blood (Routine X 2) w Reflex to ID Panel     Status: None (Preliminary result)   Collection Time: 05/24/21  8:56 AM   Specimen: BLOOD  Result Value Ref Range Status   Specimen Description BLOOD RIGHT ANTECUBITAL  Final   Special Requests   Final    BOTTLES DRAWN AEROBIC AND ANAEROBIC Blood Culture results may not be optimal due to an inadequate volume of blood received in culture bottles   Culture   Final    NO GROWTH 4 DAYS Performed at [facility], [address] [place], [place] [address]    Report Status PENDING  Incomplete  Culture, blood (Routine X 2) w Reflex to ID Panel     Status: None (Preliminary result)   Collection Time: 05/24/21  8:56 AM   Specimen: BLOOD RIGHT HAND  Result Value Ref Range Status   Specimen Description BLOOD RIGHT HAND  Final   Special Requests   Final    BOTTLES DRAWN AEROBIC ONLY Blood Culture results may not be optimal due to an inadequate volume of blood received in culture bottles   Culture   Final    NO GROWTH 4 DAYS Performed at [facility], [address] [place], [place] [address]    Report Status PENDING  Incomplete     Medications:    amLODipine  10 mg Oral Daily   chlorhexidine  15 mL Mouth Rinse BID   Chlorhexidine Gluconate Cloth  6 each Topical Daily   heparin  5,000 Units Subcutaneous Q8H   hydrALAZINE  20 mg Oral Q8H   insulin aspart  0-6 Units Subcutaneous Q4H   labetalol  200 mg Oral TID   lisinopril  40 mg Oral Daily   magic mouthwash  1 mL Oral TID   mouth rinse  15 mL Mouth Rinse q12n4p   modafinil  100 mg Oral Daily   senna-docusate  1 tablet Oral BID   Continuous Infusions:  sodium chloride 100 mL/hr at 05/27/21 1133      LOS: 14 days   Marinda ElkAbraham Feliz Ortiz  [facility]  05/28/2021, 8:22 AM

## 2021-05-28 NOTE — Progress Notes (Signed)
Physical Therapy Treatment Patient Details Name: Marissa Morris MRN: [id number] DOB: 05-26-1961 Today's Date: 05/28/2021    History of Present Illness 60 y.o. female presenting to [facility] in transfer from [facility] for management of acute intracranial hemorrhage. CT revealed a 3 cm thalamic hemorrahge with intraventricular extension. 6/3 interval placement of R frontal EVD; removed 6/10.  modafinil on 05/25/2021 No significant PMH.    PT Comments    Patient progressing towards physical therapy goals. Patient requiring maxA to maintain sitting EOB due to R lateral and posterior lean. Able to achieve midline for short bouts with cueing and assistance. Patient performed sit to stand in [place] with maxA+2. Patient able to maintain sitting balance in [place] with modA. Patient continues to be limited by weakness, impaired cognition, R inattention, and impaired balance. Continue to recommend comprehensive inpatient rehab (CIR) for post-acute therapy needs.     Follow Up Recommendations  CIR     Equipment Recommendations  3in1 (PT);Wheelchair (measurements PT);Wheelchair cushion (measurements PT)    Recommendations for Other Services       Precautions / Restrictions Precautions Precautions: Fall Precaution Comments: cortrack Restrictions Weight Bearing Restrictions: No    Mobility  Bed Mobility Overal bed mobility: Needs Assistance Bed Mobility: Rolling;Sidelying to Sit;Sit to Supine Rolling: Mod assist Sidelying to sit: Max assist   Sit to supine: Max assist;+2 for physical assistance   General bed mobility comments: Max A to bring BLEs over EOB and elevate trunk. Pt pushing back into sidelying to R and requiring cues for maintaining midline. Max A +2 for returning to bed and supine.    Transfers Overall transfer level: Needs assistance Equipment used: 2 person hand held assist Transfers: Sit to/from Stand Sit to Stand: Max assist;+2 physical assistance;+2  safety/equipment;From elevated surface         General transfer comment: Max A +2 to power up into standing  Ambulation/Gait                 Stairs             Wheelchair Mobility    Modified Rankin (Stroke Patients Only)       Balance Overall balance assessment: Needs assistance Sitting-balance support: Single extremity supported;Feet supported Sitting balance-Leahy Scale: Poor Sitting balance - Comments: Pushing to R. Able to attain midline for short periods with Max cues and lateral leaning to L. Postural control: Right lateral lean Standing balance support: Single extremity supported;During functional activity Standing balance-Leahy Scale: Poor Standing balance comment: Physical A for maintaining sitting balance                            Cognition Arousal/Alertness: Awake/alert Behavior During Therapy: WFL for tasks assessed/performed Overall Cognitive Status: Impaired/Different from baseline Area of Impairment: Orientation;Attention;Memory;Following commands;Safety/judgement;Awareness;Problem solving                 Orientation Level: Disoriented to;Place;Time;Situation Current Attention Level: Sustained Memory: Decreased recall of precautions;Decreased short-term memory Following Commands: Follows one step commands inconsistently;Follows one step commands with increased time Safety/Judgement: Decreased awareness of safety;Decreased awareness of deficits Awareness: Intellectual Problem Solving: Slow processing;Difficulty sequencing;Requires verbal cues;Requires tactile cues General Comments: pt following 1 step commands 50% of session with delays to respond; requiring increased time for processing. No verbalization this session.Poor awareness and attention to R side of body or environment. Pt leaning her head against her hands when fatigued.      Exercises Other Exercises Other Exercises: Weight  bearing onto LUE and then correcting  posture to midline; x3. sitting at EOB. Max cues    General Comments General comments (skin integrity, edema, etc.): VSS on RA      Pertinent Vitals/Pain Pain Assessment: Faces Faces Pain Scale: No hurt    Home Living                      Prior Function            PT Goals (current goals can now be found in the care plan section) Acute Rehab PT Goals Patient Stated Goal: to eat cookie PT Goal Formulation: With patient/family Time For Goal Achievement: 05/30/21 Potential to Achieve Goals: Good Progress towards PT goals: Progressing toward goals    Frequency    Min 4X/week      PT Plan Current plan remains appropriate    Co-evaluation PT/OT/SLP Co-Evaluation/Treatment: Yes Reason for Co-Treatment: For patient/therapist safety;To address functional/ADL transfers PT goals addressed during session: Mobility/safety with mobility;Balance OT goals addressed during session: ADL's and self-care      AM-PAC PT "6 Clicks" Mobility   Outcome Measure  Help needed turning from your back to your side while in a flat bed without using bedrails?: A Lot Help needed moving from lying on your back to sitting on the side of a flat bed without using bedrails?: Total Help needed moving to and from a bed to a chair (including a wheelchair)?: Total Help needed standing up from a chair using your arms (e.g., wheelchair or bedside chair)?: Total Help needed to walk in hospital room?: Total Help needed climbing 3-5 steps with a railing? : Total 6 Click Score: 7    End of Session Equipment Utilized During Treatment: Gait belt Activity Tolerance: Patient tolerated treatment well Patient left: in bed;with call bell/phone within reach;with bed alarm set Nurse Communication: Mobility status PT Visit Diagnosis: Unsteadiness on feet (R26.81);Hemiplegia and hemiparesis;Muscle weakness (generalized) (M62.81);Difficulty in walking, not elsewhere classified (R26.2);Other symptoms and signs  involving the nervous system (R29.898) Hemiplegia - Right/Left: Right Hemiplegia - dominant/non-dominant: Dominant Hemiplegia - caused by: Other Nontraumatic intracranial hemorrhage     Time: 6270-3500 PT Time Calculation (min) (ACUTE ONLY): 28 min  Charges:  $Therapeutic Activity: 8-22 mins                     Korina Tretter A. Dan Humphreys PT, DPT Acute Rehabilitation Services Pager [phone] Office [phone]    Viviann Spare 05/28/2021, 5:13 PM

## 2021-05-28 NOTE — TOC Initial Note (Addendum)
Transition of Care [facility]) - Initial/Assessment Note    Patient Details  Name: Marissa Morris MRN: [id number] Date of Birth: [birth year]  [facility] CM/SW Contact:    Kermit Balo, RN Phone Number: 05/28/2021, 12:33 PM  Clinical Narrative:                 Recommendations are for CIR. Pts insurance is not in network with a [place] facility. CM spoke to patient and her sister and they are in agreement with her being faxed to the inpatient rehab in [place]. CM has faxed her info: [phone]. Facility to review and update CM. TOC following.   1530: Received info from CIR that [employer] made a mistake and pt can attend CIR at [facility]. CM called patients sister and she says she would rather the patient attend rehab at [facility] over [place]. CM has updated [place] [facility]. CIR to begin insurance authorization.  Expected Discharge Plan: IP Rehab Facility Barriers to Discharge: Continued Medical Work up   Patient Goals and CMS Choice   CMS Medicare.gov Compare Post Acute Care list provided to:: Patient Choice offered to / list presented to : Patient, Sibling  Expected Discharge Plan and Services Expected Discharge Plan: IP Rehab Facility In-house Referral: Clinical Social Work Discharge Planning Services: CM Consult Post Acute Care Choice: IP Rehab Living arrangements for the past 2 months: Single Family Home Expected Discharge Date: 05/28/21                                    Prior Living Arrangements/Services Living arrangements for the past 2 months: Single Family Home Lives with:: Self Patient language and need for interpreter reviewed:: Yes Do you feel safe going back to the place where you live?: Yes      Need for Family Participation in Patient Care: Yes (Comment) Care giver support system in place?: No (comment)   Criminal Activity/Legal Involvement Pertinent to Current Situation/Hospitalization: No - Comment as needed  Activities of Daily Living Home Assistive  Devices/Equipment: None ADL Screening (condition at time of admission) Patient's cognitive ability adequate to safely complete daily activities?: No Is the patient deaf or have difficulty hearing?: Yes Does the patient have difficulty seeing, even when wearing glasses/contacts?: Yes Does the patient have difficulty concentrating, remembering, or making decisions?: Yes Patient able to express need for assistance with ADLs?: No Does the patient have difficulty dressing or bathing?: Yes Independently performs ADLs?: No Communication: Dependent Is this a change from baseline?: Change from baseline, expected to last >3 days Dressing (OT): Dependent Is this a change from baseline?: Change from baseline, expected to last >3 days Grooming: Dependent Is this a change from baseline?: Change from baseline, expected to last >3 days Feeding: Dependent Is this a change from baseline?: Change from baseline, expected to last >3 days Bathing: Dependent Is this a change from baseline?: Change from baseline, expected to last >3 days Toileting: Dependent Is this a change from baseline?: Change from baseline, expected to last >3days In/Out Bed: Dependent Is this a change from baseline?: Change from baseline, expected to last >3 days Walks in Home: Dependent Is this a change from baseline?: Change from baseline, expected to last >3 days Does the patient have difficulty walking or climbing stairs?: Yes Weakness of Legs: Both Weakness of Arms/Hands: Right  Permission Sought/Granted  Emotional Assessment Appearance:: Appears stated age Attitude/Demeanor/Rapport: Engaged Affect (typically observed): Accepting Orientation: : Oriented to Self, Oriented to Place, Oriented to Situation   Psych Involvement: No (comment)  Admission diagnosis:  Hemorrhagic stroke (HCC) [I61.9] ICH (intracerebral hemorrhage) (HCC) [I61.9] Patient Active Problem List   Diagnosis Date Noted   Vaginal  discharge 05/21/2021   Hemorrhagic stroke [facility])    Essential hypertension 05/15/2021   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

## 2021-05-28 NOTE — Progress Notes (Signed)
[facility] IP rehab admissions - I received a call back from [facility] [place] saying they had made a mistake.  After talking with case manager, Tresa Endo, we are moving forward with opening the case requesting CIR.  Will update all once I hear back from [facility] case manager.  Call for questions.  [phone]

## 2021-05-28 NOTE — Discharge Summary (Addendum)
Physician Discharge Summary  Marissa Morris ZOX:[id number] DOB: 1961/02/09 DOA: 05/13/2021  PCP: Pcp, No  Admit date: 05/13/2021 Discharge date: 05/30/2021  Admitted From: Home Disposition: Inpatient rehab   Recommendations for Outpatient Follow-up:  Follow up with PCP in 1-2 weeks Please obtain BMP/CBC in one week  Home Health:no  Equipment/Devices:None  Discharge Condition:Stable CODE STATUS:Full Diet recommendation: Heart Healthy   Brief/Interim Summary: 60 y.o. 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 Repeated CT of the head on 05/21/2021 showed decrease interventricular clots. Continue Keppra for seizure prophylaxis. Blood pressure relatively stable on hydralazine, amlodipine labetalol lisinopril. Patient mentation is significantly improved compared to previous. . Patient is significantly more awake today.  Hydrocephalus Repeated CT on 05/18/2021 show mild decrease in ventricular size she is status post removal of her intraventricular shunt on 05/22/2021. Repeated CT scan on 05/16/2021 showed improved IVH with stable ventricular size.  Hypertensive emergency: Off drips now on amlodipine, hydralazine, lisinopril and labetalol. Blood pressure relatively well controlled ranging from 120-145/75   Encephalopathy due to  hemorrhagic CVA versus underlying infection: He was started empirically on modafinil on 05/25/2021 due to lethargy, it was discussed with neurology if not effective can be decreased to 200 mg p.o. daily.  Nutrition: Core track discontinued, she is currently on a regular diet. No signs of aspiration tolerating it well.  Fever and leukocytosis: Work-up has been negative, culture, UA, chest x-ray abdominal x-ray. Antibiotics were discontinued on 05/21/2021, since then she has remained afebrile. T-max of 99.4 mild leukocytosis but is slowly improving off antibiotics.  Likely stress margination.   Chronic grade 2 diastolic heart failure: Appears euvolemic after IV hydration. Continue current medications see above for further details.  Hypovolemic hyponatremia: Will improve with IV normal saline NG tube was discontinued she was able to hydrate orally has remained stable.  Hyperlipidemia: Continue statin as an outpatient.  Hyperglycemia, With an A1c of 5.7 will need to f be reevaluated in the future.  Vaginal discharge: Now resolved.  Discharge Instructions  Discharge Instructions     Ambulatory referral to Neurology   Complete by: As directed    Follow up with stroke clinic NP (Jessica Vanschaick or Darrol Angel, if both not available, consider Manson Allan, or Ahern) at [facility] in about 4 weeks. Thanks.   Diet - low sodium heart healthy   Complete by: As directed    Increase activity slowly   Complete by: As directed    No wound care   Complete by: As directed       Allergies as of 05/30/2021       Reactions   Other Other (See Comments), Cough   Pollen- Sneezing, also        Medication List     TAKE these medications    amLODipine 10 MG tablet Commonly known as: NORVASC Take 1 tablet (10 mg total) by mouth daily.   Bayer Low  Dose 81 MG EC tablet Generic drug: aspirin Take 81 mg by mouth daily. Swallow whole.   hydrALAZINE 10 MG tablet Commonly known as:  APRESOLINE Take 2 tablets (20 mg total) by mouth every 8 (eight) hours.   labetalol 200 MG tablet Commonly known as: NORMODYNE Take 1 tablet (200 mg total) by mouth 2 (two) times daily.   lisinopril 20 MG tablet Commonly known as: ZESTRIL Take 2 tablets (40 mg total) by mouth daily.   modafinil 100 MG tablet Commonly known as: PROVIGIL Take 1 tablet (100 mg total) by mouth daily.   One-A-Day Womens 50+ Tabs Take 1 tablet by mouth daily.   vitamin C 500 MG tablet Commonly known as: ASCORBIC ACID Take 500-1,000 mg by mouth daily.        Follow-up Information     [facility]. Schedule an appointment as soon as possible for a visit in 1 month(s).   Specialty: Neurology Why: stroke clinic Contact information: [address] [place] [address] [phone]               Allergies  Allergen Reactions   Other Other (See Comments) and Cough    Pollen- Sneezing, also    Consultations: Pulmonary and critical care Neurology stroke team.   Procedures/Studies: DG Abd 1 View  Result Date: 05/25/2021 CLINICAL DATA:  Diarrhea EXAM: ABDOMEN - 1 VIEW COMPARISON:  May 15, 2021 FINDINGS: Feeding tube tip in distal stomach. Mild stool volume in the colon. There is no bowel dilatation or air-fluid level to suggest bowel obstruction. No free air. Focus of calcification in the right pelvis measuring 2 x 2 cm, a likely calcified uterine leiomyoma. IMPRESSION: Feeding tube tip in distal stomach. No bowel obstruction or free air. Mild stool volume in colon. Probable calcified uterine leiomyoma in right pelvis. Electronically Signed   By: Bretta Bang III M.D.   On: 05/25/2021 16:27   DG Abd 1 View  Result Date: 05/14/2021 CLINICAL DATA:  60 year old female with intracranial hemorrhage. EXAM: ABDOMEN - 1 VIEW COMPARISON:  Portable pelvis radiograph today reported separately. FINDINGS: Portable AP supine view at 0344 hours. Non obstructed  bowel gas pattern. No definite pneumoperitoneum on this supine view. Minimally included left lung base appears grossly negative. Scoliosis and degenerative changes in the spine. Pelvic dystrophic and/or vascular calcifications, mild. No acute osseous abnormality identified. IMPRESSION: 1. Normal bowel gas pattern. 2. Spinal scoliosis and degeneration. Electronically Signed   By: Odessa Fleming M.D.   On: 05/14/2021 06:19   CT HEAD WO CONTRAST  Result Date: 05/26/2021 CLINICAL DATA:  Intracranial hemorrhage, follow-up EXAM: CT HEAD WITHOUT CONTRAST TECHNIQUE: Contiguous axial images were obtained from the base of the skull through the vertex without intravenous contrast. COMPARISON:  05/21/2021 FINDINGS: Brain: Interval removal of right frontal approach ventricular catheter. There is minimal postprocedural air in the left lateral ventricle. Intraventricular hemorrhage has significantly decreased. Ventricle caliber is similar. Evolving hemorrhage centered within the left thalamus as again identified with similar surrounding edema. Similar mass effect including effacement of the third ventricle and adjacent left lateral ventricle. Stable findings of chronic microvascular ischemic changes. 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 Vascular: No new finding. Skull: Calvarium is unremarkable apart from small right frontal burr hole. Sinuses/Orbits: No acute finding. Other: None. IMPRESSION: Interval removal of ventricular catheter. Significantly decreased intraventricular hemorrhage. Ventricle caliber is similar. Evolving left thalamic hemorrhage and edema. No new hemorrhage or worsening of mass effect. Electronically Signed  By: Guadlupe Spanish M.D.   On: 05/26/2021 12:50   CT HEAD WO CONTRAST  Result Date: 05/21/2021 CLINICAL DATA:  Stroke follow-up EXAM: CT HEAD WITHOUT CONTRAST TECHNIQUE: Contiguous axial images were obtained from the base of the skull through the vertex without intravenous  contrast. COMPARISON:  Three days ago FINDINGS: Brain: Left thalamic hematoma is unchanged in size and shape, measuring up to 4.2 x 3.1 cm on axial slices. Intraventricular clot has decreased in thickness at the level of the lateral ventricles. No increased ventricular size. Chronic small vessel ischemia in the hemispheric white matter. Negative for cortical infarct. 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 Stable positioning of EVD which traverses the frontal horn of the right lateral ventricle. Vascular: Negative Skull: Negative Sinuses/Orbits: Unremarkable IMPRESSION: 1. No recurrent hemorrhage.  Decreasing intraventricular clot. 2. Stable EVD positioning with unremarkable ventricular size. Electronically Signed   By: Marnee Spring M.D.   On: 05/21/2021 10:57   CT HEAD WO CONTRAST  Result Date: 05/18/2021 CLINICAL DATA:  60 year old female with left thalamic hemorrhage, IVH, EVD. EXAM: CT HEAD WITHOUT CONTRAST TECHNIQUE: Contiguous axial images were obtained from the base of the skull through the vertex without intravenous contrast. COMPARISON:  Head CT 05/15/2021 and earlier. FINDINGS: Brain: Right superior frontal approach EVD with catheter terminating at the 3rd ventricle and communicating with the right lateral ventricle on coronal image 35. The 3rd and 4th ventricles remain relatively decompressed. There is less 3rd and 4th ventricular IVH. Lateral ventricle size also mildly decreased since 05/15/2021, although with moderate residual lateral ventricular blood greater on the left. Oval left thalamic intra-axial hemorrhage encompasses 34 x 22 by 33 mm now (AP by transverse by CC) for an estimated blood volume of 12 mL, and has not changed since 05/15/2021. Regional edema and mild regional mass effect are stable. Stable gray-white matter differentiation elsewhere with Patchy and confluent white matter hypodensity. No new cortically based infarct identified. Basilar cisterns are patent. No  significant midline shift. Vascular: Calcified atherosclerosis at the skull base. Skull: Stable right superior burr hole. Sinuses/Orbits: Visualized paranasal sinuses and mastoids are stable and well aerated. Other: Right nasoenteric tube in place. Mild postoperative changes to the scalp vertex. Disconjugate gaze but otherwise negative orbits soft tissues. IMPRESSION: 1. Stable left thalamic intra-axial hemorrhage size since 05/15/2021. Stable regional edema and mild regional mass effect. 2. Stable right frontal approach EVD with slightly decreased lateral ventricle size since 05/15/2021. Moderate lateral ventricle IVH persists. There is less blood in the small 3rd and 4th ventricles now. 3. Underlying chronic small vessel disease. No new intracranial abnormality. Electronically Signed   By: Odessa Fleming M.D.   On: 05/18/2021 06:31   CT HEAD WO CONTRAST  Result Date: 05/15/2021 CLINICAL DATA:  Cerebral hemorrhage suspected. EXAM: CT HEAD WITHOUT CONTRAST TECHNIQUE: Contiguous axial images were obtained from the base of the skull through the vertex without intravenous contrast. COMPARISON:  MRI and CT of the brain May 14, 2021. FINDINGS: Brain: No significant interval change size of the left thalamic hemorrhage with hematoma now measuring approximately 3.5 x 3.3 x 2 cm compared to 3.3 x 3.3 x 2.2 cm on prior. Persistent hypodensity surrounding the hematoma related to vasogenic edema. Supratentorial and infratentorial intraventricular hemorrhage related to hematoma decompression into the right lateral ventricle, status post right frontal approach ventricular drain placement. Drain tip is at level of the right foramen of Monro. There is mild interval decrease in size of the supratentorial ventricles, particularly the temporal  horns. Patchy hypodensity of the white matter of the cerebral hemispheres, nonspecific most likely related to small vessel ischemia. Remote lacunar infarct in the right radiata. Vascular: No  hyperdense vessel or unexpected calcification. Skull: Negative for fracture or focal lesion. Sinuses/Orbits: No acute finding. Other: None. IMPRESSION: 1. Interval placement of a right frontal approach ventricular drain with the tip at the level of the right foramen of Monro. Interval mild decrease in size of the ventricular system, particularly the temporal horns of the lateral ventricles. 2. Stable appearance of left thalamic intraparenchymal hemorrhage with intraventricular extension. Electronically Signed   By: Baldemar Lenis M.D.   On: 05/15/2021 09:41   CT HEAD WO CONTRAST  Result Date: 05/14/2021 CLINICAL DATA:  Intraparenchymal hemorrhage.  Follow-up. EXAM: CT HEAD WITHOUT CONTRAST TECHNIQUE: Contiguous axial images were obtained from the base of the skull through the vertex without intravenous contrast. COMPARISON:  Same day CT head. FINDINGS: Mildly motion limited exam.  Within this limitation: Brain: When accounting for differences in technique, similar size of the acute intraparenchymal hemorrhage centered in the left thalamus which measures up to 3.3 x 2.2 x 3.3 cm (similar when remeasured on the prior). Similar regional mass effect. Similar intraventricular hemorrhage within the left greater than right lateral ventricles, third ventricle, and fourth ventricle. Similar ventriculomegaly with mild periventricular edema and prominence of the temporal horns. Additional scattered white matter hypoattenuation is nonspecific but most likely related to chronic microvascular ischemic disease. No evidence of acute large vascular territory infarct. Similar suspected prior infarct in the right corona radiata. Vascular: No hyperdense vessel identified. Calcific atherosclerosis. Skull: No acute fracture. Sinuses/Orbits: No acute findings. Other: No mastoid effusions. IMPRESSION: When accounting for differences in technique, similar size of the acute left thalamic intraparenchymal hemorrhage. Similar  intraventricular extension of hemorrhage and ventriculomegaly. Electronically Signed   By: Feliberto Harts MD   On: 05/14/2021 06:21   CT HEAD WO CONTRAST  Result Date: 05/14/2021 CLINICAL DATA:  Intraparenchymal hemorrhage. Follow-up of scans performed at [place] [place] [facility]. EXAM: CT HEAD WITHOUT CONTRAST TECHNIQUE: Contiguous axial images were obtained from the base of the skull through the vertex without intravenous contrast. COMPARISON:  None. I am not able to view the outside institution scans. FINDINGS: Brain: Intraparenchymal hematoma centered in the left thalamus measures 3.2 x 2.2 x 3.0 cm (volume = 11 cm^3). There is moderate hydrocephalus with periventricular hypoattenuation suggesting transependymal interstitial edema. There is rightward bulging of the septum pellucidum. Large amount of blood in the ventricles. Vascular: No abnormal hyperdensity of the major intracranial arteries or dural venous sinuses. No intracranial atherosclerosis. Skull: The visualized skull base, calvarium and extracranial soft tissues are normal. Sinuses/Orbits: No fluid levels or advanced mucosal thickening of the visualized paranasal sinuses. No mastoid or middle ear effusion. The orbits are normal. IMPRESSION: 1. Intraparenchymal hematoma centered in the left thalamus with intraventricular extension and moderate hydrocephalus with transependymal interstitial edema. Critical Value/emergent results were called by telephone at the time of interpretation on 05/14/2021 at 12:58 am to provider ERIC [facility] , who verbally acknowledged these results. Electronically Signed   By: Deatra Robinson M.D.   On: 05/14/2021 00:58   MR MRA HEAD WO CONTRAST  Result Date: 05/14/2021 CLINICAL DATA:  Intracranial hemorrhage EXAM: MRI HEAD WITHOUT CONTRAST MRA HEAD WITHOUT CONTRAST TECHNIQUE: Multiplanar, multi-echo pulse sequences of the brain and surrounding structures were acquired without intravenous contrast. Angiographic images of  the Circle of Willis were acquired using MRA technique without intravenous contrast. COMPARISON:  No pertinent prior exam. COMPARISON:  Correlation made with prior head CT FINDINGS: MRI HEAD Brain: Left thalamic hemorrhage is again identified similar to the recent CT with acute blood products. There is mild surrounding edema. Significant intraventricular extension is present as seen on CT with resulting hydrocephalus. Focus susceptibility in the right corona radiata reflects chronic microhemorrhage associated with a lacunar infarct. Additional patchy and confluent areas of T2 hyperintensity in the supratentorial white matter are nonspecific but probably reflect moderate chronic microvascular ischemic changes. No post contrast imaging performed therefore potential underlying lesion is not well evaluated. Vascular: Major vessel flow voids at the skull base are preserved. Skull and upper cervical spine: Normal marrow signal is preserved. Sinuses/Orbits: Paranasal sinuses are aerated. Orbits are unremarkable. Other: Sella is unremarkable.  Mastoid air cells are clear. MRA HEAD Intracranial internal carotid arteries are patent. Middle and anterior cerebral arteries are patent. Intracranial vertebral arteries, basilar artery, posterior cerebral arteries are patent. There is no significant stenosis or aneurysm. No abnormal flow related enhancement in the region of thalamic hemorrhage. IMPRESSION: No substantial change in left thalamic hemorrhage with significant intraventricular extension and hydrocephalus as well as mild edema and mass effect compared to recent CT. 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 Moderate chronic microvascular ischemic changes. Unremarkable MRA. Electronically Signed   By: Guadlupe Spanish M.D.   On: 05/14/2021 11:18   MR BRAIN WO CONTRAST  Result Date: 05/14/2021 CLINICAL DATA:  Intracranial hemorrhage EXAM: MRI HEAD WITHOUT CONTRAST MRA HEAD WITHOUT  CONTRAST TECHNIQUE: Multiplanar, multi-echo pulse sequences of the brain and surrounding structures were acquired without intravenous contrast. Angiographic images of the Circle of Willis were acquired using MRA technique without intravenous contrast. COMPARISON: No pertinent prior exam. COMPARISON:  Correlation made with prior head CT FINDINGS: MRI HEAD Brain: Left thalamic hemorrhage is again identified similar to the recent CT with acute blood products. There is mild surrounding edema. Significant intraventricular extension is present as seen on CT with resulting hydrocephalus. Focus susceptibility in the right corona radiata reflects chronic microhemorrhage associated with a lacunar infarct. Additional patchy and confluent areas of T2 hyperintensity in the supratentorial white matter are nonspecific but probably reflect moderate chronic microvascular ischemic changes. No post contrast imaging performed therefore potential underlying lesion is not well evaluated. Vascular: Major vessel flow voids at the skull base are preserved. Skull and upper cervical spine: Normal marrow signal is preserved. Sinuses/Orbits: Paranasal sinuses are aerated. Orbits are unremarkable. Other: Sella is unremarkable.  Mastoid air cells are clear. MRA HEAD Intracranial internal carotid arteries are patent. Middle and anterior cerebral arteries are patent. Intracranial vertebral arteries, basilar artery, posterior cerebral arteries are patent. There is no significant stenosis or aneurysm. No abnormal flow related enhancement in the region of thalamic hemorrhage. IMPRESSION: No substantial change in left thalamic hemorrhage with significant intraventricular extension and hydrocephalus as well as mild edema and mass effect compared to recent CT. 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 Moderate chronic microvascular ischemic changes. Unremarkable MRA. Electronically Signed   By: Guadlupe Spanish  M.D.   On: 05/14/2021 11:18   DG Pelvis Portable  Result Date: 05/14/2021 CLINICAL DATA:  60 year old female with intracranial hemorrhage. EXAM: PORTABLE PELVIS 1-2 VIEWS COMPARISON:  None. FINDINGS: Portable AP view at 0346 hours. Mild likely fibroid associated dystrophic calcifications in the pelvis eccentric to the right. Femoral heads normally located. Pelvis and proximal femurs appear intact. No acute osseous abnormality identified.  Negative visible bowel gas pattern. IMPRESSION: Negative. Electronically Signed   By: Odessa Fleming M.D.   On: 05/14/2021 06:18   DG Chest Port 1 View  Result Date: 05/24/2021 CLINICAL DATA:  CVA.  Shortness of breath. EXAM: PORTABLE CHEST 1 VIEW COMPARISON:  05/17/2021 FINDINGS: Feeding tube extends beyond the inferior aspect of the film. Midline trachea. Moderate cardiomegaly. Mild right hemidiaphragm elevation. The left costophrenic angle is minimally excluded. No pleural effusion or pneumothorax. No congestive failure. No lobar consolidation. IMPRESSION: Cardiomegaly without congestive failure. Electronically Signed   By: Jeronimo Greaves M.D.   On: 05/24/2021 09:14   DG CHEST PORT 1 VIEW  Result Date: 05/17/2021 CLINICAL DATA:  Respiratory failure EXAM: PORTABLE CHEST 1 VIEW COMPARISON:  May 14, 2021 FINDINGS: Feeding tube tip is below the diaphragm. No pneumothorax. There is no edema or airspace opacity. There is cardiomegaly with pulmonary vascularity normal. No adenopathy. No bone lesions. IMPRESSION: No edema or airspace opacity. Cardiomegaly, stable. Feeding tube tip below diaphragm. Electronically Signed   By: Bretta Bang III M.D.   On: 05/17/2021 14:17   DG CHEST PORT 1 VIEW  Result Date: 05/14/2021 CLINICAL DATA:  Provided history: Follow-up. EXAM: PORTABLE CHEST 1 VIEW COMPARISON:  None. FINDINGS: Cardiomediastinal silhouette is enlarged, likely accentuated by AP technique. Calcific atherosclerosis of the aorta. Low lung volumes. No consolidation. EKG leads  bilaterally. No visible pleural effusions or pneumothorax on this single semi erect AP radiograph. IMPRESSION: 1. Low lung volumes without evidence of acute cardiopulmonary disease. 2. Cardiomegaly. Electronically Signed   By: Feliberto Harts MD   On: 05/14/2021 06:13   DG Abd Portable 1V  Result Date: 05/15/2021 CLINICAL DATA:  Feeding tube placement. EXAM: PORTABLE ABDOMEN - 1 VIEW COMPARISON:  None. FINDINGS: Feeding tube is in place with the tip in the distal stomach directed toward the duodenum. IMPRESSION: As above. Electronically Signed   By: Drusilla Kanner M.D.   On: 05/15/2021 11:40   DG Swallowing Func-Speech Pathology  Result Date: 05/27/2021 Formatting of this result is different from the original. Objective Swallowing Evaluation: Type of Study: MBS-Modified Barium Swallow Study  Patient Details Name: Marissa Morris MRN: [id number] Date of Birth: 09-Feb-1961 Today's Date: 05/27/2021 Time: SLP Start Time (ACUTE ONLY): 0925 -SLP Stop Time (ACUTE ONLY): 0945 SLP Time Calculation (min) (ACUTE ONLY): 20 min Past Medical History: No past medical history on file. Past Surgical History: No past surgical history on file. HPI: Anjulie Erber is a 60 y.o. female presenting to [facility] in transfer from [facility] for management of acute intracranial hemorrhage. 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 Moderate chronic microvascular ischemic changes. No PMH on file.  Subjective: alert, cooperative Assessment / Plan / Recommendation CHL IP CLINICAL IMPRESSIONS 05/27/2021 Clinical Impression Pt presented with resolved dysphagia and a normal oropharyngeal swallow.  There was adequate oral control and bolus manipulation/propulsion, timely swallow trigger, reliable laryngeal  vestibule closure, no penetration/aspiration, good pharyngeal stripping with no residue post-swallow.  When taxed with mixed solid/thin liquid consistencies, pt continued to protect her airway.  She swallowed a 13 mm barium pill with liquid without difficulty.  There is no reason to maintain cortrak at this point.  Recommend D/C TF and initiate a regular diet with thin liquids. D/W Dr. Radonna Ricker. SLP Visit Diagnosis Dysphagia, unspecified (R13.10)  Attention and concentration deficit following -- Frontal lobe and executive function deficit following -- Impact on safety and function No limitations   CHL IP TREATMENT RECOMMENDATION 05/27/2021 Treatment Recommendations Therapy as outlined in treatment plan below   Prognosis 05/15/2021 Prognosis for Safe Diet Advancement Good Barriers to Reach Goals -- Barriers/Prognosis Comment -- [facility] IP DIET RECOMMENDATION 05/27/2021 SLP Diet Recommendations Regular solids;Thin liquid Liquid Administration via Straw;Cup Medication Administration Whole meds with liquid Compensations Minimize environmental distractions Postural Changes --   [facility]L IP OTHER RECOMMENDATIONS 05/27/2021 Recommended Consults -- Oral Care Recommendations Oral care BID Other Recommendations --   [facility] IP FOLLOW UP RECOMMENDATIONS 05/27/2021 Follow up Recommendations Inpatient Rehab   [facility]L IP FREQUENCY AND DURATION 05/27/2021 Speech Therapy Frequency (ACUTE ONLY) min 2x/week Treatment Duration 2 weeks      CHL IP ORAL PHASE 05/27/2021 Oral Phase WFL Oral - Pudding Teaspoon -- Oral - Pudding Cup -- Oral - Honey Teaspoon -- Oral - Honey Cup -- Oral - Nectar Teaspoon -- Oral - Nectar Cup -- Oral - Nectar Straw -- Oral - Thin Teaspoon -- Oral - Thin Cup -- Oral - Thin Straw -- Oral - Puree -- Oral - Mech Soft -- Oral - Regular -- Oral - Multi-Consistency -- Oral - Pill -- Oral Phase - Comment --  [facility] IP PHARYNGEAL PHASE 05/27/2021 Pharyngeal Phase WFL Pharyngeal- Pudding Teaspoon -- Pharyngeal -- Pharyngeal- Pudding Cup -- Pharyngeal  -- Pharyngeal- Honey Teaspoon -- Pharyngeal -- Pharyngeal- Honey Cup -- Pharyngeal -- Pharyngeal- Nectar Teaspoon -- Pharyngeal -- Pharyngeal- Nectar Cup -- Pharyngeal -- Pharyngeal- Nectar Straw -- Pharyngeal -- Pharyngeal- Thin Teaspoon -- Pharyngeal -- Pharyngeal- Thin Cup -- Pharyngeal -- Pharyngeal- Thin Straw -- Pharyngeal -- Pharyngeal- Puree -- Pharyngeal -- Pharyngeal- Mechanical Soft -- Pharyngeal -- Pharyngeal- Regular -- Pharyngeal -- Pharyngeal- Multi-consistency -- Pharyngeal -- Pharyngeal- Pill -- Pharyngeal -- Pharyngeal Comment --  No flowsheet data found. Blenda Mounts Laurice 05/27/2021, 9:52 AM              ECHOCARDIOGRAM COMPLETE  Result Date: 05/16/2021    ECHOCARDIOGRAM REPORT   Patient Name:   Marissa Morris Date of Exam: 05/14/2021 Medical Rec #:  [id number]    Height: Accession #:    [id number]   Weight:       166.0 lb Date of Birth:  08/08/1961    BSA:          1.783 m Patient Age:    59 years     BP:           122/69 mmHg Patient Gender: F            HR:           77 bpm. Exam Location:  Inpatient Procedure: 2D Echo, Cardiac Doppler and Color Doppler Indications:    Stroke  History:        Patient has no prior history of Echocardiogram examinations.  Sonographer:    Ross Ludwig RDCS (AE) Referring Phys: [phone] ERIC LINDZEN  Sonographer Comments: Patient not responsive to request to sniff for IVC collapse test. IMPRESSIONS  1. Left ventricular ejection fraction, by estimation, is 50 to 55%. The left ventricle has low normal function. The left ventricle has no regional wall motion abnormalities. There is moderate thickening of the septum. The rest of the LV segments demonstrate mild left ventricular hypertrophy. Left ventricular diastolic parameters are consistent with Grade II diastolic dysfunction (pseudonormalization). Elevated left ventricular end-diastolic pressure.  2. The average left ventricular global longitudinal  strain is -6.7 %. The global longitudinal strain is abnormal. Strain  pattern shows relative apical sparring which may be suggestive of amyloid in the right clinic context.  3. Right ventricular systolic function is normal. The right ventricular size is normal.  4. The mitral valve is normal in structure. Trivial mitral valve regurgitation. No evidence of mitral stenosis.  5. The aortic valve is tricuspid. There is mild calcification of the aortic valve. There is mild thickening of the aortic valve. Aortic valve regurgitation is not visualized. Mild aortic valve sclerosis is present, with no evidence of aortic valve stenosis.  6. The inferior vena cava is dilated in size with <50% respiratory variability, suggesting right atrial pressure of 15 mmHg.  7. Left atrial size was mildly dilated. Comparison(s): No prior Echocardiogram. Conclusion(s)/Recommendation(s): No intracardiac source of embolism detected on this transthoracic study. A transesophageal echocardiogram is recommended to exclude cardiac source of embolism if clinically indicated. FINDINGS  Left Ventricle: Left ventricular ejection fraction, by estimation, is 50 to 55%. The left ventricle has low normal function. The left ventricle has no regional wall motion abnormalities. The average left ventricular global longitudinal strain is -6.7 %.  The global longitudinal strain is abnormal. The left ventricular internal cavity size was normal in size. There is moderate thickening of the septum. The rest of the LV segments demonstrate mild left ventricular hypertrophy. Left ventricular diastolic parameters are consistent with Grade II diastolic dysfunction (pseudonormalization). Elevated left ventricular end-diastolic pressure. Right Ventricle: The right ventricular size is normal. No increase in right ventricular wall thickness. Right ventricular systolic function is normal. Left Atrium: Left atrial size was mildly dilated. Right Atrium: Right atrial size was normal in size. Pericardium: There is no evidence of pericardial  effusion. Mitral Valve: The mitral valve is normal in structure. There is mild thickening of the mitral valve leaflet(s). There is mild calcification of the mitral valve leaflet(s). Mild mitral annular calcification. Trivial mitral valve regurgitation. No evidence  of mitral valve stenosis. MV peak gradient, 7.1 mmHg. The mean mitral valve gradient is 4.0 mmHg. Tricuspid Valve: The tricuspid valve is normal in structure. Tricuspid valve regurgitation is trivial. No evidence of tricuspid stenosis. Aortic Valve: The aortic valve is tricuspid. There is mild calcification of the aortic valve. There is mild thickening of the aortic valve. Aortic valve regurgitation is not visualized. Mild aortic valve sclerosis is present, with no evidence of aortic valve stenosis. Aortic valve mean gradient measures 8.0 mmHg. Aortic valve peak gradient measures 16.3 mmHg. Aortic valve area, by VTI measures 2.05 cm. Pulmonic Valve: The pulmonic valve was normal in structure. Pulmonic valve regurgitation is trivial. No evidence of pulmonic stenosis. Aorta: The aortic root and ascending aorta are structurally normal, with no evidence of dilitation. Venous: The inferior vena cava is dilated in size with less than 50% respiratory variability, suggesting right atrial pressure of 15 mmHg. IAS/Shunts: No atrial level shunt detected by color flow Doppler.  LEFT VENTRICLE PLAX 2D LVIDd:         4.70 cm  Diastology LVIDs:         4.20 cm  LV e' medial:    5.55 cm/s LV PW:         1.60 cm  LV E/e' medial:  23.2 LV IVS:        1.80 cm  LV e' lateral:   6.31 cm/s LVOT diam:     1.80 cm  LV E/e' lateral: 20.4 LV SV:  69 LV SV Index:   39       2D Longitudinal Strain LVOT Area:     2.54 cm 2D Strain GLS Avg:     -6.7 %  RIGHT VENTRICLE             IVC RV Basal diam:  3.50 cm     IVC diam: 2.30 cm RV S prime:     13.20 cm/s TAPSE (M-mode): 3.2 cm LEFT ATRIUM             Index       RIGHT ATRIUM           Index LA diam:        4.10 cm 2.30 cm/m   RA Area:     16.30 cm LA Vol (A2C):   42.1 ml 23.61 ml/m RA Volume:   45.00 ml  25.23 ml/m LA Vol (A4C):   43.6 ml 24.45 ml/m LA Biplane Vol: 47.3 ml 26.52 ml/m  AORTIC VALVE AV Area (Vmax):    1.86 cm AV Area (Vmean):   2.09 cm AV Area (VTI):     2.05 cm AV Vmax:           202.00 cm/s AV Vmean:          135.000 cm/s AV VTI:            0.336 m AV Peak Grad:      16.3 mmHg AV Mean Grad:      8.0 mmHg LVOT Vmax:         148.00 cm/s LVOT Vmean:        111.000 cm/s LVOT VTI:          0.271 m LVOT/AV VTI ratio: 0.81  AORTA Ao Root diam: 2.90 cm Ao Asc diam:  3.20 cm MITRAL VALVE MV Area (PHT): 3.08 cm     SHUNTS MV Area VTI:   1.83 cm     Systemic VTI:  0.27 m MV Peak grad:  7.1 mmHg     Systemic Diam: 1.80 cm MV Mean grad:  4.0 mmHg MV Vmax:       1.33 m/s MV Vmean:      90.6 cm/s MV Decel Time: 246 msec MV E velocity: 129.00 cm/s MV A velocity: 118.00 cm/s MV E/A ratio:  1.09 Laurance Flatten MD Electronically signed by Laurance Flatten MD Signature Date/Time: 05/16/2021/7:51:23 AM    Final    VAS US CAROTID  Result Date: 05/15/2021 Carotid Arterial Duplex Study Patient Name:  Marissa Morris  Date of Exam:   05/14/2021 Medical Rec #: [id number]     Accession #:    [id number] Date of Birth: July 11, 1961     Patient Gender: F Patient Age:   34Y Exam Location:  [facility] Procedure:      VAS US CAROTID Referring Phys: [id number] Marvel Plan --------------------------------------------------------------------------------  Indications:       Hypertensive emergency (122/128). 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 Risk Factors:      Hypertension. Comparison Study:  No prior study on file Performing Technologist: Sherren Kerns RVS  Examination Guidelines: A complete evaluation includes B-mode imaging, spectral Doppler, color Doppler, and power Doppler as needed of all accessible portions of each vessel. Bilateral testing is considered an integral part of a complete  examination. Limited examinations for reoccurring indications may be performed as noted.  Right Carotid  Findings: +----------+--------+--------+--------+------------------+--------+           PSV cm/sEDV cm/sStenosisPlaque DescriptionComments +----------+--------+--------+--------+------------------+--------+ CCA Prox  96      9                                          +----------+--------+--------+--------+------------------+--------+ CCA Distal104     13                                         +----------+--------+--------+--------+------------------+--------+ ICA Prox  66      15      1-39%   heterogenous               +----------+--------+--------+--------+------------------+--------+ ICA Distal54      13                                         +----------+--------+--------+--------+------------------+--------+ ECA       151     22                                         +----------+--------+--------+--------+------------------+--------+ +----------+--------+-------+--------+-------------------+           PSV cm/sEDV cmsDescribeArm Pressure (mmHG) +----------+--------+-------+--------+-------------------+ Subclavian150                                        +----------+--------+-------+--------+-------------------+ +---------+--------+--+--------+--+---------+ VertebralPSV cm/s41EDV cm/s12Antegrade +---------+--------+--+--------+--+---------+  Left Carotid Findings: +----------+--------+--------+--------+------------------+------------------+           PSV cm/sEDV cm/sStenosisPlaque DescriptionComments           +----------+--------+--------+--------+------------------+------------------+ CCA Prox  180     15                                                   +----------+--------+--------+--------+------------------+------------------+ CCA Distal98      13                                intimal thickening  +----------+--------+--------+--------+------------------+------------------+ ICA Prox  70      22      1-39%                     intimal thickening +----------+--------+--------+--------+------------------+------------------+ ICA Distal68      22                                                   +----------+--------+--------+--------+------------------+------------------+ ECA       91      18                                                   +----------+--------+--------+--------+------------------+------------------+ +----------+--------+--------+--------+-------------------+  PSV cm/sEDV cm/sDescribeArm Pressure (mmHG) +----------+--------+--------+--------+-------------------+ Subclavian87                                          +----------+--------+--------+--------+-------------------+ +---------+--------+--+--------+--+---------+ VertebralPSV cm/s61EDV cm/s13Antegrade +---------+--------+--+--------+--+---------+   Summary: Right Carotid: Velocities in the right ICA are consistent with a 1-39% stenosis. Left Carotid: Velocities in the left ICA are consistent with a 1-39% stenosis. Vertebrals: Bilateral vertebral arteries demonstrate antegrade flow. *See table(s) above for measurements and observations.  Electronically signed by Marvel Plan MD on 05/15/2021 at 2:54:40 PM.    Final     Subjective: In a good mood this morning no complaints.  Discharge Exam: Vitals:   05/30/21 0351 05/30/21 0551  BP: 130/72 105/72  Pulse: 79   Resp: 16   Temp: 98.2 F (36.8 C)   SpO2: 98%    Vitals:   05/29/21 1944 05/29/21 2308 05/30/21 0351 05/30/21 0551  BP: 135/82 133/74 130/72 105/72  Pulse: 76 66 79   Resp: Temp: 97.9 F (36.6 C) 98 F (36.7 C) 98.2 F (36.8 C)   TempSrc: Oral Oral Oral   SpO2: 98% 98% 98%   Weight:        General: Pt is alert, awake, not in acute distress Cardiovascular: RRR, S1/S2 +, no rubs, no gallops Respiratory:  CTA bilaterally, no wheezing, no rhonchi Abdominal: Soft, NT, ND, bowel sounds + Extremities: no edema, no cyanosis    The results of significant diagnostics from this hospitalization (including imaging, microbiology, ancillary and laboratory) are listed below for reference.     Microbiology: Recent Results (from the past 240 hour(s))  Culture, blood (Routine X 2) w Reflex to ID Panel     Status: None   Collection Time: 05/24/21  8:56 AM   Specimen: BLOOD  Result Value Ref Range Status   Specimen Description BLOOD RIGHT ANTECUBITAL  Final   Special Requests   Final    BOTTLES DRAWN AEROBIC AND ANAEROBIC Blood Culture results may not be optimal due to an inadequate volume of blood received in culture bottles   Culture   Final    NO GROWTH 5 DAYS Performed at [facility], [address] [place], [place] [address]    Report Status 05/29/2021 FINAL  Final  Culture, blood (Routine X 2) w Reflex to ID Panel     Status: None   Collection Time: 05/24/21  8:56 AM   Specimen: BLOOD RIGHT HAND  Result Value Ref Range Status   Specimen Description BLOOD RIGHT HAND  Final   Special Requests   Final    BOTTLES DRAWN AEROBIC ONLY Blood Culture results may not be optimal due to an inadequate volume of blood received in culture bottles   Culture   Final    NO GROWTH 5 DAYS Performed at [facility], [address], [place], [place] [address]    Report Status 05/29/2021 FINAL  Final     Labs: BNP (last 3 results) No results for input(s): BNP in the last 8760 hours. Basic Metabolic Panel: Recent Labs  Lab 05/24/21 1026 05/25/21 0350 05/26/21 0602 05/27/21 0456 05/28/21 0138  NA 132* 131* 128* 132* 135  K 4.7 4.5 4.3 4.2 4.1  CL 97* 96* 95* 96* 101  CO2 GLUCOSE 164* 167* 128* 122* 100*  BUN 33* 26* 20 20 15  CREATININE 0.66 0.58 0.58 0.65 0.63  CALCIUM 8.6* 8.6* 8.5* 8.6* 8.6*   Liver Function Tests: Recent Labs  Lab 05/24/21 1026  AST 32  ALT  33  ALKPHOS 62  BILITOT 0.6  PROT 6.8  ALBUMIN 3.3*   No results for input(s): LIPASE, AMYLASE in the last 168 hours. No results for input(s): AMMONIA in the last 168 hours. CBC: Recent Labs  Lab 05/24/21 0227 05/25/21 0350 05/26/21 0602  WBC 16.5* 17.4* 16.6*  HGB 12.9 13.6 12.7  HCT 39.8 41.3 37.7  MCV 99.0 96.7 95.7  PLT 245 263 266   Cardiac Enzymes: No results for input(s): CKTOTAL, CKMB, CKMBINDEX, TROPONINI in the last 168 hours. BNP: Invalid input(s): POCBNP CBG: Recent Labs  Lab 05/29/21 1328 05/29/21 1819 05/29/21 2017 05/29/21 2314 05/30/21 0353  GLUCAP 86 86 109* 71 97   D-Dimer No results for input(s): DDIMER in the last 72 hours. Hgb A1c No results for input(s): HGBA1C in the last 72 hours. Lipid Profile No results for input(s): CHOL, HDL, LDLCALC, TRIG, CHOLHDL, LDLDIRECT in the last 72 hours.  Thyroid function studies No results for input(s): TSH, T4TOTAL, T3FREE, THYROIDAB in the last 72 hours.  Invalid input(s): FREET3 Anemia work up No results for input(s): VITAMINB12, FOLATE, FERRITIN, TIBC, IRON, RETICCTPCT in the last 72 hours. Urinalysis    Component Value Date/Time   COLORURINE YELLOW 05/24/2021 0805   APPEARANCEUR CLEAR 05/24/2021 0805   LABSPEC 1.028 05/24/2021 0805   PHURINE 5.0 05/24/2021 0805   GLUCOSEU 50 (A) 05/24/2021 0805   HGBUR NEGATIVE 05/24/2021 0805   BILIRUBINUR NEGATIVE 05/24/2021 0805   KETONESUR NEGATIVE 05/24/2021 0805   PROTEINUR NEGATIVE 05/24/2021 0805   NITRITE NEGATIVE 05/24/2021 0805   LEUKOCYTESUR NEGATIVE 05/24/2021 0805   Sepsis Labs Invalid input(s): PROCALCITONIN,  WBC,  LACTICIDVEN Microbiology Recent Results (from the past 240 hour(s))  Culture, blood (Routine X 2) w Reflex to ID Panel     Status: None   Collection Time: 05/24/21  8:56 AM   Specimen: BLOOD  Result Value Ref Range Status   Specimen Description BLOOD RIGHT ANTECUBITAL  Final   Special Requests   Final    BOTTLES DRAWN AEROBIC  AND ANAEROBIC Blood Culture results may not be optimal due to an inadequate volume of blood received in culture bottles   Culture   Final    NO GROWTH 5 DAYS Performed at [facility], [address] [place], [place] [address]    Report Status 05/29/2021 FINAL  Final  Culture, blood (Routine X 2) w Reflex to ID Panel     Status: None   Collection Time: 05/24/21  8:56 AM   Specimen: BLOOD RIGHT HAND  Result Value Ref Range Status   Specimen Description BLOOD RIGHT HAND  Final   Special Requests   Final    BOTTLES DRAWN AEROBIC ONLY Blood Culture results may not be optimal due to an inadequate volume of blood received in culture bottles   Culture   Final    NO GROWTH 5 DAYS Performed at [facility], [address] [place], [place] [address]    Report Status 05/29/2021 FINAL  Final     Time coordinating discharge: Over 30 minutes  SIGNED:   Marinda ElkAbraham Feliz Ortiz, MD  [facility] 05/30/2021, 7:30 AM Pager   If 7PM-7AM, please contact night-coverage [email or web address] Password [email or web address]

## 2021-05-28 NOTE — Plan of Care (Signed)
  Problem: Education: Goal: Knowledge of disease or condition will improve Outcome: Progressing Goal: Knowledge of secondary prevention will improve Outcome: Progressing Goal: Knowledge of patient specific risk factors addressed and post discharge goals established will improve Outcome: Progressing   Problem: Education: Goal: Knowledge of secondary prevention will improve Outcome: Progressing Goal: Knowledge of patient specific risk factors addressed and post discharge goals established will improve Outcome: Progressing Goal: Individualized Educational Video(s) Outcome: Progressing   Problem: Coping: Goal: Will verbalize positive feelings about self Outcome: Progressing Goal: Will identify appropriate support needs Outcome: Progressing   Problem: Health Behavior/Discharge Planning: Goal: Ability to manage health-related needs will improve Outcome: Progressing   Problem: Self-Care: Goal: Ability to participate in self-care as condition permits will improve Outcome: Progressing Goal: Verbalization of feelings and concerns over difficulty with self-care will improve Outcome: Progressing Goal: Ability to communicate needs accurately will improve Outcome: Progressing   Problem: Nutrition: Goal: Risk of aspiration will decrease Outcome: Progressing Goal: Dietary intake will improve Outcome: Progressing   Problem: Intracerebral Hemorrhage Tissue Perfusion: Goal: Complications of Intracerebral Hemorrhage will be minimized Outcome: Progressing   Problem: Safety: Goal: Non-violent Restraint(s) Outcome: Progressing   Problem: Education: Goal: Knowledge of General Education information will improve Description: Including pain rating scale, medication(s)/side effects and non-pharmacologic comfort measures Outcome: Progressing   Problem: Health Behavior/Discharge Planning: Goal: Ability to manage health-related needs will improve Outcome: Progressing   Problem: Clinical  Measurements: Goal: Ability to maintain clinical measurements within normal limits will improve Outcome: Progressing Goal: Will remain free from infection Outcome: Progressing Goal: Diagnostic test results will improve Outcome: Progressing Goal: Respiratory complications will improve Outcome: Progressing Goal: Cardiovascular complication will be avoided Outcome: Progressing   Problem: Activity: Goal: Risk for activity intolerance will decrease Outcome: Progressing   Problem: Nutrition: Goal: Adequate nutrition will be maintained Outcome: Progressing   Problem: Coping: Goal: Level of anxiety will decrease Outcome: Progressing   Problem: Elimination: Goal: Will not experience complications related to bowel motility Outcome: Progressing Goal: Will not experience complications related to urinary retention Outcome: Progressing   Problem: Pain Managment: Goal: General experience of comfort will improve Outcome: Progressing   Problem: Safety: Goal: Ability to remain free from injury will improve Outcome: Progressing   Problem: Skin Integrity: Goal: Risk for impaired skin integrity will decrease Outcome: Progressing

## 2021-05-29 LAB — GLUCOSE, CAPILLARY
Glucose-Capillary: 109 mg/dL — ABNORMAL HIGH (ref 70–99)
Glucose-Capillary: 71 mg/dL (ref 70–99)
Glucose-Capillary: 84 mg/dL (ref 70–99)
Glucose-Capillary: 86 mg/dL (ref 70–99)
Glucose-Capillary: 86 mg/dL (ref 70–99)
Glucose-Capillary: 88 mg/dL (ref 70–99)
Glucose-Capillary: 89 mg/dL (ref 70–99)

## 2021-05-29 LAB — CULTURE, BLOOD (ROUTINE X 2)
Culture: NO GROWTH
Culture: NO GROWTH

## 2021-05-29 NOTE — Progress Notes (Addendum)
IP rehab admissions - I have received approval for acute inpatient rehab admission.  I will have a rehab bed tomorrow, Saturday, and will plan to admit tomorrow.  Dr. Wynn Banker will assess patient Saturday morning.  Please call [phone] after 12 noon for report and to confirm admission.  Call for questions.  [phone]

## 2021-05-29 NOTE — Progress Notes (Signed)
[facility] PROGRESS NOTE    Progress Note  Marissa Morris  ZOX:[id number] DOB: 03-14-1961 DOA: 05/13/2021 PCP: Pcp, No     Brief Narrative:   Marissa Morris is an 60 y.o. 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 Significantly more awake this morning tolerating her breakfast. Repeated CT of the head on 05/21/2021 showed decrease interventricular clots. Continue Keppra for seizure prophylaxis. Blood pressure relatively stable on hydralazine, amlodipine labetalol lisinopril, she refused her labetalol yesterday. Physical therapy evaluated the patient and recommended CIR.  Hydrocephalus Repeated CT on 05/18/2021 show mild decrease in ventricular size she is status post removal of her intraventricular shunt on 05/22/2021. Repeated CT scan on 05/16/2021 showed improved IVH with stable ventricular size.  Hypertensive emergency: Off drips now on amlodipine, hydralazine, lisinopril and labetalol. Blood pressure relatively well controlled ranging from 120-145/75  Encephalopathy due to hemorrhagic CVA versus underlying infection: He was started empirically on modafinil on 05/25/2021 due to lethargy, it was discussed with neurology if not effective can be decreased to 200 mg p.o. daily.  Nutrition: Core track discontinued, she is currently on a regular diet. No signs of aspiration tolerating it well.  Fever and leukocytosis: Work-up has been negative, culture, UA, chest x-ray abdominal  x-ray. Antibiotics were discontinued on 05/21/2021, since then she has remained afebrile. T-max of 99.4 mild leukocytosis but is slowly improving off antibiotics.  Likely stress margination.  Chronic grade 2 diastolic heart failure: Appears to be on the dry side she was started on IV fluids.  Hypovolemic hyponatremia: Free water flushes were stopped she was started on normal saline and her sodium is slowly improving. Will continue IV fluids for 1 additional day.  Hyperlipidemia: Holding statin for now.  Hyperglycemia, With an A1c of 5.7 will need to f be reevaluated in the future.  Vaginal discharge: Now resolved.   DVT prophylaxis: SCD Family Communication:no at bedside Status is: Inpatient  Remains inpatient appropriate because:Hemodynamically unstable  Dispo: The patient is from: Home              Anticipated d/c is to: Awaiting CIR placement.              Patient currently is not medically stable to d/c.   Difficult to place patient No        Code Status:     Code Status Orders  (From admission, onward)           Start     Ordered   05/14/21 0018  Full code  Continuous        05/14/21 0025           Code Status History     This patient has a current code status but no historical code status.         IV Access:   Peripheral IV   Procedures and diagnostic studies:   DG Swallowing Func-Speech Pathology  Result Date: 05/27/2021 Formatting of this result is different from the original. Objective Swallowing Evaluation: Type of Study: MBS-Modified Barium Swallow Study  Patient Details Name: Marissa Morris MRN:  [id number] Date of Birth: 1961-03-26 Today's Date: 05/27/2021 Time: SLP Start Time (ACUTE ONLY): 9622 -SLP Stop Time (ACUTE ONLY): 0945 SLP Time Calculation (min) (ACUTE ONLY): 20 min Past Medical History: No past medical history on file. Past Surgical History: No past surgical history on file. HPI: Marissa Morris is a 60 y.o. female presenting to  [facility] in transfer from [facility] for management of acute intracranial hemorrhage. 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 Moderate chronic microvascular ischemic changes. No PMH on file.  Subjective: alert, cooperative Assessment / Plan / Recommendation CHL IP CLINICAL IMPRESSIONS 05/27/2021 Clinical Impression Pt presented with resolved dysphagia and a normal oropharyngeal swallow.  There was adequate oral control and bolus manipulation/propulsion, timely swallow trigger, reliable laryngeal vestibule closure, no penetration/aspiration, good pharyngeal stripping with no residue post-swallow.  When taxed with mixed solid/thin liquid consistencies, pt continued to protect her airway.  She swallowed a 13 mm barium pill with liquid without difficulty.  There is no reason to maintain cortrak at this point.  Recommend D/C TF and initiate a regular diet with thin liquids. D/W Dr. Radonna Ricker. SLP Visit Diagnosis Dysphagia, unspecified (R13.10) Attention and concentration deficit following -- Frontal lobe and executive function deficit following -- Impact on safety and function No limitations   CHL IP TREATMENT RECOMMENDATION 05/27/2021 Treatment Recommendations Therapy as outlined in treatment plan below   Prognosis 05/15/2021 Prognosis for Safe Diet Advancement Good Barriers to Reach Goals -- Barriers/Prognosis Comment -- CHL IP DIET RECOMMENDATION 05/27/2021 SLP Diet Recommendations Regular solids;Thin liquid Liquid Administration via Straw;Cup Medication Administration Whole meds with liquid Compensations Minimize environmental distractions Postural Changes --   CHL IP OTHER RECOMMENDATIONS 05/27/2021 Recommended Consults -- Oral Care Recommendations Oral care BID Other Recommendations  --   CHL IP FOLLOW UP RECOMMENDATIONS 05/27/2021 Follow up Recommendations Inpatient Rehab   CHL IP FREQUENCY AND DURATION 05/27/2021 Speech Therapy Frequency (ACUTE ONLY) min 2x/week Treatment Duration 2 weeks      CHL IP ORAL PHASE 05/27/2021 Oral Phase WFL Oral - Pudding Teaspoon -- Oral - Pudding Cup -- Oral - Honey Teaspoon -- Oral - Honey Cup -- Oral - Nectar Teaspoon -- Oral - Nectar Cup -- Oral - Nectar Straw -- Oral - Thin Teaspoon -- Oral - Thin Cup -- Oral - Thin Straw -- Oral - Puree -- Oral - Mech Soft -- Oral - Regular -- Oral - Multi-Consistency -- Oral - Pill -- Oral Phase - Comment --  CHL IP PHARYNGEAL PHASE 05/27/2021 Pharyngeal Phase WFL Pharyngeal- Pudding Teaspoon -- Pharyngeal -- Pharyngeal- Pudding Cup -- Pharyngeal -- Pharyngeal- Honey Teaspoon -- Pharyngeal -- Pharyngeal- Honey Cup -- Pharyngeal -- Pharyngeal- Nectar Teaspoon -- Pharyngeal -- Pharyngeal- Nectar Cup -- Pharyngeal -- Pharyngeal- Nectar Straw -- Pharyngeal -- Pharyngeal- Thin Teaspoon -- Pharyngeal -- Pharyngeal- Thin Cup -- Pharyngeal -- Pharyngeal- Thin Straw -- Pharyngeal -- Pharyngeal- Puree -- Pharyngeal -- Pharyngeal- Mechanical Soft -- Pharyngeal -- Pharyngeal- Regular -- Pharyngeal -- Pharyngeal- Multi-consistency -- Pharyngeal -- Pharyngeal- Pill -- Pharyngeal -- Pharyngeal Comment --  No flowsheet data found. Blenda Mounts Laurice 05/27/2021, 9:52 AM                Medical Consultants:   None.   Subjective:    Marissa Morris more awake this morning in a good mood tolerating her  breakfast.  Objective:    Vitals:   05/28/21 2011 05/29/21 0008 05/29/21 0407 05/29/21 0708  BP: 140/70 (!) 143/78 120/81 139/76  Pulse: 73 67    Resp:  14 14   Temp: 98.3 F (36.8 C) 98.6 F (37 C) 98.3 F (36.8 C)   TempSrc: Oral Axillary Axillary   SpO2: 98% 96% 98%   Weight:   75.2 kg    SpO2: 98 % O2 Flow Rate (L/min): 2 L/min   Intake/Output Summary (Last 24 hours) at 05/29/2021 0915 Last data filed at  05/29/2021 0600 Gross per 24 hour  Intake --  Output 700 ml  Net -700 ml    Filed Weights   05/27/21 0500 05/28/21 0416 05/29/21 0407  Weight: 75.9 kg 76.2 kg 75.2 kg    Exam: General exam: In no acute distress. Respiratory system: Good air movement and clear to auscultation. Cardiovascular system: S1 & S2 heard, RRR. No JVD. Gastrointestinal system: Abdomen is nondistended, soft and nontender.  Extremities: No pedal edema. Skin: No rashes, lesions or ulcers  Data Reviewed:    Labs: Basic Metabolic Panel: Recent Labs  Lab 05/24/21 1026 05/25/21 0350 05/26/21 0602 05/27/21 0456 05/28/21 0138  NA 132* 131* 128* 132* 135  K 4.7 4.5 4.3 4.2 4.1  CL 97* 96* 95* 96* 101  CO2 24 27 27 28 26   GLUCOSE 164* 167* 128* 122* 100*  BUN 33* 26* 20 20 15   CREATININE 0.66 0.58 0.58 0.65 0.63  CALCIUM 8.6* 8.6* 8.5* 8.6* 8.6*    GFR CrCl cannot be calculated (Unknown ideal weight.). Liver Function Tests: Recent Labs  Lab 05/24/21 1026  AST 32  ALT 33  ALKPHOS 62  BILITOT 0.6  PROT 6.8  ALBUMIN 3.3*    No results for input(s): LIPASE, AMYLASE in the last 168 hours. No results for input(s): AMMONIA in the last 168 hours. Coagulation profile No results for input(s): INR, PROTIME in the last 168 hours. COVID-19 Labs  No results for input(s): DDIMER, FERRITIN, LDH, CRP in the last 72 hours.  Lab Results  Component Value Date   SARSCOV2NAA NEGATIVE 05/13/2021    CBC: Recent Labs  Lab 05/23/21 0717 05/24/21 0227 05/25/21 0350 05/26/21 0602  WBC 14.3* 16.5* 17.4* 16.6*  HGB 12.9 12.9 13.6 12.7  HCT 40.0 39.8 41.3 37.7  MCV 97.6 99.0 96.7 95.7  PLT 246 245 263 266    Cardiac Enzymes: No results for input(s): CKTOTAL, CKMB, CKMBINDEX, TROPONINI in the last 168 hours. BNP (last 3 results) No results for input(s): PROBNP in the last 8760 hours. CBG: Recent Labs  Lab 05/28/21 0433 05/28/21 2102 05/29/21 0022 05/29/21 0516 05/29/21 0807  GLUCAP 96 95 89  88 84    D-Dimer: No results for input(s): DDIMER in the last 72 hours. Hgb A1c: No results for input(s): HGBA1C in the last 72 hours. Lipid Profile: No results for input(s): CHOL, HDL, LDLCALC, TRIG, CHOLHDL, LDLDIRECT in the last 72 hours.  Thyroid function studies: No results for input(s): TSH, T4TOTAL, T3FREE, THYROIDAB in the last 72 hours.  Invalid input(s): FREET3 Anemia work up: No results for input(s): VITAMINB12, FOLATE, FERRITIN, TIBC, IRON, RETICCTPCT in the last 72 hours. Sepsis Labs: Recent Labs  Lab 05/23/21 0717 05/24/21 0227 05/25/21 0350 05/26/21 0602  WBC 14.3* 16.5* 17.4* 16.6*    Microbiology Recent Results (from the past 240 hour(s))  Culture, blood (Routine X 2) w Reflex to ID Panel     Status: None   Collection Time: 05/24/21  8:56 AM   Specimen: BLOOD  Result Value Ref Range Status   Specimen Description BLOOD RIGHT ANTECUBITAL  Final   Special Requests   Final    BOTTLES DRAWN AEROBIC AND ANAEROBIC Blood Culture results may not be optimal due to an inadequate volume of blood received in culture bottles   Culture   Final    NO GROWTH 5 DAYS Performed at [facility], [address], [place], [place] [address]    Report Status 05/29/2021 FINAL  Final  Culture, blood (Routine X 2) w Reflex to ID Panel     Status: None   Collection Time: 05/24/21  8:56 AM   Specimen: BLOOD RIGHT HAND  Result Value Ref Range Status   Specimen Description BLOOD RIGHT HAND  Final   Special Requests   Final    BOTTLES DRAWN AEROBIC ONLY Blood Culture results may not be optimal due to an inadequate volume of blood received in culture bottles   Culture   Final    NO GROWTH 5 DAYS Performed at [facility], [address] [place], [place] [address]    Report Status 05/29/2021 FINAL  Final     Medications:    amLODipine  10 mg Oral Daily   chlorhexidine  15 mL Mouth Rinse BID   Chlorhexidine Gluconate Cloth  6 each Topical Daily   heparin  5,000  Units Subcutaneous Q8H   hydrALAZINE  20 mg Oral Q8H   insulin aspart  0-6 Units Subcutaneous Q4H   labetalol  200 mg Oral TID   lisinopril  40 mg Oral Daily   magic mouthwash  1 mL Oral TID   mouth rinse  15 mL Mouth Rinse q12n4p   modafinil  100 mg Oral Daily   senna-docusate  1 tablet Oral BID   Continuous Infusions:      LOS: 15 days   Marinda Elk  [facility]  05/29/2021, 9:15 AM

## 2021-05-29 NOTE — Progress Notes (Signed)
Physical Therapy Treatment Patient Details Name: Marissa Morris MRN: [id number] DOB: October 28, 1961 Today's Date: 05/29/2021    History of Present Illness 60 y.o. female presenting to [facility] in transfer from [facility] for management of acute intracranial hemorrhage. CT revealed a 3 cm thalamic hemorrahge with intraventricular extension. 6/3 interval placement of R frontal EVD; removed 6/10.  modafinil on 05/25/2021 No significant PMH.    PT Comments    Patient more interactive this session compared to previous. Patient with improved R visual scanning across midline with cueing. Patient continues to demonstrate R inattention with attempts at mobilizing. Patient requires maxA+2 for sit to stand transfer with [facility]. Easily fatigued this session. Continue to recommend comprehensive inpatient rehab (CIR) for post-acute therapy needs.     Follow Up Recommendations  CIR     Equipment Recommendations  3in1 (PT);Wheelchair (measurements PT);Wheelchair cushion (measurements PT)    Recommendations for Other Services       Precautions / Restrictions Precautions Precautions: Fall Restrictions Weight Bearing Restrictions: No    Mobility  Bed Mobility Overal bed mobility: Needs Assistance Bed Mobility: Rolling;Sidelying to Sit;Sit to Supine Rolling: Mod assist Sidelying to sit: Max assist   Sit to supine: Max assist;+2 for physical assistance   General bed mobility comments: Max A to bring BLEs over EOB and elevate trunk. Pt pushing back into sidelying to R and requiring cues for maintaining midline. Max A +2 for returning to bed and supine.    Transfers Overall transfer level: Needs assistance Equipment used: Ambulation equipment used Transfers: Sit to/from Stand Sit to Stand: Max assist;+2 physical assistance;+2 safety/equipment;From elevated surface         General transfer comment: maxA+2 to power up into standing in [place]. Sit to stand x 4 during session with  increased time for rest break in between  Ambulation/Gait                 Stairs             Wheelchair Mobility    Modified Rankin (Stroke Patients Only) Modified Rankin (Stroke Patients Only) Pre-Morbid Rankin Score: No symptoms Modified Rankin: Severe disability     Balance Overall balance assessment: Needs assistance Sitting-balance support: Single extremity supported;Feet supported Sitting balance-Leahy Scale: Poor   Postural control: Right lateral lean Standing balance support: Single extremity supported;During functional activity Standing balance-Leahy Scale: Poor Standing balance comment: Physical A for maintaining sitting balance                            Cognition Arousal/Alertness: Awake/alert Behavior During Therapy: WFL for tasks assessed/performed Overall Cognitive Status: Impaired/Different from baseline Area of Impairment: Orientation;Attention;Memory;Following commands;Safety/judgement;Awareness;Problem solving                 Orientation Level: Disoriented to;Place;Time;Situation Current Attention Level: Sustained Memory: Decreased recall of precautions;Decreased short-term memory Following Commands: Follows one step commands inconsistently;Follows one step commands with increased time Safety/Judgement: Decreased awareness of safety;Decreased awareness of deficits Awareness: Intellectual Problem Solving: Slow processing;Difficulty sequencing;Requires verbal cues;Requires tactile cues General Comments: pt following 1 step commands 50% of session with delays to respond; requiring increased time for processing. No verbalization this session. Poor awareness and attention to R side of body or environment.      Exercises      General Comments        Pertinent Vitals/Pain Pain Assessment: Faces Faces Pain Scale: No hurt Pain Intervention(s): Monitored during session    Home  Living                      Prior  Function            PT Goals (current goals can now be found in the care plan section) Acute Rehab PT Goals Patient Stated Goal: to go to rehab PT Goal Formulation: With patient/family Time For Goal Achievement: 05/30/21 Potential to Achieve Goals: Good Progress towards PT goals: Progressing toward goals    Frequency    Min 4X/week      PT Plan Current plan remains appropriate    Co-evaluation              AM-PAC PT "6 Clicks" Mobility   Outcome Measure  Help needed turning from your back to your side while in a flat bed without using bedrails?: A Lot Help needed moving from lying on your back to sitting on the side of a flat bed without using bedrails?: Total Help needed moving to and from a bed to a chair (including a wheelchair)?: Total Help needed standing up from a chair using your arms (e.g., wheelchair or bedside chair)?: Total Help needed to walk in hospital room?: Total Help needed climbing 3-5 steps with a railing? : Total 6 Click Score: 7    End of Session Equipment Utilized During Treatment: Gait belt Activity Tolerance: Patient tolerated treatment well Patient left: in bed;with call bell/phone within reach;with bed alarm set Nurse Communication: Mobility status PT Visit Diagnosis: Unsteadiness on feet (R26.81);Hemiplegia and hemiparesis;Muscle weakness (generalized) (M62.81);Difficulty in walking, not elsewhere classified (R26.2);Other symptoms and signs involving the nervous system (R29.898) Hemiplegia - Right/Left: Right Hemiplegia - dominant/non-dominant: Dominant Hemiplegia - caused by: Other Nontraumatic intracranial hemorrhage     Time: 1431-1458 PT Time Calculation (min) (ACUTE ONLY): 27 min  Charges:  $Therapeutic Activity: 23-37 mins                     Bekah Igoe A. Dan Humphreys PT, DPT Acute Rehabilitation Services Pager [phone] Office [phone]    Viviann Spare 05/29/2021, 5:09 PM

## 2021-05-29 NOTE — Progress Notes (Signed)
  Speech Language Pathology Treatment: Dysphagia;Cognitive-Linquistic  Patient Details Name: Marissa Morris MRN: [id number] DOB: 12-Sep-1961 Today's Date: 05/29/2021 Time: 4627-0350 SLP Time Calculation (min) (ACUTE ONLY): 26.1 min  Assessment / Plan / Recommendation Clinical Impression  Swallowing: Pt is doing very well with regular diet. She needs assist with tray-set up due to apraxia and comprehension deficits, however her swallowing is significantly improved.  She is tolerating POs with no concerns for aspiration and normal oral phase.  No further SLP f/u for dysphagia is needed.  Aphasia: Pt continues to make excellent gains with communication.  She demonstrates improved ability to follow simple commands, particularly when context is predictable (during ADLs, for example).  When verbal command is novel, comprehension deficits are revealed.  Pt shows improved ability to discriminate objects from choice of three - written word assists with success (60% accuracy).  Confrontational naming requires verbal model + written word initially - pt able to repeat and read single words aloud, a significant improvement.  She was able to shift between production of "fork" and "spoon" and cease perseveratory response with max verbal/written cues.  Social communication("how are you") is becoming increasingly spontaneous and accurate.    Pt is making excellent gains overall and is a very appropriate candidate for CIR.  Will continue to follow for aphasia.    HPI HPI: Marissa Morris is a 60 y.o. female presenting to [facility] in transfer from [facility] for management of acute intracranial hemorrhage. 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 Moderate chronic microvascular  ischemic changes. No PMH on file. MBS 6/15 normal swallow; cortrak D/Cd.      SLP Plan  Continue with current plan of care;Other (Comment) (swallow goals met)       Recommendations  Diet recommendations: Regular;Thin liquid Liquids provided via: Straw;Cup Medication Administration: Whole meds with liquid Supervision:  (assist with tray set up) Compensations: Minimize environmental distractions Postural Changes and/or Swallow Maneuvers: Seated upright 90 degrees                Oral Care Recommendations: Oral care BID Follow up Recommendations: Inpatient Rehab SLP Visit Diagnosis: Aphasia (R47.01) Plan: Continue with current plan of care;Other (Comment) (swallow goals met)       GO               Marissa Morris L. Tivis Ringer, [place] Office number [phone] Pager [phone]  Marissa Morris 05/29/2021, 9:59 AM

## 2021-05-30 ENCOUNTER — Inpatient Hospital Stay (HOSPITAL_COMMUNITY)
Admission: RE | Admit: 2021-05-30 | Discharge: 2021-06-25 | DRG: 057 | Disposition: A | Discharge status: Home Health | Payer: BLUE CROSS/BLUE SHIELD | Source: Intra-hospital | Attending: Physical Medicine & Rehabilitation | Admitting: Physical Medicine & Rehabilitation

## 2021-05-30 ENCOUNTER — Inpatient Hospital Stay
Admission: AD | Admit: 2021-05-30 | Payer: BLUE CROSS/BLUE SHIELD | Source: Other Acute Inpatient Hospital | Admitting: Neurology

## 2021-05-30 ENCOUNTER — Encounter (HOSPITAL_COMMUNITY): Payer: Self-pay | Admitting: Physical Medicine & Rehabilitation

## 2021-05-30 ENCOUNTER — Other Ambulatory Visit: Payer: Self-pay

## 2021-05-30 DIAGNOSIS — I10 Essential (primary) hypertension: Secondary | ICD-10-CM | POA: Diagnosis present

## 2021-05-30 DIAGNOSIS — I69151 Hemiplegia and hemiparesis following nontraumatic intracerebral hemorrhage affecting right dominant side: Principal | ICD-10-CM

## 2021-05-30 DIAGNOSIS — I69192 Facial weakness following nontraumatic intracerebral hemorrhage: Secondary | ICD-10-CM

## 2021-05-30 DIAGNOSIS — M25561 Pain in right knee: Secondary | ICD-10-CM

## 2021-05-30 DIAGNOSIS — I610 Nontraumatic intracerebral hemorrhage in hemisphere, subcortical: Secondary | ICD-10-CM | POA: Diagnosis present

## 2021-05-30 DIAGNOSIS — G890 Central pain syndrome: Secondary | ICD-10-CM | POA: Diagnosis present

## 2021-05-30 DIAGNOSIS — M792 Neuralgia and neuritis, unspecified: Secondary | ICD-10-CM | POA: Diagnosis not present

## 2021-05-30 DIAGNOSIS — E871 Hypo-osmolality and hyponatremia: Secondary | ICD-10-CM | POA: Diagnosis present

## 2021-05-30 DIAGNOSIS — M1711 Unilateral primary osteoarthritis, right knee: Secondary | ICD-10-CM | POA: Diagnosis present

## 2021-05-30 DIAGNOSIS — I69120 Aphasia following nontraumatic intracerebral hemorrhage: Secondary | ICD-10-CM

## 2021-05-30 DIAGNOSIS — R739 Hyperglycemia, unspecified: Secondary | ICD-10-CM | POA: Diagnosis present

## 2021-05-30 DIAGNOSIS — G919 Hydrocephalus, unspecified: Secondary | ICD-10-CM | POA: Diagnosis present

## 2021-05-30 DIAGNOSIS — Z79899 Other long term (current) drug therapy: Secondary | ICD-10-CM

## 2021-05-30 DIAGNOSIS — R531 Weakness: Secondary | ICD-10-CM | POA: Diagnosis present

## 2021-05-30 LAB — GLUCOSE, CAPILLARY
Glucose-Capillary: 97 mg/dL (ref 70–99)
Glucose-Capillary: 88 mg/dL (ref 70–99)

## 2021-05-30 MED ORDER — AMLODIPINE BESYLATE 10 MG PO TABS
10.0000 mg | ORAL_TABLET | Freq: Every day | ORAL | Status: DC
  Administered 2021-05-31 – 2021-06-25 (×26): 10 mg via ORAL
  Filled 2021-05-30 (×26): qty 1

## 2021-05-30 MED ORDER — LISINOPRIL 20 MG PO TABS
40.0000 mg | ORAL_TABLET | Freq: Every day | ORAL | Status: DC
  Administered 2021-05-31 – 2021-06-25 (×26): 40 mg via ORAL
  Filled 2021-05-30 (×20): qty 2
  Filled 2021-05-30: qty 1
  Filled 2021-05-30 (×5): qty 2

## 2021-05-30 MED ORDER — MODAFINIL 100 MG PO TABS
100.0000 mg | ORAL_TABLET | Freq: Every day | ORAL | Status: DC
  Administered 2021-05-31 – 2021-06-25 (×26): 100 mg via ORAL
  Filled 2021-05-30 (×26): qty 1

## 2021-05-30 MED ORDER — LABETALOL HCL 100 MG PO TABS
200.0000 mg | ORAL_TABLET | Freq: Three times a day (TID) | ORAL | Status: DC
  Administered 2021-05-30 – 2021-06-25 (×77): 200 mg via ORAL
  Filled 2021-05-30 (×72): qty 2
  Filled 2021-05-30: qty 1
  Filled 2021-05-30 (×2): qty 2
  Filled 2021-05-30: qty 1
  Filled 2021-05-30: qty 2

## 2021-05-30 MED ORDER — ENSURE ENLIVE PO LIQD
237.0000 mL | Freq: Two times a day (BID) | ORAL | Status: DC
  Administered 2021-05-31 – 2021-06-24 (×36): 237 mL via ORAL
  Filled 2021-05-30: qty 237

## 2021-05-30 MED ORDER — HEPARIN SODIUM (PORCINE) 5000 UNIT/ML IJ SOLN
5000.0000 [IU] | Freq: Three times a day (TID) | INTRAMUSCULAR | Status: DC
  Administered 2021-05-30 – 2021-06-25 (×77): 5000 [IU] via SUBCUTANEOUS
  Filled 2021-05-30 (×77): qty 1

## 2021-05-30 MED ORDER — SENNOSIDES-DOCUSATE SODIUM 8.6-50 MG PO TABS
1.0000 | ORAL_TABLET | Freq: Two times a day (BID) | ORAL | Status: DC
  Administered 2021-05-30 – 2021-06-25 (×51): 1 via ORAL
  Filled 2021-05-30 (×53): qty 1

## 2021-05-30 MED ORDER — HEPARIN SODIUM (PORCINE) 5000 UNIT/ML IJ SOLN: 5000.0000 [IU] | Freq: Three times a day (TID) | INTRAMUSCULAR | Status: DC

## 2021-05-30 MED ORDER — HYDRALAZINE HCL 10 MG PO TABS
20.0000 mg | ORAL_TABLET | Freq: Three times a day (TID) | ORAL | Status: DC
  Administered 2021-05-30 – 2021-06-25 (×77): 20 mg via ORAL
  Filled 2021-05-30 (×78): qty 2

## 2021-05-30 NOTE — H&P (Signed)
Physical Medicine and Rehabilitation Admission H&P         Chief Complaint  Patient presents with   Cerebrovascular Accident      hemorrhagic  : HPI: Marissa Morris is a 60 year old right-handed female with history of hypertension as well as back surgery.  Per chart review lives alone.  1 level home with ramped entrance.  Reportedly independent prior to admission.  She has a supportive fianc.  Presented 05/14/2021 on transfer from [facility] after presenting with right side weakness nausea vomiting facial droop with dysarthria.  Noted blood pressure 222/128.  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  Neurosurgery consulted Dr. Hoyt Koch and underwent right frontal ventriculostomy for hydrocephalus.  Patient was managed with hydralazine and nicardipine for blood pressure control.  Keppra was initially initiated for seizure prophylaxis and course completed.  Follow-up MRI/MRA showed no substantial change in thalamic hemorrhage with significant intraventricular extension mild hydrocephalus as well as mild edema and mass-effect compared to recent CT.  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  MRA was unremarkable and CT head completed 05/26/2021 showing interval removal of ventricular catheter with significant decrease in intraventricular hemorrhage.  No new hemorrhage or worsening of mass-effect..  She was cleared to begin subcutaneous heparin for DVT prophylaxis 05/18/2021.  Echocardiogram with ejection fraction of 50 to 55% grade 2 diastolic dysfunction.  Admission chemistries unremarkable except glucose 117 WBC 12,800 urinalysis negative urine drug screen negative.  Patient maintained on Cleviprex initially for blood pressure control.  She was initially n.p.o. with alternative means of nutritional support and diet has been advanced to regular.  Bouts of initial agitation restlessness receiving Zyprexa  via.  Patient with mild hyponatremia 128 132 since improved to 135 and monitored.  Therapy evaluations completed due to patient's right side weakness and dysarthria was admitted for a comprehensive rehab program.   Review of Systems Constitutional:  Negative for chills and fever. HENT:  Negative for hearing loss.   Eyes:  Negative for blurred vision and double vision. Respiratory:  Negative for cough and shortness of breath.   Cardiovascular:  Negative for chest pain, palpitations and leg swelling. Gastrointestinal:  Positive for nausea and vomiting. Genitourinary:  Negative for dysuria, flank pain and hematuria. Musculoskeletal:  Positive for joint pain and myalgias. Skin:  Negative for rash. Neurological:  Positive for speech change, weakness and headaches.  All other systems reviewed and are negative. History reviewed. No pertinent past medical history. History reviewed. No pertinent surgical history. No family history on file. Social History:  has no history on file for tobacco use, alcohol use, and drug use. Allergies:       Allergies  Allergen Reactions   Other Other (See Comments) and Cough      Pollen- Sneezing, also          Medications Prior to Admission  Medication Sig Dispense Refill   BAYER LOW DOSE 81 MG EC tablet Take 81 mg by mouth daily. Swallow whole.       Multiple Vitamins-Minerals (ONE-A-DAY WOMENS 50+) TABS Take 1 tablet by mouth daily.       vitamin C (ASCORBIC ACID) 500 MG tablet Take 500-1,000 mg by mouth daily.          Drug Regimen Review Drug regimen was reviewed and remains appropriate with no significant issues identified   Home: Home Living Family/patient expects to be discharged to:: Private residence Living Arrangements: Alone Available  Help at Discharge: Family, Friend(s) Type of Home: House Home Access: Ramped entrance Home Layout: One level Bathroom Shower/Tub: Engineer, manufacturing systems: Standard Bathroom Accessibility: Yes Home  Equipment: Bedside commode, Wheelchair - manual (bariatric w/c)   Functional History: Prior Function Level of Independence: Independent Comments: not working; assisting fiance as needed s/p his back surgeries.   Functional Status:  Mobility: Bed Mobility Overal bed mobility: Needs Assistance Bed Mobility: Rolling, Sidelying to Sit, Sit to Supine Rolling: Mod assist Sidelying to sit: Max assist Supine to sit: +2 for physical assistance, Max assist Sit to supine: Max assist, +2 for physical assistance General bed mobility comments: Max A to bring BLEs over EOB and elevate trunk. Pt pushing back into sidelying to R and requiring cues for maintaining midline. Max A +2 for returning to bed and supine. Transfers Overall transfer level: Needs assistance Equipment used: 2 person hand held assist Transfer via Lift Equipment: Stedy Transfers: Sit to/from [employer] to Stand: Max assist, +2 physical assistance, +2 safety/equipment, From elevated surface Stand pivot transfers: Max assist, +2 physical assistance, +2 safety/equipment Squat pivot transfers: Max assist, +2 physical assistance General transfer comment: Max A +2 to power up into standing Ambulation/Gait General Gait Details: unable   ADL: ADL Overall ADL's : Needs assistance/impaired Eating/Feeding: Minimal assistance, Bed level Eating/Feeding Details (indicate cue type and reason): needs food in L visual field Grooming: Wash/dry face, Sitting Grooming Details (indicate cue type and reason): While sitting in stedy at sink, pt washing her face with step by step simple cues (in quiet environment) and Mod A for sitting balance and support at RUE. Upper Body Bathing: Maximal assistance, Bed level, Sitting Lower Body Bathing: Total assistance Upper Body Dressing : Maximal assistance, Bed level Upper Body Dressing Details (indicate cue type and reason): Max A to don new gown; Max cues for engaging RUE into task. Poor attention to  R Lower Body Dressing: Maximal assistance, Sitting/lateral leans Lower Body Dressing Details (indicate cue type and reason): pt needs max cues to visual attend to feet. pt does not recognize a sock as a sock when asked. Pt with better (A) don sock with R LE being held in figure 4 due to L visual field Toilet Transfer: Maximal assistance, +2 for physical assistance (sit<>stand with stedy) Toilet Transfer Details (indicate cue type and reason): Max A +2 for sit<>stand with stedy Toileting- Clothing Manipulation and Hygiene: Total assistance Functional mobility during ADLs: Maximal assistance, +2 for physical assistance (sit<>stand with stedy) General ADL Comments: Upon arrival, pt soiled with urine in bed. Requring Max A for bathing and donning new gown. Use of stedy to perform sit<>stand and transition to sink for washing her face   Cognition: Cognition Overall Cognitive Status: Impaired/Different from baseline Arousal/Alertness: Lethargic Orientation Level: Oriented to person Attention: Sustained Sustained Attention: Impaired Sustained Attention Impairment: Functional basic, Verbal basic Memory:  (TBA) Awareness: Impaired Awareness Impairment: Emergent impairment Problem Solving:  (will assess further) Behaviors: Perseveration Safety/Judgment: Impaired Cognition Arousal/Alertness: Awake/alert Behavior During Therapy: WFL for tasks assessed/performed Overall Cognitive Status: Impaired/Different from baseline Area of Impairment: Orientation, Attention, Memory, Following commands, Safety/judgement, Awareness, Problem solving Orientation Level: Disoriented to, Place, Time, Situation Current Attention Level: Sustained Memory: Decreased recall of precautions, Decreased short-term memory Following Commands: Follows one step commands inconsistently, Follows one step commands with increased time Safety/Judgement: Decreased awareness of safety, Decreased awareness of deficits Awareness:  Intellectual Problem Solving: Slow processing, Difficulty sequencing, Requires verbal cues, Requires tactile cues General Comments: pt following 1 step  commands 50% of session with delays to respond; requiring increased time for processing. No verbalization this session.Poor awareness and attention to R side of body or environment. Pt leaning her head against her hands when fatigued. Difficult to assess due to: Level of arousal   Physical Exam: Blood pressure 120/81, pulse 67, temperature 98.3 F (36.8 C), temperature source Axillary, resp. rate 14, weight 75.2 kg, SpO2 98 %. Physical Exam Neurological:    Comments: Patient is alert.  Mood is a bit flat.  Makes eye contact with examiner.  She is pleasantly confused she was not able to provide her age or date or place.  She stated she was 60 years old and knew she lived in [place].  Follows simple commands.    General: No acute distress Mood and affect are appropriate Heart: Regular rate and rhythm no rubs murmurs or extra sounds Lungs: Clear to auscultation, breathing unlabored, no rales or wheezes Abdomen: Positive bowel sounds, soft nontender to palpation, nondistended Extremities: No clubbing, cyanosis, or edema Skin: No evidence of breakdown, no evidence of rash Neurologic: Right upper extremity 0/5 in the deltoid bicep tricep grip, left upper extremity is 4/5 in the left deltoid bicep tricep grip Right lower extremity is to minus at the hip flexor knee extensor 0 at the ankle dorsiflexor, left lower extremity is 4 - hip flexor knee extensor ankle dorsiflexor Sensation is reduced on the right side in the upper and lower limb  Cerebellar exam not tested on the right side due to weakness Musculoskeletal: Full range of motion in all 4 extremities. No joint swelling    Lab Results Last 48 Hours        Results for orders placed or performed during the hospital encounter of 05/13/21 (from the past 48 hour(s))  Glucose, capillary      Status: Abnormal    Collection Time: 05/27/21  7:48 AM  Result Value Ref Range    Glucose-Capillary 144 (H) 70 - 99 mg/dL      Comment: Glucose reference range applies only to samples taken after fasting for at least 8 hours.  Glucose, capillary     Status: Abnormal    Collection Time: 05/27/21 11:44 AM  Result Value Ref Range    Glucose-Capillary 109 (H) 70 - 99 mg/dL      Comment: Glucose reference range applies only to samples taken after fasting for at least 8 hours.  Glucose, capillary     Status: Abnormal    Collection Time: 05/27/21  8:25 PM  Result Value Ref Range    Glucose-Capillary 110 (H) 70 - 99 mg/dL      Comment: Glucose reference range applies only to samples taken after fasting for at least 8 hours.  Glucose, capillary     Status: None    Collection Time: 05/27/21 11:44 PM  Result Value Ref Range    Glucose-Capillary 99 70 - 99 mg/dL      Comment: Glucose reference range applies only to samples taken after fasting for at least 8 hours.  Basic metabolic panel     Status: Abnormal    Collection Time: 05/28/21  1:38 AM  Result Value Ref Range    Sodium 135 135 - 145 mmol/L    Potassium 4.1 3.5 - 5.1 mmol/L    Chloride 101 98 - 111 mmol/L    CO2 26 22 - 32 mmol/L    Glucose, Bld 100 (H) 70 - 99 mg/dL      Comment: Glucose reference range  applies only to samples taken after fasting for at least 8 hours.    BUN 15 6 - 20 mg/dL    Creatinine, Ser 1.610.63 0.44 - 1.00 mg/dL    Calcium 8.6 (L) 8.9 - 10.3 mg/dL    GFR, Estimated >09>60 >60>60 mL/min      Comment: (NOTE) Calculated using the CKD-EPI Creatinine Equation (2021)      Anion gap 8 5 - 15      Comment: Performed at [facility], [address] [place], [place] [id number]  Glucose, capillary     Status: None    Collection Time: 05/28/21  4:33 AM  Result Value Ref Range    Glucose-Capillary 96 70 - 99 mg/dL      Comment: Glucose reference range applies only to samples taken after fasting for at least 8 hours.   Glucose, capillary     Status: None    Collection Time: 05/28/21  9:02 PM  Result Value Ref Range    Glucose-Capillary 95 70 - 99 mg/dL      Comment: Glucose reference range applies only to samples taken after fasting for at least 8 hours.  Glucose, capillary     Status: None    Collection Time: 05/29/21 12:22 AM  Result Value Ref Range    Glucose-Capillary 89 70 - 99 mg/dL      Comment: Glucose reference range applies only to samples taken after fasting for at least 8 hours.  Glucose, capillary     Status: None    Collection Time: 05/29/21  5:16 AM  Result Value Ref Range    Glucose-Capillary 88 70 - 99 mg/dL      Comment: Glucose reference range applies only to samples taken after fasting for at least 8 hours.       Imaging Results (Last 48 hours)  DG Swallowing Func-Speech Pathology   Result Date: 05/27/2021 Formatting of this result is different from the original. Objective Swallowing Evaluation: Type of Study: MBS-Modified Barium Swallow Study  Patient Details Name: Hassan RowanDeloris Morris MRN: [id number] Date of Birth: [birth year] Today's Date: 05/27/2021 Time: SLP Start Time (ACUTE ONLY): 0925 -SLP Stop Time (ACUTE ONLY): 0945 SLP Time Calculation (min) (ACUTE ONLY): 20 min Past Medical History: No past medical history on file. Past Surgical History: No past surgical history on file. HPI: Ashayla Prichett is a 60 y.o. female [facility] for management of acute intracranial hemorrhage. 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 Moderate chronic microvascular ischemic changes. No PMH on file.  Subjective: alert, cooperative Assessment / Plan / Recommendation CHL IP CLINICAL IMPRESSIONS 05/27/2021 Clinical Impression Pt presented  with resolved dysphagia and a normal oropharyngeal swallow.  There was adequate oral control and bolus manipulation/propulsion, timely swallow trigger, reliable laryngeal vestibule closure, no penetration/aspiration, good pharyngeal stripping with no residue post-swallow.  When taxed with mixed solid/thin liquid consistencies, pt continued to protect her airway.  She swallowed a 13 mm barium pill with liquid without difficulty.  There is no reason to maintain cortrak at this point.  Recommend D/C TF and initiate a regular diet with thin liquids. D/W Dr. Radonna RickerFeliz. SLP Visit Diagnosis Dysphagia, unspecified (R13.10) Attention and concentration deficit following -- Frontal lobe and executive function deficit following -- Impact on safety and function  No limitations   CHL IP TREATMENT RECOMMENDATION 05/27/2021 Treatment Recommendations Therapy as outlined in treatment plan below   Prognosis 05/15/2021 Prognosis for Safe Diet Advancement Good Barriers to Reach Goals -- Barriers/Prognosis Comment -- CHL IP DIET RECOMMENDATION 05/27/2021 SLP Diet Recommendations Regular solids;Thin liquid Liquid Administration via Straw;Cup Medication Administration Whole meds with liquid Compensations Minimize environmental distractions Postural Changes --   CHL IP OTHER RECOMMENDATIONS 05/27/2021 Recommended Consults -- Oral Care Recommendations Oral care BID Other Recommendations --   CHL IP FOLLOW UP RECOMMENDATIONS 05/27/2021 Follow up Recommendations Inpatient Rehab   CHL IP FREQUENCY AND DURATION 05/27/2021 Speech Therapy Frequency (ACUTE ONLY) min 2x/week Treatment Duration 2 weeks      CHL IP ORAL PHASE 05/27/2021 Oral Phase WFL Oral - Pudding Teaspoon -- Oral - Pudding Cup -- Oral - Honey Teaspoon -- Oral - Honey Cup -- Oral - Nectar Teaspoon -- Oral - Nectar Cup -- Oral - Nectar Straw -- Oral - Thin Teaspoon -- Oral - Thin Cup -- Oral - Thin Straw -- Oral - Puree -- Oral - Mech Soft -- Oral - Regular -- Oral - Multi-Consistency -- Oral -  Pill -- Oral Phase - Comment --  CHL IP PHARYNGEAL PHASE 05/27/2021 Pharyngeal Phase WFL Pharyngeal- Pudding Teaspoon -- Pharyngeal -- Pharyngeal- Pudding Cup -- Pharyngeal -- Pharyngeal- Honey Teaspoon -- Pharyngeal -- Pharyngeal- Honey Cup -- Pharyngeal -- Pharyngeal- Nectar Teaspoon -- Pharyngeal -- Pharyngeal- Nectar Cup -- Pharyngeal -- Pharyngeal- Nectar Straw -- Pharyngeal -- Pharyngeal- Thin Teaspoon -- Pharyngeal -- Pharyngeal- Thin Cup -- Pharyngeal -- Pharyngeal- Thin Straw -- Pharyngeal -- Pharyngeal- Puree -- Pharyngeal -- Pharyngeal- Mechanical Soft -- Pharyngeal -- Pharyngeal- Regular -- Pharyngeal -- Pharyngeal- Multi-consistency -- Pharyngeal -- Pharyngeal- Pill -- Pharyngeal -- Pharyngeal Comment --  No flowsheet data found. Blenda Mounts Laurice 05/27/2021, 9:52 AM                        Medical Problem List and Plan: 1.  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  Status post right frontal ventriculostomy for hydrocephalus             -patient may  shower             -ELOS/Goals: 22 to 25 days 2.  Antithrombotics: -DVT/anticoagulation: Subcutaneous heparin initiated 05/18/2021             -antiplatelet therapy: N/A 3. Pain Management: Tylenol as needed 4. Mood: Provigil 100 mg daily             -antipsychotic agents: N/A 5. Neuropsych: This patient is capable of making decisions on her own behalf. 6. Skin/Wound Care: Routine skin checks 7. Fluids/Electrolytes/Nutrition: Routine in and outs with follow-up chemistries 8.  Seizure prophylaxis.  Keppra completed 9.  Hypertension.  Norvasc 10 mg daily, hydralazine 20 mg every 8 hours, lisinopril 40 mg daily, labetalol 200 mg 3 times daily.  Monitor with increased mobility 10.  Hyperglycemia.  Hemoglobin A1c 5.7.  SSI        Mcarthur Rossetti Angiulli, PA-C "I have personally performed a face to face diagnostic evaluation of this patient.  Additionally, I have  reviewed and concur with the physician assistant's documentation above." Erick Colace M.D. [facility] Fellow Am Acad of Phys Med and Rehab Diplomate Am Board of Electrodiagnostic Med Fellow Am Board of Interventional Pain

## 2021-05-30 NOTE — Progress Notes (Signed)
Patient ID: Marissa Morris, female   DOB: June 12, 1961, 60 y.o.   MRN: [id number]  Patient admitted to IP rehab this afternoon. No complaints of pain at this time, call light and personal belongings within reach. Vic Ripper

## 2021-05-31 ENCOUNTER — Inpatient Hospital Stay (HOSPITAL_COMMUNITY): Payer: BLUE CROSS/BLUE SHIELD

## 2021-05-31 IMAGING — DX DG KNEE 1-2V*R*
1 series · 2 of 2 positions shown · non-contrast
Comparison: None.

CLINICAL DATA: Knee pain no injury

EXAM:
RIGHT KNEE - 1-2 VIEW

[Series 1: knee · 0.14mm/px · 2 of 2 slices shown]
[im 1/2]
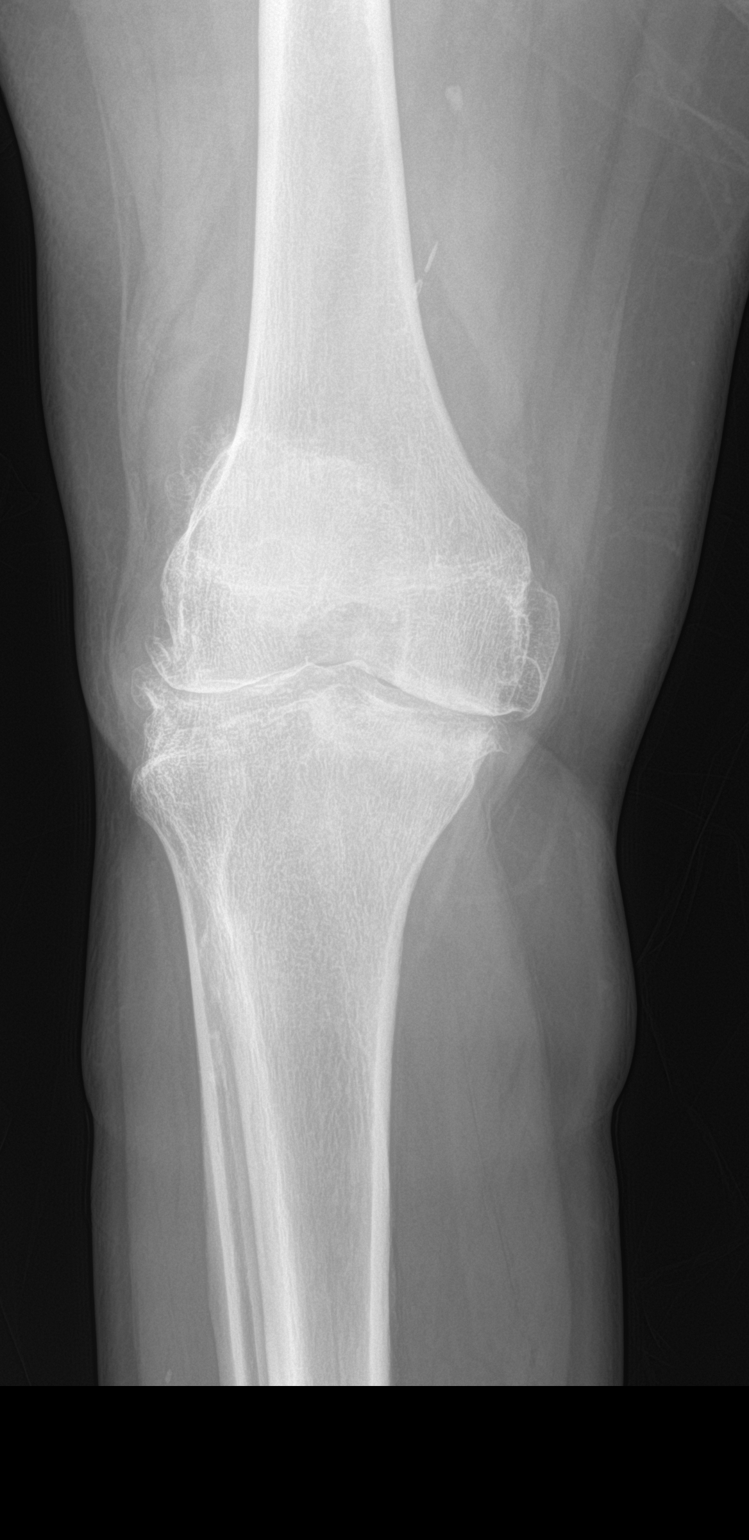
[im 2/2]
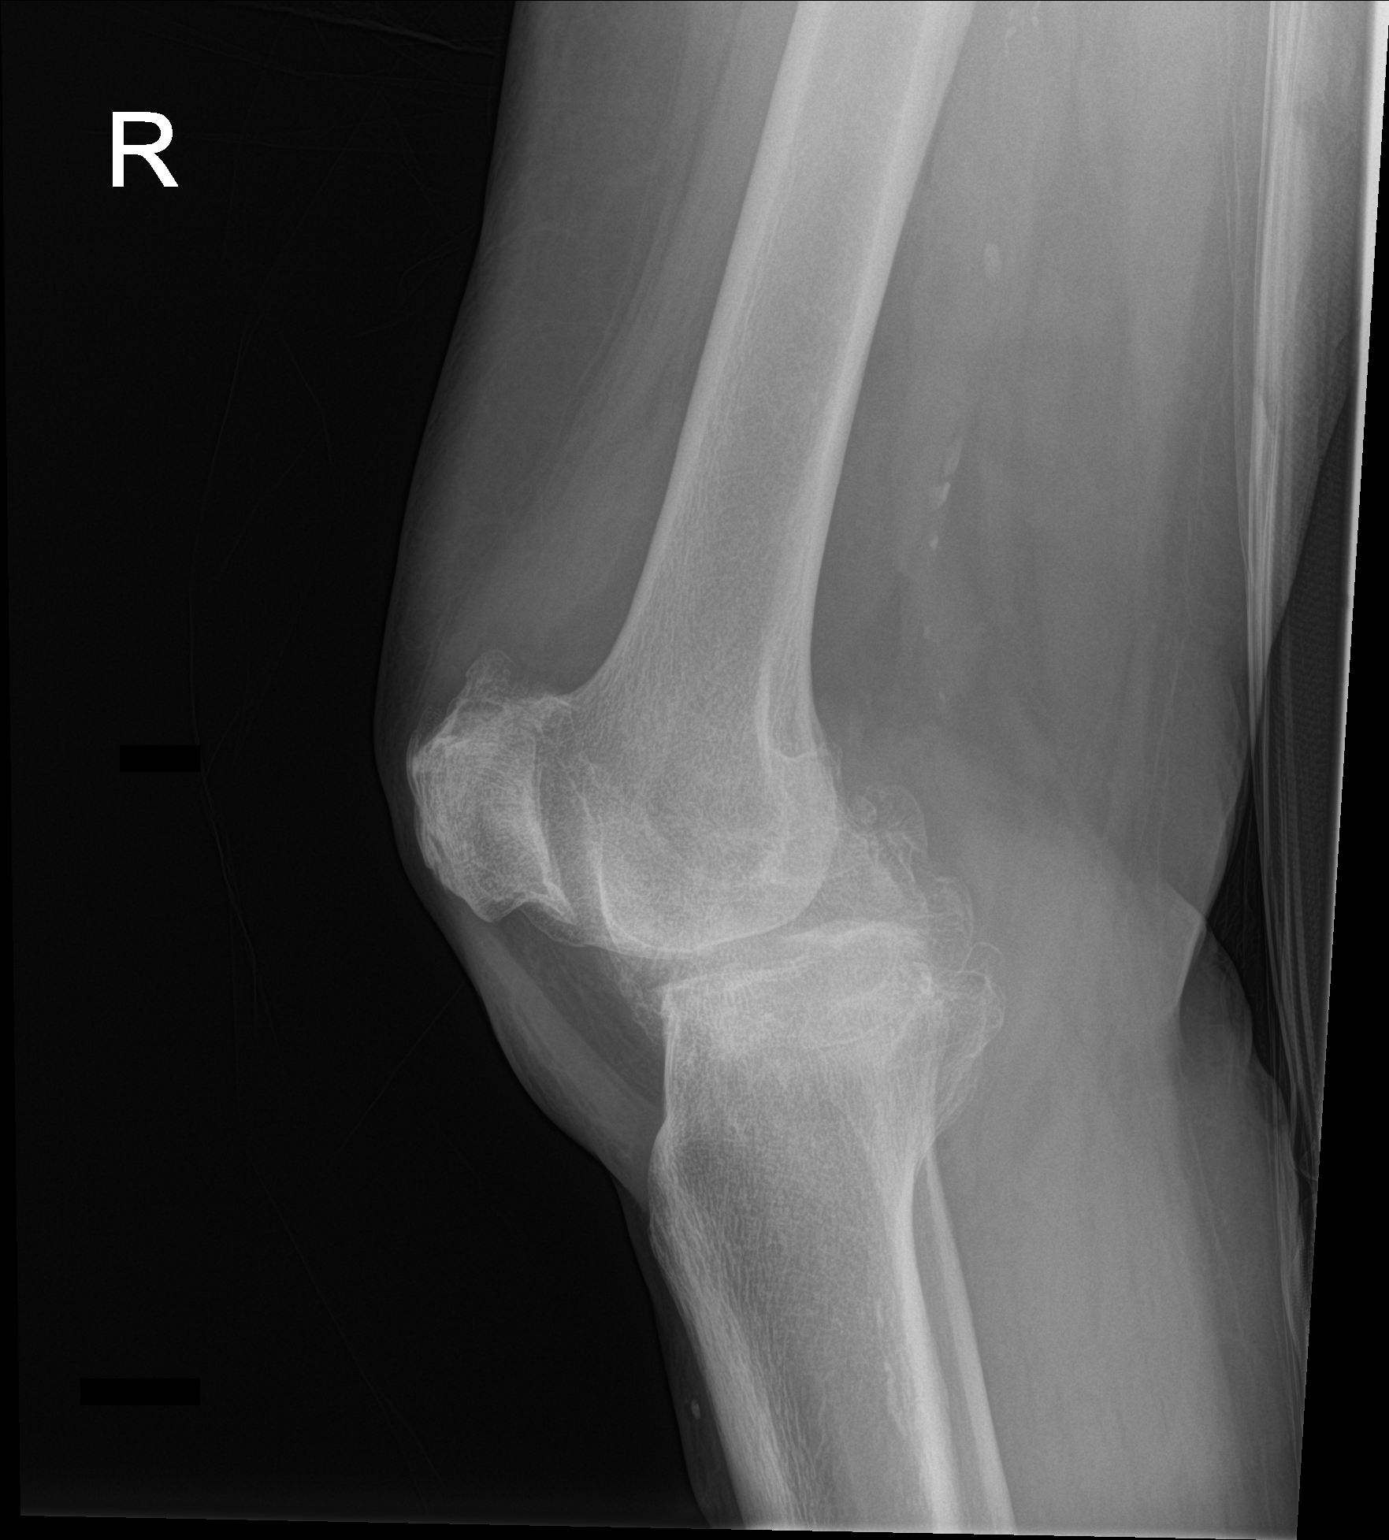

[2 of 2 positions shown; findings below may reference images not displayed]

FINDINGS: Advanced tricompartmental degenerative change with joint space
narrowing and extensive spurring in all 3 compartments. Moderate
joint effusion. Negative for fracture.
IMPRESSION: Advanced tricompartmental degenerative change.  Joint effusion.

## 2021-05-31 MED ORDER — DICLOFENAC SODIUM 1 % EX GEL
2.0000 g | Freq: Four times a day (QID) | CUTANEOUS | Status: DC
  Administered 2021-05-31 – 2021-06-25 (×98): 2 g via TOPICAL
  Filled 2021-05-31 (×7): qty 100

## 2021-05-31 NOTE — Discharge Instructions (Addendum)
Inpatient Rehab Discharge Instructions  Anatasia Abruzzo Discharge date and time: No discharge date for patient encounter.   Activities/Precautions/ Functional Status: Activity: As tolerated Diet: Regular Wound Care: Routine skin checks Functional status:  ___ No restrictions     ___ Walk up steps independently ___ 24/7 supervision/assistance   ___ Walk up steps with assistance ___ Intermittent supervision/assistance  ___ Bathe/dress independently ___ Walk with walker     __x_ Bathe/dress with assistance ___ Walk Independently    ___ Shower independently ___ Walk with assistance    ___ Shower with assistance ___ No alcohol     ___ Return to work/school ________  COMMUNITY REFERRALS UPON DISCHARGE:    Home Health:   PT     OT     ST                 [facility] Phone: [phone]    Medical Equipment/Items Ordered:Hospital Bed, Wheelchair, Bedside Commode                                                 Agency/Supplier: [facility] , [employer] (Bedside Commode)   Special Instructions:  No driving smoking or alcohol   My questions have been answered and I understand these instructions. I will adhere to these goals and the provided educational materials after my discharge from the hospital.  Patient/Caregiver Signature _______________________________ Date __________  Clinician Signature _______________________________________ Date __________  Please bring this form and your medication list with you to all your follow-up doctor's appointments.

## 2021-05-31 NOTE — Evaluation (Signed)
Physical Therapy Assessment and Plan  Patient Details  Name: Marissa Morris MRN: [id number] Date of Birth: 1961-02-19  PT Diagnosis: Abnormal posture, Difficulty walking, Hemiplegia dominant, Impaired cognition, Impaired sensation, and Muscle weakness Rehab Potential: Good ELOS: 3-4 Weeks   Today's Date: 05/31/2021 PT Individual Time: 7026-3785 PT Individual Time Calculation (min): 56 min    Hospital Problem: Principal Problem:   Thalamic hemorrhage ([place])   Past Medical History: History reviewed. No pertinent past medical history. Past Surgical History: History reviewed. No pertinent surgical history.  Assessment & Plan Clinical Impression: Patient is a 60 year old right-handed female with history of hypertension as well as back surgery.  Per chart review lives alone.  1 level home with ramped entrance.  Reportedly independent prior to admission.  She has a supportive fianc.  Presented 05/14/2021 on transfer from [facility] after presenting with right side weakness nausea vomiting facial droop with dysarthria.  Noted blood pressure 222/128.  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  Neurosurgery consulted Dr. Duffy Rhody and underwent right frontal ventriculostomy for hydrocephalus.  Patient was managed with hydralazine and nicardipine for blood pressure control.  Keppra was initially initiated for seizure prophylaxis and course completed.  Follow-up MRI/MRA showed no substantial change in thalamic hemorrhage with significant intraventricular extension mild hydrocephalus as well as mild edema and mass-effect compared to recent CT.  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  MRA was unremarkable and CT head completed 05/26/2021 showing interval removal of ventricular catheter with significant decrease in intraventricular hemorrhage.  No new hemorrhage or worsening of mass-effect..  She was  cleared to begin subcutaneous heparin for DVT prophylaxis 05/18/2021.  Echocardiogram with ejection fraction of 50 to 88% grade 2 diastolic dysfunction.  Admission chemistries unremarkable except glucose 117 WBC 12,800 urinalysis negative urine drug screen negative.  Patient maintained on Cleviprex initially for blood pressure control.  She was initially n.p.o. with alternative means of nutritional support and diet has been advanced to regular.  Bouts of initial agitation restlessness receiving Zyprexa via.  Patient with mild hyponatremia 128 132 since improved to 135 and monitored.  Patient transferred to CIR on 05/30/2021 .   Patient currently requires max with mobility secondary to muscle weakness, decreased cardiorespiratoy endurance, abnormal tone, motor apraxia, ataxia, decreased coordination, and decreased motor planning, decreased attention to right, decreased attention, decreased awareness, decreased problem solving, and decreased safety awareness, and decreased sitting balance, decreased standing balance, decreased postural control, hemiplegia, and decreased balance strategies.  Prior to hospitalization, patient was independent  with mobility and lived with   in a House home.  Home access is  Ramped entrance.  Patient will benefit from skilled PT intervention to maximize safe functional mobility, minimize fall risk, and decrease caregiver burden for planned discharge home with 24 hour assist.  Anticipate patient will benefit from follow up [facility] at discharge.  PT - End of Session Activity Tolerance: Tolerates 10 - 20 min activity with multiple rests Endurance Deficit: Yes PT Assessment Rehab Potential (ACUTE/IP ONLY): Good PT Barriers to Discharge: Decreased caregiver support PT Patient demonstrates impairments in the following area(s): Balance;Behavior;Endurance;Motor;Pain;Perception;Safety;Sensory PT Transfers Functional Problem(s): Bed Mobility;Bed to Chair;Car;Furniture PT Locomotion Functional  Problem(s): Ambulation;Stairs;Wheelchair Mobility PT Plan PT Intensity: Minimum of 1-2 x/day ,45 to 90 minutes PT Frequency: 5 out of 7 days PT Duration Estimated Length of Stay: 3-4 Weeks PT Treatment/Interventions: Ambulation/gait training;Community reintegration;DME/adaptive equipment instruction;Neuromuscular re-education;Psychosocial support;Wheelchair propulsion/positioning;Stair training;UE/LE Strength taining/ROM;Balance/vestibular training;Discharge  planning;Functional electrical stimulation;Pain management;Skin care/wound management;Therapeutic Activities;UE/LE Coordination activities;Cognitive remediation/compensation;Disease management/prevention;Functional mobility training;Patient/family education;Splinting/orthotics;Therapeutic Exercise;Visual/perceptual remediation/compensation PT Transfers Anticipated Outcome(s): MinA PT Locomotion Anticipated Outcome(s): MinA household distances PT Recommendation Follow Up Recommendations: Home health PT;24 hour supervision/assistance Patient destination: Home Equipment Recommended: To be determined   PT Evaluation Precautions/Restrictions Precautions Precautions: Fall Precaution Comments: Rt hemiparesis with tone in UE/LE Restrictions Weight Bearing Restrictions: No General Chart Reviewed: Yes Family/Caregiver Present: No Vital Signs  Pain Pain Assessment Pain Scale: 0-10 Pain Score: 2  Pain Type: Acute pain Pain Location: Knee Pain Orientation: Left Pain Descriptors / Indicators: Aching Pain Frequency: Occasional Pain Onset: On-going Patients Stated Pain Goal: 0 Pain Intervention(s): Refused Home Living/Prior Functioning Home Living Available Help at Discharge: Family;Friend(s) Type of Home: House Home Access: Ramped entrance Home Layout: One level Bathroom Shower/Tub: Chiropodist: Standard Bathroom Accessibility: Yes Additional Comments: All home living/discharge setting information provided by  chart, as pt states "I don't know" when asked these questions Prior Function Level of Independence: Independent with basic ADLs;Independent with gait (Per EMR)  Able to Take Stairs?:  (unknown) Driving:  (unknown) 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 Vision/Perception  Vision - Assessment Alignment/Gaze Preference: Gaze left;Head turned Perception Perception: Impaired Inattention/Neglect: Does not attend to right visual field;Does not attend to right side of body Praxis Praxis: Impaired Praxis Impairment Details: Motor planning;Initiation;Perseveration  Cognition Overall Cognitive Status: Impaired/Different from baseline Arousal/Alertness: Awake/alert Orientation Level: Oriented to person;Oriented to place Attention: Sustained Sustained Attention: Impaired Memory: Impaired Immediate Memory Recall: Sock;Blue;Bed Memory Recall Sock: Not able to recall Memory Recall Blue: Not able to recall Memory Recall Bed: Not able to recall Awareness: Impaired Awareness Impairment: Emergent impairment Problem Solving: Impaired Behaviors: Perseveration Safety/Judgment: Impaired Comments: Decreased insight into deficits during self care activity Sensation Sensation Light Touch: Impaired Detail Light Touch Impaired Details: Impaired RUE;Impaired RLE (Pt provides inconsistent responses with regard to light tough) Coordination Gross Motor Movements are Fluid and Coordinated: No Fine Motor Movements are Fluid and Coordinated: No Coordination and Movement Description: Affected by Rt hemiparesis with abnormal tone as well as balance deficits Finger Nose Finger Test: Dysmetric on the Lt with pt having trouble performing test accurately due to cognition, unable to complete on the Rt side due to proximal weakness Motor  Motor Motor: Hemiplegia;Abnormal tone;Abnormal postural alignment and control   Trunk/Postural Assessment  Cervical Assessment Cervical  Assessment:  (forward head) Thoracic Assessment Thoracic Assessment:  (rounded shoulders) Lumbar Assessment Lumbar Assessment:  (posterior pelvic tilt) Postural Control Postural Control: Deficits on evaluation (Loses balance to the r in sitting multiple times)  Balance Balance Balance Assessed: Yes Static Sitting Balance Static Sitting - Balance Support: Feet supported Static Sitting - Level of Assistance: 3: Mod assist Dynamic Sitting Balance Dynamic Sitting - Balance Support: During functional activity Dynamic Sitting - Level of Assistance: 2: Max assist Static Standing Balance Static Standing - Balance Support: During functional activity Static Standing - Level of Assistance: 2: Max assist Dynamic Standing Balance Dynamic Standing - Balance Support: During functional activity Dynamic Standing - Level of Assistance: 2: Max assist Extremity Assessment  RUE Assessment RUE Assessment: Exceptions to WFL (Nonfunctional with flexor tone, proximal>distal weakness) LUE Assessment LUE Assessment: Within Functional Limits RLE Assessment General Strength Comments: Grossly 2/5 LLE Assessment General Strength Comments: Grossly 4+/5  Care Tool Care Tool Bed Mobility Roll left and right activity   Roll left and right assist level: Moderate Assistance - Patient 50 - 74%  Sit to lying activity   Sit to lying assist level: Moderate Assistance - Patient 50 - 74%    Lying to sitting edge of bed activity   Lying to sitting edge of bed assist level: Maximal Assistance - Patient 25 - 49%     Care Tool Transfers Sit to stand transfer   Sit to stand assist level: Maximal Assistance - Patient 25 - 49%    Chair/bed transfer   Chair/bed transfer assist level: Maximal Assistance - Patient 25 - 49%     Toilet transfer Toilet transfer activity did not occur: Safety/medical concerns      Geneticist, molecular transfer assist level: Maximal Assistance - Patient 25 - 49%      Care Tool  Locomotion Ambulation   Assist level: 2 helpers Assistive device: Hand held assist (L handrail with +2 WC follow) Max distance: 5'  Walk 10 feet activity Walk 10 feet activity did not occur: Safety/medical concerns       Walk 50 feet with 2 turns activity Walk 50 feet with 2 turns activity did not occur: Safety/medical concerns      Walk 150 feet activity Walk 150 feet activity did not occur: Safety/medical concerns      Walk 10 feet on uneven surfaces activity Walk 10 feet on uneven surfaces activity did not occur: Safety/medical concerns      Stairs Stair activity did not occur: Safety/medical concerns        Walk up/down 1 step activity Walk up/down 1 step or curb (drop down) activity did not occur: Safety/medical concerns     Walk up/down 4 steps activity did not occuR: Safety/medical concerns  Walk up/down 4 steps activity      Walk up/down 12 steps activity Walk up/down 12 steps activity did not occur: Safety/medical concerns      Pick up small objects from floor Pick up small object from the floor (from standing position) activity did not occur: Safety/medical concerns      Wheelchair Will patient use wheelchair at discharge?: No          Wheel 50 feet with 2 turns activity      Wheel 150 feet activity        Refer to Care Plan for Long Term Goals  SHORT TERM GOAL WEEK 1 PT Short Term Goal 1 (Week 1): Pt will perform bed mobility with modA. PT Short Term Goal 2 (Week 1): Pt will perform bed to chair transfer with modA. PT Short Term Goal 3 (Week 1): Pt will perform sit to stand with modA. PT Short Term Goal 4 (Week 1): Pt will ambulate x25' with maxA and LRAD.  Recommendations for other services: None   Skilled Therapeutic Intervention  Evaluation completed (see details above and below) with education on PT POC and goals and individual treatment initiated with focus on bed mobility, balance, transfers, and ambulation. Pt received supine in bed and  agrees to therapy. No complaint of pain. Pt initially conversational with PT, though aphasic, but as session goes on pt begins talking less and less, transitioning to grunts and gestures and occasional 1-word responses. Supine to sit with maxA. Pt has R lean in sitting and requires modA for safety. Pt performs sit to stand and stand pivot transfer to [facility] with HHA and PT blocking R knee. WC transport to gym for time management. Pt performs car transfer with maxA and PT demo of transfer prior to pt attempt. Pt attends dance group for several  minutes to engage pt in group activity with other patient's and attempt to stimulate increased movement as pt fairly lethargic through much of evaluation. Pt ambulates x5' with L handrail in hallway and PT under R arm and providing maxA/totalA for management of R lower extremity and blocking of R knee during stance phase. Pt buckles through R leg and flexes trunk forward during each cycle of stance phase. PT provides maxA and has +2 close WC follow. WC transport back to room. Stand pivot to bed with maxA and sit to supine with modA. Left supine with NT present and all needs within reach.   Mobility Bed Mobility Bed Mobility: Supine to Sit;Sit to Supine Supine to Sit: Maximal Assistance - Patient - Patient 25-49% Sit to Supine: Moderate Assistance - Patient 50-74% Transfers Transfers: Sit to Stand;Stand to Sit;Stand Pivot Transfers Sit to Stand: Maximal Assistance - Patient 25-49% Stand to Sit: Maximal Assistance - Patient 25-49% Stand Pivot Transfers: Maximal Assistance - Patient 25 - 49% Stand Pivot Transfer Details: Verbal cues for sequencing;Tactile cues for posture;Manual facilitation for weight shifting;Verbal cues for technique;Tactile cues for initiation;Manual facilitation for placement Transfer (Assistive device): 1 person hand held assist Locomotion  Gait Ambulation: Yes Gait Assistance: 2 Helpers Gait Distance (Feet): 5 Feet Assistive device: 1 person  hand held assist (L handrail) Gait Assistance Details: Manual facilitation for weight shifting;Verbal cues for gait pattern;Verbal cues for technique;Manual facilitation for placement;Tactile cues for posture;Tactile cues for sequencing Gait Gait: Yes Gait Pattern: Impaired (R hemi gait, pt buckles thorugh RLE during stance phase) Gait velocity: Decreased Stairs / Additional Locomotion Stairs: No Wheelchair Mobility Wheelchair Mobility: No   Discharge Criteria: Patient will be discharged from PT if patient refuses treatment 3 consecutive times without medical reason, if treatment goals not met, if there is a change in medical status, if patient makes no progress towards goals or if patient is discharged from hospital.  The above assessment, treatment plan, treatment alternatives and goals were discussed and mutually agreed upon: by patient  Breck Coons, PT, DPt 05/31/2021, 12:51 PM

## 2021-05-31 NOTE — Progress Notes (Signed)
PROGRESS NOTE   Subjective/Complaints:  Right side of face feels funny compared to left, has difficulty with description, remains aphasic  Review of systems unable to obtain secondary to aphasia  Objective:   No results found. No results for input(s): WBC, HGB, HCT, PLT in the last 72 hours. No results for input(s): NA, K, CL, CO2, GLUCOSE, BUN, CREATININE, CALCIUM in the last 72 hours.  Intake/Output Summary (Last 24 hours) at 05/31/2021 0854 Last data filed at 05/30/2021 2002 Gross per 24 hour  Intake 100 ml  Output --  Net 100 ml        Physical Exam: Vital Signs Blood pressure 135/68, pulse 74, temperature 98.1 F (36.7 C), resp. rate 18, height 5\' 3"  (1.6 m), weight 72.5 kg, SpO2 98 %.   General: No acute distress Mood and affect are appropriate Heart: Regular rate and rhythm no rubs murmurs or extra sounds Lungs: Clear to auscultation, breathing unlabored, no rales or wheezes Abdomen: Positive bowel sounds, soft nontender to palpation, nondistended Extremities: No clubbing, cyanosis, or edema Skin: No evidence of breakdown, no evidence of rash Neurologic: Cranial nerves II through XII intact, motor strength is 5/5 in left and 0 right deltoid, bicep, tricep, grip, hip flexor, knee extensors, ankle dorsiflexor and plantar flexor Sensory exam normal sensation to light touch and proprioception in bilateral upper and lower extremities Cerebellar exam normal finger to nose to finger as well as heel to shin in bilateral upper and lower extremities Musculoskeletal: No pain with ankle range of motion on the right there is pain with knee range of motion.  Knee without erythema, minimal effusion   Assessment/Plan: 1. Functional deficits which require 3+ hours per day of interdisciplinary therapy in a comprehensive inpatient rehab setting. Physiatrist is providing close team supervision and 24 hour management of active  medical problems listed below. Physiatrist and rehab team continue to assess barriers to discharge/monitor patient progress toward functional and medical goals  Care Tool:  Bathing              Bathing assist       Upper Body Dressing/Undressing Upper body dressing   What is the patient wearing?: Hospital gown only    Upper body assist Assist Level: Moderate Assistance - Patient 50 - 74%    Lower Body Dressing/Undressing Lower body dressing      What is the patient wearing?: Incontinence brief     Lower body assist Assist for lower body dressing: Moderate Assistance - Patient 50 - 74%     Toileting Toileting    Toileting assist Assist for toileting: Moderate Assistance - Patient 50 - 74%     Transfers Chair/bed transfer  Transfers assist           Locomotion Ambulation   Ambulation assist              Walk 10 feet activity   Assist           Walk 50 feet activity   Assist           Walk 150 feet activity   Assist           Walk 10 feet on  uneven surface  activity   Assist           Wheelchair     Assist               Wheelchair 50 feet with 2 turns activity    Assist            Wheelchair 150 feet activity     Assist          Blood pressure 135/68, pulse 74, temperature 98.1 F (36.7 C), resp. rate 18, height 5\' 3"  (1.6 m), weight 72.5 kg, SpO2 98 %.   Medical Problem List and Plan: 1.  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  Status post right frontal ventriculostomy for hydrocephalus             -patient may  shower             -ELOS/Goals: 22 to 25 days 2.  Antithrombotics: -DVT/anticoagulation: Subcutaneous heparin initiated 05/18/2021             -antiplatelet therapy: N/A 3. Pain Management: Tylenol as needed Abnormal sensation right side of face, has paresthesias, consider gabapentin if these  become more painful Right knee pain, suspect osteoarthritis, will check x-ray, start Voltaren gel 4. Mood: Provigil 100 mg daily             -antipsychotic agents: N/A 5. Neuropsych: This patient is capable of making decisions on her own behalf. 6. Skin/Wound Care: Routine skin checks 7. Fluids/Electrolytes/Nutrition: Routine in and outs with follow-up chemistries 8.  Seizure prophylaxis.  Keppra completed 9.  Hypertension.  Norvasc 10 mg daily, hydralazine 20 mg every 8 hours, lisinopril 40 mg daily, labetalol 200 mg 3 times daily.  Monitor with increased mobility Vitals:   05/30/21 1949 05/31/21 0350  BP: 126/76 135/68  Pulse: 70 74  Resp: 19 18  Temp: (!) 97.5 F (36.4 C) 98.1 F (36.7 C)  SpO2: 99% 98%  Controlled, 05/31/2021 10.  Hyperglycemia.  Hemoglobin A1c 5.7.  SSI    LOS: 1 days A FACE TO FACE EVALUATION WAS PERFORMED  06/02/2021 05/31/2021, 8:54 AM

## 2021-05-31 NOTE — Progress Notes (Signed)
Retta Diones, RN  Rehab Admission Coordinator  Physical Medicine and Rehabilitation  PMR Pre-admission      Attested  Date of Service:  05/18/2021  1:56 PM       Related encounter: ED to Hosp-Admission (Discharged) from 05/13/2021 in [place]       Attested         Attestation signed by Charlett Blake, MD at 05/30/2021 10:45 AM   Left thalamic hemorrhage with aphasia  BP now controlled of Cleviprex                                                                                                                                                                                                                                                                                                                                                                                                                     PMR Admission Coordinator Pre-Admission Assessment   Patient: Marissa Morris is an 60 y.o., female MRN: [id number] DOB: [birth year] Height:   Weight: 75.2 kg  Insurance Information HMO: Yes    PPO:       PCP:      IPA:      80/20:      OTHER: PRIMARY: BCBS     Policy#: [id number]      Subscriber: pt CM Name: Merlene Pulling      Phone#: [phone]   Fax#: [phone] Pre-Cert#: [id number] for 7 days with update due on 06/04/21     Employer: [employer] Benefits:  Phone #: [phone]     Name: Kristie Cowman. Date: 12/13/20     Deduct: $2400 (met $260)      Out of Pocket Max: [phone] (met $260)      Life Max: N/A  CIR: 65% coverage, 35% copay      SNF: 65% coverage with 100 days per year Outpatient: 65% with 30 days/yr     Co-Pay: 35% Home Health: 65%    with 100 visits per year   Co-Pay: 35% DME: 65%      Co-Pay: 35% Providers: in network  SECONDARY:  none    Financial Counselor:       Phone#:    [employer], [employer]" for patients in Inpatient Rehabilitation Facilities with attached "Privacy Act [place] Records" was provided and verbally reviewed with: N/A   Emergency Contact Information Contact Information       Name Relation Home Work [place], [place] Sister     [phone]    Mal Misty "Clare Gandy" Significant other [phone]   [phone]         Current Medical History  Patient Admitting Diagnosis: Left thalamic ICH with IVH   History of Present Illness:  A 60 year old right-handed female with history of hypertension as well as back surgery.  Per chart review lives alone.  1 level home with ramped entrance.  Reportedly independent prior to admission.  She has a supportive fianc.  Presented 05/14/2021 on transfer from [facility] after presenting with right side weakness nausea vomiting facial droop with dysarthria.  Noted blood pressure 222/128.  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  Neurosurgery consulted Dr. Duffy Rhody and underwent right frontal ventriculostomy for hydrocephalus.  Patient was managed with hydralazine and nicardipine for blood pressure control.  Keppra was initially initiated for seizure prophylaxis and course completed.  Follow-up MRI/MRA showed no substantial change in thalamic hemorrhage with significant intraventricular extension mild hydrocephalus as well as mild edema and mass-effect compared to recent CT.  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  MRA was unremarkable and CT head completed 05/26/2021 showing interval removal of ventricular catheter with significant decrease in intraventricular hemorrhage.  No new hemorrhage or worsening of mass-effect..  She was cleared to begin subcutaneous heparin for DVT  prophylaxis 05/18/2021.  Echocardiogram with ejection fraction of 50 to 44% grade 2 diastolic dysfunction.  Admission chemistries unremarkable except glucose 117 WBC 12,800 urinalysis negative urine drug screen negative.  Patient maintained on Cleviprex initially for blood pressure control.  She was initially n.p.o. with alternative means of nutritional support and diet has been advanced to regular.  Bouts of initial agitation restlessness receiving Zyprexa via.  Patient with mild hyponatremia 128 132 since improved to 135 and monitored.  Therapy evaluations completed due to patient's right side weakness and dysarthria. Recommendations are for inpatient rehab admission.   Complete NIHSS TOTAL: 13 Glasgow Coma Scale Score: 14   Past Medical History  History reviewed. No pertinent past medical history.   Family History  family history is not on file.   Prior Rehab/Hospitalizations:  Has the patient had prior rehab or hospitalizations prior to admission? yes   Has the patient had major surgery during 100 days prior to admission? yes   Current Medications    Current Facility-Administered Medications:   amLODipine (NORVASC) tablet 10 mg, 10 mg, Oral, Daily, Skeet Simmer, RPH, 10 mg at 05/29/21 0935   chlorhexidine (PERIDEX) 0.12 % solution 15 mL, 15 mL, Mouth Rinse, BID, Garvin Fila, MD, 15 mL at 05/29/21 0933   Chlorhexidine Gluconate Cloth 2 % PADS 6 each, 6 each, Topical, Daily, Kerney Elbe, MD, 6 each at 05/24/21 1327   heparin injection 5,000 Units, 5,000 Units, Subcutaneous, Q8H, Metzger-Cihelka, Desiree, NP, 5,000 Units at 05/29/21 0708   hydrALAZINE (APRESOLINE) tablet 20 mg, 20 mg, Oral, Q8H, Skeet Simmer, RPH, 20 mg at 05/29/21 0708   insulin aspart (novoLOG) injection 0-6 Units, 0-6 Units, Subcutaneous, Q4H, Harris, Whitney D, NP, 1 Units at 05/26/21 2010   labetalol (NORMODYNE) tablet 200 mg, 200 mg, Oral, TID, Skeet Simmer, RPH, 200 mg at 05/29/21 0935   lisinopril  (ZESTRIL) tablet 40 mg, 40 mg, Oral, Daily, Skeet Simmer, RPH, 40 mg at 05/29/21 0935   magic mouthwash, 1 mL, Oral, TID, Corey Harold, NP, 1 mL at 05/29/21 0933   MEDLINE mouth rinse, 15 mL, Mouth Rinse, q12n4p, Leonie Man, Pramod S, MD, 15 mL at 05/28/21 1721   modafinil (PROVIGIL) tablet 100 mg, 100 mg, Oral, Daily, Skeet Simmer, RPH, 100 mg at 05/29/21 0935   senna-docusate (Senokot-S) tablet 1 tablet, 1 tablet, Oral, BID, Skeet Simmer, [facility], 1 tablet at 05/29/21 0935   Patients Current Diet:  Diet Order                  Diet - low sodium heart healthy             Diet regular Room service appropriate? Yes with Assist; Fluid consistency: Thin  Diet effective now                       Tube feed via Cortrak   Precautions / Restrictions Precautions Precautions: Fall Precaution Comments: cortrack Restrictions Weight Bearing Restrictions: No    Has the patient had 2 or more falls or a fall with injury in the past year?No   Prior Activity Level Community (5-7x/wk): drives, out of house daily. Was caring for her Mom until her death 1 1/2 years ago. 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   Self Care: Did the patient need help bathing, dressing, using the toilet or eating?  Independent   Indoor Mobility: Did the patient need assistance with walking from room to room (with or without device)? Independent   Stairs: Did the patient need assistance with internal or external stairs (with or without device)? Independent   Functional Cognition: Did the patient need help planning regular tasks such as shopping or remembering to take medications? Independent   Home Assistive Devices / Equipment Home Assistive Devices/Equipment: None Home Equipment: Bedside commode, Wheelchair - manual (bariatric w/c)    Prior Device Use: Indicate devices/aids used by the patient prior to current illness, exacerbation or injury? None of the above  Current Functional Level Cognition   Arousal/Alertness: Lethargic Overall Cognitive Status: Impaired/Different from baseline Difficult to assess due to: Level of arousal Current Attention Level: Sustained Orientation Level: Oriented to person, Oriented to place Following Commands: Follows one step commands inconsistently, Follows one step commands with increased time Safety/Judgement: Decreased awareness of safety, Decreased awareness of deficits General Comments: pt following 1 step commands 50% of session with delays to respond; requiring increased time for processing. No verbalization this session.Poor awareness and attention to R side of body or environment. Pt leaning her head against her hands when fatigued. Attention: Sustained Sustained Attention: Impaired Sustained Attention Impairment: Functional basic, Verbal basic Memory:  (TBA) Awareness: Impaired Awareness Impairment: Emergent impairment Problem Solving:  (will assess further) Behaviors: Perseveration Safety/Judgment: Impaired    Extremity Assessment (includes Sensation/Coordination)   Upper Extremity Assessment: RUE deficits/detail RUE Deficits / Details: tone present in sitting, spontaneous activation noted. in supine pt with very flaccid appearance to R UE without the tone. pt moving digits when activating L UE RUE Sensation: decreased light touch, decreased proprioception RUE Coordination: decreased fine motor, decreased gross motor LUE Deficits / Details: fair grip, following commands on L side  Lower Extremity Assessment: Defer to PT evaluation RLE Deficits / Details: No active movement noted, adductor tone. Reactive to painful stimulus     ADLs   Overall ADL's : Needs assistance/impaired Eating/Feeding: Minimal assistance, Bed level Eating/Feeding Details (indicate cue type and  reason): needs food in L visual field Grooming: Wash/dry face, Sitting Grooming Details (indicate cue type and reason): While sitting in stedy at sink, pt washing her face with step by step simple cues (in quiet environment) and Mod A for sitting balance and support at RUE. Upper Body Bathing: Maximal assistance, Bed level, Sitting Lower Body Bathing: Total assistance Upper Body Dressing : Maximal assistance, Bed level Upper Body Dressing Details (indicate cue type and reason): Max A to don new gown; Max cues for engaging RUE into task. Poor attention to R Lower Body Dressing: Maximal assistance, Sitting/lateral leans Lower Body Dressing Details (indicate cue type and reason): pt needs max cues to visual attend to feet. pt does not recognize a sock as a sock when asked. Pt with better (A) don sock with R LE being held in figure 4 due to L visual field Toilet Transfer: Maximal assistance, +2 for physical assistance (sit<>stand with stedy) Toilet Transfer Details (indicate cue type and reason): Max A +2 for sit<>stand with stedy Toileting- Clothing Manipulation and Hygiene: Total assistance Functional mobility during ADLs: Maximal assistance, +2 for physical assistance (sit<>stand with stedy) General ADL Comments: Upon arrival, pt soiled with urine in bed. Requring Max A for bathing and donning new gown. Use of stedy to perform sit<>stand and transition to sink for washing her face     Mobility   Overal bed mobility: Needs Assistance Bed Mobility: Rolling, Sidelying to Sit, Sit to Supine Rolling: Mod assist Sidelying to sit: Max assist Supine to sit: +2 for physical assistance, Max assist Sit to supine: Max assist, +2 for physical assistance General bed mobility comments: Max A to bring BLEs over EOB and elevate trunk. Pt pushing back into sidelying to R and requiring cues for maintaining midline. Max A +2 for returning to bed and supine.     Transfers   Overall transfer level: Needs  assistance Equipment used: 2 person hand held assist Transfer via Lift Equipment: Stedy Transfers: Sit to/from Stand Sit to Stand: Max assist, +2 physical assistance, +2 safety/equipment, From elevated  surface Stand pivot transfers: Max assist, +2 physical assistance, +2 safety/equipment Squat pivot transfers: Max assist, +2 physical assistance General transfer comment: Max A +2 to power up into standing     Ambulation / Gait / Stairs / Wheelchair Mobility   Ambulation/Gait General Gait Details: unable     Posture / Balance Dynamic Sitting Balance Sitting balance - Comments: Pushing to R. Able to attain midline for short periods with Max cues and lateral leaning to L. Balance Overall balance assessment: Needs assistance Sitting-balance support: Single extremity supported, Feet supported Sitting balance-Leahy Scale: Poor Sitting balance - Comments: Pushing to R. Able to attain midline for short periods with Max cues and lateral leaning to L. Postural control: Right lateral lean Standing balance support: Single extremity supported, During functional activity Standing balance-Leahy Scale: Poor Standing balance comment: Physical A for maintaining sitting balance     Special needs/care consideration 43 inches 10 Fr right nare Cortrak placed 6/3    Previous Home Environment  Living Arrangements: Alone Available Help at Discharge: Family, Friend(s) Type of Home: House Home Layout: One level Home Access: Ramped entrance Bathroom Shower/Tub: Chiropodist: Standard Bathroom Accessibility: Yes How Accessible: Accessible via walker [place]: No   Discharge Living Setting Plans for Discharge Living Setting: Lives with (comment) (to stay with her sister at discharge, [name]) Type of Home at Discharge: House Discharge Home Layout: One level Discharge Home Access: Stairs to enter Entrance Stairs-Rails: None Entrance Stairs-Number of Steps: 3 Discharge  Bathroom Shower/Tub: Tub/shower unit Discharge Bathroom Toilet: Standard Discharge Bathroom Accessibility: Yes How Accessible: Accessible via walker Does the patient have any problems obtaining your medications?: No   Social/Family/Support Systems Contact Information: [name] is main contact Anticipated Caregiver: [name], when she works her daughter in Sports coach, [name] Anticipated Caregiver's Contact Information: [phone] Ability/Limitations of Caregiver: she works, but DIL will be caregiver 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 with PT, OT and SLP Expected length of stay: ELOS 22 to 25 days Pt/Family Agrees to Admission and willing to participate: Yes Program Orientation Provided & Reviewed with Pt/Caregiver Including Roles  & Responsibilities: Yes   Decrease burden of Care through IP rehab admission: NA   Possible need for SNF placement upon discharge:Not anticipated   Patient Condition: This patient's medical and functional status has changed since the consult dated: 05/18/2021 in which the Rehabilitation Physician determined and documented that the patient's condition is appropriate for intensive rehabilitative care in an inpatient rehabilitation facility. See "History of Present Illness" (above) for medical update. Functional changes are: Currently max assist +2 for transfers and needs min to max A +2 for ADLs. Patient's medical and functional status update has been discussed with the Rehabilitation physician and patient remains appropriate for inpatient rehabilitation. Will admit to inpatient rehab tomorrow.   Preadmission Screen Completed By: Danne Baxter RN MSN with updates by Retta Diones, RN, 05/29/2021 11:27  AM ______________________________________________________________________   Discussed status with Dr. Naaman Plummer on 05/29/21 at 0930 and received approval for admission tomorrow, 05/30/21.   Admission Coordinator: Danne Baxter RN MSN with updates by Retta Diones, time 1126/Date 05/29/21            Cosigned by: Charlett Blake, MD at 05/30/2021 10:45 AM    Revision History  Note Details  Author Retta Diones, RN File Time 05/30/2021 10:45 AM  Author Type Rehab Admission Coordinator Status Signed  Last Editor Retta Diones, RN Service Physical Medicine and [place] # [id number] Admit Date 05/30/2021

## 2021-05-31 NOTE — Plan of Care (Signed)
  Problem: RH Balance Goal: LTG: Patient will maintain dynamic sitting balance (OT) Description: LTG:  Patient will maintain dynamic sitting balance with assistance during activities of daily living (OT) Flowsheets (Taken 05/31/2021 1248) LTG: Pt will maintain dynamic sitting balance during ADLs with: Minimal Assistance - Patient > 75%   Problem: RH Grooming Goal: LTG Patient will perform grooming w/assist,cues/equip (OT) Description: LTG: Patient will perform grooming with assist, with/without cues using equipment (OT) Flowsheets (Taken 05/31/2021 1248) LTG: Pt will perform grooming with assistance level of: Supervision/Verbal cueing   Problem: RH Bathing Goal: LTG Patient will bathe all body parts with assist levels (OT) Description: LTG: Patient will bathe all body parts with assist levels (OT) Flowsheets (Taken 05/31/2021 1248) LTG: Pt will perform bathing with assistance level/cueing: Minimal Assistance - Patient > 75%   Problem: RH Dressing Goal: LTG Patient will perform upper body dressing (OT) Description: LTG Patient will perform upper body dressing with assist, with/without cues (OT). Flowsheets (Taken 05/31/2021 1248) LTG: Pt will perform upper body dressing with assistance level of: Minimal Assistance - Patient > 75% Goal: LTG Patient will perform lower body dressing w/assist (OT) Description: LTG: Patient will perform lower body dressing with assist, with/without cues in positioning using equipment (OT) Flowsheets (Taken 05/31/2021 1248) LTG: Pt will perform lower body dressing with assistance level of: Minimal Assistance - Patient > 75%   Problem: RH Toileting Goal: LTG Patient will perform toileting task (3/3 steps) with assistance level (OT) Description: LTG: Patient will perform toileting task (3/3 steps) with assistance level (OT)  Flowsheets (Taken 05/31/2021 1248) LTG: Pt will perform toileting task (3/3 steps) with assistance level: Minimal Assistance - Patient > 75%    Problem: RH Toilet Transfers Goal: LTG Patient will perform toilet transfers w/assist (OT) Description: LTG: Patient will perform toilet transfers with assist, with/without cues using equipment (OT) Flowsheets (Taken 05/31/2021 1248) LTG: Pt will perform toilet transfers with assistance level of: Minimal Assistance - Patient > 75%   Problem: RH Tub/Shower Transfers Goal: LTG Patient will perform tub/shower transfers w/assist (OT) Description: LTG: Patient will perform tub/shower transfers with assist, with/without cues using equipment (OT) Flowsheets (Taken 05/31/2021 1248) LTG: Pt will perform tub/shower stall transfers with assistance level of: Minimal Assistance - Patient > 75%

## 2021-05-31 NOTE — Evaluation (Signed)
Occupational Therapy Assessment and Plan  Patient Details  Name: Marissa Morris MRN: [id number] Date of Birth: [birth year]  OT Diagnosis: abnormal posture, acute pain, and cognitive deficits, hemiplegia affected dominant side, coordination disorder Rehab Potential: Rehab Potential (ACUTE ONLY): Fair ELOS: 3-4 weeks   Today's Date: 05/31/2021 OT Individual Time: 0109-3235 OT Individual Time Calculation (min): 58 min     Hospital Problem: Principal Problem:   Thalamic hemorrhage ([place])   Past Medical History: History reviewed. No pertinent past medical history. Past Surgical History: History reviewed. No pertinent surgical history.  Assessment & Plan Clinical Impression: Marissa Morris is a 60 year old right-handed female with history of hypertension as well as back surgery.  Per chart review lives alone.  1 level home with ramped entrance.  Reportedly independent prior to admission.  She has a supportive fianc.  Presented 05/14/2021 on transfer from [facility] after presenting with right side weakness nausea vomiting facial droop with dysarthria.  Noted blood pressure 222/128.  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  Neurosurgery consulted Dr. Duffy Rhody and underwent right frontal ventriculostomy for hydrocephalus.  Patient was managed with hydralazine and nicardipine for blood pressure control.  Keppra was initially initiated for seizure prophylaxis and course completed.  Follow-up MRI/MRA showed no substantial change in thalamic hemorrhage with significant intraventricular extension mild hydrocephalus as well as mild edema and mass-effect compared to recent CT.  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  MRA was unremarkable and CT head completed 05/26/2021 showing interval removal of ventricular catheter with significant decrease in intraventricular hemorrhage.  No new hemorrhage or  worsening of mass-effect..  She was cleared to begin subcutaneous heparin for DVT prophylaxis 05/18/2021.  Echocardiogram with ejection fraction of 50 to 57% grade 2 diastolic dysfunction.  Admission chemistries unremarkable except glucose 117 WBC 12,800 urinalysis negative urine drug screen negative.  Patient maintained on Cleviprex initially for blood pressure control.  She was initially n.p.o. with alternative means of nutritional support and diet has been advanced to regular.  Bouts of initial agitation restlessness receiving Zyprexa via.  Patient with mild hyponatremia 128 132 since improved to 135 and monitored.  Therapy evaluations completed due to patient's right side weakness and dysarthria was admitted for a comprehensive rehab program.  Patient currently requires max with basic self-care skills secondary to muscle weakness, muscle joint tightness, and muscle paralysis, decreased cardiorespiratoy endurance, impaired timing and sequencing, abnormal tone, unbalanced muscle activation, decreased coordination, and decreased motor planning, decreased midline orientation and decreased attention to right, decreased initiation, decreased attention, decreased awareness, decreased problem solving, decreased safety awareness, and decreased memory, and decreased sitting balance, decreased postural control, hemiplegia, and decreased balance strategies.  Prior to hospitalization, patient could complete BADLs with independent .  Patient will benefit from skilled intervention to increase independence with basic self-care skills prior to discharge  home with family support .  Anticipate patient will require 24 hour supervision and minimal physical assistance and follow up home health.  OT - End of Session Endurance Deficit: Yes OT Assessment Rehab Potential (ACUTE ONLY): Fair OT Barriers to Discharge: Decreased caregiver support;Home environment access/layout OT Barriers to Discharge Comments: ?wheelchair  accessible home OT Patient demonstrates impairments in the following area(s): Balance;Behavior;Cognition;Endurance;Motor;Pain;Perception;Safety;Sensory;Vision OT Basic ADL's Functional Problem(s): Grooming;Bathing;Dressing;Toileting OT Advanced ADL's Functional Problem(s): Simple Meal Preparation OT Transfers Functional Problem(s): Toilet;Tub/Shower OT Additional Impairment(s): Fuctional Use of Upper Extremity OT Plan OT Intensity: Minimum of 1-2 x/day, 45 to  90 minutes OT Frequency: 5 out of 7 days OT Duration/Estimated Length of Stay: 3-4 weeks OT Treatment/Interventions: Balance/vestibular training;DME/adaptive equipment instruction;Patient/family education;Therapeutic Activities;Wheelchair propulsion/positioning;Cognitive remediation/compensation;Functional electrical stimulation;Psychosocial support;Therapeutic Exercise;UE/LE Strength taining/ROM;Self Care/advanced ADL retraining;Functional mobility training;Community reintegration;Discharge planning;Neuromuscular re-education;UE/LE Coordination activities;Splinting/orthotics;Pain management;Disease mangement/prevention;Visual/perceptual remediation/compensation OT Self Feeding Anticipated Outcome(s): No goal OT Basic Self-Care Anticipated Outcome(s): Supervision-Min A OT Toileting Anticipated Outcome(s): Min A OT Bathroom Transfers Anticipated Outcome(s): Min A OT Recommendation Recommendations for Other Services: Speech consult Patient destination: Home Follow Up Recommendations: Home health OT Equipment Recommended: To be determined   OT Evaluation Precautions/Restrictions  Precautions Precautions: Fall Precaution Comments: Rt hemiparesis with tone in UE/LE Restrictions Weight Bearing Restrictions: No  Home Living/Prior Functioning Home Living Family/patient expects to be discharged to:: Private residence Available Help at Discharge: Family, Friend(s) Type of Home: House Home Access: Ramped entrance Home Layout: One  level Bathroom Shower/Tub: Chiropodist: Standard Bathroom Accessibility: Yes Additional Comments: All home living/discharge setting information provided by chart, as pt states "I don't know" when asked these questions IADL History Homemaking Responsibilities:  (Unknown) Type of Occupation: Pt reports she doesn't know Leisure and Hobbies: Pt reports she doesn't know Prior Function Level of Independence: Independent with basic ADLs, Independent with gait (Per EMR)  Able to Take Stairs?:  (unknown) Driving:  (unknown) Vision Baseline Vision/History: No visual deficits Patient Visual Report:  (L gaze preference) Vision Assessment?: Yes Alignment/Gaze Preference: Gaze left;Head turned Perception  Perception: Impaired Inattention/Neglect: Does not attend to right visual field;Does not attend to right side of body Praxis Praxis: Impaired Praxis Impairment Details: Motor planning;Initiation;Perseveration Cognition Overall Cognitive Status: Impaired/Different from baseline Arousal/Alertness: Awake/alert Orientation Level: Person;Place (able to state that she was in a hospital but did not know which hospital she was in) Year: Other (Comment) [phone]) Month: September Day of Week: Incorrect Memory: Impaired Immediate Memory Recall: Sock;Blue;Bed Memory Recall Sock: Not able to recall Memory Recall Blue: Not able to recall Memory Recall Bed: Not able to recall Attention: Sustained Sustained Attention: Impaired Awareness: Impaired Awareness Impairment: Emergent impairment Problem Solving: Impaired Behaviors: Perseveration Safety/Judgment: Impaired Comments: Decreased insight into deficits during self care activity Sensation Sensation Light Touch: Impaired Detail Light Touch Impaired Details: Impaired RUE;Impaired RLE (Pt provides inconsistent responses with regard to light tough) Coordination Gross Motor Movements are Fluid and Coordinated: No Fine Motor  Movements are Fluid and Coordinated: No Coordination and Movement Description: Affected by Rt hemiparesis with abnormal tone as well as balance deficits Finger Nose Finger Test: Dysmetric on the Lt with pt having trouble performing test accurately due to cognition, unable to complete on the Rt side due to proximal weakness Motor  Motor Motor: Hemiplegia;Abnormal tone;Abnormal postural alignment and control  Trunk/Postural Assessment  Cervical Assessment Cervical Assessment: Exceptions to [facility] (forward head) Thoracic Assessment Thoracic Assessment: Exceptions to [facility] (rounded shoulders) Lumbar Assessment Lumbar Assessment: Exceptions to [facility] (posterior pelvic tilt) Postural Control Postural Control: Deficits on evaluation (Rt posterior lean/LOB during EOB self care activity)  Balance Balance Balance Assessed: Yes Dynamic Sitting Balance Dynamic Sitting - Balance Support: During functional activity Dynamic Sitting - Level of Assistance: 2: Max assist (donning shirt) Dynamic Standing Balance Dynamic Standing - Balance Support:  (unable to assess due to +2 assist not being available during eval) Extremity/Trunk Assessment RUE Assessment RUE Assessment: Exceptions to [facility] (Nonfunctional with flexor tone, proximal>distal weakness) LUE Assessment LUE Assessment: Within Functional Limits  Care Tool Care Tool Self Care Eating   Eating Assist Level: Supervision/Verbal cueing    Oral Care  Oral care, brush teeth, clean dentures activity did not occur: Refused      Bathing   Body parts bathed by patient: Chest;Abdomen;Left upper leg Body parts bathed by helper: Right arm;Left arm;Front perineal area;Buttocks;Right upper leg;Right lower leg;Left lower leg;Face   Assist Level: Maximal Assistance - Patient 24 - 49%    Upper Body Dressing(including orthotics)   What is the patient wearing?: Pull over shirt   Assist Level: Maximal Assistance - Patient 25 - 49%    Lower Body Dressing  (excluding footwear)   What is the patient wearing?: Incontinence brief;Pants Assist for lower body dressing: Maximal Assistance - Patient 25 - 49%    Putting on/Taking off footwear   What is the patient wearing?: Non-skid slipper socks;Ted hose Assist for footwear: Dependent - Patient 0%       Care Tool Toileting Toileting activity Toileting Activity did not occur (Clothing management and hygiene only): N/A (no void or bm)          Toilet transfer Toilet transfer activity did not occur: Safety/medical concerns (unsafe to attempt as +2 assist not available during eval)        Refer to Care Plan for Long Term Goals  SHORT TERM GOAL WEEK 1 OT Short Term Goal 1 (Week 1): Pt will complete LB dressing at sit<stand level with +2 assistance OT Short Term Goal 2 (Week 1): Pt will complete maintain sitting balance EOB during 1 ADL session with no more than Mod A OT Short Term Goal 3 (Week 1): Pt will complete 1 grooming task while sitting at the sink with Min A using adaptive strategies  Recommendations for other services: Other: SLP    Skilled Therapeutic Intervention Skilled OT session completed with focus on initial evaluation, education on OT role/POC, and establishment of patient-centered goals.   Pt greeted in bed with no c/o pain initially. Agreeable to tx. Supine<sit completed with Max A and vcs for sequencing. Noted that pt was able to move her Rt side minimally with tone in affected UE/LE. While EOB completing ADLs, pt required Max A for balance 95% of the time due to Rt posterior lean, very brief windows of time where she was able to maintain sitting balance with CGA. Hand over hand to use her Rt hand functionally with pt perseverating on using her nonfunctional Rt UE by herself. When asked if her right hand was working pt stated "no" but continued to try to use it at dominant level, even when cued to compensate with the Lt hand. Difficultly noted with Rt/Lt discrimination and  ?sensation. OT would touch Rt UE/LE asking pt to lift/move as able to and pt would always lift the Lt UE or LE. Lt gaze preference, Rt inattention, deficits with sustained attention to task, problem solving, and motor planning/clothing orientation. Asked PLOF questions and pt responded "I don't know" to each one, stated current year was 63. After donning shirt, pt indicated that her Lt side was hurting then gestured to her full body, initiated returning to supine. Max A for rolling towards the Lt side during LB self care, Mod A when rolling towards the Rt. Total A for Teds + footwear. Pt aware that she was in a hospital but unsure where, also disoriented to her medical situation. At end of session pt was left in bed with hemiplegic side protected, call bell within reach and bed alarm set. Notified NT that she was ready to eat her breakfast and would need full supervision.    ADL  ADL Eating: Not assessed Grooming: Moderate assistance Where Assessed-Grooming: Edge of bed Upper Body Bathing: Moderate assistance Where Assessed-Upper Body Bathing: Edge of bed Lower Body Bathing: Dependent Where Assessed-Lower Body Bathing: Edge of bed;Bed level Upper Body Dressing: Maximal assistance Where Assessed-Upper Body Dressing: Edge of bed Lower Body Dressing: Dependent Where Assessed-Lower Body Dressing: Edge of bed;Bed level Toileting: Not assessed Toilet Transfer: Not assessed Tub/Shower Transfer: Not assessed ADL Comments: Functional transfers unsafe to attempt during eval as no +2 assist was available Mobility  Bed Mobility Bed Mobility: Supine to Sit;Sit to Supine Supine to Sit: Maximal Assistance - Patient - Patient 25-49% Sit to Supine: Moderate Assistance - Patient 50-74% Transfers Sit to Stand: Maximal Assistance - Patient 25-49% Stand to Sit: Maximal Assistance - Patient 25-49%   Discharge Criteria: Patient will be discharged from OT if patient refuses treatment 3 consecutive times  without medical reason, if treatment goals not met, if there is a change in medical status, if patient makes no progress towards goals or if patient is discharged from hospital.  The above assessment, treatment plan, treatment alternatives and goals were discussed and mutually agreed upon: by patient  Skeet Simmer 05/31/2021, 12:44 PM

## 2021-05-31 NOTE — Progress Notes (Signed)
Inpatient Rehabilitation Medication Review by a Pharmacist   A complete drug regimen review was completed for this patient to identify any potential clinically significant medication issues.   Clinically significant medication issues were identified:  yes     Type of Medication Issue Identified Description of Issue Urgent (address now) Non-Urgent (address on AM team rounds) Plan Plan Accepted by Provider? (Yes / No / Pending AM Rounds)  Drug Interaction(s) (clinically significant)            Duplicate Therapy            Allergy            No Medication Administration End Date            Incorrect Dose            Additional Drug Therapy Needed   Aspirin PO 81 mg daily written on the acute admission d/c summary but not ordered in CIR   Multivitamin PO 1 tablet once daily and vitamin C PO [phone] mg once daily also on the acute admission d/c summary but not ordered in CIR. Non-Urgent Message MD Pending response  Other   Labetalol PO 200 mg BID written on the acute admission d/c summary. However, it was ordered TID while admitted and now in CIR. TID is appropriate, and the BID was likely a transcription error. Non-Urgent N/A N/A      Name of provider notified for urgent issues identified: N/A   Provider Method of Notification: Secure Chat   For non-urgent medication issues to be resolved on team rounds tomorrow morning a [facility] Secure Chat Handoff was sent to: Dr. Wynn Banker   Time spent performing this drug regimen review (minutes): 15 minutes     Sanda Klein, PharmD, RPh PGY-1 Pharmacy Resident 05/31/2021 1:26 PM   Please check [email or web address] for unit-specific pharmacy phone numbers.

## 2021-05-31 NOTE — Progress Notes (Signed)
Erick ColaceKirsteins, Andrew E, MD   Physician  Physical Medicine and Rehabilitation  Consult Note      Signed  Date of Service:  05/18/2021  5:48 AM       Related encounter: ED to Hosp-Admission (Discharged) from 05/13/2021 in [facility]       Signed      Expand All Collapse All                                                                                                                                                                                                                                                                                                                   Physical Medicine and Rehabilitation Consult Reason for Consult: Right side weakness with facial droop/dysarthria Referring Physician: Alcide Evenerr.Al Masry     HPI: Marissa Morris is a 60 y.o. right-handed female with history of hypertension, back surgery.  Per chart review patient lives alone.  1 level home ramped entrance.  Reportedly independent prior to admission.  [facility]20[facility] after presenting with right side weakness nausea vomiting facial droop with dysarthria.  Noted blood pressure 222/128.  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  Neurosurgery consulted underwent right frontal ventriculostomy for hydrocephalus.  Patient was managed with hydralazine and nicardipine for blood pressure control.  Keppra was initially initiated for seizure prophylaxis.  MRI/MRA showed no substantial change in left thalamic hemorrhage with significant intraventricular extension mild hydrocephalus as well as mild edema and mass-effect compared to recent CT.  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  MRA was unremarkable.  Echocardiogram with ejection fraction of 50 to 55% grade 2 diastolic  dysfunction.  Admission chemistries unremarkable except glucose 117, WBC 12,800, urinalysis negative, urine drug screen negative.  Patient currently remains on Cleviprex for blood pressure control.  N.p.o. with alternative means of nutritional support.  Patient with bouts of agitation and restlessness received Zyprexa via.  Therapy evaluations completed due to patient's right side weakness and dysarthria recommendations of physical medicine rehab consult. Fianc at bedside, he walks with a cane     Review of Systems Constitutional: Negative for chills and fever. HENT: Negative for hearing loss.   Eyes: Negative for blurred vision and double vision. Respiratory: Negative for cough and shortness of breath.   Cardiovascular: Negative for chest pain, palpitations and leg swelling. Gastrointestinal: Positive for constipation. Negative for heartburn, nausea and vomiting. Genitourinary: Negative for dysuria and hematuria. Musculoskeletal: Positive for back pain and myalgias. Skin: Negative for rash. Neurological: Positive for speech change, weakness and headaches.  All other systems reviewed and are negative.   History reviewed. No pertinent past medical history. History reviewed. No pertinent surgical history. No family history on file. Social History:  has no history on file for tobacco use, alcohol use, and drug use. Allergies:       Allergies  Allergen Reactions   Other Other (See Comments) and Cough      Pollen- Sneezing, also          Medications Prior to Admission  Medication Sig Dispense Refill   BAYER LOW DOSE 81 MG EC tablet Take 81 mg by mouth daily. Swallow whole.       Multiple Vitamins-Minerals (ONE-A-DAY WOMENS 50+) TABS Take 1 tablet by mouth daily.       vitamin C (ASCORBIC ACID) 500 MG tablet Take 500-1,000 mg by mouth daily.          Home: Home Living Family/patient expects to be discharged to:: Private residence Living Arrangements: Alone Type of Home: House Home  Access: Ramped entrance Home Layout: One level Bathroom Shower/Tub: Engineer, manufacturing systems: Standard Home Equipment: Bedside commode,Wheelchair - manual (bariatric w/c)  Functional History: Prior Function Level of Independence: Independent Comments: not working; assisting fiance as needed s/p his back surgeries. Functional Status:  Mobility: Bed Mobility Overal bed mobility: Needs Assistance Bed Mobility: Supine to Sit,Sit to Supine,Rolling Rolling: Min assist Supine to sit: Mod assist,+2 for physical assistance Sit to supine: Mod assist,+2 for physical assistance General bed mobility comments: MinA to roll towards right, pt initiating well, modA + 2 for management of RLE off edge of bed and for trunk control Transfers Overall transfer level: Needs assistance Equipment used: None Transfers: Sit to/from Stand Sit to Stand: Max assist,+2 physical assistance General transfer comment: MaxA + 2 to rise to stand from edge of bed x 2 with right knee block,   ADL: ADL Overall ADL's : Needs assistance/impaired Eating/Feeding: NPO Eating/Feeding Details (indicate cue type and reason): cortrak Grooming: Total assistance Upper Body Bathing: Total assistance Lower Body Bathing: Total assistance Upper Body Dressing : Total assistance Lower Body Dressing: Total assistance Toilet Transfer: Total assistance Toileting- Clothing Manipulation and Hygiene: Total assistance Functional mobility during ADLs: Moderate assistance,Total assistance,+2 for physical assistance,Caregiver able to provide necessary level of assistance General ADL Comments: Pt totalA for ADL at this time due to R side flaccid and 25% of command following. Pt turning head to left to see, but will not go beyond midline- requires tactile cues to turn head to R, btu vision stays looking L.   Cognition: Cognition Overall Cognitive Status: Difficult to assess Arousal/Alertness: Lethargic Orientation Level: Oriented to  person Attention: Sustained Sustained Attention: Impaired Sustained Attention Impairment: Functional basic,Verbal basic Memory:  (TBA) Awareness: Impaired Awareness Impairment: Emergent impairment Problem Solving:  (will assess further) Behaviors: Perseveration Safety/Judgment: Impaired Cognition Arousal/Alertness: Lethargic Behavior During Therapy: Flat affect Overall Cognitive  Status: Difficult to assess General Comments: Pt lethargic, able to arouse intermittently and open eyes briefly for 2-3 seconds, following 25% of 1 step commands. Pt with decreased arousal compared to SLP eval. Family in room attempting to arouse pt to their voice, but pt unable to keep eyes open long enough. Difficult to assess due to: Level of arousal   Blood pressure 132/86, pulse 71, temperature 98.2 F (36.8 C), temperature source Axillary, resp. rate 14, weight 77.7 kg, SpO2 (!) 89 %. Physical Exam Constitutional:      Appearance: She is ill-appearing.  HENT:    Head:     Comments: Right frontal ventriculostomy    Nose: Nose normal.     Comments: Core track feeding tube  Eyes:    Extraocular Movements: Extraocular movements intact.     Pupils: Pupils are equal, round, and reactive to light.  Cardiovascular:    Rate and Rhythm: Regular rhythm. Tachycardia present.     Heart sounds: Normal heart sounds. No murmur heard.    Pulmonary:    Effort: Pulmonary effort is normal. No respiratory distress.     Breath sounds: Normal breath sounds.  Abdominal:     General: Abdomen is flat. Bowel sounds are normal.     Palpations: Abdomen is soft.     Tenderness: There is no abdominal tenderness. Musculoskeletal:     Right lower leg: No edema.     Left lower leg: No edema.     Comments: No pain with upper limb or lower limb range of motion  Skin:    General: Skin is warm and dry.  Neurological:    Mental Status: She is lethargic.     Cranial Nerves: Dysarthria present.     Coordination: Coordination  abnormal.     Gait: Gait abnormal.     Comments: Patient is a bit lethargic but arousable.  Follows simple commands.  Left gaze preference.  Able to provide some simple word responses with perseveration.  Motor strength is trace right grip otherwise 0/5 right upper extremity trace hip knee extensor synergy on the right otherwise 0/5 Left side has at least 4/5 strength in the upper and lower limb, difficult to perform formal manual muscle testing secondary to lethargy Withdraws to pinch in all 4 limbs       Lab Results Last 24 Hours       Results for orders placed or performed during the hospital encounter of 05/13/21 (from the past 24 hour(s))  Glucose, capillary     Status: Abnormal    Collection Time: 05/17/21  8:31 AM  Result Value Ref Range    Glucose-Capillary 120 (H) 70 - 99 mg/dL    Comment 1 Notify RN      Comment 2 Document in Chart    Glucose, capillary     Status: Abnormal    Collection Time: 05/17/21 11:01 AM  Result Value Ref Range    Glucose-Capillary 109 (H) 70 - 99 mg/dL    Comment 1 Notify RN      Comment 2 Document in Chart    Urinalysis, Routine w reflex microscopic Urine, Catheterized     Status: None    Collection Time: 05/17/21 11:23 AM  Result Value Ref Range    Color, Urine YELLOW YELLOW    APPearance CLEAR CLEAR    Specific Gravity, Urine 1.021 1.005 - 1.030    pH 5.0 5.0 - 8.0    Glucose, UA NEGATIVE NEGATIVE mg/dL    Hgb urine dipstick NEGATIVE  NEGATIVE    Bilirubin Urine NEGATIVE NEGATIVE    Ketones, ur NEGATIVE NEGATIVE mg/dL    Protein, ur NEGATIVE NEGATIVE mg/dL    Nitrite NEGATIVE NEGATIVE    Leukocytes,Ua NEGATIVE NEGATIVE  Glucose, capillary     Status: Abnormal    Collection Time: 05/17/21  4:09 PM  Result Value Ref Range    Glucose-Capillary 127 (H) 70 - 99 mg/dL    Comment 1 Notify RN      Comment 2 Document in Chart    Glucose, capillary     Status: Abnormal    Collection Time: 05/17/21  7:51 PM  Result Value Ref Range     Glucose-Capillary 117 (H) 70 - 99 mg/dL  Glucose, capillary     Status: Abnormal    Collection Time: 05/18/21 12:00 AM  Result Value Ref Range    Glucose-Capillary 149 (H) 70 - 99 mg/dL  CBC     Status: Abnormal    Collection Time: 05/18/21  4:44 AM  Result Value Ref Range    WBC 10.9 (H) 4.0 - 10.5 K/uL    RBC 4.42 3.87 - 5.11 MIL/uL    Hemoglobin 14.1 12.0 - 15.0 g/dL    HCT 83.1 51.7 - 61.6 %    MCV 98.9 80.0 - 100.0 fL    MCH 31.9 26.0 - 34.0 pg    MCHC 32.3 30.0 - 36.0 g/dL    RDW 07.3 71.0 - 62.6 %    Platelets 184 150 - 400 K/uL    nRBC 0.0 0.0 - 0.2 %  Basic metabolic panel     Status: Abnormal    Collection Time: 05/18/21  4:44 AM  Result Value Ref Range    Sodium 138 135 - 145 mmol/L    Potassium 3.7 3.5 - 5.1 mmol/L    Chloride 100 98 - 111 mmol/L    CO2 29 22 - 32 mmol/L    Glucose, Bld 105 (H) 70 - 99 mg/dL    BUN 25 (H) 6 - 20 mg/dL    Creatinine, Ser 9.48 0.44 - 1.00 mg/dL    Calcium 8.4 (L) 8.9 - 10.3 mg/dL    GFR, Estimated >54 >62 mL/min    Anion gap 9 5 - 15       Imaging Results (Last 48 hours)  DG CHEST PORT 1 VIEW   Result Date: 05/17/2021 CLINICAL DATA:  Respiratory failure EXAM: PORTABLE CHEST 1 VIEW COMPARISON:  May 14, 2021 FINDINGS: Feeding tube tip is below the diaphragm. No pneumothorax. There is no edema or airspace opacity. There is cardiomegaly with pulmonary vascularity normal. No adenopathy. No bone lesions. IMPRESSION: No edema or airspace opacity. Cardiomegaly, stable. Feeding tube tip below diaphragm. Electronically Signed   By: Bretta Bang III M.D.   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 Yes Co-Morbidities requiring supervision/potential complications: Dysphagia, aphasia, right hemiparesis,  uncontrolled hypertension Due to bladder management, bowel management, safety, skin/wound care, disease management, medication administration, pain management and patient education, does the patient require 24 hr/day rehab nursing? Yes Does the patient require coordinated care of a physician, rehab nurse, therapy disciplines of PT, OT, speech to address physical and functional deficits in the context of the above medical diagnosis(es)? Yes Addressing deficits in the following areas: balance,  endurance, locomotion, strength, transferring, bowel/bladder control, bathing, dressing, feeding, grooming, toileting, cognition, speech, language, swallowing and psychosocial support Can the patient actively participate in an intensive therapy program of at least 3 hrs of therapy per day at least 5 days per week? Potentially The potential for patient to make measurable gains while on inpatient rehab is fair Anticipated functional outcomes upon discharge from inpatient rehab are min assist and mod assist  with PT, min assist and mod assist with OT, min assist and mod assist with SLP. Estimated rehab length of stay to reach the above functional goals is: 22 to 25 days Anticipated discharge destination: Home Overall Rehab/Functional Prognosis: fair   RECOMMENDATIONS: This patient's condition is appropriate for continued rehabilitative care in the following setting: CIR Patient has agreed to participate in recommended program. N/A Note that insurance prior authorization may be required for reimbursement for recommended care.   Comment: Patient not ready until ventriculostomy drain is out and blood pressure is controlled off IV medications, social work will need to talk to fianc in terms of his ability to care for patient     Charlton Amor, PA-C 05/18/2021    "I have personally performed a face to face diagnostic evaluation of this patient.  Additionally, I have reviewed and concur with the physician  assistant's documentation above." Erick Colace M.D. [facility] Fellow Am Acad of Phys Med and Rehab Diplomate Am Board of Electrodiagnostic Med Fellow Am Board of Interventional Pain              Revision History                        Routing History                   Note Details  Author Erick Colace, MD File Time 05/18/2021 11:44 AM  Author Type Physician Status Signed  Last Editor Erick Colace, MD Service Physical Medicine and Rehabilitation  Hospital Acct # [id number] Admit Date 05/30/2021

## 2021-06-01 LAB — COMPREHENSIVE METABOLIC PANEL
ALT: 38 U/L (ref 0–44)
AST: 36 U/L (ref 15–41)
Albumin: 3.4 g/dL — ABNORMAL LOW (ref 3.5–5.0)
Alkaline Phosphatase: 84 U/L (ref 38–126)
Anion gap: 12 (ref 5–15)
BUN: 12 mg/dL (ref 6–20)
CO2: 25 mmol/L (ref 22–32)
Calcium: 9.3 mg/dL (ref 8.9–10.3)
Chloride: 102 mmol/L (ref 98–111)
Creatinine, Ser: 0.61 mg/dL (ref 0.44–1.00)
GFR, Estimated: >60 mL/min (ref >60)
Glucose, Bld: 100 mg/dL — ABNORMAL HIGH (ref 70–99)
Potassium: 4 mmol/L (ref 3.5–5.1)
Sodium: 139 mmol/L (ref 135–145)
Total Bilirubin: 0.6 mg/dL (ref 0.3–1.2)
Total Protein: 7.1 g/dL (ref 6.5–8.1)

## 2021-06-01 LAB — CBC WITH DIFFERENTIAL/PLATELET
Abs Immature Granulocytes: 0.05 10*3/uL (ref 0.00–0.07)
Basophils Absolute: 0.1 10*3/uL (ref 0.0–0.1)
Basophils Relative: 1 %
Eosinophils Absolute: 0.2 10*3/uL (ref 0.0–0.5)
Eosinophils Relative: 3 %
HCT: 39.6 % (ref 36.0–46.0)
Hemoglobin: 12.8 g/dL (ref 12.0–15.0)
Immature Granulocytes: 1 %
Lymphocytes Relative: 16 %
Lymphs Abs: 1.2 10*3/uL (ref 0.7–4.0)
MCH: 31.8 pg (ref 26.0–34.0)
MCHC: 32.3 g/dL (ref 30.0–36.0)
MCV: 98.3 fL (ref 80.0–100.0)
Monocytes Absolute: 0.7 10*3/uL (ref 0.1–1.0)
Monocytes Relative: 9 %
Neutro Abs: 5.6 10*3/uL (ref 1.7–7.7)
Neutrophils Relative %: 70 %
Platelets: 369 10*3/uL (ref 150–400)
RBC: 4.03 MIL/uL (ref 3.87–5.11)
RDW: 12.9 % (ref 11.5–15.5)
WBC: 7.9 10*3/uL (ref 4.0–10.5)
nRBC: 0 % (ref 0.0–0.2)

## 2021-06-01 NOTE — Progress Notes (Signed)
Inpatient Rehabilitation Care Coordinator Assessment and Plan Patient Details  Name: Marissa Morris MRN: [id number] Date of Birth: [birth year]  Today's Date: 06/01/2021  Hospital Problems: Principal Problem:   Thalamic hemorrhage [facility])  Past Medical History: History reviewed. No pertinent past medical history. Past Surgical History: History reviewed. No pertinent surgical history. Social History:  reports that she has never smoked. She has never used smokeless tobacco. She reports that she does not drink alcohol and does not use drugs.  Family / Support Systems Marital Status: Single Spouse/Significant Other: N/A Children: No children Other Supports: sister Marissa Morris [phone]) Anticipated Caregiver: sister Marissa Morris and her sister's dtr in law Marissa Morris when [place] is working Ability/Limitations of Caregiver: Arts administrator works Careers adviser: 24/7 (Anticipated plan for pt to d/c to her sister Marissa Morris's home) Family Dynamics: Pt lives alone  Social History Preferred language: English Religion:  Cultural Background: Pt worked in IT sales professional: some college Read: Yes Write: Yes Employment Status: Employed Agricultural consultant: Pt unable to inform on name of employer due to some confusion and inability to verbalize name of employer Return to Work Plans: TBD Public relations account executive Issues: Denies Guardian/Conservator: N/A   Abuse/Neglect Abuse/Neglect Assessment Can Be Completed: Yes (some confusion) Physical Abuse: Denies Verbal Abuse: Denies Sexual Abuse: Denies Exploitation of patient/patient's resources: Denies Self-Neglect: Denies  Emotional Status Pt's affect, behavior and adjustment status: Pt presented with some confusion, and inability to answer questions correctly Recent Psychosocial Issues: Denies Psychiatric History: Denies Substance Abuse History: Denies  Patient / Family Perceptions, Expectations & Goals Pt/Family understanding of illness &  functional limitations: Will discuss furhter with family pt care needs Premorbid pt/family roles/activities: Independent Anticipated changes in roles/activities/participation: Assistance with ADLs/IADLs  Education officer, environmental Agencies: None Transportation available at discharge: Sister Armed forces operational officer referrals recommended: Neuropsychology  Discharge Planning Living Arrangements: Alone Support Systems: Other relatives Type of Residence: Private residence Insurance Resources: Multimedia programmer (specify) Nurse, mental health) Financial Resources: Employment Museum/gallery curator Screen Referred: No Living Expenses: Education officer, community Management: Patient Does the patient have any problems obtaining your medications?: No Home Management: Pt managed all home care Patient/Family Preliminary Plans: TBD Care Coordinator Barriers to Discharge: Decreased caregiver support, Lack of/limited family support Care Coordinator Anticipated Follow Up Needs: HH/OP  Clinical Impression SW covering for primary SW, [employer].   SW met with pt in room to introduce self, explain role, and discuss discharge process. Pt is not a English as a second language teacher. No DME.  Pt is aware primary SW will return tomorrow and speak with her sister as well. Further follow-up needed with pt sister Marissa Morris to confirm pt d/c plan.  Elzy Tomasello A Maor Meckel 06/01/2021, 2:45 PM

## 2021-06-01 NOTE — Plan of Care (Signed)
  Problem: Consults Goal: RH STROKE PATIENT EDUCATION Description: See Patient Education module for education specifics  Outcome: Progressing   Problem: RH BOWEL ELIMINATION Goal: RH STG MANAGE BOWEL WITH ASSISTANCE Description: STG Manage Bowel with  mod  I Assistance. Outcome: Progressing Goal: RH STG MANAGE BOWEL W/MEDICATION W/ASSISTANCE Description: STG Manage Bowel with Medication with mod I Assistance. Outcome: Progressing   Problem: RH BLADDER ELIMINATION Goal: RH STG MANAGE BLADDER WITH ASSISTANCE Description: STG Manage Bladder With min assist Assistance Outcome: Progressing   Problem: RH SKIN INTEGRITY Goal: RH STG ABLE TO PERFORM INCISION/WOUND CARE W/ASSISTANCE Description: STG Able To Perform Incision/Wound Care With  min Assistance. Outcome: Progressing   Problem: RH SAFETY Goal: RH STG ADHERE TO SAFETY PRECAUTIONS W/ASSISTANCE/DEVICE Description: STG Adhere to Safety Precautions With cues/r eminders Assistance/Device. Outcome: Progressing   Problem: RH PAIN MANAGEMENT Goal: RH STG PAIN MANAGED AT OR BELOW PT'S PAIN GOAL Description: At or below level 4 Outcome: Progressing   Problem: RH KNOWLEDGE DEFICIT Goal: RH STG INCREASE KNOWLEDGE OF DIABETES Description: Patient and family will be able to manage prediabetes with dietary modifications using handouts and educational tools independently Outcome: Progressing Goal: RH STG INCREASE KNOWLEDGE OF HYPERTENSION Description: Patient and family will be able to manage HTN with medications dietary modifications using handouts and educational tools independently Outcome: Progressing Goal: RH STG INCREASE KNOWLEGDE OF HYPERLIPIDEMIA Description: Patient and family will be able to manage HLD  with medications and  dietary modifications using handouts and educational tools independently Outcome: Progressing Goal: RH STG INCREASE KNOWLEDGE OF STROKE PROPHYLAXIS Description: Patient and family will be able to manage  secondary stroke risks with medications and dietary modifications using handouts and educational tools independently Outcome: Progressing   

## 2021-06-01 NOTE — Progress Notes (Signed)
Patient ID: Marissa Morris, female   DOB: 08/17/1961, 59 y.o.   MRN: [id number] Met with the patient to introduce self and the role of the nurse CM. Reviewed secondary stroke risks including HTN ( on 4 medications) and HLD (LDL 121) and prediabetes. Discussed DASH diet and prediabetes dietary modifications. Patient noted sister will be assisting her at discharge. Continue to follow along to discharge to address educational needs and collaborate with the SW to facilitate preparation for discharge. ,  B, RN  

## 2021-06-01 NOTE — Evaluation (Signed)
Speech Language Pathology Assessment and Plan  Patient Details  Name: [place] Oborn MRN: [id number] Date of Birth: 09-12-1961  SLP Diagnosis: Aphasia;Cognitive Impairments  Rehab Potential: Good ELOS: 3-4 weeks    Today's Date: 06/01/2021 SLP Individual Time: 5974-1638 SLP Individual Time Calculation (min): 60 min   Hospital Problem: Principal Problem:   Thalamic hemorrhage ([place])  Past Medical History: History reviewed. No pertinent past medical history. Past Surgical History: History reviewed. No pertinent surgical history.  Assessment / Plan / Recommendation Clinical Impression Marissa Morris is a 60 year old right-handed female with history of hypertension as well as back surgery.  Per chart review lives alone.  1 level home with ramped entrance.  Reportedly independent prior to admission.  She has a supportive fianc.  Presented 05/14/2021 on transfer from [facility] after presenting with right side weakness nausea vomiting facial droop with dysarthria.  Noted blood pressure 222/128.  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  Neurosurgery consulted Dr. Duffy Rhody and underwent right frontal ventriculostomy for hydrocephalus.  Patient was managed with hydralazine and nicardipine for blood pressure control.  Keppra was initially initiated for seizure prophylaxis and course completed.  Follow-up MRI/MRA showed no substantial change in thalamic hemorrhage with significant intraventricular extension mild hydrocephalus as well as mild edema and mass-effect compared to recent CT.  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  MRA was unremarkable and CT head completed 05/26/2021 showing interval removal of ventricular catheter with significant decrease in intraventricular hemorrhage.  No new hemorrhage or worsening of mass-effect..  She was cleared to begin subcutaneous heparin for DVT prophylaxis  05/18/2021.  Echocardiogram with ejection fraction of 50 to 45% grade 2 diastolic dysfunction.  Admission chemistries unremarkable except glucose 117 WBC 12,800 urinalysis negative urine drug screen negative.  Patient maintained on Cleviprex initially for blood pressure control.  She was initially n.p.o. with alternative means of nutritional support and diet has been advanced to regular.  Bouts of initial agitation restlessness receiving Zyprexa via.  Patient with mild hyponatremia 128 132 since improved to 135 and monitored.  Therapy evaluations completed due to patient's right side weakness and dysarthria was admitted for a comprehensive rehab program.   Pt presents with mild receptive and expressive aphasia and moderate cognitive deficits characterized impairments in semantic paraphasias at sentence level, impairments in reading at paragraph level and difficulty following 2 step commands. Cognitive deficits were In orientation, awareness of errors, memory and problem solving. She was able to answer simple and complex yes/no questions with increased time, name 10/10 objects with confrontational naming tasks, and follow simple single step commands. Speech is fluent with semantic paraphasias neologisms and intermittent word finding difficulty.  She required max A to orient to date and day of the week.She will benefit from skilled SLP intervention to increase receptive and expressive communication and cognition.     Skilled Therapeutic Interventions          SLP evaluation completed. Plan of care and goals reviewed with patient. She was in agreement with plan.   SLP Assessment  Patient will need skilled Speech Lanaguage Pathology Services during CIR admission    Recommendations  Medication Administration: Whole meds with liquid Compensations: Minimize environmental distractions Postural Changes and/or Swallow Maneuvers: Seated upright 90 degrees Oral Care Recommendations: Oral care BID Patient  destination: Home Follow up Recommendations: Home Health SLP Equipment Recommended: None recommended by SLP    SLP Frequency 3 to 5 out  of 7 days   SLP Duration  SLP Intensity  SLP Treatment/Interventions 3-4 weeks  Minumum of 1-2 x/day, 30 to 90 minutes  Cognitive remediation/compensation;Patient/family education;Speech/Language facilitation;Therapeutic Exercise    Pain Pain Assessment Pain Scale: Faces Faces Pain Scale: No hurt  Prior Functioning Cognitive/Linguistic Baseline: Within functional limits Type of Home: House  Lives With: Other (Comment) (mother) Available Help at Discharge:  (not known. Pt unsure at this time.) Vocation: Full time employment  SLP Evaluation Cognition Overall Cognitive Status: Impaired/Different from baseline Arousal/Alertness: Awake/alert Orientation Level: Oriented to person Attention: Sustained Sustained Attention: Impaired Sustained Attention Impairment: Functional basic;Verbal basic Memory: Impaired Memory Impairment: Retrieval deficit Awareness: Impaired Awareness Impairment: Emergent impairment Problem Solving: Impaired Problem Solving Impairment: Functional basic;Verbal basic Executive Function: Sequencing Sequencing: Impaired Sequencing Impairment: Verbal basic;Functional basic Behaviors: Perseveration Safety/Judgment: Impaired Comments: Decreased insight into deficits.  Comprehension Auditory Comprehension Overall Auditory Comprehension: Impaired Yes/No Questions: Impaired Basic Biographical Questions: 76-100% accurate Basic Immediate Environment Questions: 75-100% accurate Complex Questions: 50-74% accurate Commands: Impaired One Step Basic Commands: 75-100% accurate Two Step Basic Commands: 50-74% accurate Complex Commands: 25-49% accurate Conversation: Simple Interfering Components: Attention EffectiveTechniques: Extra processing time;Repetition Visual Recognition/Discrimination Discrimination: Not  tested Reading Comprehension Reading Status: Impaired Word level: Within functional limits Sentence Level: Within functional limits Paragraph Level: Impaired Functional Environmental (signs, name badge): Not tested Effective Techniques: Verbal cueing;Visual cueing Expression Expression Primary Mode of Expression: Verbal Verbal Expression Overall Verbal Expression: Impaired Initiation: No impairment Level of Generative/Spontaneous Verbalization: Sentence Repetition: Impaired Level of Impairment: Sentence level Naming: Impairment Responsive: 51-75% accurate Confrontation: Impaired Convergent: 50-74% accurate Divergent: 50-74% accurate Verbal Errors: Semantic paraphasias;Neologisms;Perseveration;Not aware of errors Pragmatics: No impairment Written Expression Dominant Hand: Right Written Expression: Unable to assess (comment) (right hemiplegia) Oral Motor Oral Motor/Sensory Function Overall Oral Motor/Sensory Function: Within functional limits  Care Tool Care Tool Cognition Expression of Ideas and Wants Expression of Ideas and Wants: Some difficulty - exhibits some difficulty with expressing needs and ideas (e.g, some words or finishing thoughts) or speech is not clear   Understanding Verbal and Non-Verbal Content Understanding Verbal and Non-Verbal Content: Sometimes understands - understands only basic conversations or simple, direct phrases. Frequently requires cues to understand   Memory/Recall Ability *first 3 days only           Short Term Goals: Week 1: SLP Short Term Goal 1 (Week 1): Pt will increase word finding at sentence level to min A with verbal cues. SLP Short Term Goal 2 (Week 1): Pt will increase written comprehension at sentence level to min A visual and verbal cues. SLP Short Term Goal 3 (Week 1): Pt will orient to date and location with visual aids x85% min A. SLP Short Term Goal 4 (Week 1): Pt will follow 2 step commands in 70% of opportunities with  mod A verbal and visual cues.  Refer to Care Plan for Long Term Goals  Recommendations for other services: None   Discharge Criteria: Patient will be discharged from SLP if patient refuses treatment 3 consecutive times without medical reason, if treatment goals not met, if there is a change in medical status, if patient makes no progress towards goals or if patient is discharged from hospital.  The above assessment, treatment plan, treatment alternatives and goals were discussed and mutually agreed upon: by patient  [place] 06/01/2021, 8:26 AM

## 2021-06-01 NOTE — Progress Notes (Signed)
Physical Therapy Session Note  Patient Details  Name: Marissa Morris MRN: [id number] Date of Birth: Nov 20, 1961  Today's Date: 06/01/2021 PT Individual Time: 0915-1029 PT Individual Time Calculation (min): 74 min   Short Term Goals: Week 1:  PT Short Term Goal 1 (Week 1): Pt will perform bed mobility with modA. PT Short Term Goal 2 (Week 1): Pt will perform bed to chair transfer with modA. PT Short Term Goal 3 (Week 1): Pt will perform sit to stand with modA. PT Short Term Goal 4 (Week 1): Pt will ambulate x25' with maxA and LRAD.  Skilled Therapeutic Interventions/Progress Updates:  Patient supine in bed on entrance to room. Patient alert and agreeable to PT session. Patient denied pain throughout session. Trunkal and R sided ataxia/ hypotonicity noted throughout session.   No w/c available in room and so appropriately sized w/c with cushion located and provided for pt and to use in session.   Therapeutic Activity: Bed Mobility: Patient performed supine <> sit several times during dressing and to position safely during retrieval of w/c for safe pt transport. First supine--> sit required Mod A for UB to reach seated position on EOB with pt able to bring BLE to EOB with CGA. VC/ tc required for pushing up using R elbow and L hand. Next two supine --> sit transitions performed with improving BLE positioning and rise to sit with Mod/ min A, then Min/ CGA.   Initially pt requires Mod to Max A to maintain upright sitting position on EOB with LOB posteriorly and to R side. With time and vc/ tc along with focused dressing task, pt improves balance to CGA with intermittent need for Min A. Pt is determined to don TED hose and grip sock to L foot. Requires vc to remove grip sock first. Uses L hand to remove with LLE in figure four positioning. With setup of orientation of sock, she is able to initiate donning of TED hose over toes and pulls up to mid calf requiring Min A for balance only. Then Max A to  complete. Initiates don of grip sock and again requires Max A to complete when R hand is unable to effectively grip sock. With intermittent Min A for balance and LUE grasped to foot rail of bed, TED hose and grip sock donned to RLE with max A for time.   Sit--> supine performed with overall Mod A  and pt able to assist with positioning toward [facility] and use of bed controls with Mod/ max A.  Transfers: STS performed at EOB to complete donning of pants. Requires two attempts for full upright stance. Pt requires block to R knee to prevent buckling and block to R shoulder for upright posturing. Completes to upright with Max A. Improved to Mod/ max A in second attempt.   Patient performed lateral scoot along EOB toward L side with Min A requiring positioning of RLE and Min A for UB balance. Then Max/ Mod A to complete squat pivot transfer to w/c in 2 efforts. VC/ tc for hand positioning, forward lean, and technique.   Blocked practice of STS in parallel bars in therapy gym for sequencing and effort x4. VC/ tc for hand/ feet positioning positioning, forward lean, effort, and technique. Gait belt low round hips and Airex pad for block to Bil knees for improved posturing and maintaining positioning of R knee and foot.   Neuromuscular Re-ed: NMR facilitated during session with focus on motor control, muscle facilitation, midline orientation, sitting/ standing balance. Pt  guided in sitting balance and proprioception of body orientation while seated EOB. Pt is able to respond to 75% of vc for need to adjust posture and able to initiate correction. Sometimes overcorrects/ demos ataxic movements. LOB noted posteriorly and to R side.   Standing balance and tolerance with focus on reaching upright posture. Muscle facilitation techniques applied to glute max and QL as well as vc for shoulders. Continued bodily flexor positioning overall despite L sided ability to reach extension. Improves with repetition and alignment of  hip extension over knee extension. VC for looking up with level gaze and shoulders back with improving upright positioning for UB throughout trials. Fatigue noted and trials ended prior to breakdown of quality of movement. NMR performed for improvements in motor control and coordination, balance, sequencing, judgement, and self confidence/ efficacy in performing all aspects of mobility at highest level of independence.   Huntley Dec Plus trial for potential use with staff for toileting in bathroom. With correct positioning of belt lower around hips and RUE in arm trough/ RLE at knee support, pt positions well and is able to rise to more upright posture. Pericare/ LB clothing is more easily managed with use. Continue training with Huntley Dec Plus prior to release to use with staff. Potential use of STEDY with improved trunk extensor movements.   Patient supine in bed at end of session with brakes locked, bed alarm set, and all needs within reach. Pt oriented to time and length of break before next therapy session with OT.      Therapy Documentation Precautions:  Precautions Precautions: Fall Precaution Comments: Rt hemiparesis with tone in UE/LE Restrictions Weight Bearing Restrictions: No  Therapy/Group: Individual Therapy  Loel Dubonnet PT, DPT 06/01/2021, 4:33 PM

## 2021-06-01 NOTE — Progress Notes (Signed)
Inpatient Rehabilitation  Patient information reviewed and entered into eRehab system by Aedin Jeansonne Romey Cohea, OTR/L.   Information including medical coding, functional ability and quality indicators will be reviewed and updated through discharge.    

## 2021-06-01 NOTE — Progress Notes (Signed)
Occupational Therapy Session Note  Patient Details  Name: Marissa Morris MRN: [id number] Date of Birth: [birth year]  Today's Date: 06/01/2021 OT Individual Time: 1155-2080 OT Individual Time Calculation (min): 55 min    Short Term Goals: Week 1:  OT Short Term Goal 1 (Week 1): Pt will complete LB dressing at sit<stand level with +2 assistance OT Short Term Goal 2 (Week 1): Pt will complete maintain sitting balance EOB during 1 ADL session with no more than Mod A OT Short Term Goal 3 (Week 1): Pt will complete 1 grooming task while sitting at the sink with Min A using adaptive strategies  Skilled Therapeutic Interventions/Progress Updates:    Pt received in room and bed and consented to OT tx. Pt made it known that she wanted to get washed up, and was offered a shower. Pt req max A to sit EOB and maintain sitting balance. Then used Marissa Morris to transfer pt from EOB to heavy duty BSC in shower. Pt req max A for bathing thoroughness and min A with cues to maintain upright posture while sitting in shower. Once finished, pt req max A to don UB clothes, used Marissa Morris to take back to EOB and donned brief and pants with max A. To hike, pt laid down with assist for BLEs and was able to bridge to hike on L side, but needed to roll to L side to hike R side of pants. After tx, pt left semifowler in bed with bed alarm on and all needs met.   Therapy Documentation Precautions:  Precautions Precautions: Fall Precaution Comments: Rt hemiparesis with tone in UE/LE Restrictions Weight Bearing Restrictions: No  Vital Signs: Therapy Vitals Temp: 98.4 F (36.9 C) Temp Source: Oral Pulse Rate: 88 Resp: 19 BP: (!) 147/69 Patient Position (if appropriate): Lying Oxygen Therapy SpO2: 99 % O2 Device: Room Air Pain: none     Therapy/Group: Individual Therapy  Marissa Morris 06/01/2021, 8:02 AM

## 2021-06-01 NOTE — Progress Notes (Signed)
Initial Nutrition Assessment  DOCUMENTATION CODES:   Not applicable  INTERVENTION:  Provide Ensure Enlive po BID, each supplement provides 350 kcal and 20 grams of protein  Encourage adequate PO intake.   NUTRITION DIAGNOSIS:   Increased nutrient needs related to  (therapy) as evidenced by estimated needs.  GOAL:   Patient will meet greater than or equal to 90% of their needs  MONITOR:   PO intake, Supplement acceptance, Skin, Weight trends, Labs, I & O's  REASON FOR ASSESSMENT:   Malnutrition Screening Tool    ASSESSMENT:   60 year old female with history of hypertension presenting with right side weakness nausea vomiting facial droop with dysarthria. 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 Pt underwent right frontal ventriculostomy for hydrocephalus. Due to patient's right side weakness and dysarthria admitted to CIR.  Pt unavailable during attempted time of contact. Meal completion has been 85-90% today. Pt currently has Ensure ordered and has been consuming them. RD to continue with current orders to aid in caloric and protein needs. Unable to complete Nutrition-Focused physical exam at this time.   Labs and medications reviewed.   Diet Order:   Diet Order             Diet regular Room service appropriate? Yes with Assist; Fluid consistency: Thin  Diet effective now                   EDUCATION NEEDS:   Not appropriate for education at this time  Skin:  Skin Assessment: Reviewed RN Assessment  Last BM:  6/19  Height:   Ht Readings from Last 1 Encounters:  05/30/21 5\' 3"  (1.6 m)    Weight:   Wt Readings from Last 1 Encounters:  05/30/21 72.5 kg   BMI:  Body mass index is 28.31 kg/m.  Estimated Nutritional Needs:   Kcal:  1800-2000  Protein:  90-100 grams  Fluid:  >/= 1.8 L/day  06/01/21, MS, RD, LDN RD pager number/after hours weekend pager number on Amion.

## 2021-06-02 NOTE — Progress Notes (Signed)
PROGRESS NOTE   Subjective/Complaints:  Speech doing better today , working with SLP   Review of systems unable to obtain secondary to aphasia  Objective:   DG Knee 1-2 Views Right  Result Date: 05/31/2021 CLINICAL DATA:  Knee pain no injury EXAM: RIGHT KNEE - 1-2 VIEW COMPARISON:  None. FINDINGS: Advanced tricompartmental degenerative change with joint space narrowing and extensive spurring in all 3 compartments. Moderate joint effusion. Negative for fracture. IMPRESSION: Advanced tricompartmental degenerative change.  Joint effusion. Electronically Signed   By: Marlan Palau M.D.   On: 05/31/2021 13:22   Recent Labs    06/01/21 0717  WBC 7.9  HGB 12.8  HCT 39.6  PLT 369   Recent Labs    06/01/21 0717  NA 139  K 4.0  CL 102  CO2 25  GLUCOSE 100*  BUN 12  CREATININE 0.61  CALCIUM 9.3    Intake/Output Summary (Last 24 hours) at 06/02/2021 0840 Last data filed at 06/02/2021 0750 Gross per 24 hour  Intake 480 ml  Output --  Net 480 ml         Physical Exam: Vital Signs Blood pressure (!) 141/78, pulse 73, temperature 98 F (36.7 C), resp. rate 18, height 5\' 3"  (1.6 m), weight 72.5 kg, SpO2 100 %.   General: No acute distress Mood and affect are appropriate Heart: Regular rate and rhythm no rubs murmurs or extra sounds Lungs: Clear to auscultation, breathing unlabored, no rales or wheezes Abdomen: Positive bowel sounds, soft nontender to palpation, nondistended Extremities: No clubbing, cyanosis, or edema Skin: No evidence of breakdown, no evidence of rash  Neurologic: Cranial nerves II through XII intact, motor strength is 5/5 in left and 3- right deltoid, bicep, tricep, grip, hip flexor, knee extensors, ankle dorsiflexor and plantar flexor   Musculoskeletal: No pain with ankle range of motion on the right there is pain with knee range of motion.  Knee without erythema, minimal  effusion   Assessment/Plan: 1. Functional deficits which require 3+ hours per day of interdisciplinary therapy in a comprehensive inpatient rehab setting. Physiatrist is providing close team supervision and 24 hour management of active medical problems listed below. Physiatrist and rehab team continue to assess barriers to discharge/monitor patient progress toward functional and medical goals  Care Tool:  Bathing    Body parts bathed by patient: Chest, Abdomen, Left upper leg, Face, Left arm, Right arm   Body parts bathed by helper: Front perineal area, Buttocks, Right upper leg, Right lower leg, Left lower leg     Bathing assist Assist Level: Maximal Assistance - Patient 24 - 49%     Upper Body Dressing/Undressing Upper body dressing   What is the patient wearing?: Pull over shirt    Upper body assist Assist Level: Maximal Assistance - Patient 25 - 49%    Lower Body Dressing/Undressing Lower body dressing      What is the patient wearing?: Incontinence brief, Pants     Lower body assist Assist for lower body dressing: Maximal Assistance - Patient 25 - 49%     Toileting Toileting Toileting Activity did not occur (Clothing management and hygiene only): N/A (no void or bm)  Toileting assist Assist for toileting: Maximal Assistance - Patient 25 - 49%     Transfers Chair/bed transfer  Transfers assist     Chair/bed transfer assist level: Maximal Assistance - Patient 25 - 49%     Locomotion Ambulation   Ambulation assist      Assist level: 2 helpers Assistive device: Hand held assist (L handrail with +2 WC follow) Max distance: 5'   Walk 10 feet activity   Assist  Walk 10 feet activity did not occur: Safety/medical concerns        Walk 50 feet activity   Assist Walk 50 feet with 2 turns activity did not occur: Safety/medical concerns         Walk 150 feet activity   Assist Walk 150 feet activity did not occur: Safety/medical concerns          Walk 10 feet on uneven surface  activity   Assist Walk 10 feet on uneven surfaces activity did not occur: Safety/medical concerns         Wheelchair     Assist Will patient use wheelchair at discharge?: No             Wheelchair 50 feet with 2 turns activity    Assist            Wheelchair 150 feet activity     Assist          Blood pressure (!) 141/78, pulse 73, temperature 98 F (36.7 C), resp. rate 18, height 5\' 3"  (1.6 m), weight 72.5 kg, SpO2 100 %.   Medical Problem List and Plan: 1.  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  Status post right frontal ventriculostomy for hydrocephalus             -patient may  shower             -ELOS/Goals: 22 to 25 days- cont CIR PT, OT, SLP Team conf in am  2.  Antithrombotics: -DVT/anticoagulation: Subcutaneous heparin initiated 05/18/2021             -antiplatelet therapy: N/A 3. 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 Mood: Provigil 100 mg daily             -antipsychotic agents: N/A 5. Neuropsych: This patient is capable of making decisions on her own behalf. 6. Skin/Wound Care: Routine skin checks 7. Fluids/Electrolytes/Nutrition: Routine in and outs with follow-up chemistries 8.  Seizure prophylaxis.  Keppra completed 9.  Hypertension.  Norvasc 10 mg daily, hydralazine 20 mg every 8 hours, lisinopril 40 mg daily, labetalol 200 mg 3 times daily.  Monitor with increased mobility Vitals:   06/01/21 2233 06/02/21 0523  BP: 128/65 (!) 141/78  Pulse: 75 73  Resp:  18  Temp:  98 F (36.7 C)  SpO2:  100%  Controlled, 06/02/2021 10.  Hyperglycemia.  Hemoglobin A1c 5.7.  SSI    LOS: 3 days A FACE TO FACE EVALUATION WAS  PERFORMED  06/04/2021 06/02/2021, 8:40 AM

## 2021-06-02 NOTE — Plan of Care (Signed)
  Problem: Consults Goal: RH STROKE PATIENT EDUCATION Description: See Patient Education module for education specifics  Outcome: Progressing   Problem: RH BOWEL ELIMINATION Goal: RH STG MANAGE BOWEL WITH ASSISTANCE Description: STG Manage Bowel with  mod  I Assistance. Outcome: Progressing Goal: RH STG MANAGE BOWEL W/MEDICATION W/ASSISTANCE Description: STG Manage Bowel with Medication with mod I Assistance. Outcome: Progressing   Problem: RH BLADDER ELIMINATION Goal: RH STG MANAGE BLADDER WITH ASSISTANCE Description: STG Manage Bladder With min assist Assistance Outcome: Progressing   Problem: RH SKIN INTEGRITY Goal: RH STG ABLE TO PERFORM INCISION/WOUND CARE W/ASSISTANCE Description: STG Able To Perform Incision/Wound Care With  min Assistance. Outcome: Progressing   Problem: RH SAFETY Goal: RH STG ADHERE TO SAFETY PRECAUTIONS W/ASSISTANCE/DEVICE Description: STG Adhere to Safety Precautions With cues/r eminders Assistance/Device. Outcome: Progressing   Problem: RH PAIN MANAGEMENT Goal: RH STG PAIN MANAGED AT OR BELOW PT'S PAIN GOAL Description: At or below level 4 Outcome: Progressing   Problem: RH KNOWLEDGE DEFICIT Goal: RH STG INCREASE KNOWLEDGE OF DIABETES Description: Patient and family will be able to manage prediabetes with dietary modifications using handouts and educational tools independently Outcome: Progressing Goal: RH STG INCREASE KNOWLEDGE OF HYPERTENSION Description: Patient and family will be able to manage HTN with medications dietary modifications using handouts and educational tools independently Outcome: Progressing Goal: RH STG INCREASE KNOWLEGDE OF HYPERLIPIDEMIA Description: Patient and family will be able to manage HLD  with medications and  dietary modifications using handouts and educational tools independently Outcome: Progressing Goal: RH STG INCREASE KNOWLEDGE OF STROKE PROPHYLAXIS Description: Patient and family will be able to manage  secondary stroke risks with medications and dietary modifications using handouts and educational tools independently Outcome: Progressing

## 2021-06-02 NOTE — Progress Notes (Signed)
Physical Therapy Session Note  Patient Details  Name: Marissa Morris MRN: [id number] Date of Birth: September 13, 1961  Today's Date: 06/02/2021 PT Individual Time:  1300-1400  PT Individual Time Calculation (min): 60 min   Short Term Goals: Week 1:  PT Short Term Goal 1 (Week 1): Pt will perform bed mobility with modA. PT Short Term Goal 2 (Week 1): Pt will perform bed to chair transfer with modA. PT Short Term Goal 3 (Week 1): Pt will perform sit to stand with modA. PT Short Term Goal 4 (Week 1): Pt will ambulate x25' with maxA and LRAD.  Skilled Therapeutic Interventions/Progress Updates:  Patient supine in bed on entrance to room. Husband present. PA in room addressing pt's pain complaint re: bodily muscle aches. Patient alert and agreeable to PT session but verbally perseverates on pain levels low when she finds a comfortable spot in bed and doesn't move. PA recommends muscle ache cream and if necessary, may prescribe muscle relaxer or low grade pain medication. RN arrives to apply muscle cream to upper legs, knees, back and neck. Pt emotionally labile during application. Not due to pain, but agrees that this is all "just too much".  Therapeutic Activity: Bed Mobility: Pt supine and requires Max A this day to don TED hose, grip socks and pants.   Patient performed supine --> sit with max encouragement for RLE toward EOB requiring Mod A to complete. Then Mod A for UB with push of LUE from bed surface. Once seated EOB, +2 required initially to maintain sitting balance.  VC/ tc required for technique throughout.  Request from husband to bring pt's personal items including toiletries and clothing for easy donning. Rubber soled, laced athletic shoes in the event that pt will need AFO. Husband to bring on Thu. Transfers: Patient performed squat pivot to L from bed to w/c with setup and instructions requiring Mod A to complete and +2 for safety. VC/ tc for hand placement and progression, foot placement,  forward lean. STS transfers performed with max A +2  with decreased strength and motor control requiring hard block to R knee and hip to promote upright posturing. Provided verbal cues for holding head up, shoulders back, glute squeeze. No muscle activation palpated in R glutes.   Neuromuscular Re-ed: NMR facilitated during session with focus on sitting balance, standing balance. Pt guided in task of finding midline as well as finding upright posture while seated EOB. + 2 behind pt for LOB. Improved balance from yesterday with fewer instances of posterior and R sided LOB. Seated in w/c, pt guided in forward lean with return to upright unsupported sitting.  Standing balance performed with Max A +2 for safety. Facilitation provided to quads/ glutes for improved extension to R hemibody. Vc/ tc re: use of L side to rise to full extension and allow the R to follow. Able to stand for up to 2 min bouts. Heavy block to R knee and hip at glutes for R sided extension. NMR performed for improvements in motor control and coordination, balance, sequencing, judgement, and self confidence/ efficacy in performing all aspects of mobility at highest level of independence.   Per OT request, patient seated upright  in w/c at end of session with brakes locked, belt alarm set, and all needs within reach and ontray table placed in front of pt. Pt oriented to time and before start of next session with OT.     Therapy Documentation Precautions:  Precautions Precautions: Fall Precaution Comments: Rt hemiparesis with  tone in UE/LE Restrictions Weight Bearing Restrictions: No  Therapy/Group: Individual Therapy  Loel Dubonnet PT, DPT 06/02/2021, 7:56 AM

## 2021-06-02 NOTE — Progress Notes (Signed)
Occupational Therapy Session Note  Patient Details  Name: Marissa Morris MRN: [id number] Date of Birth: 1960-12-18  Today's Date: 06/02/2021 OT Individual Time: 7948-0165 and 1445-1510  OT Individual Time Calculation (min): 57 min and 25 min   Short Term Goals: Week 1:  OT Short Term Goal 1 (Week 1): Pt will complete LB dressing at sit<stand level with +2 assistance OT Short Term Goal 2 (Week 1): Pt will complete maintain sitting balance EOB during 1 ADL session with no more than Mod A OT Short Term Goal 3 (Week 1): Pt will complete 1 grooming task while sitting at the sink with Min A using adaptive strategies  Skilled Therapeutic Interventions/Progress Updates:    Pt greeted at time of session supine in bed semireclined agreeable to OT session, wanting to take a shower. No pain throughout. Pt perseverating at beginning of session on a bad morning she had, unable to verbalize what happened but upset from the morning. Redirected pt to tasks throughout, however pt pleasant and cooperative. Supine > sit Max A for trunk and LLE. Sitting EOB with Min A and Set up at [place] plus. Sara lift transfer EOB > shower bench and when sitting on bench, pt noted to have lethargic episodes that appeared pre syncopal, however pt able to recover and carry on conversation. Pt stating she feels "off today." Terminated shower activity and [place] transfer > wheelchair. BP checked and 111/76 and no c/o dizziness. Set up at sink for UB bathe/dress with Max A for overhead shirt with hemitechnique. Donned brief while pt standing in [place] lift before transferring to bed. Declined pants. Sit > supine Max A. Alarm on call bell in reach. RN aware of symptoms.   Session 2: Pt greeted at time of session sitting up in chair (had been for 45 mins from PT) and wanting to get back to bed. C/o feeling "woozy" and BP checked sitting: 107/85. Agreeable to trial standing in [place], after demonstration from therapist sit > stand in [place] with 2+  assist, Max A and severe flexion posture in stedy with difficulty getting into hip extension, Max A to achieve. However once sitting on paddles able to sit with Min/CGA for approx 1-2 minutes before standing again to lower to bed. Sit > supine Max A. Alarm on call bell in reach.   Therapy Documentation Precautions:  Precautions Precautions: Fall Precaution Comments: Rt hemiparesis with tone in UE/LE Restrictions Weight Bearing Restrictions: No    Therapy/Group: Individual Therapy  Erasmo Score 06/02/2021, 7:16 AM

## 2021-06-02 NOTE — Progress Notes (Signed)
Speech Language Pathology Daily Session Note  Patient Details  Name: Marissa Morris MRN: [id number] Date of Birth: Jun 13, 1961  Today's Date: 06/02/2021 SLP Individual Time: 9794-8016 SLP Individual Time Calculation (min): 45 min  Short Term Goals: Week 1: SLP Short Term Goal 1 (Week 1): Pt will increase word finding at sentence level to min A with verbal cues. SLP Short Term Goal 2 (Week 1): Pt will increase written comprehension at sentence level to min A visual and verbal cues. SLP Short Term Goal 3 (Week 1): Pt will orient to date and location with visual aids x85% min A. SLP Short Term Goal 4 (Week 1): Pt will follow 2 step commands in 70% of opportunities with mod A verbal and visual cues.  Skilled Therapeutic Interventions: Pt seen for skilled ST with focus on cognitive goals. Pt participating in orientation task, +DOB, -age, - month, -day, +year (said 22nd meaning 2022). Pt did perseverate on "September" when asked DOW and month, able to self correct with mod cues for awareness. Pt provided calendar to aid in recall and carryover of orientation information, SLP marking admission to [facility], transfer to CIR and today's date. Pt able to carryover orientation information after ~8 min delay provided min cues. Pt continues with semantic paraphasia and episodes of language of confusion. Pt becoming mildly anxious at times stating "I think I'm going backwards, I go forward then backwards", reassured via education/explanation of POC during rehab stay. SLP initiating memory notebook for pt to recall daily events, diagnosis of CVA, therapeutic tasks and temporal orientation concepts. Pt very appreciative of external aids to increase recall. Pt left in bed with alarm set and all needs within reach. Cont ST POC.  Pain Pain Assessment Pain Scale: 0-10 Pain Score: 0-No pain  Therapy/Group: Individual Therapy  Tacey Ruiz 06/02/2021, 9:04 AM

## 2021-06-02 NOTE — IPOC Note (Signed)
Overall Plan of Care [facility]) Patient Details Name: Marissa Morris MRN: [id number] DOB: 12-Jan-1961  Admitting Diagnosis: Thalamic hemorrhage [facility])  Hospital Problems: Principal Problem:   Thalamic hemorrhage (HCC)     Functional Problem List: Nursing Bladder, Bowel, Endurance, Medication Management, Safety, Skin Integrity  PT Balance, Behavior, Endurance, Motor, Pain, Perception, Safety, Sensory  OT Balance, Behavior, Cognition, Endurance, Motor, Pain, Perception, Safety, Sensory, Vision  SLP Linguistic, Cognition  TR         Basic ADL's: OT Grooming, Bathing, Dressing, Toileting     Advanced  ADL's: OT Simple Meal Preparation     Transfers: PT Bed Mobility, Bed to Chair, Car, Occupational psychologist, Research scientist (life sciences): PT Ambulation, Educational psychologist, Psychologist, prison and probation services     Additional Impairments: OT Fuctional Use of Upper Extremity  SLP None      TR      Anticipated Outcomes Item Anticipated Outcome  Self Feeding No goal  Swallowing      Basic self-care  Supervision-Min A  Toileting  Min A   Bathroom Transfers Min A  Bowel/Bladder  manage bowel with mod I assist and bladder with min assist  Transfers  MinA  Locomotion  MinA household distances  Communication  supervision for rec and expressive language  Cognition  min A  Pain  pain at or below level 4  Safety/Judgment  maintain safety wtih cues/reminders   Therapy Plan: PT Intensity: Minimum of 1-2 x/day ,45 to 90 minutes PT Frequency: 5 out of 7 days PT Duration Estimated Length of Stay: 3-4 Weeks OT Intensity: Minimum of 1-2 x/day, 45 to 90 minutes OT Frequency: 5 out of 7 days OT Duration/Estimated Length of Stay: 3-4 weeks SLP Intensity: Minumum of 1-2 x/day, 30 to 90 minutes SLP Frequency: 3 to 5 out of 7 days SLP Duration/Estimated Length of Stay: 3-4 weeks   Due to the current state of emergency, patients may not be receiving their 3-hours of Medicare-mandated therapy.   Team  Interventions: Nursing Interventions Patient/Family Education, Pain Management, Bowel Management, Skin Care/Wound Management, Medication Management, Disease Management/Prevention, Bladder Management, Discharge Planning  PT interventions Ambulation/gait training, Community reintegration, DME/adaptive equipment instruction, Neuromuscular re-education, Psychosocial support, Wheelchair propulsion/positioning, Stair training, UE/LE Strength taining/ROM, Warden/ranger, Discharge planning, Functional electrical stimulation, Pain management, Skin care/wound management, Therapeutic Activities, UE/LE Coordination activities, Cognitive remediation/compensation, Disease management/prevention, Functional mobility training, Patient/family education, Splinting/orthotics, Therapeutic Exercise, Visual/perceptual remediation/compensation  OT Interventions Warden/ranger, DME/adaptive equipment instruction, Patient/family education, Therapeutic Activities, Wheelchair propulsion/positioning, Cognitive remediation/compensation, Functional electrical stimulation, Psychosocial support, Therapeutic Exercise, UE/LE Strength taining/ROM, Self Care/advanced ADL retraining, Functional mobility training, Community reintegration, Discharge planning, Neuromuscular re-education, UE/LE Coordination activities, Splinting/orthotics, Pain management, Disease mangement/prevention, Visual/perceptual remediation/compensation  SLP Interventions Cognitive remediation/compensation, Patient/family education, Speech/Language facilitation, Therapeutic Exercise  TR Interventions    SW/CM Interventions Discharge Planning, Psychosocial Support, Patient/Family Education   Barriers to Discharge MD  Medical stability  Nursing Decreased caregiver support 1level ramped entry with fiance and mother/dtr  PT Decreased caregiver support    OT Decreased caregiver support, Home environment access/layout ?wheelchair accessible home   SLP      SW Decreased caregiver support, Lack of/limited family support     Team Discharge Planning: Destination: PT-Home ,OT- Home , SLP-Home Projected Follow-up: PT-Home health PT, 24 hour supervision/assistance, OT-  Home health OT, SLP-Home Health SLP Projected Equipment Needs: PT-To be determined, OT- To be determined, SLP-None recommended by SLP Equipment Details: PT- , OT-  Patient/family involved in discharge planning: PT- Patient,  OT-Patient, SLP-   MD ELOS:  60 year old right-handed female with history of hypertension as well as back surgery.  Per chart review lives alone.  1 level home with ramped entrance.  Reportedly independent prior to admission.  She has a supportive fianc.  Presented 05/14/2021 on transfer from [facility] after presenting with right side weakness nausea vomiting facial droop with dysarthria.  Noted blood pressure 222/128.  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  Neurosurgery consulted Dr. Hoyt Koch and underwent right frontal ventriculostomy for hydrocephalus.  Patient was managed with hydralazine and nicardipine for blood pressure control.  Keppra was initially initiated for seizure prophylaxis and course completed.  Follow-up MRI/MRA showed no substantial change in thalamic hemorrhage with significant intraventricular extension mild hydrocephalus as well as mild edema and mass-effect compared to recent CT.  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  MRA was unremarkable and CT head completed 05/26/2021 showing interval removal of ventricular catheter with significant decrease in intraventricular hemorrhage.  No new hemorrhage or worsening of mass-effect..  She was cleared to begin subcutaneous heparin for DVT prophylaxis 05/18/2021.  Echocardiogram with ejection fraction of 50 to 55% grade 2 diastolic dysfunction.  Admission chemistries unremarkable except  glucose 117 WBC 12,800 urinalysis negative urine drug screen negative.  Patient maintained on Cleviprex initially for blood pressure control.  She was initially n.p.o. with alternative means of nutritional support and diet has been advanced to regular.  Bouts of initial agitation restlessness receiving Zyprexa via.  Patient with mild hyponatremia 128 132 since improved to 135 and monitored.  Therapy evaluations completed due to patient's right side weakness and dysarthria was admitted for a comprehensive rehab program. Medical Rehab Prognosis:  Good Assessment: 22-25d    See Team Conference Notes for weekly updates to the plan of care

## 2021-06-03 MED ORDER — ACETAMINOPHEN 325 MG PO TABS
650.0000 mg | ORAL_TABLET | Freq: Four times a day (QID) | ORAL | Status: DC | PRN
  Administered 2021-06-03 – 2021-06-23 (×37): 650 mg via ORAL
  Filled 2021-06-03 (×38): qty 2

## 2021-06-03 NOTE — Progress Notes (Signed)
Physical Therapy Session Note  Patient Details  Name: Marissa Morris MRN: [id number] Date of Birth: 1961/10/23  Today's Date: 06/03/2021 PT Individual Time: 1035-1130 and 1400-1430 PT Individual Time Calculation (min): 55 min and 30 min   Short Term Goals: Week 1:  PT Short Term Goal 1 (Week 1): Pt will perform bed mobility with modA. PT Short Term Goal 2 (Week 1): Pt will perform bed to chair transfer with modA. PT Short Term Goal 3 (Week 1): Pt will perform sit to stand with modA. PT Short Term Goal 4 (Week 1): Pt will ambulate x25' with maxA and LRAD.   Skilled Therapeutic Interventions/Progress Updates:  Session 1   Pt received sitting in WC and agreeable to PT. Pt transported to rehab gym in [facility]. Stedy transfer. To mat table with mod assist to achieve full standing and cues for improved cervical and thoracic extension.   Sitting balance EOB to perform cross body and lateral reaches to obtain horse shoes and then toss to target on the floor. Min assist overall for improved weight shifting R and L as well as min-mod assist to stabilize the RUE to force WB through RUE when reaching with the LUE. Hand over hand assist to improved sequencing and activation of RUE for reaching R and L.   Squat pivot and SB transfers x 2 Bil with mod-max assist intermittently with max multimodal instruction for sequencing, improved posture, use of RUE/RLE and head hips relationship.  Standing frame performed x 2, 5 min +2 min. Cues for midline orientation intermittently. Pt performed partial squat x 8 on first bout with mod assist to improve activation of the RLE into extension. Lateral reach with the LUE and forward reach with the RUE on second bout in standing.     Pt returned to room and performed SB transfer to bed with max assist due to poor motor planning. Sit>supine completed with mod assist for RUE/RLE control, and left supine in bed with call bell in reach and all needs met.   Session 2  Pt received  supine in bed and agreeable to PT. Supine>sit transfer with mod assist and moderate cues for use of log roll technique to push into sitting.  Stedy transfer to [facility] with max assist to achieve full standing. In standing PT pulled pants with total A to manage clothing as well as mod assist to maintain balance in standing. Pt transported to rail in hall outside therapy gym. Sit<>stand x 2 with mod-max assist and max cues for BLE positioning,  improved activation of hip extension and to improve cervical and thoracic extension in standing. Attempted to perform stepping task at rail, but pain and fear of falling limited ability to accept weight through the RLE. Pt returned to room and performed SB transfer to bed with total A due to near fall from poor motor planning and poor head/hips relationship. Sit>supine completed with mod assist for BLE control. Pt left supine in bed with call bell in reach and all needs met.        Therapy Documentation Precautions:  Precautions Precautions: Fall Precaution Comments: Rt hemiparesis with tone in UE/LE Restrictions Weight Bearing Restrictions: No  Pain: Pain Assessment Pain Scale: 0-10 Pain Score: 10-Worst pain ever Pain Location: Back Pain Intervention(s): RN made aware    Therapy/Group: Individual Therapy  Lorie Phenix 06/03/2021, 12:32 PM

## 2021-06-03 NOTE — Progress Notes (Signed)
PIV in posterior right hand found on bed.  Old dressing removed and site found to be clean and dry.  No signs of inflammation noted.

## 2021-06-03 NOTE — Progress Notes (Signed)
Patient ID: Marissa Morris, female   DOTeam Conference Report to [employer] discussion was reviewed with the patient and caregiver, including goals, any changes in plan of care and target discharge date.  Patient and caregiver express understanding and are in agreement.  The patient has a target discharge date of 06/25/21.   SW met with pt and called sister at bedside. No answer from sister, LEFT VM. Patient reports pain after therapy. No other questions or concerns, sw will cont to follow up Marissa Morris 06/03/2021, 1:14 PM B: May 13, 1961, 60 y.o.   MRN: [id number]

## 2021-06-03 NOTE — Patient Care Conference (Signed)
Inpatient RehabilitationTeam Conference and Plan of Care Update Date: 06/03/2021   Time: 10:10 AM    Patient Name: Marissa Morris      Medical Record Number: [id number]  Date of Birth: [birth year] Sex: Female         Room/Bed: 2I94W/5I62V-03 Payor Info: Payor: BLUE CROSS BLUE SHIELD / Plan: BCBS OTHER / Product Type: *No Product type* /    Admit Date/Time:  05/30/2021  4:15 PM  Primary Diagnosis:  Thalamic hemorrhage [facility])  Hospital Problems: Principal Problem:   Thalamic hemorrhage [facility])    Expected Discharge Date: Expected Discharge Date: 06/25/21  Team Members Present: Physician leading conference: Dr. Claudette Laws Care Coodinator Present: Chana Bode, RN, BSN, CRRN;Christina Vita Barley, BSW Nurse Present: Chana Bode, RN PT Present: Grier Rocher, PT OT Present: Other (comment) Annye English, OT) SLP Present: Other (comment) Fae Pippin, SLP) PPS Coordinator present : Fae Pippin, SLP     Current Status/Progress Goal Weekly Team Focus  Bowel/Bladder   Incontinent of b/b. LBM 6/21  Regain continenence  Toilet q2hr during day and q3hr at hs   Swallow/Nutrition/ Hydration             ADL's   (based on Caretool) Max UB/LB dress with hemitechnqiue, bed level for LB dress, showering shower level with Huntley Dec + lift, Max A to wash LB, Huntley Dec + for transfers, R hemi w/ good movement in RUE but poor awareness and coordination  S for grooming, min A for ADL/transfers  sit <> stands when able, sitting balance/core, ADL retraining, RUE NMR, hemitechniques, decreasing R lean   Mobility   Bed mobility = Mod/ Max A; Squat Pivot to L = Mod A, STS = Mod/ Max A +2, staff transfers = sara +; no gait  Bed mobility = SUP; Transfers = Min A; Gait = Min A for 69ft with LRAD  NMR for R motor control, bed mobility, transfers, standing tolerance/ balance, gait with improved standing tolerance, w/c mobility   Communication   Mod-Max A  Supervision  word finding at sentence and  conversational level,  Comprehension of 2 step instructions   Safety/Cognition/ Behavioral Observations  Mod-MaxA  Min A  orientation, use of external aids to assist with recall   Pain   Moderate pain to bilateral knees  Pain <3/10  Assess Qshift and PRN   Skin   Closed head incision. Remainder of skin intact.  Maintain skin integrity.  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 Mild aphasia with semantic paraphasia and episodes of language of confusion and mod cognitive deficits; word finding difficulties. BP controlled. Nursing reports patient is sleepy; monitoring for need of neuro-stimulant.   Patient on target to meet rehab goals: yes, currently requires min assist for upper body care and max assist for lower body care. Incorporating right upper extremity into functional tasks; working on activation and coordination. Max assist - mod assist +2 for sit - stand due to right lean. Completed squat pivot transfers with min assist. Min assist goals set (household distance ambulation) for discharge with PT/OT and supervision - min assist goals set for SLP  *See Care Plan and progress notes for long and short-term goals.   Revisions to Treatment Plan:    Teaching Needs:  Medications, secondary stroke risk management, transfers, safety, etc.  Current Barriers to Discharge: Decreased caregiver support, Home enviroment access/layout,  and Incontinence  Possible Resolutions to Barriers: Family education     Medical Summary Current Status: Aphasia improving, incont bowel and  bladder ,  Barriers to Discharge: Medical stability   Possible Resolutions to [employer] Focus: work on bowel and bladder cont   Continued Need for Acute Rehabilitation Level of Care: The patient requires daily medical management by a physician with specialized  training in physical medicine and rehabilitation for the following reasons: Direction of a multidisciplinary physical rehabilitation program to maximize functional independence : Yes Medical management of patient stability for increased activity during participation in an intensive rehabilitation regime.: Yes Analysis of laboratory values and/or radiology reports with any subsequent need for medication adjustment and/or medical intervention. : Yes   I attest that I was present, lead the team conference, and concur with the assessment and plan of the team.   Chana Bode B 06/03/2021, 11:43 AM

## 2021-06-03 NOTE — Progress Notes (Signed)
PROGRESS NOTE   Subjective/Complaints:  Seen in therapy , difficulty with sensation and motor control RUE, speech mild/mod aphasia   Review of systems unable to obtain secondary to aphasia  Objective:   No results found. Recent Labs    06/01/21 0717  WBC 7.9  HGB 12.8  HCT 39.6  PLT 369    Recent Labs    06/01/21 0717  NA 139  K 4.0  CL 102  CO2 25  GLUCOSE 100*  BUN 12  CREATININE 0.61  CALCIUM 9.3     Intake/Output Summary (Last 24 hours) at 06/03/2021 0930 Last data filed at 06/03/2021 0830 Gross per 24 hour  Intake 960 ml  Output --  Net 960 ml         Physical Exam: Vital Signs Blood pressure 106/65, pulse 76, temperature 98.5 F (36.9 C), temperature source Oral, resp. rate 18, height '5\' 3"'  (1.6 m), weight 72.5 kg, SpO2 100 %.  General: No acute distress Mood and affect are appropriate Heart: Regular rate and rhythm no rubs murmurs or extra sounds Lungs: Clear to auscultation, breathing unlabored, no rales or wheezes Abdomen: Positive bowel sounds, soft nontender to palpation, nondistended Extremities: No clubbing, cyanosis, or edema Skin: No evidence of breakdown, no evidence of rash  Neurologic: Cranial nerves II through XII intact, motor strength is 5/5 in left and 3- right deltoid, bicep, tricep, grip, hip flexor, knee extensors, ankle dorsiflexor and plantar flexor   Musculoskeletal: No pain with ankle range of motion on the right there is pain with knee range of motion.  Knee without erythema, minimal effusion   Assessment/Plan: 1. Functional deficits which require 3+ hours per day of interdisciplinary therapy in a comprehensive inpatient rehab setting. Physiatrist is providing close team supervision and 24 hour management of active medical problems listed below. Physiatrist and rehab team continue to assess barriers to discharge/monitor patient progress toward functional and medical  goals  Care Tool:  Bathing    Body parts bathed by patient: Chest, Abdomen, Left upper leg, Face, Left arm, Right arm   Body parts bathed by helper: Front perineal area, Buttocks, Right upper leg, Right lower leg, Left lower leg     Bathing assist Assist Level: Maximal Assistance - Patient 24 - 49%     Upper Body Dressing/Undressing Upper body dressing   What is the patient wearing?: Pull over shirt    Upper body assist Assist Level: Maximal Assistance - Patient 25 - 49%    Lower Body Dressing/Undressing Lower body dressing      What is the patient wearing?: Incontinence brief, Pants     Lower body assist Assist for lower body dressing: Maximal Assistance - Patient 25 - 49%     Toileting Toileting Toileting Activity did not occur (Clothing management and hygiene only): N/A (no void or bm)  Toileting assist Assist for toileting: Maximal Assistance - Patient 25 - 49%     Transfers Chair/bed transfer  Transfers assist     Chair/bed transfer assist level: Dependent - mechanical lift (Stedy)     Locomotion Ambulation   Ambulation assist      Assist level: 2 helpers Assistive device: Hand held assist (  L handrail with +2 WC follow) Max distance: 5'   Walk 10 feet activity   Assist  Walk 10 feet activity did not occur: Safety/medical concerns        Walk 50 feet activity   Assist Walk 50 feet with 2 turns activity did not occur: Safety/medical concerns         Walk 150 feet activity   Assist Walk 150 feet activity did not occur: Safety/medical concerns         Walk 10 feet on uneven surface  activity   Assist Walk 10 feet on uneven surfaces activity did not occur: Safety/medical concerns         Wheelchair     Assist Will patient use wheelchair at discharge?: No             Wheelchair 50 feet with 2 turns activity    Assist            Wheelchair 150 feet activity     Assist          Blood pressure  106/65, pulse 76, temperature 98.5 F (36.9 C), temperature source Oral, resp. rate 18, height '5\' 3"'  (1.6 m), weight 72.5 kg, SpO2 100 %.   Medical Problem List and Plan: 1.  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  Status post right frontal ventriculostomy for hydrocephalus             -patient may  shower             -ELOS/Goals: 22 to 25 days- cont CIR PT, OT, SLP Team conference today please see physician documentation under team conference tab, met with team  to discuss problems,progress, and goals. Formulized individual treatment plan based on medical history, underlying problem and comorbidities.  2.  Antithrombotics: -DVT/anticoagulation: Subcutaneous heparin initiated 05/18/2021             -antiplatelet therapy: N/A 3. 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 Mood: Provigil 100 mg daily             -antipsychotic agents: N/A 5. Neuropsych: This patient is capable of making decisions on her own behalf. 6. Skin/Wound Care: Routine skin checks 7. Fluids/Electrolytes/Nutrition: Routine in and outs with follow-up chemistries 8.  Seizure prophylaxis.  Keppra completed 9.  Hypertension.  Norvasc 10 mg daily, hydralazine 20 mg every 8 hours, lisinopril 40 mg daily, labetalol 200 mg 3 times daily.  Monitor with increased mobility Vitals:   06/02/21 1951 06/03/21 0528  BP: 116/74 106/65  Pulse: 74 76  Resp: 17 18  Temp: 98.9 F (37.2 C) 98.5 F (36.9 C)  SpO2: 98% 100%  Controlled, 06/03/2021 10.  Hyperglycemia.  Hemoglobin A1c 5.7.  SSI    LOS: 4 days A FACE TO FACE EVALUATION WAS PERFORMED  Marissa Morris 06/03/2021, 9:30 AM

## 2021-06-03 NOTE — Progress Notes (Signed)
Speech Language Pathology Daily Session Note  Patient Details  Name: Marissa Morris MRN: [id number] Date of Birth: Sep 30, 1961  Today's Date: 06/03/2021 SLP Individual Time: 1132-1200 SLP Individual Time Calculation (min): 28 min  Short Term Goals: Week 1: SLP Short Term Goal 1 (Week 1): Pt will increase word finding at sentence level to min A with verbal cues. SLP Short Term Goal 2 (Week 1): Pt will increase written comprehension at sentence level to min A visual and verbal cues. SLP Short Term Goal 3 (Week 1): Pt will orient to date and location with visual aids x85% min A. SLP Short Term Goal 4 (Week 1): Pt will follow 2 step commands in 70% of opportunities with mod A verbal and visual cues.  Skilled Therapeutic Interventions: Pt seen for skilled ST with focus on cognition and expressive aphasia. Pt c/o "unbearable" pain in back, RN notified and is aware. Pt repositioned in bed with minor relief. Pt requiring min cues to utilize external aids to increase orientation to time, states she is in the hospital because of "surgery" however able to increase awareness to CVA with cued use of memory notebook. SLP facilitating simple problem solving for current environment by providing mod fading to min A verbal cues to recall current cognitive and physical impairments and their impact. Simple word finding exercise (naming and comparing/contrasting pictures) requiring mod A verbal cues, some neologisms noted during task. Memory notebook completed and pt left in bed with alarm set and all needs within reach. Cont ST POC.  Pain Pain Assessment Pain Scale: 0-10 Pain Score: 10-Worst pain ever Pain Location: Back Pain Intervention(s): RN made aware  Therapy/Group: Individual Therapy  Tacey Ruiz 06/03/2021, 11:53 AM

## 2021-06-03 NOTE — Progress Notes (Signed)
Patient ID: Marissa Morris, female   DOB: 1961-03-17, 60 y.o.   MRN: [id number]  Team Conference Report to Patient/Family  Team Conference discussion was reviewed with the patient and caregiver, including goals, any changes in plan of care and target discharge date.  Patient and caregiver express understanding and are in agreement.  The patient has a target discharge date of 06/25/21.  SW spoke to patient sister. Provided updates, confirmed she will provided 24/7 for patient. No additional questions or concerns sw will cont to follow up.  Andria Rhein 06/03/2021, 3:51 PM

## 2021-06-03 NOTE — Progress Notes (Signed)
Occupational Therapy Session Note  Patient Details  Name: Marissa Morris MRN: [id number] Date of Birth: 30-Jan-1961  Today's Date: 06/03/2021 OT Individual Time: 6803-2122 OT Individual Time Calculation (min): 39 min    Short Term Goals: Week 1:  OT Short Term Goal 1 (Week 1): Pt will complete LB dressing at sit<stand level with +2 assistance OT Short Term Goal 2 (Week 1): Pt will complete maintain sitting balance EOB during 1 ADL session with no more than Mod A OT Short Term Goal 3 (Week 1): Pt will complete 1 grooming task while sitting at the sink with Min A using adaptive strategies  Skilled Therapeutic Interventions/Progress Updates:    Pt received semi-reclined in bed finishing breakfast, c/o pain all over, but otherwise unable to quantify, but, agreeable to therapy. RN present for med administration and aware of pain. Session focus on self-care retraining. Came to sitting EOB with overall mod A to progress RLE off bed + lift trunk with use of elevated HOB. Maintained static sitting with intermittent min A to prevent R lean. STS in stedy with overall min A + 2 for safety to power up from elevated HOB, mod A to prevent R lean. Req mod Vcs to maintain B hands on horizontal bar. Seated in w/c at sink, completed UBB with overall min A to incorporate RUE. Pt demonstrates good initiation of RUE into functional tasks. Donned shirt with min A to thread RUE and donned pants with overall max A + 2 for STS. She is able to assist by pulling pants over thighs + waist. Req total A to state activities completed in session for SLP memory notebook.  Pt left seated in w/c with rehab tech present awaiting following PT session, call bell in reach, and all immediate needs met.    Therapy Documentation Precautions:  Precautions Precautions: Fall Precaution Comments: Rt hemiparesis with tone in UE/LE Restrictions Weight Bearing Restrictions: No  Pain: c/o L knee pain, RN aware and present to apply Voltaren  gel   ADL: See Care Tool for more details.   Therapy/Group: Individual Therapy  Volanda Napoleon MS, OTR/L  06/03/2021, 6:51 AM

## 2021-06-03 NOTE — Progress Notes (Signed)
Patient ID: Marissa Morris, female   DOB: [birth year], 60 y.o.   MRN: [id number]  Sw made 2x attempts to call pt sister. Left VM

## 2021-06-03 NOTE — Progress Notes (Signed)
Physical Therapy Session Note  Patient Details  Name: Marissa Morris MRN: [id number] Date of Birth: 22-Oct-1961  Today's Date: 06/03/2021 PT Individual Time: 0626-9485 PT Individual Time Calculation (min): 44 min   Short Term Goals: Week 1:  PT Short Term Goal 1 (Week 1): Pt will perform bed mobility with modA. PT Short Term Goal 2 (Week 1): Pt will perform bed to chair transfer with modA. PT Short Term Goal 3 (Week 1): Pt will perform sit to stand with modA. PT Short Term Goal 4 (Week 1): Pt will ambulate x25' with maxA and LRAD.  Skilled Therapeutic Interventions/Progress Updates:  Patient seated upright in w/c on entrance to room. Rehab Tech present following OT session just prior.Patient alert and agreeable to PT session. Focused on Patient denied pain during session.  Therapeutic Activity: Transfers: Patient performed Squat pivot bed --> w/c with Min/ Mod A +2. Verbal and visual cues provided prior to performance for technique and pt able to follow given instructions.   Neuromuscular Re-ed: NMR facilitated during session with focus onstanding tolerance balance, NM facilitation, functional muscle activation. Pt guided in STS training in parallel bars. Pt able to reach forward with vc and Airex pad placed to block knees with therapist's knees to prevent R knee buckle. Pt rises to stand with Min A +2. Good pull with LUE and full trunk extension into upright stance with pt's L side. Standing tolerance >34min including weight shifting with increased time spent to R side. Second rise to stand with increased effortand Mod A +2 with pt rising to forward trunk flexion. With vc and Min A, pt rises to upright posture. Support only provided to R knee for block to prevent buckle. Pt feels weaker this bout and able to maintain for <40min. Third bout shorter with rise to stand requiring Mod A +2 and pt stating increased fatigue and need to sit within .   Seated focus on muscle activation and facilitation  and motor control of RLE. Guided in AAROM and progressing to AROM LAQs and marches x12 each LE. NMR performed for improvements in motor control and coordination, balance, sequencing, judgement, and self confidence/ efficacy in performing all aspects of mobility at highest level of independence.   Patient seated upright  in w/c at end of session with brakes locked, bed alarm set, and all needs within reach. Pt oriented to time and that there is one hour prior to next session with PT. Memory book updated.   Therapy Documentation Precautions:  Precautions Precautions: Fall Precaution Comments: Rt hemiparesis with tone in UE/LE Restrictions Weight Bearing Restrictions: No  Therapy/Group: Individual Therapy  Loel Dubonnet PT, DPT 06/03/2021, 12:34 PM

## 2021-06-04 MED ORDER — GABAPENTIN 100 MG PO CAPS
100.0000 mg | ORAL_CAPSULE | Freq: Two times a day (BID) | ORAL | Status: DC
  Administered 2021-06-04 – 2021-06-05 (×3): 100 mg via ORAL
  Filled 2021-06-04 (×3): qty 1

## 2021-06-04 NOTE — Progress Notes (Signed)
Physical Therapy Session Note  Patient Details  Name: Marissa Morris MRN: [id number] Date of Birth: Feb 19, 1961  Today's Date: 06/04/2021 PT Individual Time: 0830-0930 PT Individual Time Calculation (min): 60 min   Short Term Goals: Week 1:  PT Short Term Goal 1 (Week 1): Pt will perform bed mobility with modA. PT Short Term Goal 2 (Week 1): Pt will perform bed to chair transfer with modA. PT Short Term Goal 3 (Week 1): Pt will perform sit to stand with modA. PT Short Term Goal 4 (Week 1): Pt will ambulate x25' with maxA and LRAD.  Skilled Therapeutic Interventions/Progress Updates:    Patient in w/c following OT session. Initially no complaints, but as session progressed c/o R lower leg/ankle pain.  Patient assisted in w/c to general gym.  Performed slide board transfer with mod A to mat with cues for hand placement and weight shift.  Seated pt doffed shoe with min to mod A to check out R foot.  MD in to assess pt and checked out R foot, feels it is neuropathic pain.  Patient needing mod to min A for reaching to floor for shoe adjustment due to R and lateral LOB without awareness or attempt to correct unless cued/assisted.  Patient sit to stand using chair back for L UE support with mirror in front for awareness of R LE position and head position.  Mod to max A for sit to stand with R knee blocking.  Patient standing with A for R LE stability in stance for unilateral step on L fwd/back and assistance for R stepping/placement.  Patient cued throughout for forward gaze into mirror for more upright posture due to tendency to look down and collapse on R side  Standing lateral weight shifts with mod A and max cues.  Patient performing 3 standing bouts with up to 2 minutes standing.    Patient at wall rail in hallway and mirror in front sit to stand mod A and ambulated 2 x 7' with +2 A support under R hip and over knee for extension in stance when stepping with L and cues for L step length as well as assist  for R foot placement.  Patient cued throughout to use mirror for posture and to visualize R knee extension in stance.  Patient propelled w/c x 70' with min A using hemi-technique with max cues.  Patient assisted to room in w/c and left sitting with seat belt alarm active and all needs in reach.   Therapy Documentation Precautions:  Precautions Precautions: Fall Precaution Comments: Rt hemiparesis with tone in UE/LE Restrictions Weight Bearing Restrictions: No  Pain: Pain Assessment Pain Scale: 0-10 Pain Score: 5  Faces Pain Scale: Hurts even more Pain Type: Acute pain Pain Location: Ankle Pain Orientation: Right Pain Descriptors / Indicators: Aching Pain Onset: Progressive Pain Intervention(s): Distraction;Repositioned    Therapy/Group: Individual Therapy  Elray Mcgregor [place], [place] 06/04/2021, 12:07 PM

## 2021-06-04 NOTE — Progress Notes (Signed)
Speech Language Pathology Daily Session Note  Patient Details  Name: Marissa Morris MRN: [id number] Date of Birth: May 20, 1961  Today's Date: 06/04/2021 SLP Individual Time: 1105-1200 SLP Individual Time Calculation (min): 55 min  Short Term Goals: Week 1: SLP Short Term Goal 1 (Week 1): Pt will increase word finding at sentence level to min A with verbal cues. SLP Short Term Goal 2 (Week 1): Pt will increase written comprehension at sentence level to min A visual and verbal cues. SLP Short Term Goal 3 (Week 1): Pt will orient to date and location with visual aids x85% min A. SLP Short Term Goal 4 (Week 1): Pt will follow 2 step commands in 70% of opportunities with mod A verbal and visual cues.  Skilled Therapeutic Interventions: Pt seen for skilled ST with focus on expressive aphasia and cognitive goals. Pt emotional at beginning of session d/t pain and feeling "overwhelmed", provided gentle education and redirection. With initial cues to scan environment, pt completing orientation task with mod A verbal cues for 75% accuracy. SLP facilitating functional 2-3 step command task with pt performance 90% accuracy with min A cues. Pt continues with word finding deficits in structured and unstructured tasks, concrete category exercise requiring max fading to mod A verbal cues for 60% accuracy. Simple problem solving for room environment with inconsistent performance, at times requiring min cues to ID problems and other times max A. Therapeutic activities impacted by pt being very distracted with pain and cold feeling in R hand, hand does not feel cold to touch. Pt left in wheelchair with blanket over lap and R hand with alarm set and all needs within reach, cont ST POC.   Pain Pain Assessment Pain Scale: 0-10 Pain Score: 5  Pain Location: Ankle Pain Orientation: Right  Therapy/Group: Individual Therapy  Tacey Ruiz 06/04/2021, 11:31 AM

## 2021-06-04 NOTE — Progress Notes (Signed)
PROGRESS NOTE   Subjective/Complaints:  Seen in PT, Right foot pain started at night and interfered with sleep, no injury noted in therapy yesterday   Review of systems unable to obtain secondary to aphasia  Objective:   No results found. No results for input(s): WBC, HGB, HCT, PLT in the last 72 hours.  No results for input(s): NA, K, CL, CO2, GLUCOSE, BUN, CREATININE, CALCIUM in the last 72 hours.   Intake/Output Summary (Last 24 hours) at 06/04/2021 0851 Last data filed at 06/04/2021 0700 Gross per 24 hour  Intake 600 ml  Output --  Net 600 ml         Physical Exam: Vital Signs Blood pressure 102/63, pulse 81, temperature 98.3 F (36.8 C), temperature source Oral, resp. rate 16, height 5\' 3"  (1.6 m), weight 72.5 kg, SpO2 95 %.   General: No acute distress Mood and affect are appropriate Heart: Regular rate and rhythm no rubs murmurs or extra sounds Lungs: Clear to auscultation, breathing unlabored, no rales or wheezes Abdomen: Positive bowel sounds, soft nontender to palpation, nondistended Extremities: No clubbing, cyanosis, or edema Skin: No evidence of breakdown, no evidence of rash Neurologic: Cranial nerves II through XII intact, motor strength is 5/5 in left and 3- right deltoid, bicep, tricep, grip, hip flexor, knee extensors, ankle dorsiflexor and plantar flexor   Musculoskeletal: No pain with ankle range of motion on the right there is pain with knee range of motion.  Knee without erythema, minimal effusion No swelling at ankle hypersensitivity at foot, mild latral ankle tenderness but no pain with passive inversion   Assessment/Plan: 1. Functional deficits which require 3+ hours per day of interdisciplinary therapy in a comprehensive inpatient rehab setting. Physiatrist is providing close team supervision and 24 hour management of active medical problems listed below. Physiatrist and rehab team  continue to assess barriers to discharge/monitor patient progress toward functional and medical goals  Care Tool:  Bathing    Body parts bathed by patient: Chest, Abdomen, Left upper leg, Face, Left arm, Right arm   Body parts bathed by helper: Front perineal area, Buttocks, Right upper leg, Right lower leg, Left lower leg     Bathing assist Assist Level: Minimal Assistance - Patient > 75%     Upper Body Dressing/Undressing Upper body dressing   What is the patient wearing?: Pull over shirt    Upper body assist Assist Level: Minimal Assistance - Patient > 75%    Lower Body Dressing/Undressing Lower body dressing      What is the patient wearing?: Pants     Lower body assist Assist for lower body dressing: 2 Helpers     Toileting Toileting Toileting Activity did not occur (Clothing management and hygiene only): N/A (no void or bm)  Toileting assist Assist for toileting: Maximal Assistance - Patient 25 - 49%     Transfers Chair/bed transfer  Transfers assist     Chair/bed transfer assist level: Dependent - mechanical lift (Stedy)     Locomotion Ambulation   Ambulation assist      Assist level: 2 helpers Assistive device: Hand held assist (L handrail with +2 WC follow) Max distance: 5'  Walk 10 feet activity   Assist  Walk 10 feet activity did not occur: Safety/medical concerns        Walk 50 feet activity   Assist Walk 50 feet with 2 turns activity did not occur: Safety/medical concerns         Walk 150 feet activity   Assist Walk 150 feet activity did not occur: Safety/medical concerns         Walk 10 feet on uneven surface  activity   Assist Walk 10 feet on uneven surfaces activity did not occur: Safety/medical concerns         Wheelchair     Assist Will patient use wheelchair at discharge?: No             Wheelchair 50 feet with 2 turns activity    Assist            Wheelchair 150 feet activity      Assist          Blood pressure 102/63, pulse 81, temperature 98.3 F (36.8 C), temperature source Oral, resp. rate 16, height 5\' 3"  (1.6 m), weight 72.5 kg, SpO2 95 %.   Medical Problem List and Plan: 1.  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  Status post right frontal ventriculostomy for hydrocephalus             -patient may  shower             -ELOS/Goals: 22 to 25 days- cont CIR PT, OT, SLP  2.  Antithrombotics: -DVT/anticoagulation: Subcutaneous heparin initiated 05/18/2021             -antiplatelet therapy: N/A 3. 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 Mood: Provigil 100 mg daily             -antipsychotic agents: N/A 5. Neuropsych: This patient is capable of making decisions on her own behalf. 6. Skin/Wound Care: Routine skin checks 7. Fluids/Electrolytes/Nutrition: Routine in and outs with follow-up chemistries 8.  Seizure prophylaxis.  Keppra completed 9.  Hypertension.  Norvasc 10 mg daily, hydralazine 20 mg every 8 hours, lisinopril 40 mg daily, labetalol 200 mg 3 times daily.  Monitor with increased mobility Vitals:   06/03/21 1938 06/04/21 0543  BP: 118/66 102/63  Pulse: 76 81  Resp: 17 16  Temp: 98.2 F (36.8 C) 98.3 F (36.8 C)  SpO2: 98% 95%  Controlled, 06/03/2021 10.  Hyperglycemia.  Hemoglobin A1c 5.7.  SSI  11.  RIght foot pain appears neurogenic- central pain start gabapentin , poor sensation at risk for ankle inversion injury order aircast   LOS: 5 days A FACE TO FACE EVALUATION WAS PERFORMED  06/05/2021 06/04/2021, 8:51 AM

## 2021-06-04 NOTE — Progress Notes (Signed)
Occupational Therapy Session Note  Patient Details  Name: Marissa Morris MRN: [id number] Date of Birth: 10/13/1961  Today's Date: 06/04/2021 OT Individual Time: 9622-2979 OT Individual Time Calculation (min): 42 min    Short Term Goals: Week 1:  OT Short Term Goal 1 (Week 1): Pt will complete LB dressing at sit<stand level with +2 assistance OT Short Term Goal 2 (Week 1): Pt will complete maintain sitting balance EOB during 1 ADL session with no more than Mod A OT Short Term Goal 3 (Week 1): Pt will complete 1 grooming task while sitting at the sink with Min A using adaptive strategies    Skilled Therapeutic Interventions/Progress Updates:    Pt greeted at time of session sitting up in wheelchair agreeable to OT session, no pain but states she feels "off today" but has c/o this in previous sessions. Decided to focus session on standing, when removing leg rests, L leg rest noted to come apart. Therapist removed from room and provided new L leg rest for pt wheelchair. Upon entering room, dietary present in room getting order from pt for dinner. Once finished, focus of session on standing in stedy, sit <> stand in [place] Mod/Max A and focused on NMR/feedback using mirror perched in stedy while performing BUE movements (punches, raises, shoulder rolls). Stedy > wheelchair agreeable to sit up one more hour before asking to be put back to bed. Alarm on call bell inr each. Note pt with some concern throughout session about state of her room/insignificant things that pt perseverating on.   Therapy Documentation Precautions:  Precautions Precautions: Fall Precaution Comments: Rt hemiparesis with tone in UE/LE Restrictions Weight Bearing Restrictions: No     Therapy/Group: Individual Therapy  Erasmo Score 06/04/2021, 7:22 AM

## 2021-06-04 NOTE — Progress Notes (Signed)
Occupational Therapy Session Note  Patient Details  Name: Marissa Morris MRN: [id number] Date of Birth: July 27, 1961  Today's Date: 06/04/2021 OT Individual Time: 7017-7939 OT Individual Time Calculation (min): 54 min    Short Term Goals: Week 1:  OT Short Term Goal 1 (Week 1): Pt will complete LB dressing at sit<stand level with +2 assistance OT Short Term Goal 2 (Week 1): Pt will complete maintain sitting balance EOB during 1 ADL session with no more than Mod A OT Short Term Goal 3 (Week 1): Pt will complete 1 grooming task while sitting at the sink with Min A using adaptive strategies  Skilled Therapeutic Interventions/Progress Updates:    Pt received semi-reclined in bed finishing breakfast, c/o generalized LLE pain and weakness but agreeable to therapy with encouragement. Pt noted to be emotionally labile this morning, stating that "she's going backwards." With increased time, came to sitting EOB with mod A to progress RLE off bed + to lift trunk. Req consistent CGA to min A to prevet posterior LOB. Total A to don/doff B shoes and TEDS. Stedy transfer of 1 to w/c with min A to power up from elevated surface + mod A to prevent R lean. Pt finished breakfast with intermittent assist to incorporate RUE. Doffed hospital gown with mod A to untie + remove BUE, bathed UB at sink with min A to bathe under RUE. Noted to perseverate on this task and req physical assist to progress to next task. Donned shirt mod A to thread RUE and to pull over head, changed brief and donned pants total A + 2 for STS 2/2 time constraints. Req total A to come up with session tasks to write in SLP memory notebook.  Pt left seated in w/c with safety belt alarm engaged, call bell in reach, and all immediate needs met.    Therapy Documentation Precautions:  Precautions Precautions: Fall Precaution Comments: Rt hemiparesis with tone in UE/LE Restrictions Weight Bearing Restrictions: No  Pain: see session note   ADL: See  Care Tool for more details.  Therapy/Group: Individual Therapy  Volanda Napoleon MS, OTR/L   06/04/2021, 6:52 AM

## 2021-06-05 MED ORDER — GABAPENTIN 100 MG PO CAPS
100.0000 mg | ORAL_CAPSULE | Freq: Three times a day (TID) | ORAL | Status: DC
  Administered 2021-06-05 – 2021-06-08 (×9): 100 mg via ORAL
  Filled 2021-06-05 (×9): qty 1

## 2021-06-05 NOTE — Plan of Care (Signed)
  Problem: Consults Goal: RH STROKE PATIENT EDUCATION Description: See Patient Education module for education specifics  Outcome: Progressing   Problem: RH BOWEL ELIMINATION Goal: RH STG MANAGE BOWEL WITH ASSISTANCE Description: STG Manage Bowel with  mod  I Assistance. Outcome: Progressing Goal: RH STG MANAGE BOWEL W/MEDICATION W/ASSISTANCE Description: STG Manage Bowel with Medication with mod I Assistance. Outcome: Progressing   Problem: RH BLADDER ELIMINATION Goal: RH STG MANAGE BLADDER WITH ASSISTANCE Description: STG Manage Bladder With min assist Assistance Outcome: Progressing   Problem: RH SKIN INTEGRITY Goal: RH STG ABLE TO PERFORM INCISION/WOUND CARE W/ASSISTANCE Description: STG Able To Perform Incision/Wound Care With  min Assistance. Outcome: Progressing   Problem: RH SAFETY Goal: RH STG ADHERE TO SAFETY PRECAUTIONS W/ASSISTANCE/DEVICE Description: STG Adhere to Safety Precautions With cues/r eminders Assistance/Device. Outcome: Progressing   Problem: RH PAIN MANAGEMENT Goal: RH STG PAIN MANAGED AT OR BELOW PT'S PAIN GOAL Description: At or below level 4 Outcome: Progressing   Problem: RH KNOWLEDGE DEFICIT Goal: RH STG INCREASE KNOWLEDGE OF DIABETES Description: Patient and family will be able to manage prediabetes with dietary modifications using handouts and educational tools independently Outcome: Progressing Goal: RH STG INCREASE KNOWLEDGE OF HYPERTENSION Description: Patient and family will be able to manage HTN with medications dietary modifications using handouts and educational tools independently Outcome: Progressing Goal: RH STG INCREASE KNOWLEGDE OF HYPERLIPIDEMIA Description: Patient and family will be able to manage HLD  with medications and  dietary modifications using handouts and educational tools independently Outcome: Progressing Goal: RH STG INCREASE KNOWLEDGE OF STROKE PROPHYLAXIS Description: Patient and family will be able to manage  secondary stroke risks with medications and dietary modifications using handouts and educational tools independently Outcome: Progressing   

## 2021-06-05 NOTE — Progress Notes (Signed)
Occupational Therapy Session Note  Patient Details  Name: Marissa Morris MRN: [id number] Date of Birth: October 20, 1961  Today's Date: 06/05/2021 OT Individual Time: 1457-1540 OT Individual Time Calculation (min): 43 min    Short Term Goals: Week 1:  OT Short Term Goal 1 (Week 1): Pt will complete LB dressing at sit<stand level with +2 assistance OT Short Term Goal 2 (Week 1): Pt will complete maintain sitting balance EOB during 1 ADL session with no more than Mod A OT Short Term Goal 3 (Week 1): Pt will complete 1 grooming task while sitting at the sink with Min A using adaptive strategies   Skilled Therapeutic Interventions/Progress Updates:    Pt received in room and consented to OT tx. Pt seen for [facility] activities and functional reach activities to increase overall function and strength of RUE. Pt instructed in pegs/pegboard task to improve [facility] skills. Pt then instructed in picking up items such as washcloths and horse shoes and picking items up off floor while seated in w/c and cross midline to place on either side of her. Pt instructed to complete all tasks in fwd sitting to engage core for improved trunk control for EOB ADLs. Pt required frequent rest breaks due to fatigue. After tx, pt left up in w/c with all needs met, seatbelt alarm on.   Therapy Documentation Precautions:  Precautions Precautions: Fall Precaution Comments: Rt hemiparesis with tone in UE/LE Restrictions Weight Bearing Restrictions: No   Vital Signs: Therapy Vitals Temp: 98.1 F (36.7 C) Temp Source: Oral Pulse Rate: 73 Resp: 17 BP: 127/72 Patient Position (if appropriate): Sitting Oxygen Therapy SpO2: 100 % O2 Device: Room Air Pain: Pain Assessment Pain Scale: 0-10 Pain Score: 0-No pain   Therapy/Group: Individual Therapy  Marissa Morris 06/05/2021, 4:08 PM

## 2021-06-05 NOTE — Progress Notes (Signed)
PROGRESS NOTE   Subjective/Complaints:  No issues overnite, notes pain and tingling RUE and RLE   Review of systems unable to obtain secondary to aphasia  Objective:   No results found. No results for input(s): WBC, HGB, HCT, PLT in the last 72 hours.  No results for input(s): NA, K, CL, CO2, GLUCOSE, BUN, CREATININE, CALCIUM in the last 72 hours.   Intake/Output Summary (Last 24 hours) at 06/05/2021 0836 Last data filed at 06/04/2021 1800 Gross per 24 hour  Intake 240 ml  Output --  Net 240 ml         Physical Exam: Vital Signs Blood pressure 127/60, pulse 78, temperature 97.7 F (36.5 C), temperature source Oral, resp. rate 16, height 5\' 3"  (1.6 m), weight 72.5 kg, SpO2 97 %.  General: No acute distress Mood and affect are appropriate Heart: Regular rate and rhythm no rubs murmurs or extra sounds Lungs: Clear to auscultation, breathing unlabored, no rales or wheezes Abdomen: Positive bowel sounds, soft nontender to palpation, nondistended Extremities: No clubbing, cyanosis, or edema Skin: No evidence of breakdown, no evidence of rash Neurologic: Cranial nerves II through XII intact, motor strength is 5/5 in left and 3- right deltoid, bicep, tricep, grip, hip flexor, knee extensors, ankle dorsiflexor and plantar flexor Sensation absent LT RUE and RLE  Musculoskeletal: No pain with ankle range of motion on the right there is pain with knee range of motion.  Knee without erythema, minimal effusion No swelling at ankle hypersensitivity at foot, mild latral ankle tenderness but no pain with passive inversion   Assessment/Plan: 1. Functional deficits which require 3+ hours per day of interdisciplinary therapy in a comprehensive inpatient rehab setting. Physiatrist is providing close team supervision and 24 hour management of active medical problems listed below. Physiatrist and rehab team continue to assess barriers  to discharge/monitor patient progress toward functional and medical goals  Care Tool:  Bathing    Body parts bathed by patient: Chest, Abdomen, Left upper leg, Face, Left arm, Right arm   Body parts bathed by helper: Front perineal area, Buttocks, Right upper leg, Right lower leg, Left lower leg     Bathing assist Assist Level: Minimal Assistance - Patient > 75%     Upper Body Dressing/Undressing Upper body dressing   What is the patient wearing?: Pull over shirt    Upper body assist Assist Level: Moderate Assistance - Patient 50 - 74%    Lower Body Dressing/Undressing Lower body dressing      What is the patient wearing?: Pants, Underwear/pull up     Lower body assist Assist for lower body dressing: 2 Helpers     Toileting Toileting Toileting Activity did not occur (Clothing management and hygiene only): N/A (no void or bm)  Toileting assist Assist for toileting: Maximal Assistance - Patient 25 - 49%     Transfers Chair/bed transfer  Transfers assist     Chair/bed transfer assist level: 2 Helpers     Locomotion Ambulation   Ambulation assist      Assist level: 2 helpers Assistive device: Other (comment) (wall rail) Max distance: 7'   Walk 10 feet activity   Assist  Walk 10  feet activity did not occur: Safety/medical concerns        Walk 50 feet activity   Assist Walk 50 feet with 2 turns activity did not occur: Safety/medical concerns         Walk 150 feet activity   Assist Walk 150 feet activity did not occur: Safety/medical concerns         Walk 10 feet on uneven surface  activity   Assist Walk 10 feet on uneven surfaces activity did not occur: Safety/medical concerns         Wheelchair     Assist Will patient use wheelchair at discharge?: No Type of Wheelchair: Manual    Wheelchair assist level: Minimal Assistance - Patient > 75% Max wheelchair distance: 79'    Wheelchair 50 feet with 2 turns  activity    Assist        Assist Level: Minimal Assistance - Patient > 75%   Wheelchair 150 feet activity     Assist          Blood pressure 127/60, pulse 78, temperature 97.7 F (36.5 C), temperature source Oral, resp. rate 16, height 5\' 3"  (1.6 m), weight 72.5 kg, SpO2 97 %.   Medical Problem List and Plan: 1.  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  Status post right frontal ventriculostomy for hydrocephalus             -patient may  shower             -ELOS/Goals: 22 to 25 days- cont CIR PT, OT, SLP  2.  Antithrombotics: -DVT/anticoagulation: Subcutaneous heparin initiated 05/18/2021             -antiplatelet therapy: N/A 3. 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 Mood: Provigil 100 mg daily             -antipsychotic agents: N/A 5. Neuropsych: This patient is capable of making decisions on her own behalf. 6. Skin/Wound Care: Routine skin checks 7. Fluids/Electrolytes/Nutrition: Routine in and outs with follow-up chemistries 8.  Seizure prophylaxis.  Keppra completed 9.  Hypertension.  Norvasc 10 mg daily, hydralazine 20 mg every 8 hours, lisinopril 40 mg daily, labetalol 200 mg 3 times daily.  Monitor with increased mobility Vitals:   06/05/21 0804 06/05/21 0807  BP: 127/60 127/60  Pulse: 78 78  Resp:    Temp:    SpO2:    Controlled, 06/05/2021 10.  Hyperglycemia.  Hemoglobin A1c 5.7.  SSI  11.  RIght foot pain appears neurogenic- central pain start gabapentin TID today  , poor sensation at risk for ankle inversion injury order aircast   LOS: 6 days A FACE TO FACE EVALUATION WAS PERFORMED  06/07/2021 06/05/2021, 8:36 AM

## 2021-06-05 NOTE — Progress Notes (Signed)
Occupational Therapy Session Note  Patient Details  Name: Marissa Morris MRN: [id number] Date of Birth: 06-Jul-1961  Today's Date: 06/05/2021 OT Individual Time: 0093-8182 OT Individual Time Calculation (min): 60 min + 46 min   Short Term Goals: Week 1:  OT Short Term Goal 1 (Week 1): Pt will complete LB dressing at sit<stand level with +2 assistance OT Short Term Goal 2 (Week 1): Pt will complete maintain sitting balance EOB during 1 ADL session with no more than Mod A OT Short Term Goal 3 (Week 1): Pt will complete 1 grooming task while sitting at the sink with Min A using adaptive strategies  Skilled Therapeutic Interventions/Progress Updates:    Session 1 ([phone]): Pt received side-lying in bed, c/o L knee pain but agreeable to therapy and very eager to get ready for the day. RN reports pt being premedicated prior to session. Pt moderately emotionally labile throughout session, internally distracted and perseverating on her late start to the morning and L knee pain. Stating conflicting statements that she can't move at all today, despite coming to EOB with mod A to lift trunk + progress BLE off bed and power up in stedy with min A throughout session. Total A to don Teds, B shoes, B socks, and pants with use of stedy 2/2 pt's self-limiting behaviors. Pt reports relief from pain once L ted and shoe are donned. Bathed UBB seated in w/c at sink with min A  to bathe under RUE. Donned shirt with min A to thread RUE.   Pt left seated in w/c with safety belt alarm engaged, call bell in reach, and all immediate needs met.    Session 2 [phone]): Pt received seated in w/c with boyfriend Coralyn Mark present, cont to c/o R hand pain and perseverating on nursing not giving her the right dosage, otherwise pt agreeable to therapy. Session focus on squat-pivot transfers and STS in prep for improved functional mobility. Total A w/c transport to and from gym. Pt completed massed practice of partial STS with focus  on forward trunk flexion to unload weight from pelvis. Pt req max demonstrational and TC for technique, but demonstrated ability to complete squat-pivot with overall min to mod A at end of session. Practiced squat-pivot to both L and R down mat table. + 2 to guide pt's hips and for safety. Finally, completed several STS with min A + 2 to power up and mod A to prevent R lean and to block R knee in standing.    Pt left seated in w/c with boyfriend present with safety belt alarm engaged, call bell in reach, and all immediate needs met.    Therapy Documentation Precautions:  Precautions Precautions: Fall Precaution Comments: Rt hemiparesis with tone in UE/LE Restrictions Weight Bearing Restrictions: No  Pain:  See session notes ADL: See Care Tool for more details.  Therapy/Group: Individual Therapy  Volanda Napoleon MS, OTR/L   06/05/2021, 6:44 AM

## 2021-06-05 NOTE — Progress Notes (Signed)
Speech Language Pathology Daily Session Note  Patient Details  Name: Iniya Matzek MRN: [id number] Date of Birth: 1961-04-29  Today's Date: 06/05/2021 SLP Individual Time: 1401-1430 SLP Individual Time Calculation (min): 29 min  Short Term Goals: Week 1: SLP Short Term Goal 1 (Week 1): Pt will increase word finding at sentence level to min A with verbal cues. SLP Short Term Goal 2 (Week 1): Pt will increase written comprehension at sentence level to min A visual and verbal cues. SLP Short Term Goal 3 (Week 1): Pt will orient to date and location with visual aids x85% min A. SLP Short Term Goal 4 (Week 1): Pt will follow 2 step commands in 70% of opportunities with mod A verbal and visual cues.  Skilled Therapeutic Interventions: Pt seen for skilled ST with focus on cognitive communication goals, pt boyfriend present during initial portion of tx session. SLP facilitating orientation task by providing initial cue to scan environment, pt +DOW, +MOY, +date, +year, +location. Pt with decreased internal/external distractions this date, able to complete simple problem solving task for room environment provided mod fading to min A cues to recall current physical and cognitive deficits and their implications on safety. Pt able to recall location of memory book, SLP completing. Pt able to communicate desire to watch news channel and recalling primary news story of the day independently. Pt left in wheelchair with alarm belt on and all needs within reach. Cont ST POC.   Pain Pain Assessment Pain Scale: 0-10 Pain Score: 0-No pain  Therapy/Group: Individual Therapy  Tacey Ruiz 06/05/2021, 2:24 PM

## 2021-06-05 NOTE — Progress Notes (Signed)
Physical Therapy Session Note  Patient Details  Name: Marissa Morris MRN: [id number] Date of Birth: 07-Mar-1961  Today's Date: 06/05/2021 PT Individual Time: 1300-1340 PT Individual Time Calculation (min): 40 min   Short Term Goals: Week 1:  PT Short Term Goal 1 (Week 1): Pt will perform bed mobility with modA. PT Short Term Goal 2 (Week 1): Pt will perform bed to chair transfer with modA. PT Short Term Goal 3 (Week 1): Pt will perform sit to stand with modA. PT Short Term Goal 4 (Week 1): Pt will ambulate x25' with maxA and LRAD.   Skilled Therapeutic Interventions/Progress Updates:  Patient seated in w/c on entrance to room. Husband present. Patient alert and agreeable to PT session. Patient related moderate level pain in R upper leg during session. RN notified and application of Voltarin gel to ant and post thigh for pain relief. +2 available for safe guarding of balance/ posture.  Therapeutic Activity: Transfers: Patient performed STS transfers using STEDY with Mod A. Provided verbal cues for forward lean, increasing effort, and upright posture. Performed several times for blocked practice and improving technique for full rise to stand.  Neuromuscular Re-ed: NMR facilitated during session with focus on standing balance, pregait training. During standing bouts in STEDY, pt guided in orienting to midline, full body extension into upright posture, facilitating motor control of R knee into extension, lateral weight shifting with attempt to progress to L heel raise with shift to RLE. During seated rest breaks, pt guided in bil scap sets, LUE reaching and AROM, trunk rotation and flexion bilaterally. NMR performed for improvements in motor control and coordination, balance, sequencing, judgement, and self confidence/ efficacy in performing all aspects of mobility at highest level of independence.   Patient seated  in w/c  at end of session with brakes locked, belt alarm set, and all needs within  reach. Husband present.      Therapy Documentation Precautions:  Precautions Precautions: Fall Precaution Comments: Rt hemiparesis with tone in UE/LE Restrictions Weight Bearing Restrictions: No General:   Vital Signs: Therapy Vitals Pulse Rate: 73 BP: 127/72  Therapy/Group: Individual Therapy  Loel Dubonnet PT, DPT 06/05/2021, 5:41 PM

## 2021-06-06 NOTE — Progress Notes (Signed)
Orthopedic Tech Progress Note Patient Details:  Jameela Bartmess Feb 16, 1961 [id number]  Ortho Devices Type of Ortho Device: Ankle Air splint Ortho Device/Splint Location: RLE Ortho Device/Splint Interventions: Application, Ordered, Adjustment   Post Interventions Patient Tolerated: Well Instructions Provided: Adjustment of device  Vinayak Bobier A Erinn Mendosa 06/06/2021, 8:42 AM

## 2021-06-06 NOTE — Progress Notes (Signed)
Physical Therapy Session Note  Patient Details  Name: Marissa Morris MRN: [id number] Date of Birth: June 26, 1961  Today's Date: 06/06/2021 PT Individual Time:  -      Short Term Goals: Week 1:  PT Short Term Goal 1 (Week 1): Pt will perform bed mobility with modA. PT Short Term Goal 2 (Week 1): Pt will perform bed to chair transfer with modA. PT Short Term Goal 3 (Week 1): Pt will perform sit to stand with modA. PT Short Term Goal 4 (Week 1): Pt will ambulate x25' with maxA and LRAD.   Skilled Therapeutic Interventions/Progress Updates:    Pt supine in bed with HOB elevated. Pt states she likes to have OT help her to "freshen up" and get dressed before PT. Pt educated on schedule for the day and that therapist will help with dressing. Supine>sit with moderate assistance for shoulders/trunk and terminal transition of RLE off the bed. Pt able to weight shift right and left with verbal cues. Max A to help donn pants and Total A with donning shoes bilaterally.  Squat pivot transfer into wc with SB towards the left side. Pt required moderate assistance weight shifting forward and left lateral movement. Multimodal cuing for sequencing and use of RUE/RLE.   Sit <> stand transitions throughout session at [facility] assistance with therapist blocking RLE, LUE hand support on railing, verbal/tactile cuing for sequencing and manual assistance with terminal transition to standing. Pt requires manual assistance to safely lower into the chair throughout session. Verbal cues for a slow and controlled lowering into chair and to reach back with the LUE.  Pt demonstrated fear of reaching back with LUE by maintaining grip on railing during stand>sit transitions.   Standing balance, pregait, and gait training performed in the hall in front of 4W gym.   Pregait LLE 2x4 forward and back to facilitate confidence in weight shifting onto RLE and increase LLE step length prior to ambulation. 2x4 forwards and back RLE.  Ambulation 3 x 38ft with mirror for visual aid.  Throughout gait training therapist blocking RLE and LUE support on railing at [facility] assistance with manual facilitation/balance. Max multimodal cuing for posture, weight shifting, sequencing, step length BLE, and activation of glutes/RLE quads.  Pt sitting in chair with seatbelt alarm on at the end of session. Call bell within reach.  Therapy Documentation Precautions:  Precautions Precautions: Fall Precaution Comments: Rt hemiparesis with tone in UE/LE Restrictions Weight Bearing Restrictions: No  Pain: Pain Assessment Pain Scale: 0-10 Pain Score: 0-No pain Faces Pain Scale: No hurt   Therapy/Group: Individual Therapy  Gennett Garcia, SPT 06/06/2021, 10:22 AM

## 2021-06-06 NOTE — Progress Notes (Signed)
Speech Language Pathology Daily Session Note  Patient Details  Name: Marissa Morris MRN: [id number] Date of Birth: 04-29-1961  Today's Date: 06/06/2021 SLP Individual Time: 1505-1530 SLP Individual Time Calculation (min): 25 min  Short Term Goals: Week 1: SLP Short Term Goal 1 (Week 1): Pt will increase word finding at sentence level to min A with verbal cues. SLP Short Term Goal 2 (Week 1): Pt will increase written comprehension at sentence level to min A visual and verbal cues. SLP Short Term Goal 3 (Week 1): Pt will orient to date and location with visual aids x85% min A. SLP Short Term Goal 4 (Week 1): Pt will follow 2 step commands in 70% of opportunities with mod A verbal and visual cues.  Skilled Therapeutic Interventions: Pt seen for skilled ST with focus on language skills. Pt c/o feeling very fatigued, agreeable to skilled ST. Pt provided picture card with 4-items, able to ID item that does fit with others in the category with min fading to supervision level A. When asked to explain why item doesn't fit others, pt requiring consistent mod A cues to identify and attempt to correct semantic errors. Pt encouraged to reduce response to 3-4 words, effective in increasing communicative message. Pt left in wheelchair with alarm set and all needs within reach. Cont ST POC.   Pain Pain Assessment Pain Scale: 0-10 Pain Score: 0-No pain Pain Type: Acute pain Pain Location: Knee Pain Orientation: Right Pain Descriptors / Indicators: Aching Pain Onset: On-going Pain Intervention(s): Medication (See eMAR)  Therapy/Group: Individual Therapy  Tacey Ruiz 06/06/2021, 3:21 PM

## 2021-06-06 NOTE — Progress Notes (Signed)
Occupational Therapy Weekly Progress Note  Patient Details  Name: Marissa Morris MRN: [id number] Date of Birth: November 22, 1961  Beginning of progress report period: May 31, 2021 End of progress report period: June 06, 2021  Patient has met 3 of 3 short term goals.  Pt has made steady progress this reporting period with OT. She has demonstrated improved static sitting and standing balance, functional use of RUE, increased activity tolerance, and BUE/BLE strength to perform UBD/UBB at min A, LBD with max A + 2. Has demonstrated decreased hypertonia in R elbow, RUE remains at [place] level IV, hand at level IV to V. She cont to primarily be limited by self-limiting and internally distracting behaviors and emotional lability.    Patient continues to demonstrate the following deficits: muscle weakness, decreased cardiorespiratoy endurance, impaired timing and sequencing, abnormal tone, unbalanced muscle activation, ataxia, decreased coordination, and decreased motor planning, decreased midline orientation, decreased attention to right, and decreased motor planning, decreased attention, decreased awareness, decreased problem solving, decreased safety awareness, decreased memory, and delayed processing, and decreased sitting balance, decreased standing balance, decreased postural control, hemiplegia, and decreased balance strategies and therefore will continue to benefit from skilled OT intervention to enhance overall performance with BADL and Reduce care partner burden.  Patient progressing toward long term goals..  Continue plan of care.  OT Short Term Goals Week 2:  OT Short Term Goal 1 (Week 2): Pt will complete toilet transfer with mod A + LRAD. OT Short Term Goal 2 (Week 2): Pt will don pants at STS level with max A of 1. OT Short Term Goal 3 (Week 2): Pt will tolerate standing with mod A for >51mn in prep for standing ADL.  RVolanda NapoleonMS, OTR/L  06/06/2021, 1:05 PM

## 2021-06-06 NOTE — Progress Notes (Signed)
Speech Language Pathology Daily Session Note  Patient Details  Name: Marissa Morris MRN: [id number] Date of Birth: June 11, 1961  Today's Date: 06/06/2021 SLP Individual Time: 1350-1433 SLP Individual Time Calculation (min): 43 min  Short Term Goals: Week 1: SLP Short Term Goal 1 (Week 1): Pt will increase word finding at sentence level to min A with verbal cues. SLP Short Term Goal 2 (Week 1): Pt will increase written comprehension at sentence level to min A visual and verbal cues. SLP Short Term Goal 3 (Week 1): Pt will orient to date and location with visual aids x85% min A. SLP Short Term Goal 4 (Week 1): Pt will follow 2 step commands in 70% of opportunities with mod A verbal and visual cues.  Skilled Therapeutic Interventions:Skilled ST services focused on language skills. SLP facilitated orientation to place and time, pt required mod A verbal cues to accurately utilize visual aids in room and only demonstrated carryover after SLP wrote information in one location (inside notebook.) Pt then demonstrates orientation to time and place referencing notebook with supervision A verbal cues in 5 minute intervals and then in 10 minute intervals required min A verbal cues to accurately reference written aid. Pt demonstrated error awareness and word finding skills in basic and mildly complex picture description tasks. Pt was able to describe at sentence level 1 out 4 mildly complex pictures and 9 out 14 basic verb pictures with little error awareness of semantic errors. Pt was able to name 5 out 10 basic categories mod I and 10 out 10 with semantic and sentence completion cues in convergent task and list 5 members in simple categories in 2 out 5 opportunities. Pt was left in room with call bell within reach and bed/chair alarm set. SLP recommends to continue skilled services.     Pain Pain Assessment Pain Scale: 0-10 Pain Score: 3  Pain Type: Acute pain Pain Location: Back Pain Orientation: Right Pain  Intervention(s): Heat applied  Therapy/Group: Individual Therapy  Brityn Mastrogiovanni  [facility] 06/06/2021, 7:58 AM

## 2021-06-06 NOTE — Progress Notes (Signed)
PROGRESS NOTE   Subjective/Complaints: No complaints. Asks if the voltaren gel frequency was increased- discussed it is 4 times per day  Review of systems unable to obtain secondary to aphasia  Objective:   No results found. No results for input(s): WBC, HGB, HCT, PLT in the last 72 hours.  No results for input(s): NA, K, CL, CO2, GLUCOSE, BUN, CREATININE, CALCIUM in the last 72 hours.   Intake/Output Summary (Last 24 hours) at 06/06/2021 1357 Last data filed at 06/06/2021 1328 Gross per 24 hour  Intake 474 ml  Output --  Net 474 ml        Physical Exam: Vital Signs Blood pressure 114/70, pulse 73, temperature 98.4 F (36.9 C), temperature source Oral, resp. rate 18, height 5\' 3"  (1.6 m), weight 72.5 kg, SpO2 100 %. Gen: no distress, normal appearing HEENT: oral mucosa pink and moist, NCAT Cardio: Reg rate Chest: normal effort, normal rate of breathing Abd: soft, non-distended  Extremities: No clubbing, cyanosis, or edema Skin: No evidence of breakdown, no evidence of rash Neurologic: Cranial nerves II through XII intact, motor strength is 5/5 in left and 3- right deltoid, bicep, tricep, grip, hip flexor, knee extensors, ankle dorsiflexor and plantar flexor Sensation absent LT RUE and RLE  Musculoskeletal: No pain with ankle range of motion on the right there is pain with knee range of motion.  Knee without erythema, minimal effusion No swelling at ankle hypersensitivity at foot, mild latral ankle tenderness but no pain with passive inversion   Assessment/Plan: 1. Functional deficits which require 3+ hours per day of interdisciplinary therapy in a comprehensive inpatient rehab setting. Physiatrist is providing close team supervision and 24 hour management of active medical problems listed below. Physiatrist and rehab team continue to assess barriers to discharge/monitor patient progress toward functional and medical  goals  Care Tool:  Bathing    Body parts bathed by patient: Chest, Abdomen, Left upper leg, Face, Left arm, Right arm   Body parts bathed by helper: Front perineal area, Buttocks, Right upper leg, Right lower leg, Left lower leg     Bathing assist Assist Level: Minimal Assistance - Patient > 75%     Upper Body Dressing/Undressing Upper body dressing   What is the patient wearing?: Pull over shirt    Upper body assist Assist Level: Moderate Assistance - Patient 50 - 74%    Lower Body Dressing/Undressing Lower body dressing      What is the patient wearing?: Pants, Underwear/pull up     Lower body assist Assist for lower body dressing: Dependent - Patient 0% (stedy)     Toileting Toileting Toileting Activity did not occur (Clothing management and hygiene only): N/A (no void or bm)  Toileting assist Assist for toileting: Maximal Assistance - Patient 25 - 49%     Transfers Chair/bed transfer  Transfers assist     Chair/bed transfer assist level: 2 Helpers     Locomotion Ambulation   Ambulation assist      Assist level: 2 helpers Assistive device: Other (comment) (wall rail) Max distance: 7'   Walk 10 feet activity   Assist  Walk 10 feet activity did not occur: Safety/medical concerns  Walk 50 feet activity   Assist Walk 50 feet with 2 turns activity did not occur: Safety/medical concerns         Walk 150 feet activity   Assist Walk 150 feet activity did not occur: Safety/medical concerns         Walk 10 feet on uneven surface  activity   Assist Walk 10 feet on uneven surfaces activity did not occur: Safety/medical concerns         Wheelchair     Assist Will patient use wheelchair at discharge?: No Type of Wheelchair: Manual    Wheelchair assist level: Minimal Assistance - Patient > 75% Max wheelchair distance: 26'    Wheelchair 50 feet with 2 turns activity    Assist        Assist Level: Minimal  Assistance - Patient > 75%   Wheelchair 150 feet activity     Assist          Blood pressure 114/70, pulse 73, temperature 98.4 F (36.9 C), temperature source Oral, resp. rate 18, height 5\' 3"  (1.6 m), weight 72.5 kg, SpO2 100 %.   Medical Problem List and Plan: 1.  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  Status post right frontal ventriculostomy for hydrocephalus             -patient may  shower             -ELOS/Goals: 22 to 25 days- cont CIR PT, OT, SLP   -Continue  2.  Impaired mobility -DVT/anticoagulation: Continue Subcutaneous heparin initiated 05/18/2021             -antiplatelet therapy: N/A 3. 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 Mood: Provigil 100 mg daily             -antipsychotic agents: N/A 5. Neuropsych: This patient is capable of making decisions on her own behalf. 6. Skin/Wound Care: Routine skin checks 7. Fluids/Electrolytes/Nutrition: Routine in and outs with follow-up chemistries 8.  Seizure prophylaxis.  Keppra completed 9.  Hypertension.  Continue Norvasc 10 mg daily, hydralazine 20 mg every 8 hours, lisinopril 40 mg daily, labetalol 200 mg 3 times daily.  Monitor with increased mobility Vitals:   06/06/21 0731 06/06/21 1327  BP: 128/75 114/70  Pulse: 83 73  Resp: 16 18  Temp: 98.2 F (36.8 C) 98.4 F (36.9 C)  SpO2: 100% 100%  Controlled, 06/06/2021 10.  Hyperglycemia.  Hemoglobin A1c 5.7.  SSI  11.  RIght foot pain appears neurogenic- central pain start gabapentin TID today  , poor sensation at risk for ankle inversion injury order aircast   LOS: 7 days A FACE TO FACE EVALUATION WAS PERFORMED  06/08/2021 P Shayonna Ocampo 06/06/2021, 1:57 PM

## 2021-06-07 MED ORDER — ALUM & MAG HYDROXIDE-SIMETH 200-200-20 MG/5ML PO SUSP
30.0000 mL | ORAL | Status: DC | PRN
  Administered 2021-06-07: 30 mL via ORAL
  Filled 2021-06-07: qty 30

## 2021-06-07 NOTE — Progress Notes (Signed)
Speech Language Pathology Daily Session Note  Patient Details  Name: Marissa Morris MRN: [id number] Date of Birth: 1961-01-05  Today's Date: 06/07/2021 SLP Individual Time: 1430-1500 SLP Individual Time Calculation (min): 30 min  Short Term Goals: Week 1: SLP Short Term Goal 1 (Week 1): Pt will increase word finding at sentence level to min A with verbal cues. SLP Short Term Goal 2 (Week 1): Pt will increase written comprehension at sentence level to min A visual and verbal cues. SLP Short Term Goal 3 (Week 1): Pt will orient to date and location with visual aids x85% min A. SLP Short Term Goal 4 (Week 1): Pt will follow 2 step commands in 70% of opportunities with mod A verbal and visual cues.  Skilled Therapeutic Interventions:  Pt was seen for skilled ST targeting communication goals.  Pt was seated upright in wheelchair upon therapist's arrival, awake, alert, and agreeable to participating in treatment.  SLP facilitated the session with a categorical naming task to address word finding goals.  Pt was able to complete task with min semantic cues.  Pt also independently requested that therapist record today's activity into her memory notebook before leaving and was able to use notebook to recall activities from previous therapy sessions.  Pt was left in chair with chair alarm set and call bell within reach.  Continue per current plan of care.    Pain Pain Assessment Pain Scale: 0-10 Pain Score: 0-No pain  Therapy/Group: Individual Therapy  Yides Saidi, Melanee Spry 06/07/2021, 3:42 PM

## 2021-06-08 MED ORDER — GABAPENTIN 100 MG PO CAPS
200.0000 mg | ORAL_CAPSULE | Freq: Three times a day (TID) | ORAL | Status: DC
  Administered 2021-06-08 – 2021-06-11 (×9): 200 mg via ORAL
  Filled 2021-06-08 (×9): qty 2

## 2021-06-08 NOTE — Progress Notes (Signed)
Nutrition Follow-up  DOCUMENTATION CODES:   Not applicable  INTERVENTION:  Continue Ensure Enlive po BID, each supplement provides 350 kcal and 20 grams of protein  Encourage adequate PO intake.   NUTRITION DIAGNOSIS:   Increased nutrient needs related to  (therapy) as evidenced by estimated needs; ongoing  GOAL:   Patient will meet greater than or equal to 90% of their needs' progressing  MONITOR:   PO intake, Supplement acceptance, Skin, Weight trends, Labs, I & O's  REASON FOR ASSESSMENT:   Malnutrition Screening Tool    ASSESSMENT:   60 year old female with history of hypertension presenting with right side weakness nausea vomiting facial droop with dysarthria. 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 Pt underwent right frontal ventriculostomy for hydrocephalus. Due to patient's right side weakness and dysarthria admitted to CIR.  Meal completion has been varied from 20-90%. Pt reports appetite has been varied due to taste changes. Pt however has been trying to eat her food at meals and to drink her supplements. Pt currently has Ensure ordered and has been consuming them. RD to continue with current orders to aid in caloric and protein needs.   NUTRITION - FOCUSED PHYSICAL EXAM:  Flowsheet Row Most Recent Value  Orbital Region No depletion  Upper Arm Region No depletion  Thoracic and Lumbar Region No depletion  Buccal Region No depletion  Temple Region No depletion  Clavicle Bone Region No depletion  Clavicle and Acromion Bone Region No depletion  Scapular Bone Region Unable to assess  Dorsal Hand No depletion  Patellar Region No depletion  Anterior Thigh Region No depletion  Posterior Calf Region No depletion  Edema (RD Assessment) None  Hair Reviewed  Eyes Reviewed  Mouth Reviewed  Skin Reviewed  Nails Reviewed      Labs and medications reviewed.   Diet Order:   Diet Order             Diet regular Room  service appropriate? Yes with Assist; Fluid consistency: Thin  Diet effective now                   EDUCATION NEEDS:   Not appropriate for education at this time  Skin:  Skin Assessment: Reviewed RN Assessment  Last BM:  6/27  Height:   Ht Readings from Last 1 Encounters:  05/30/21 5\' 3"  (1.6 m)    Weight:   Wt Readings from Last 1 Encounters:  06/06/21 73.2 kg   BMI:  Body mass index is 28.59 kg/m.  Estimated Nutritional Needs:   Kcal:  1800-2000  Protein:  90-100 grams  Fluid:  >/= 1.8 L/day  06/08/21, MS, RD, LDN RD pager number/after hours weekend pager number on Amion.

## 2021-06-08 NOTE — Progress Notes (Signed)
PROGRESS NOTE   Subjective/Complaints:  Pain in Right arm and RIght leg, discussed thalamic pain syndrome and the difficulty with treatment   Review of systems unable to obtain secondary to aphasia  Objective:   No results found. No results for input(s): WBC, HGB, HCT, PLT in the last 72 hours.  No results for input(s): NA, K, CL, CO2, GLUCOSE, BUN, CREATININE, CALCIUM in the last 72 hours.   Intake/Output Summary (Last 24 hours) at 06/08/2021 1100 Last data filed at 06/08/2021 0849 Gross per 24 hour  Intake 649 ml  Output --  Net 649 ml         Physical Exam: Vital Signs Blood pressure (!) 144/73, pulse 80, temperature 99.1 F (37.3 C), temperature source Oral, resp. rate 18, height 5\' 3"  (1.6 m), weight 73.2 kg, SpO2 98 %.  General: No acute distress Mood and affect are appropriate Heart: Regular rate and rhythm no rubs murmurs or extra sounds Lungs: Clear to auscultation, breathing unlabored, no rales or wheezes Abdomen: Positive bowel sounds, soft nontender to palpation, nondistended Extremities: No clubbing, cyanosis, or edema Skin: No evidence of breakdown, no evidence of rash  Neurologic: Cranial nerves II through XII intact, motor strength is 5/5 in left and 3- right deltoid, bicep, tricep, grip, hip flexor, knee extensors, ankle dorsiflexor and plantar flexor Sensation absent LT RUE and RLE  Musculoskeletal: No pain with ankle range of motion on the right there is pain with knee range of motion.  Knee without erythema, minimal effusion No swelling at ankle hypersensitivity at foot, mild latral ankle tenderness but no pain with passive inversion   Assessment/Plan: 1. Functional deficits which require 3+ hours per day of interdisciplinary therapy in a comprehensive inpatient rehab setting. Physiatrist is providing close team supervision and 24 hour management of active medical problems listed  below. Physiatrist and rehab team continue to assess barriers to discharge/monitor patient progress toward functional and medical goals  Care Tool:  Bathing    Body parts bathed by patient: Chest, Abdomen, Left upper leg, Face, Left arm, Right arm   Body parts bathed by helper: Front perineal area, Buttocks, Right upper leg, Right lower leg, Left lower leg     Bathing assist Assist Level: Minimal Assistance - Patient > 75%     Upper Body Dressing/Undressing Upper body dressing   What is the patient wearing?: Pull over shirt    Upper body assist Assist Level: Moderate Assistance - Patient 50 - 74%    Lower Body Dressing/Undressing Lower body dressing      What is the patient wearing?: Pants, Underwear/pull up     Lower body assist Assist for lower body dressing: Dependent - Patient 0% (stedy)     Toileting Toileting Toileting Activity did not occur (Clothing management and hygiene only): N/A (no void or bm)  Toileting assist Assist for toileting: Maximal Assistance - Patient 25 - 49%     Transfers Chair/bed transfer  Transfers assist     Chair/bed transfer assist level: 2 Helpers     Locomotion Ambulation   Ambulation assist      Assist level: 2 helpers Assistive device: Other (comment) (wall rail) Max distance: 7'  Walk 10 feet activity   Assist  Walk 10 feet activity did not occur: Safety/medical concerns        Walk 50 feet activity   Assist Walk 50 feet with 2 turns activity did not occur: Safety/medical concerns         Walk 150 feet activity   Assist Walk 150 feet activity did not occur: Safety/medical concerns         Walk 10 feet on uneven surface  activity   Assist Walk 10 feet on uneven surfaces activity did not occur: Safety/medical concerns         Wheelchair     Assist Will patient use wheelchair at discharge?: No Type of Wheelchair: Manual    Wheelchair assist level: Minimal Assistance - Patient >  75% Max wheelchair distance: 12'    Wheelchair 50 feet with 2 turns activity    Assist        Assist Level: Minimal Assistance - Patient > 75%   Wheelchair 150 feet activity     Assist          Blood pressure (!) 144/73, pulse 80, temperature 99.1 F (37.3 C), temperature source Oral, resp. rate 18, height 5\' 3"  (1.6 m), weight 73.2 kg, SpO2 98 %.   Medical Problem List and Plan: 1.  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  Status post right frontal ventriculostomy for hydrocephalus             -patient may  shower             -ELOS/Goals: 22 to 25 days- cont CIR PT, OT, SLP   -Continue  2.  Impaired mobility -DVT/anticoagulation: Continue Subcutaneous heparin initiated 05/18/2021             -antiplatelet therapy: N/A 3. 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 Mood: Provigil 100 mg daily             -antipsychotic agents: N/A 5. Neuropsych: This patient is capable of making decisions on her own behalf. 6. Skin/Wound Care: Routine skin checks 7. Fluids/Electrolytes/Nutrition: Routine in and outs with follow-up chemistries 8.  Seizure prophylaxis.  Keppra completed 9.  Hypertension.  Continue Norvasc 10 mg daily, hydralazine 20 mg every 8 hours, lisinopril 40 mg daily, labetalol 200 mg 3 times daily.  Monitor with increased mobility Vitals:   06/07/21 2000 06/08/21 0536  BP: 119/72 (!) 144/73  Pulse: 79 80  Resp: 18 18  Temp:  99.1 F (37.3 C)  SpO2: 100% 98%  Controlled, 06/08/2021 10.  Hyperglycemia.  Hemoglobin A1c 5.7.  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

## 2021-06-08 NOTE — Progress Notes (Signed)
Physical Therapy Weekly Progress Note  Patient Details  Name: Marissa Morris MRN: [id number] Date of Birth: 1961/10/10  Beginning of progress report period: May 31, 2021 End of progress report period: June 08, 2021  Today's Date: 06/08/2021 PT Individual Time: 3532-9924 and 1300-1330 PT Individual Time Calculation (min): 73 min and 30 min  Patient has met 1 of 4 short term goals.  Pt is making slow progress since start of care d/t increased pain in R knee, especially with WB activity. Knee pain is chronic and existed PTA. With decreased muscle strength and control on R side, knee stability is in question. Pain has been barrier to progress in transfers and gait as strength has been slowly returning. Increases have been seen in R sided strength, motor control, proprioception, overall activity tolerance and pt continues to participate daily.  Patient continues to demonstrate the following deficits muscle weakness, decreased cardiorespiratoy endurance, impaired timing and sequencing, unbalanced muscle activation, decreased coordination, and decreased motor planning, decreased midline orientation and decreased attention to right, decreased safety awareness and decreased memory, and decreased standing balance, hemiplegia, and decreased balance strategies and therefore will continue to benefit from skilled PT intervention to increase functional independence with mobility.  Patient progressing toward long term goals..  Continue plan of care.  PT Short Term Goals Week 1:  PT Short Term Goal 1 (Week 1): Pt will perform bed mobility with modA. PT Short Term Goal 1 - Progress (Week 1): Progressing toward goal PT Short Term Goal 2 (Week 1): Pt will perform bed to chair transfer with modA. PT Short Term Goal 2 - Progress (Week 1): Met PT Short Term Goal 3 (Week 1): Pt will perform sit to stand with modA. PT Short Term Goal 3 - Progress (Week 1): Progressing toward goal PT Short Term Goal 4 (Week 1): Pt will  ambulate x25' with maxA and LRAD. PT Short Term Goal 4 - Progress (Week 1): Progressing toward goal Week 2:  PT Short Term Goal 1 (Week 2): Pt will perform bed mobility with modA. PT Short Term Goal 2 (Week 2): Pt will perform bed to chair transfer with consistent Min A. PT Short Term Goal 3 (Week 2): Pt will perform sit to stand consistently with Mod A to RW. PT Short Term Goal 4 (Week 2): Pt will ambulate x25' with maxA and LRAD. PT Short Term Goal 5 (Week 2): Pt will initiate w/c mobility training.  Skilled Therapeutic Interventions/Progress Updates:  Session 1: Patient seated in w/c on entrance to room. Patient alert and agreeable to PT session. Pt's nurse applying warm blanket wrapped to RLE for muscle pain mgmt. When leaving room for therapy gym, MD arrives for morning rounding and pt assessment.   Therapeutic Activity: Transfers: Patient performed squat pivot transfer w/c to mat table toward R side with Mod A and 2 attempts to reach mat table. Pt is able to perform lateral scoot to R side along mat table with CGA/ supervision. Return to L side with supervision and pt able to continue lateral scoot transfer into w/c with supervision/ CGA. Provided verbal cues for technique, hand placement, and BLE progression. STS x3 to RW with mod A with push from w/c armrest with LUE.   Gait Training: Pt guided in ambulation 12' x1 using LUE with support at hallway handrail and Max A with +2 for w/c follow. Requires significant vc/ tc for sequencing of weight shifting and LE advancement. Max A provided for upright posture, R knee block into extension and  R foot placement. Pt demos ability to advance RLE when weight is appropriately shifted contralaterally. Pt attempts to reach 25' distance but requests to stop d/t increase in pain to R knee with continued WB.   Neuromuscular Re-ed: NMR facilitated during session with focus on standing balance and tolerance. Pt guided in standing balance activity including  weight shifting, attempts for R ant toe touches or R foot lifts from floor with pt unable to follow vc/ instructions to fully weight shift toward LLE. Pt continues to flex forward when focused on R side and requires up to Mod A with vc for upright posture and shift of focus to L side ability in order to correct. Increasing pain in R knee with continued weight bearing.   NMR performed for improvements in motor control and coordination, balance, sequencing, judgement, and self confidence/ efficacy in performing all aspects of mobility at highest level of independence.   Patient seated upright in w/c at end of session with brakes locked, belt alarm set, and all needs within reach.    Session 2: Patient seated in w/c on entrance to room. Patient alert and agreeable to PT session. Patient with continued pain at R knee during session.  Therapeutic Activity: Bed Mobility: At end of session, pt performed sit-->supine with Min A . VC/ tc required for technique and effort for RLE to bed surface. Transfers: Patient performed squat pivot transfers with Min/ Mod A . Provided verbal cues for hand positioning and technique throughout.  TA/ NMR: Pt guided in continuous reciprocation of BUE and BLE using NuStep L3 x 40mn, L2 x21065m with focus on RLE muscle activation, motor planning/ sequencing, motor control. Pt requires extensive assist initially for sequencing of motor plan for reciprocating movement for BLE. After 65m72m pt c/o R knee pain with increased flexion. Attempts for pain mgmt with decreased resistance, control of amount of R knee flexion. Grinding palpated in lateral part of knee joint with increased pressure in push into extension. Noted swelling around R knee compared to L. Nustep activity ended d/t pain.   Patient supine in bed  at end of session with brakes locked, bed alarm set, and all needs within reach. Warmed blanket wrapped to upper and lower leg musculature and cold pack placed to R knee for  mgmt of pain and swelling. Pt's nurse notified re: R knee.    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

## 2021-06-08 NOTE — Progress Notes (Signed)
Occupational Therapy Session Note  Patient Details  Name: Marissa Morris MRN: [id number] Date of Birth: 01-10-1961  Today's Date: 06/08/2021 OT Individual Time: 3300-7622 OT Individual Time Calculation (min): 60 min    Short Term Goals: Week 2:  OT Short Term Goal 1 (Week 2): Pt will complete toilet transfer with mod A + LRAD. OT Short Term Goal 2 (Week 2): Pt will don pants at STS level with max A of 1. OT Short Term Goal 3 (Week 2): Pt will tolerate standing with mod A for >71min in prep for standing ADL.  Skilled Therapeutic Interventions/Progress Updates:    Patient in bed, alert and ready for therapy session.  She notes pain in knees that improved with activity/transfers.  Supine to sitting edge of bed with mod A and increased time.  Able to sit with CS, mild lean to left.  Sit to stand with min A, SPT to/from bed, w/c and mat table with mod / max A and facilitation for weight shift and weight bearing on right LE.  UB dressing w/c level with min A, LB dressing mod A, CM in stance max A, oral care set up/CS.   Completed reaching and trunk control activities in unsupported sitting with min cues.  Design copy task with mod cues, clock drawing with significant difficulty placing numbers in correct places, right inattention with adl tasks.  Able to hold pen and write clearly with right hand.  She remained seated in w/c at close of session, seat belt alarm set and call bell in reach.    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

## 2021-06-08 NOTE — Progress Notes (Signed)
Speech Language Pathology Weekly Progress and Session Note  Patient Details  Name: Marissa Morris MRN: [id number] Date of Birth: 24-Nov-1961  Beginning of progress report period: June 01, 2021 End of progress report period: June 08, 2021  Today's Date: 06/08/2021 SLP Individual Time: 30 minutes   Short Term Goals: Week 1: SLP Short Term Goal 1 (Week 1): Pt will increase word finding at sentence level to min A with verbal cues. SLP Short Term Goal 1 - Progress (Week 1): Met SLP Short Term Goal 2 (Week 1): Pt will increase written comprehension at sentence level to min A visual and verbal cues. SLP Short Term Goal 2 - Progress (Week 1): Not met SLP Short Term Goal 3 (Week 1): Pt will orient to date and location with visual aids x85% min A. SLP Short Term Goal 3 - Progress (Week 1): Met SLP Short Term Goal 4 (Week 1): Pt will follow 2 step commands in 70% of opportunities with mod A verbal and visual cues. SLP Short Term Goal 4 - Progress (Week 1): Met  New Short Term Goals: Week 2: SLP Short Term Goal 1 (Week 2): Pt will orient to date and location with visual aids x90% with supervision A verbal cues SLP Short Term Goal 2 (Week 2): Pt will increase word finding at conversation level with Min A verbal cues SLP Short Term Goal 3 (Week 2): Pt will increase written comprehension at sentence level to min A visual and verbal cues. SLP Short Term Goal 4 (Week 2): Pt will follow 2 step commands in 85% of opportunities with supervision A verbal cues  Weekly Progress Updates: Patient is has made steady progress and has met 3 of 4 short-term goals. Patient demonstrates improvement with expressive language at basic conversational level, although continues to exhibit difficulty during structured language tasks. Currently requiring min assist for word finding and overall communication. Requires initial cues to scan environment for orientation and completes orientation tasks with 75% accuracy and Min A verbal  cues. Improvement with following 2-3 step directions as evidenced by 90% accuracy given Min A verbal cues. Participation is limited at times due to complaints of pain. Requires min-to-mod A for functional problem solving. SLP currently facilitating use of memory notebook to promote functional recall. Patient would continue to benefit from skilled ST intervention to further maximize cognitive and language functions and functional independence prior to discharge. Patient progressing toward LTG.   Intensity: minimum of 1-2x/day, 30 to 90 minutes Frequency: 3 to 5 out of 7 days  Duration/Length of Stay: 7/14  Treatment/Interventions: Cognitive remediation/compensations, cueing hierarchy, functional tasks, internal/external aids, patient/family education, speech/language facilitation, therapeutic exercise   Daily Session Skilled Therapeutic Interventions: Skilled ST treatment performed with focus on cognitive goals. Patient referred to calendar as orientation visual to orient to date with Min cues to comprehend when she arrived to inpatient rehab. SLP facilitated education and use of description strategy with Min-to-Mod A verbal cues for effectiveness. Patient perseverated on leg and hand discomfort which impacted her attention to task this date. Nurse was present and provided patient with pain medication and cream during session. Patient was passed off to OT. Continue per ST POC.    Therapy/Group: Individual Therapy  Patty Sermons 06/08/2021, 4:21 PM

## 2021-06-09 NOTE — Progress Notes (Signed)
Speech Language Pathology Daily Session Note  Patient Details  Name: Trinetta Alemu MRN: [id number] Date of Birth: 1961-05-28  Today's Date: 06/09/2021 SLP Individual Time: 1415-1500 SLP Individual Time Calculation (min): 45 min  Short Term Goals: Week 2: SLP Short Term Goal 1 (Week 2): Pt will orient to date and location with visual aids x90% with supervision A verbal cues SLP Short Term Goal 2 (Week 2): Pt will increase word finding at conversation level with Min A verbal cues SLP Short Term Goal 3 (Week 2): Pt will increase written comprehension at sentence level to min A visual and verbal cues. SLP Short Term Goal 4 (Week 2): Pt will follow 2 step commands in 85% of opportunities with supervision A verbal cues  Skilled Therapeutic Interventions: Skilled ST treatment performed with focus cognitive-communication goals. Patient relied on use of external orientation aides (calendar, memory notebook) to orient to date. Although she inconsistently recalled the current date, she displayed the ability to independently refer to her calendar throughout treatment with supervision A verbal cues for clarification. SLP facilitated thought organization and verbal reasoning task with Mod-to-max A verbal cues. She required consistent reminders for task directions (50-70% of the time) with limited carry over due to impaired recall and alternating attention skills. Patient independently requested for SLP to record details from today's session in memory notebook. Patient attempted to write but it was unintelligible. Patient was left in chair with alarm activated and needs within reach. Continue per ST POC.     Pain Pain Assessment Pain Scale: 0-10 Pain Score: 0-No pain  Therapy/Group: Individual Therapy  Tamala Ser 06/09/2021, 2:19 PM

## 2021-06-09 NOTE — Progress Notes (Signed)
Physical Therapy Session Note  Patient Details  Name: Marissa Morris MRN: [id number] Date of Birth: Sep 07, 1961  Today's Date: 06/09/2021 PT Individual Time: 1005-1103 PT Individual Time Calculation (min): 58 min   Short Term Goals: Week 2:  PT Short Term Goal 1 (Week 2): Pt will perform bed mobility with modA. PT Short Term Goal 2 (Week 2): Pt will perform bed to chair transfer with consistent Min A. PT Short Term Goal 3 (Week 2): Pt will perform sit to stand consistently with Mod A to RW. PT Short Term Goal 4 (Week 2): Pt will ambulate x25' with maxA and LRAD. PT Short Term Goal 5 (Week 2): Pt will initiate w/c mobility training.  Skilled Therapeutic Interventions/Progress Updates:  Patient seated in w/c on entrance to room. Patient alert and agreeable to PT session. Relates that her R leg pain is improved this day. She has already had pain gel applied to musculature and will want heat applied after therapy session.  Therapeutic Activity: Transfers: Patient performed STS with Min/ Mod A +2 and SPVT transfers with Min/ Mod A +2 using RW throughout session. Squat pivot to L with CGA. Provided block to R knee to prevent knee buckling and verbal cues for pivot stepping and maintaining upright posture and midline orientation.  Gait Training:  Patient ambulated 12 feet using RW with Max A and +2 for w/c follow. Demonstrated flexed forward posture with melt to R with fatigue. Required Max A for R foot placement on advancement, appropriate weight shift prior to stepping and sequencing of weight shift to contralateral step progression. Provided vc/ tc for all throughout.  Neuromuscular Re-ed: NMR facilitated during session with focus on standing balance and sequencing of weight shift to contralateral foot clearance. Pt guided in upright stance and midline orientation with increased R hemibody activation and extension. Standing toe touches to 4" step while seated and in standing in order to sequence  shift to L and foot clearance of R as well as facilitating motor control of hip/ knee flexors to clear step. Requires Mod A to bring RLE to step. NMR performed for improvements in motor control and coordination, balance, sequencing, judgement, and self confidence/ efficacy in performing all aspects of mobility at highest level of independence.   Patient seated upright in w/c at end of session with brakes locked, belt alarm set, and all needs within reach.     Therapy Documentation Precautions:  Precautions Precautions: Fall Precaution Comments: Rt hemiparesis with tone in UE/LE Restrictions Weight Bearing Restrictions: No   Therapy/Group: Individual Therapy  Loel Dubonnet PT, DPT 06/09/2021, 12:46 PM

## 2021-06-09 NOTE — Progress Notes (Signed)
Occupational Therapy Session Note  Patient Details  Name: Marissa Morris MRN: [id number] Date of Birth: 07-31-1961  Today's Date: 06/09/2021 OT Individual Time: 0907-1000 OT Individual Time Calculation (min): 53 min + 44 min   Short Term Goals: Week 2:  OT Short Term Goal 1 (Week 2): Pt will complete toilet transfer with mod A + LRAD. OT Short Term Goal 2 (Week 2): Pt will don pants at STS level with max A of 1. OT Short Term Goal 3 (Week 2): Pt will tolerate standing with mod A for >57mn in prep for standing ADL.  Skilled Therapeutic Interventions/Progress Updates:    Session 1 ([phone]: Pt received semi-reclined in bed, no c/o pain, but perseverates slightly on "getting her mind right this morning", agreeable to therapy. Session focus on self-care retraining + RUE NMR + STS  in prep for improved ADL performance. Came to sitting EOB with CGA and increased time with use of bed rail. Squat-pivot to w/c with mod A + 2 , mod Vcs for technique. Doffed shirt min A to pull over head, bathed UB seated at sink close S, donned new shirt  close S + min Vcs for hemitechnique. Donned pants with mod A + 2 for STS at sink and assist to pull pants over B hips. Additionally, completed grooming close S seated at sink. Self-propelled w/c to gym with overall mod to max A to incorporate RUE and to navigate hallway obstacles on R side. Completed 1x7 STS with overall min A + 2 for balance + to block R knee. Mod Vcs to facilitate forward trunk flexion and correct posterior bias in standing. C/o R knee pain post activity, ceases with rest. Finally, completed 1x10 chest press with 1 lb dowel rod +  R shoulder flexion with overall min A to facilitate correct technique.   Pt left seated in w/c with safety belt alarm alarm engaged, call bell in reach, and all immediate needs met.    Session 2 [phone]: Pt received seated in w/c, denies pain, agreeable to therapy. Session focus on RUE NMR/FMC in prep for improved  bimanual ADL performance. W/c transport to and from gym total A 2/2 time management and energy conservation. Participated in various table top activities to facilitate R elbow extension + shoulder flexion including placing/removing level 1 + 2 resistive clothes pins, matching cards, holding towel against wall all above shoulder height. Req min to mod A throughout to facilitate triceps activation and to prevent trunk compensation. Assessed B grip strength with the following results:  R: 10, 12, 11; average of 11 lbs L: 28, 34, 32; average of 31 lbs  Finally, provided level 1 resistive theraputty and guided through the following exercises: putty squeezes, digit flexion, rolling putty into ball/log shape, and removing beads. Max A to recall activities to write in memory notebook.   Pt left seated in w/c with safety belt with safety belt alarm engaged, call bell in reach, and all immediate needs met.    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  Therapy/Group: Individual Therapy  RVolanda NapoleonMS, OTR/L   06/09/2021, 6:41 AM

## 2021-06-09 NOTE — Progress Notes (Signed)
Room phone replaced, pt can now receive and make calls

## 2021-06-10 MED ORDER — BUPIVACAINE HCL (PF) 0.25 % IJ SOLN
10.0000 mL | Freq: Once | INTRAMUSCULAR | Status: DC
  Filled 2021-06-10 (×2): qty 10

## 2021-06-10 MED ORDER — BETAMETHASONE SOD PHOS & ACET 6 (3-3) MG/ML IJ SUSP
12.0000 mg | Freq: Once | INTRAMUSCULAR | Status: DC
  Filled 2021-06-10 (×2): qty 2

## 2021-06-10 NOTE — Progress Notes (Addendum)
Speech Language Pathology Daily Session Note  Patient Details  Name: Marissa Morris MRN: [id number] Date of Birth: [birth year]  Today's Date: 06/10/2021 SLP Individual Time: 1045-1130 SLP Individual Time Calculation (min): 45 min  Short Term Goals: Week 2: SLP Short Term Goal 1 (Week 2): Pt will orient to date and location with visual aids x90% with supervision A verbal cues SLP Short Term Goal 2 (Week 2): Pt will increase word finding at conversation level with Min A verbal cues SLP Short Term Goal 3 (Week 2): Pt will increase written comprehension at sentence level to min A visual and verbal cues. SLP Short Term Goal 4 (Week 2): Pt will follow 2 step commands in 85% of opportunities with supervision A verbal cues  Skilled Therapeutic Interventions: Skilled ST intervention performed with focus on cognitive goals. SLP facilitated sustained attention, problem solving, and memory task using novel card game. Patient followed 2-step commands with Min A verbal cues, and min-to-mod A verbal and visual cues for processing and recall of task directions. Facilitated use of written cues to enhance recall, in which patient referred to and utilized with Min A. Min A verbal/visual cues for orientation to date. Initially thought it was Friday until cued to refer to her calendar and memory notebook for correct date and DOW. SLP wrote in memory notebook prior to conclusion of session. Patient was left in chair with alarm activated and needs within reach. Continue per ST POC.     Pain Pain Assessment Pain Scale: 0-10 Pain Score: 0-No pain Faces Pain Scale: Hurts a little bit Pain Location: Knee Pain Orientation: Right  Therapy/Group: Individual Therapy  Tamala Ser 06/10/2021, 12:50 PM

## 2021-06-10 NOTE — Progress Notes (Signed)
Physical Therapy Session Note  Patient Details  Name: Marissa Morris MRN: [id number] Date of Birth: 04-03-1961  Today's Date: 06/10/2021 PT Individual Time: 0908-1004 PT Individual Time Calculation (min): 56 min   Short Term Goals: Week 1:  PT Short Term Goal 1 (Week 1): Pt will perform bed mobility with modA. PT Short Term Goal 1 - Progress (Week 1): Progressing toward goal PT Short Term Goal 2 (Week 1): Pt will perform bed to chair transfer with modA. PT Short Term Goal 2 - Progress (Week 1): Met PT Short Term Goal 3 (Week 1): Pt will perform sit to stand with modA. PT Short Term Goal 3 - Progress (Week 1): Progressing toward goal PT Short Term Goal 4 (Week 1): Pt will ambulate x25' with maxA and LRAD. PT Short Term Goal 4 - Progress (Week 1): Progressing toward goal Week 2:  PT Short Term Goal 1 (Week 2): Pt will perform bed mobility with modA. PT Short Term Goal 2 (Week 2): Pt will perform bed to chair transfer with consistent Min A. PT Short Term Goal 3 (Week 2): Pt will perform sit to stand consistently with Mod A to RW. PT Short Term Goal 4 (Week 2): Pt will ambulate x25' with maxA and LRAD. PT Short Term Goal 5 (Week 2): Pt will initiate w/c mobility training.   Skilled Therapeutic Interventions/Progress Updates:    Pt sitting in chair with seatbelt on upon entry and consents to physical therapy session. Pt wheeled to 4w gym for energy and time conservation.   Squat pivot transfers wheelchair <> mat table x2 with Min-Moderate manual assistance. Max/continuous mutlimodal cuing for BLE/BUE set up, sequencing, and weight shifting forward throughout transfer.   Scooting EOB towards left and then right with CGA-minA. Pt requires max multimodal cuing for setup, sequencing, and weight shifting forward to allow for buttock clearance before lateral movements. Pt was unable to demonstrate the ability to recall set up and sequencing when asked by therapist, requiring frequent cuing for motor  planning.   Balance, weight shifting, and pregait activities in parallel bars. Min-mod assistance with sit<>stands after skilled cuing of hand placement, weight shifting forward, and LLE foot placement.  Pt demonstrate ability to initiate right quad activation to bring the RLE into extension with tactile and verbal cuing then progressing to requiring increased manual assistance for knee extension later in session.  Pt performed 3 bouts of standing balance with 5 mini-squats at the end of each bout.  Pregait stepping 4 steps forwards<>backwards LLE and then RLE. Pt demonstrated ability to step RLE, however, requires manual guidance to prevent RLE adduction.  Weight shifting towards the left and right for NMR to allow for the ability to safely/confidently take larger steps BLE. Pt reports increased knee pain in RLE as session progressed, especially with weight shifting that was moderately relieved with sitting.  BUE support on parallel bars, mod-max assistance for RLE block, tactile/verbal cues to facilitate upright posture, and weight shifting pelvis forward/activation of hip extensors throughout parallel bar activities.   Trialed blue rocker anterior support AFO due to decreased control in R quad to limit knee buckling. Noted no improvement in right knee control with AFO during standing or with pregait stepping.   Pt sitting in chair with seatbelt alarm on.  Call bell, phone, and tray within reach. No other needs expressed at this time.   Therapy Documentation Precautions:  Precautions Precautions: Fall Precaution Comments: Rt hemiparesis with tone in UE/LE Restrictions Weight Bearing Restrictions: No  Pain: Pain Assessment Pain Scale: Faces Pain Score: 10-Worst pain ever Faces Pain Scale: Hurts a little bit Pain Type: Chronic pain Pain Location: Knee Pain Orientation: Right Pain Descriptors / Indicators: Aching Pain Onset: On-going Pain Intervention: Repositioning     Therapy/Group: Individual Therapy  Raye Wiens, SPT 06/10/2021, 10:09 AM

## 2021-06-10 NOTE — Progress Notes (Signed)
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 No results for input(s): WBC, HGB, HCT, PLT in the last 72 hours.  No results for input(s): NA, K, CL, CO2, GLUCOSE, BUN, CREATININE, CALCIUM in the last 72 hours.   Intake/Output Summary (Last 24 hours) at 06/10/2021 1204 Last data filed at 06/09/2021 2243 Gross per 24 hour  Intake 1061 ml  Output --  Net 1061 ml         Physical Exam: Vital Signs Blood pressure 128/76, pulse 70, temperature 98.1 F (36.7 C), temperature source Oral, resp. rate 16, height 5\' 3"  (1.6 m), weight 73.2 kg, SpO2 99 %.  General: No acute distress Mood and affect are appropriate Heart: Regular rate and rhythm no rubs murmurs or extra sounds Lungs: Clear to auscultation, breathing unlabored, no rales or wheezes Abdomen: Positive bowel sounds, soft nontender to palpation, nondistended Extremities: No clubbing, cyanosis, or edema Skin: No evidence of breakdown, no evidence of rash  Neurologic: Cranial nerves II through XII intact, motor strength is 5/5 in left and 3- right deltoid, bicep, tricep, grip, hip flexor, knee extensors, ankle dorsiflexor and plantar flexor Sensation absent LT RUE and RLE  Musculoskeletal: No pain with ankle range of motion on the right there is pain with knee range of motion.  Knee without erythema, minimal effusion No swelling at ankle hypersensitivity at foot, mild latral ankle tenderness but no pain with passive inversion   Assessment/Plan: 1. Functional deficits which require 3+ hours per day of interdisciplinary therapy in a comprehensive inpatient rehab setting. Physiatrist is providing close team supervision and 24 hour management of active  medical problems listed below. Physiatrist and rehab team continue to assess barriers to discharge/monitor patient progress toward functional and medical goals  Care Tool:  Bathing    Body parts bathed by patient: Chest, Abdomen, Left upper leg, Face, Left arm, Right arm   Body parts bathed by helper: Front perineal area, Buttocks, Right upper leg, Right lower leg, Left lower leg     Bathing assist Assist Level: Supervision/Verbal cueing     Upper Body Dressing/Undressing Upper body dressing   What is the patient wearing?: Pull over shirt    Upper body assist Assist Level: Minimal Assistance - Patient > 75%    Lower Body Dressing/Undressing Lower body dressing      What is the patient wearing?: Pants     Lower body assist Assist for lower body dressing: Moderate Assistance - Patient 50 - 74%     Toileting Toileting Toileting Activity did not occur 2244 and hygiene only): N/A (no void or bm)  Toileting assist Assist for toileting: Maximal Assistance - Patient 25 - 49%     Transfers Chair/bed transfer  Transfers assist     Chair/bed transfer assist level: Minimal Assistance - Patient > 75%     Locomotion Ambulation   Ambulation assist      Assist level: Maximal Assistance - Patient 25 -  49% Assistive device: Walker-rolling Max distance: 12 ft   Walk 10 feet activity   Assist  Walk 10 feet activity did not occur: Safety/medical concerns  Assist level: Maximal Assistance - Patient 25 - 49% Assistive device: Walker-rolling   Walk 50 feet activity   Assist Walk 50 feet with 2 turns activity did not occur: Safety/medical concerns         Walk 150 feet activity   Assist Walk 150 feet activity did not occur: Safety/medical concerns         Walk 10 feet on uneven surface  activity   Assist Walk 10 feet on uneven surfaces activity did not occur: Safety/medical concerns         Wheelchair     Assist Will patient  use wheelchair at discharge?: No Type of Wheelchair: Manual    Wheelchair assist level: Minimal Assistance - Patient > 75% Max wheelchair distance: 78'    Wheelchair 50 feet with 2 turns activity    Assist        Assist Level: Minimal Assistance - Patient > 75%   Wheelchair 150 feet activity     Assist          Blood pressure 128/76, pulse 70, temperature 98.1 F (36.7 C), temperature source Oral, resp. rate 16, height 5\' 3"  (1.6 m), weight 73.2 kg, SpO2 99 %.   Medical Problem List and Plan: 1.  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  Status post right frontal ventriculostomy for hydrocephalus             -patient may  shower             -ELOS/Goals: 7/14 cont CIR PT, OT, SLP   -Continue  2.  Impaired mobility -DVT/anticoagulation: Continue Subcutaneous heparin initiated 05/18/2021             -antiplatelet therapy: N/A 3. Pain Management: Tylenol as needed  Right knee pain, suspect osteoarthritis, tricompartmental OA, continue voltaren gel - will do steroid injection today  although may be difficult to inject due to severity of OA 4. Mood: Provigil 100 mg daily             -antipsychotic agents: N/A 5. Neuropsych: This patient is capable of making decisions on her own behalf. 6. Skin/Wound Care: Routine skin checks 7. Fluids/Electrolytes/Nutrition: Routine in and outs with follow-up chemistries 8.  Seizure prophylaxis.  Keppra completed 9.  Hypertension.  Continue Norvasc 10 mg daily, hydralazine 20 mg every 8 hours, lisinopril 40 mg daily, labetalol 200 mg 3 times daily.  Monitor with increased mobility Vitals:   06/09/21 2125 06/10/21 0522  BP: (!) 141/89 128/76  Pulse: 77 70  Resp:  16  Temp:  98.1 F (36.7 C)  SpO2:  99%  Controlled, 06/10/2021 10.  Hyperglycemia.  Hemoglobin A1c 5.7.  SSI  11.  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

## 2021-06-10 NOTE — Progress Notes (Signed)
Patient ID: Marissa Morris, female   DOB: 1961-04-24, 60 y.o.   MRN: [id number] Team Conference Report to Patient/Family  Team Conference discussion was reviewed with the patient and caregiver, including goals, any changes in plan of care and target discharge date.  Patient and caregiver express understanding and are in agreement.  The patient has a target discharge date of 06/25/21.  SW met with pt, called sister at bedside. Provided conference updates and addressed questions and concerns. Pt hopeful injection will improve her therapy experience. Informed pt of Min A ambulating vs. Functional WC at d/c. No other questions or concerns, sw will cont to follow up.   [name] 06/10/2021, 1:33 PM

## 2021-06-10 NOTE — Progress Notes (Signed)
Occupational Therapy Session Note  Patient Details  Name: Marissa Morris MRN: [id number] Date of Birth: 12-21-1960  Today's Date: 06/10/2021 OT Individual Time: 1657-9038 OT Individual Time Calculation (min): 54 min    Short Term Goals: Week 2:  OT Short Term Goal 1 (Week 2): Pt will complete toilet transfer with mod A + LRAD. OT Short Term Goal 2 (Week 2): Pt will don pants at STS level with max A of 1. OT Short Term Goal 3 (Week 2): Pt will tolerate standing with mod A for >58mn in prep for standing ADL.  Skilled Therapeutic Interventions/Progress Updates:    Pt received semi-reclined in bed finishing breakfast, agreeable to therapy. Session focus on self-care retraining + toileting. C/o generalized RUE/RLE pain, upon supine > sitting EOB c/o R knee pain that relieves when air cast is doffed. Total A bed level to don B socks, teds, and R air cast. Squat-pivot to her L > w/c with mod A to lift/pivot hips. Doffed hospital gown min A to untie, donned new shirt close S + vcs for hemi technique. Bathed UB at sink close S, declined shower this date 2/2 fatigue. Donned pants with overall mod A to thread BLE + pull over hips in standing, mod A for STS at sink. Pt becoming emotionally labile/perseverative on R hand numbness/generalized pain, becoming tearful "I can't move like this." RN present and aware. Therapeutic use of self utilized to direct pt back to therapy tasks as hand, pt becoming less tearful. Squat-pivot to and from toilet with overall mod A with use of L grab bar. Max A for LB clothing management, close S for seated anterior pericare post continent void of bladder.  Pt opting to remain in w/c, req assist to redon B shoes and air cast (total A).   Pt left seated in w/c with safety belt alarm engaged, call bell in reach, and all immediate needs met.    Therapy Documentation Precautions:  Precautions Precautions: Fall Precaution Comments: Rt hemiparesis with tone in  UE/LE Restrictions Weight Bearing Restrictions: No  Pain: c/o generalized RUE/RLE/R knee pain, did not quantify   ADL: See Care Tool for more details.  Therapy/Group: Individual Therapy  RVolanda NapoleonMS, OTR/L  06/10/2021, 6:49 AM

## 2021-06-10 NOTE — Progress Notes (Addendum)
Physical Therapy Session Note  Patient Details  Name: Marissa Morris MRN: [id number] Date of Birth: [birth year]  Today's Date: 06/10/2021 PT Individual Time: 4098-1191   40mn  Short Term Goals: Week 1:  PT Short Term Goal 1 (Week 1): Pt will perform bed mobility with modA. PT Short Term Goal 1 - Progress (Week 1): Progressing toward goal PT Short Term Goal 2 (Week 1): Pt will perform bed to chair transfer with modA. PT Short Term Goal 2 - Progress (Week 1): Met PT Short Term Goal 3 (Week 1): Pt will perform sit to stand with modA. PT Short Term Goal 3 - Progress (Week 1): Progressing toward goal PT Short Term Goal 4 (Week 1): Pt will ambulate x25' with maxA and LRAD. PT Short Term Goal 4 - Progress (Week 1): Progressing toward goal Week 2:  PT Short Term Goal 1 (Week 2): Pt will perform bed mobility with modA. PT Short Term Goal 2 (Week 2): Pt will perform bed to chair transfer with consistent Min A. PT Short Term Goal 3 (Week 2): Pt will perform sit to stand consistently with Mod A to RW. PT Short Term Goal 4 (Week 2): Pt will ambulate x25' with maxA and LRAD. PT Short Term Goal 5 (Week 2): Pt will initiate w/c mobility training.   Skilled Therapeutic Interventions/Progress Updates:    Pt sitting in wheelchair and states she is feeling much better than this morning. Pt consents to physical therapy upon arrival.   Wheelchair mobility ~100 ft Max A. Frequent verbal cuing to scan R environment. Manual facilitation with steering and prevention of hitting the wall on the right side. Multimodal cuing for wc mobility technique and motor planning with LLE/BUE to promote energy efficient movements. Pt able to sustain movement adjustments for about two propulsion and then requires repeat of prior cuing.   Gait training in the hall 126fx 4 and 154f 2 RW with [place] (wheelchair follow for safety). Sit<>stand transitions with MinA for LE/UE placement and verbal cuing for terminal  transition to standing with upright posture. Pt demonstrated decreased RLE knee buckle, therapist transitioned to no longer blocking right knee in stance. Verbal and tactile cuing for sequencing BLE and RW. Manual facilitation of RW forwards and weight shifting at pelvis onto LLE in order to advance RLE while guiding RLE to prevent adduction. Verbal and visual cuing to maintain upright posture throughout gait training.  Mirror used to provide visual feedback during all but last 90f68f gait training.   Pt sitting in wheelchair with seatbelt alarm on, call bell and phone within reach at end of session. No further needs expressed.   Therapy Documentation Precautions:  Precautions Precautions: Fall Precaution Comments: Rt hemiparesis with tone in UE/LE Restrictions Weight Bearing Restrictions: No    Therapy/Group: Individual Therapy  Shakeeta Godette, SPT 06/10/2021, 5:02 PM

## 2021-06-10 NOTE — Patient Care Conference (Addendum)
Inpatient RehabilitationTeam Conference and Plan of Care Update Date: 06/10/2021   Time: 09:59 AM    Patient Name: Marissa Morris      Medical Record Number: [id number]  Date of Birth: Oct 27, 1961 Sex: Female         Room/Bed: 4W04C/4W04C-01 Payor Info: Payor: BLUE CROSS BLUE SHIELD / Plan: BCBS OTHER / Product Type: *No Product type* /    Admit Date/Time:  05/30/2021  4:15 PM  Primary Diagnosis:  Thalamic hemorrhage [facility])  Hospital Problems: Principal Problem:   Thalamic hemorrhage [facility])    Expected Discharge Date: Expected Discharge Date: 06/25/21  Team Members Present: Physician leading conference: Dr. Claudette Laws Care Coodinator Present: Lavera Guise, BSW;Tajae Maiolo Lambert Mody, RN, BSN, CRRN Nurse Present: Chana Bode, RN PT Present: Grier Rocher, PT OT Present: Other (comment) Annye English, OT) SLP Present: Colin Benton, SLP PPS Coordinator present : Fae Pippin, SLP     Current Status/Progress Goal Weekly Team Focus  Bowel/Bladder   Incontinent of b/b  Regain continenence      Swallow/Nutrition/ Hydration             ADL's   S UBB, grooming, min A UBD, mod LBD, toileting, toilet transfer via squat-pivot; RUE [place]umm[place] level III to IV; hand V; slightly labile/self-limiting at times, but much improved  S for grooming, min A for ADL/transfers  sit <> stands when able, sitting balance/core, ADL retraining, RUE NMR, hemitechniques, decreasing R lean   Mobility   Bed mobility = Mod A; Squat Pivot to L = CGA/ Min A and to R= Min/ Mod A, STS = Mod A, staff transfers = STEDY, gait = Max A with HR or RW for 12 feet  Bed mobility = SUP; Transfers = Min A; Gait = Min A for 35ft with LRAD  NMR for R hemibody motor control, bed mobility, transfers, standing tolerance/ balance, gait, w/c mobility, continued pain management   Communication   Min A  Supervision for comprehension and expression  use of word finding strategies at sentence and conversational level,  comprehension of complex information   Safety/Cognition/ Behavioral Observations  Min-to-mod A  Min A  orientation, short-term recall (using external aids), problem solving, sustained attention   Pain   Moderate pain to bilateral kness and ankles.  Pain <3/10      Skin   Closed head incision, otherwise intact skin.  Maintain skin integrity.        Discharge Planning:  Patient discharging home with sister and Shanda Bumps to assist 24/7   Team Discussion: Thalamic pain syndrome treated with Gabepentin and arthritic right knee pain limits weight bearing. Lethargy (common post thalamic stroke) addressed with Provigil per MD. Patient is easily distracted by pain and external stimuli.  Patient on target to meet rehab goals: Currently supervision for upper body care and mod assist for lower body bathing and dressing and, toileting. Max assist for ambulation up to 12' with antalgic response to right knee pain. Requires supervision for communication and cues for complex comprehension and work finding. Goals set for supervision overal with exception of min assist for lower body ADLs and transfers  *See Care Plan and progress notes for long and short-term goals.   Revisions to Treatment Plan:  Working on problem solving work finding, complex problem solving and comprehension  Teaching Needs: Safety, medications, transfers, etc.  Current Barriers to Discharge: Decreased caregiver support and Home enviroment access/layout  Possible Resolutions to Barriers: Family education     Medical Summary Current Status: Occ incont of  bowel adn bladder, RIght side neurogenic pain, occ emotionally labile  Barriers to Discharge: Medical stability   Possible Resolutions to [employer] Focus: cont work on bowel and bladder cont, RIght knee arthritis pain also neurogenic pain on RIght side   Continued Need for Acute Rehabilitation Level of Care: The patient requires daily medical management by a physician  with specialized training in physical medicine and rehabilitation for the following reasons: Direction of a multidisciplinary physical rehabilitation program to maximize functional independence : Yes Medical management of patient stability for increased activity during participation in an intensive rehabilitation regime.: Yes Analysis of laboratory values and/or radiology reports with any subsequent need for medication adjustment and/or medical intervention. : Yes   I attest that I was present, lead the team conference, and concur with the assessment and plan of the team.   Chana Bode B 06/10/2021, 1:01 PM

## 2021-06-11 MED ORDER — GABAPENTIN 300 MG PO CAPS
300.0000 mg | ORAL_CAPSULE | Freq: Three times a day (TID) | ORAL | Status: DC
  Administered 2021-06-11 – 2021-06-14 (×9): 300 mg via ORAL
  Filled 2021-06-11 (×9): qty 1

## 2021-06-11 NOTE — Progress Notes (Signed)
Physical Therapy Session Note  Patient Details  Name: Marissa Morris MRN: [id number] Date of Birth: [birth year]  Today's Date: 06/11/2021 PT Individual Time:  -      Short Term Goals: Week 1:  PT Short Term Goal 1 (Week 1): Pt will perform bed mobility with modA. PT Short Term Goal 1 - Progress (Week 1): Progressing toward goal PT Short Term Goal 2 (Week 1): Pt will perform bed to chair transfer with modA. PT Short Term Goal 2 - Progress (Week 1): Met PT Short Term Goal 3 (Week 1): Pt will perform sit to stand with modA. PT Short Term Goal 3 - Progress (Week 1): Progressing toward goal PT Short Term Goal 4 (Week 1): Pt will ambulate x25' with maxA and LRAD. PT Short Term Goal 4 - Progress (Week 1): Progressing toward goal Week 2:  PT Short Term Goal 1 (Week 2): Pt will perform bed mobility with modA. PT Short Term Goal 2 (Week 2): Pt will perform bed to chair transfer with consistent Min A. PT Short Term Goal 3 (Week 2): Pt will perform sit to stand consistently with Mod A to RW. PT Short Term Goal 4 (Week 2): Pt will ambulate x25' with maxA and LRAD. PT Short Term Goal 5 (Week 2): Pt will initiate w/c mobility training.   Skilled Therapeutic Interventions/Progress Updates:    Pt sitting in chair with seatbelt alarm on upon arrival. Pt consents to physical therapy. Pt wheeled to 4w gym for time conservation.   Standing balance with RW performed with reaching task and mirror for visual feedback.  Pt completed with ModA and intermittent MaxA to promote stability when melting into a flexed posture. Pt able to self correct with verbal cuing and visual feedback from the mirror. Reaching for 8 horse shoes on left side to place on right with LUE cross-body movements and then replace back on left side to promote weight shifting right and left in standing. Pt demonstrated improved standing endurance by remaining on feet throughout first round and then requiring a rest break before repeating activity  with RUE.  Throughout standing activity, verbal cuing for posture/activation of posterior chain and manual assistance with weight shifting. Therapist attempted multimodal cuing to facilitate WB through RLE heel and increase knee extension in order allow for equal WB through BLE and improved balance. Pt unable to demonstrate motor planning to follow cuing and shifts weight posteriorly/laterally towards the left when manual facilitation of R knee into extension is attempted.   Mini squats completed in parallel bars with BUE support, ModA. Pt completed 2x10 with rest inbetween. Mutli-modal cuing for upright posture in standing and visual feedback from mirror. Noted RLE adduction, increased right knee flexion, and weight shift towards the RLE with mini squats that moderately improved by increasing BOS and preventing adduction of right knee.   Wheelchair mobility training from 4w gym to room. Theraband placed around right wheel rim to increase traction for improved RUE participation in propulsion. First ~72f focused on BUE technique with one rest break in the middle. The next ~164fwith focus on LLE technique, followed by ~3525ff BUE and LLE technique.  Therapist pushed wheelchair the rest of the way back to the room secondary to fatigue.  MaxA throughout wheelchair mobility in order to assist in controlling direction of wheelchair and verbal cuing/manual facilitation for simultaneous and symmetric movements of BUE with hand under right forearm to guide. Pt is unable to sustain motor planning with wheelchair mobility and requires continuous  multimodal cuing.   Pt sitting in chair with seatbelt alarm on and call bell within reach at the end of the session.        Therapy Documentation Precautions:  Precautions Precautions: Fall Precaution Comments: Rt hemiparesis with tone in UE/LE Restrictions Weight Bearing Restrictions: No  Pain: Pain Assessment Pain Scale: 0-10 Pain Score: 0-No pain Faces  Pain Scale: Hurts a little bit   Therapy/Group: Individual Therapy  Ivah Girardot, SPT 06/11/2021, 3:18 PM

## 2021-06-11 NOTE — Progress Notes (Signed)
Speech Language Pathology Daily Session Note  Patient Details  Name: Marissa Morris MRN: [id number] Date of Birth: [birth year]  Today's Date: 06/11/2021 SLP Individual Time: 0071-2197 SLP Individual Time Calculation (min): 45 min  Short Term Goals: Week 2: SLP Short Term Goal 1 (Week 2): Pt will orient to date and location with visual aids x90% with supervision A verbal cues SLP Short Term Goal 2 (Week 2): Pt will increase word finding at conversation level with Min A verbal cues SLP Short Term Goal 3 (Week 2): Pt will increase written comprehension at sentence level to min A visual and verbal cues. SLP Short Term Goal 4 (Week 2): Pt will follow 2 step commands in 85% of opportunities with supervision A verbal cues  Skilled Therapeutic Interventions: Skilled ST intervention performed with focus on cognitive goals. SLP facilitated short-term memory task in which patient was able to recall functional list with 57% accuracy with no cues, progressing to 85% accuracy with use of Min A written support. Minimal word finding difficulty noted at conversational level. Patient exhibited ability to identify and repair word finding breakdowns at Sup A verbal cues. Patient requested for SLP to write down timeline of events from patient's hospitalization/rehab in memory notebook. Patient summarized tasks during session in memory notebook with ~50% legibility. Required Min A verbal cues to orient to date, even with reference to visual aids in room. Patient was left in wheelchair with alarm activated and needs within reach. Continue per ST POC.    Pain Pain Assessment Pain Scale: 0-10 Pain Score: 0-No pain Pain Type: Acute pain Pain Location: Knee Pain Orientation: Left;Right Pain Descriptors / Indicators: Aching Pain Frequency: Constant Pain Onset: On-going Patients Stated Pain Goal: 5 Pain Intervention(s): Repositioned  Therapy/Group: Individual Therapy  Esten Dollar T Damyan Corne 06/11/2021, 11:12 AM

## 2021-06-11 NOTE — Progress Notes (Signed)
Nutrition Follow-up  DOCUMENTATION CODES:   Not applicable  INTERVENTION:  Continue Ensure Enlive po BID, each supplement provides 350 kcal and 20 grams of protein   Encourage adequate PO intake.   NUTRITION DIAGNOSIS:   Increased nutrient needs related to  (therapy) as evidenced by estimated needs; ongoing  GOAL:   Patient will meet greater than or equal to 90% of their needs; met  MONITOR:   PO intake, Supplement acceptance, Skin, Weight trends, Labs, I & O's  REASON FOR ASSESSMENT:   Malnutrition Screening Tool    ASSESSMENT:   60 year old female with history of hypertension presenting with right side weakness nausea vomiting facial droop with dysarthria. 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 Pt underwent right frontal ventriculostomy for hydrocephalus. Due to patient's right side weakness and dysarthria admitted to CIR.  Meal completion has been 100%. Intake has improved. Pt currently has Ensure ordered and has been consuming them. RD to continue with current orders to aid in caloric and protein needs. Pt encouraged to eat her food at meals and to drink her supplements.   Labs and medications reviewed.   Diet Order:   Diet Order             Diet regular Room service appropriate? Yes with Assist; Fluid consistency: Thin  Diet effective now                   EDUCATION NEEDS:   Not appropriate for education at this time  Skin:  Skin Assessment: Reviewed RN Assessment  Last BM:  6/29  Height:   Ht Readings from Last 1 Encounters:  05/30/21 _0  (1.6 m)    Weight:   Wt Readings from Last 1 Encounters:  06/06/21 73.2 kg   BMI:  Body mass index is 28.59 kg/m.  Estimated Nutritional Needs:   Kcal:  1800-2000  Protein:  90-100 grams  Fluid:  >/= 1.8 L/day  Corrin Parker, MS, RD, LDN RD pager number/after hours weekend pager number on Amion.

## 2021-06-11 NOTE — Progress Notes (Signed)
Occupational Therapy Session Note  Patient Details  Name: Marissa Morris MRN: [id number] Date of Birth: 05/18/1961  Today's Date: 06/11/2021 OT Individual Time: 5374-8270 OT Individual Time Calculation (min): 58 min + 42 min   Short Term Goals: Week 2:  OT Short Term Goal 1 (Week 2): Pt will complete toilet transfer with mod A + LRAD. OT Short Term Goal 2 (Week 2): Pt will don pants at STS level with max A of 1. OT Short Term Goal 3 (Week 2): Pt will tolerate standing with mod A for >45mn in prep for standing ADL.  Skilled Therapeutic Interventions/Progress Updates:     Session 1 [phone]: Pt received semi-reclined in bed, agreeable to therapy. Session focus on nonpharm pain management strategies + self-care retraining in prep for improved ADL performance. Pt initially upset, relating that she was unable to receive pain rx all night and that she had been in significant pain. Pt becoming tearful, therapeutic self utilized throughout session to reaffirm pt of her rehab progress + importance of self-regulation to prevent pain from worsening. Discussed nonpharmacologic pain management strategies including muscle relaxation, deep breathing, positioning, ice/heat. Pt agreeable to shower for benefits of muscle relaxation/pain relief. Came to sitting EOB close S + use of bed rail. Stedy transfer to shower 2/2 time and pain management with min A to power up. Bathed full-body with close S at the seated level in shower, able to cleanse periarea through cut-out in shower chair. Pt cont to demonstrate good incorporation of RUE into functional tasks, although noted to frequently drop wash-cloth with her R hand. Reports significant pain relief and relaxation post shower. Stedy transfer to w/c. Donned shirt close S, donned brief/pants with min A to thread RLE + pull over R hip in standing. Min A for STS with use of sink as BUE support. Total A to don Teds, B socks, aircast, and shoes. Completed oral care seated at  sink close S.  Pt left seated in w/c with safety belt alarm engaged, call bell in reach, and all immediate needs met.     Session 2 [phone]: Pt received seated in w/c, no c/o pain but reports being upset she has yet to receive her cortisone shot to her R knee, agreeable to therapy. Session focus on problem solving, attention to task, and RUE NMR in prep for improved ADL performance. Pt self-propelled w/c to and from gym with overall max A to propel and to navigate R obstacles. Max Vcs throughout for technique. Stood to sHum[employer] Incwith overall min to power up and mod to prevent R LOB. Utilized BUE to [employer] Seated, participated in multiple rounds of JInes Bloomer demonstrates appropriate game play after verbal and visual cues, but overall req mod Vcs throughout for strategy game play, Utilized RUE with min A from therapist to stabilize jPalisadestower. Additionally, played giant connect four, having to reach outside BOS to retrieve pieces with her RUE. Overall, pt able to manipulate pieces, but noted to req increased time and frequent dropping of pieces with her R hand, but able to pick up off of floor.   Pt left seated in w/c with safety belt alarm engaged, call bell in reach, and all immediate needs met.   Therapy Documentation Precautions:  Precautions Precautions: Fall Precaution Comments: Rt hemiparesis with tone in UE/LE Restrictions Weight Bearing Restrictions: No  Pain: see session notes ADL: See Care Tool for more details.   Therapy/Group: Individual Therapy  RVolanda NapoleonMS, OTR/L   06/11/2021,  6:43 AM

## 2021-06-11 NOTE — Progress Notes (Signed)
PROGRESS NOTE   Subjective/Complaints:  Pt states she did not want pills for pain last noc    Review of systems unable to obtain secondary to aphasia  Objective:   No results found. No results for input(s): WBC, HGB, HCT, PLT in the last 72 hours.  No results for input(s): NA, K, CL, CO2, GLUCOSE, BUN, CREATININE, CALCIUM in the last 72 hours.   Intake/Output Summary (Last 24 hours) at 06/11/2021 0920 Last data filed at 06/11/2021 0720 Gross per 24 hour  Intake 960 ml  Output --  Net 960 ml         Physical Exam: Vital Signs Blood pressure 114/77, pulse 74, temperature 98.3 F (36.8 C), resp. rate 18, height 5\' 3"  (1.6 m), weight 73.2 kg, SpO2 97 %.  General: No acute distress Mood and affect are appropriate Heart: Regular rate and rhythm no rubs murmurs or extra sounds Lungs: Clear to auscultation, breathing unlabored, no rales or wheezes Abdomen: Positive bowel sounds, soft nontender to palpation, nondistended Extremities: No clubbing, cyanosis, or edema Skin: No evidence of breakdown, no evidence of rash   Neurologic: Cranial nerves II through XII intact, motor strength is 5/5 in left and 3- right deltoid, bicep, tricep, grip, hip flexor, knee extensors, ankle dorsiflexor and plantar flexor Sensation absent LT RUE and RLE  Musculoskeletal: No pain with ankle range of motion on the right there is pain with knee range of motion.  Knee without erythema, minimal effusion No swelling at ankle hypersensitivity at foot, mild latral ankle tenderness but no pain with passive inversion   Assessment/Plan: 1. Functional deficits which require 3+ hours per day of interdisciplinary therapy in a comprehensive inpatient rehab setting. Physiatrist is providing close team supervision and 24 hour management of active medical problems listed below. Physiatrist and rehab team continue to assess barriers to discharge/monitor  patient progress toward functional and medical goals  Care Tool:  Bathing    Body parts bathed by patient: Chest, Abdomen, Left upper leg, Face, Left arm, Right arm   Body parts bathed by helper: Front perineal area, Buttocks, Right upper leg, Right lower leg, Left lower leg     Bathing assist Assist Level: Supervision/Verbal cueing     Upper Body Dressing/Undressing Upper body dressing   What is the patient wearing?: Pull over shirt    Upper body assist Assist Level: Supervision/Verbal cueing    Lower Body Dressing/Undressing Lower body dressing      What is the patient wearing?: Pants     Lower body assist Assist for lower body dressing: Moderate Assistance - Patient 50 - 74%     Toileting Toileting Toileting Activity did not occur (Clothing management and hygiene only): N/A (no void or bm)  Toileting assist Assist for toileting: Moderate Assistance - Patient 50 - 74%     Transfers Chair/bed transfer  Transfers assist     Chair/bed transfer assist level: Minimal Assistance - Patient > 75%     Locomotion Ambulation   Ambulation assist      Assist level: Maximal Assistance - Patient 25 - 49% Assistive device: Walker-rolling Max distance: 12 ft   Walk 10 feet activity   Assist  Walk 10 feet activity did not occur: Safety/medical concerns  Assist level: Maximal Assistance - Patient 25 - 49% Assistive device: Walker-rolling   Walk 50 feet activity   Assist Walk 50 feet with 2 turns activity did not occur: Safety/medical concerns         Walk 150 feet activity   Assist Walk 150 feet activity did not occur: Safety/medical concerns         Walk 10 feet on uneven surface  activity   Assist Walk 10 feet on uneven surfaces activity did not occur: Safety/medical concerns         Wheelchair     Assist Will patient use wheelchair at discharge?: No Type of Wheelchair: Manual    Wheelchair assist level: Minimal Assistance -  Patient > 75% Max wheelchair distance: 85'    Wheelchair 50 feet with 2 turns activity    Assist        Assist Level: Minimal Assistance - Patient > 75%   Wheelchair 150 feet activity     Assist          Blood pressure 114/77, pulse 74, temperature 98.3 F (36.8 C), resp. rate 18, height 5\' 3"  (1.6 m), weight 73.2 kg, SpO2 97 %.   Medical Problem List and Plan: 1.  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  Status post right frontal ventriculostomy for hydrocephalus             -patient may  shower             -ELOS/Goals: 7/14 cont CIR PT, OT, SLP   - 2.  Impaired mobility -DVT/anticoagulation: Continue Subcutaneous heparin initiated 05/18/2021             -antiplatelet therapy: N/A 3. 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 Mood: Provigil 100 mg daily             -antipsychotic agents: N/A 5. Neuropsych: This patient is capable of making decisions on her own behalf. 6. Skin/Wound Care: Routine skin checks 7. Fluids/Electrolytes/Nutrition: Routine in and outs with follow-up chemistries 8.  Seizure prophylaxis.  Keppra completed 9.  Hypertension.  Continue Norvasc 10 mg daily, hydralazine 20 mg every 8 hours, lisinopril 40 mg daily, labetalol 200 mg 3 times daily.  Monitor with increased mobility Vitals:   06/10/21 1936 06/11/21 0517  BP: 129/74 114/77  Pulse: 71 74  Resp: 18 18  Temp: 98.5 F (36.9 C) 98.3 F (36.8 C)  SpO2: 100% 97%  Controlled, 06/11/2021 10.  Hyperglycemia.  Hemoglobin A1c 5.7.  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

## 2021-06-12 NOTE — Progress Notes (Signed)
Speech Language Pathology Daily Session Note  Patient Details  Name: Marissa Morris MRN: [id number] Date of Birth: [birth year]  Today's Date: 06/12/2021 SLP Individual Time: 2409-7353 SLP Individual Time Calculation (min): 45 min  Short Term Goals: Week 2: SLP Short Term Goal 1 (Week 2): Pt will orient to date and location with visual aids x90% with supervision A verbal cues SLP Short Term Goal 2 (Week 2): Pt will increase word finding at conversation level with Min A verbal cues SLP Short Term Goal 3 (Week 2): Pt will increase written comprehension at sentence level to min A visual and verbal cues. SLP Short Term Goal 4 (Week 2): Pt will follow 2 step commands in 85% of opportunities with supervision A verbal cues  Skilled Therapeutic Interventions: Skilled ST intervention performed with focus on cognitive goals. Patient was oriented to date, location, and situation with use of orientation visual. No additional cueing was necessary. Patient followed 2-step commands with 90% accuracy and Min A verbal cues for wheelchair-to-toilet transfer, and 75% accuracy with min A verbal cues during more structured cognitive tasks. Facilitated attention, memory, and problem solving with novel card game with Mod A verbal cues. Patient was left in wheelchair with alarm activated and needs within reach. Continue per ST POC.    Pain Pain Assessment Pain Scale: 0-10 Pain Score: 0-No pain  Therapy/Group: Individual Therapy  Tamala Ser 06/12/2021, 12:16 PM

## 2021-06-12 NOTE — Progress Notes (Signed)
PROGRESS NOTE   Subjective/Complaints:  Still has right knee pain, also has pain along the entire right side, discussed stroke related pain.   Review of systems limited secondary to aphasia, has word finding issues  Objective:   No results found. No results for input(s): WBC, HGB, HCT, PLT in the last 72 hours.  No results for input(s): NA, K, CL, CO2, GLUCOSE, BUN, CREATININE, CALCIUM in the last 72 hours.   Intake/Output Summary (Last 24 hours) at 06/12/2021 0910 Last data filed at 06/11/2021 1806 Gross per 24 hour  Intake 480 ml  Output --  Net 480 ml         Physical Exam: Vital Signs Blood pressure 116/66, pulse 65, temperature 98.5 F (36.9 C), temperature source Oral, resp. rate 18, height 5\' 3"  (1.6 m), weight 73.2 kg, SpO2 94 %.  General: No acute distress Mood and affect are appropriate Heart: Regular rate and rhythm no rubs murmurs or extra sounds Lungs: Clear to auscultation, breathing unlabored, no rales or wheezes Abdomen: Positive bowel sounds, soft nontender to palpation, nondistended Extremities: No clubbing, cyanosis, or edema Skin: No evidence of breakdown, no evidence of rash  Neurologic: Cranial nerves II through XII intact, motor strength is 5/5 in left and 3- right deltoid, bicep, tricep, grip, hip flexor, knee extensors, ankle dorsiflexor and plantar flexor Sensation absent LT RUE and RLE  Musculoskeletal: No pain with ankle range of motion on the right there is pain with knee range of motion.  Knee without erythema, minimal effusion No swelling at ankle hypersensitivity at foot, mild latral ankle tenderness but no pain with passive inversion   Assessment/Plan: 1. Functional deficits which require 3+ hours per day of interdisciplinary therapy in a comprehensive inpatient rehab setting. Physiatrist is providing close team supervision and 24 hour management of active medical problems listed  below. Physiatrist and rehab team continue to assess barriers to discharge/monitor patient progress toward functional and medical goals  Care Tool:  Bathing    Body parts bathed by patient: Chest, Abdomen, Left upper leg, Face, Left arm, Right arm, Front perineal area, Buttocks, Right upper leg, Right lower leg, Left lower leg   Body parts bathed by helper: Front perineal area, Buttocks, Right upper leg, Right lower leg, Left lower leg     Bathing assist Assist Level: Supervision/Verbal cueing     Upper Body Dressing/Undressing Upper body dressing   What is the patient wearing?: Pull over shirt    Upper body assist Assist Level: Supervision/Verbal cueing    Lower Body Dressing/Undressing Lower body dressing      What is the patient wearing?: Pants     Lower body assist Assist for lower body dressing: Minimal Assistance - Patient > 75%     Toileting Toileting Toileting Activity did not occur (Clothing management and hygiene only): N/A (no void or bm)  Toileting assist Assist for toileting: Moderate Assistance - Patient 50 - 74%     Transfers Chair/bed transfer  Transfers assist     Chair/bed transfer assist level: Minimal Assistance - Patient > 75%     Locomotion Ambulation   Ambulation assist      Assist level: Maximal Assistance -  Patient 25 - 49% Assistive device: Walker-rolling Max distance: 12 ft   Walk 10 feet activity   Assist  Walk 10 feet activity did not occur: Safety/medical concerns  Assist level: Maximal Assistance - Patient 25 - 49% Assistive device: Walker-rolling   Walk 50 feet activity   Assist Walk 50 feet with 2 turns activity did not occur: Safety/medical concerns         Walk 150 feet activity   Assist Walk 150 feet activity did not occur: Safety/medical concerns         Walk 10 feet on uneven surface  activity   Assist Walk 10 feet on uneven surfaces activity did not occur: Safety/medical concerns          Wheelchair     Assist Will patient use wheelchair at discharge?: No Type of Wheelchair: Manual    Wheelchair assist level: Minimal Assistance - Patient > 75% Max wheelchair distance: 11'    Wheelchair 50 feet with 2 turns activity    Assist        Assist Level: Minimal Assistance - Patient > 75%   Wheelchair 150 feet activity     Assist          Blood pressure 116/66, pulse 65, temperature 98.5 F (36.9 C), temperature source Oral, resp. rate 18, height 5\' 3"  (1.6 m), weight 73.2 kg, SpO2 94 %.   Medical Problem List and Plan: 1.  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  Status post right frontal ventriculostomy for hydrocephalus             -patient may  shower             -ELOS/Goals: 7/14 cont CIR PT, OT, SLP   - 2.  Impaired mobility -DVT/anticoagulation: Continue Subcutaneous heparin initiated 05/18/2021             -antiplatelet therapy: N/A 3. 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 Mood: Provigil 100 mg daily             -antipsychotic agents: N/A 5. Neuropsych: This patient is capable of making decisions on her own behalf. 6. Skin/Wound Care: Routine skin checks 7. Fluids/Electrolytes/Nutrition: Routine in and outs with follow-up chemistries 8.  Seizure prophylaxis.  Keppra completed 9.  Hypertension.  Continue Norvasc 10 mg daily, hydralazine 20 mg every 8 hours, lisinopril 40 mg daily, labetalol 200 mg 3 times daily.  Monitor with increased mobility Vitals:   06/11/21 2004 06/12/21 0449  BP: (!) 156/79 116/66  Pulse: 76 65  Resp: 17 18  Temp: 98.5 F (36.9 C) 98.5 F (36.9 C)  SpO2: 99% 94%  Controlled, 06/12/2021 10.  Hyperglycemia.  Hemoglobin A1c 5.7.  SSI  11.   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

## 2021-06-12 NOTE — Progress Notes (Signed)
Occupational Therapy Session Note  Patient Details  Name: Marissa Morris MRN: [id number] Date of Birth: 1960/12/28  Today's Date: 06/12/2021 OT Individual Time: 9753-0051 OT Individual Time Calculation (min): 53 min + 42 min   Short Term Goals: Week 2:  OT Short Term Goal 1 (Week 2): Pt will complete toilet transfer with mod A + LRAD. OT Short Term Goal 2 (Week 2): Pt will don pants at STS level with max A of 1. OT Short Term Goal 3 (Week 2): Pt will tolerate standing with mod A for >17mn in prep for standing ADL.  Skilled Therapeutic Interventions/Progress Updates:    Session 1 [phone]: Pt received semi-reclined in bed finishing breakfast, agreeable to therapy. C/o intense RUE/RLE numbness/pain, becoming tearful. Pt requesting to shower, but adamantly declining OOB activity prior to receiving voltaren gel. Therapeutic use of self utilized to remind pt of importance of stress/emotional management + relation to increasing pain. Guided pt through progressive muscle relaxation audio guide with little success 2/2 pt's perseveration on pain, but pt expresses interest and listening again at another date. Nursing staff present to administer gel, pt then agreeable to get OOB. Came to sitting EOB with close S and increased time. Squat-pivot to w/c with mod A to lift and pivot hips. Total A to don B teds, socks, air cast, and shoes. Doffed shirt with min A to pull over head, completed UBB and donned new shirt with close S seated at sink. Donned brief/pants with overall mod A to thread RUE + pull over B hips in standing, mod A for STS at sink.   Pt left seated in w/c with safety belt alarm engaged, call bell in reach, and all immediate needs met.    Session 2 [phone]: Pt received seated in w/c with boyfriend present, agreeable to therapy. Session focus on RUE NMR + pain management strategies + functional mobility in prep for improved ADL performance. Self-propelled w/c ~ 25 ft with overall mod to max A  to generate power, mod Vcs throughout for technique. Cont to c/o RUE pain/numbness, applied heat pack with good results in pain reduction, pt requesting to cont to use heat pack after session. [facility], mod A req to prevent trunk compensation, overall, able to hit all targets with multiple attempts. Completed 1x15 chest press + B shoulder flexion with use of mirror for visual feedback to facilitate improved R shoulder flexion elbow extension. Finally, complete 1x10 STS with use of chair progressing from min A to CGA.   Pt left in w/c with boyfriend present with safety belt alarm engaged, call bell in reach, and all immediate needs met.    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  Therapy/Group: Individual Therapy  RVolanda NapoleonMS, OTR/L  06/12/2021, 6:41 AM

## 2021-06-12 NOTE — Procedures (Signed)
Knee injection Right  Indication: Right knee pain not relieved by medication management and other conservative care.  Informed consent was obtained after describing risks and benefits of the procedure with the patient, this includes bleeding, bruising, infection and medication side effects. The patient wishes to proceed and has given written consent. The patient was placed in a recumbent position. The medial aspect of the knee was marked and prepped with Betadine and alcohol. It was then entered with a 25-gauge 1/2 inch needle and 1 mL of 1% lidocaine was injected into the skin and subcutaneous tissue. Then another 27-gauge 1.5 inch needle was inserted into the knee joint. After negative draw back for blood, a solution containing one ML of 6mg  per mL betamethasone and 3 mL of 0.25% Marcaine  were injected. The patient tolerated the procedure well. Post procedure instructions were given.

## 2021-06-12 NOTE — Progress Notes (Signed)
Physical Therapy Session Note  Patient Details  Name: Marissa Morris MRN: [id number] Date of Birth: [birth year]  Today's Date: 06/12/2021 PT Individual Time: 1418-1530 PT Individual Time Calculation (min): 72 min   Short Term Goals: Week 1:  PT Short Term Goal 1 (Week 1): Pt will perform bed mobility with modA. PT Short Term Goal 1 - Progress (Week 1): Progressing toward goal PT Short Term Goal 2 (Week 1): Pt will perform bed to chair transfer with modA. PT Short Term Goal 2 - Progress (Week 1): Met PT Short Term Goal 3 (Week 1): Pt will perform sit to stand with modA. PT Short Term Goal 3 - Progress (Week 1): Progressing toward goal PT Short Term Goal 4 (Week 1): Pt will ambulate x25' with maxA and LRAD. PT Short Term Goal 4 - Progress (Week 1): Progressing toward goal Week 2:  PT Short Term Goal 1 (Week 2): Pt will perform bed mobility with modA. PT Short Term Goal 2 (Week 2): Pt will perform bed to chair transfer with consistent Min A. PT Short Term Goal 3 (Week 2): Pt will perform sit to stand consistently with Mod A to RW. PT Short Term Goal 4 (Week 2): Pt will ambulate x25' with maxA and LRAD. PT Short Term Goal 5 (Week 2): Pt will initiate w/c mobility training.  Skilled Therapeutic Interventions/Progress Updates:  Patient seated upright in w/c on entrance to room. Patient alert and agreeable to PT session. Patient relates that she has received a shot of cortisone in her R knee earlier in the day. But her knee continues to have some pain at this time.   Therapeutic Activity: Transfers: Patient performed squat pivot transfer w/c to mat table to R with Min A. STS to RW performed with Min A for power up and vc for upright posture. SPVT transfers Mod A  using RW. Provided verbal cues for technique and sequencing of pivot stepping.  Gait Training:  Patient ambulated 8 feet using RW with Max A for advancement of RLE. Demonstrated flexed forward posture. Cued for attempt at TDWB in order to  decrease pressure to R knee during amb. Pt c/o continued pain at R knee and amb stopped.    Wheelchair Mobility:  Patient propelled wheelchair 75 feet with Min A and vc/ tc provided for increased push with RUE to maintain straight path.  Neuromuscular Re-ed: NMR facilitated during session with focus onstanding balance, upright posture. Pt guided in card matching task with cards placed to pt's L side and board with matching cards placed to pt's R side at head height. Pt guided in use of either R or LUE. Maintains upright posture with two LOB to R side requiring up to Mod A to maintain balance with reach of LUE and support with RUE to walker. Pt able to maintain stance to complete task in 4.5 min. Pt also guided in rows using level 2 theraband in order to promote increased strengthening and motor control of scapular retractors and trunk extensors in order to promote improved motor control of posture. NMR performed for improvements in motor control and coordination, balance, sequencing, judgement, and self confidence/ efficacy in performing all aspects of mobility at highest level of independence.   Patient seated upright  in w/c at end of session with brakes locked, belt alarm set, and all needs within reach. RLE wrapped with warm blanket and hot pack to R thigh for pain mgmt of muscle pain/ soreness.      Therapy Documentation Precautions:  Precautions Precautions: Fall Precaution Comments: Rt hemiparesis with tone in UE/LE Restrictions Weight Bearing Restrictions: No  Therapy/Group: Individual Therapy  Alger Simons PT, DPT 06/12/2021, 6:32 PM

## 2021-06-13 DIAGNOSIS — M1711 Unilateral primary osteoarthritis, right knee: Secondary | ICD-10-CM

## 2021-06-13 DIAGNOSIS — I10 Essential (primary) hypertension: Secondary | ICD-10-CM

## 2021-06-13 DIAGNOSIS — M792 Neuralgia and neuritis, unspecified: Secondary | ICD-10-CM

## 2021-06-13 NOTE — Progress Notes (Signed)
PROGRESS NOTE   Subjective/Complaints:  Pt c/o of pain down her right leg from around knee to top of foot. Received "injection" yesterday, waiting for it to "kick in"  ROS: Patient denies fever, rash, sore throat, blurred vision, nausea, vomiting, diarrhea, cough, shortness of breath or chest pain, joint or back pain, headache, or mood change.    Objective:   No results found. No results for input(s): WBC, HGB, HCT, PLT in the last 72 hours.  No results for input(s): NA, K, CL, CO2, GLUCOSE, BUN, CREATININE, CALCIUM in the last 72 hours.   Intake/Output Summary (Last 24 hours) at 06/13/2021 1139 Last data filed at 06/13/2021 0834 Gross per 24 hour  Intake 358 ml  Output --  Net 358 ml        Physical Exam: Vital Signs Blood pressure 114/79, pulse 76, temperature 98.4 F (36.9 C), temperature source Oral, resp. rate 18, height 5\' 3"  (1.6 m), weight 73.2 kg, SpO2 99 %.  Constitutional: No distress . Vital signs reviewed. HEENT: EOMI, oral membranes moist Neck: supple Cardiovascular: RRR without murmur. No JVD    Respiratory/Chest: CTA Bilaterally without wheezes or rales. Normal effort    GI/Abdomen: BS +, non-tender, non-distended Ext: no clubbing, cyanosis, or edema Psych: pleasant and cooperative  Skin: intact Neurologic: Cranial nerves II through XII intact, motor strength is 5/5 in left and 3- right deltoid, bicep, tricep, grip, hip flexor, knee extensors, ankle dorsiflexor and plantar flexor Sensation absent LT RUE and RLE  Musculoskeletal: No pain with ankle range of motion on the right there is pain with knee range of motion.  No obvious right knee effusion. No swelling at ankle hypersensitivity at foot, mild latral ankle tenderness but no pain with passive inversion   Assessment/Plan: 1. Functional deficits which require 3+ hours per day of interdisciplinary therapy in a comprehensive inpatient rehab  setting. Physiatrist is providing close team supervision and 24 hour management of active medical problems listed below. Physiatrist and rehab team continue to assess barriers to discharge/monitor patient progress toward functional and medical goals  Care Tool:  Bathing    Body parts bathed by patient: Right arm, Left arm, Chest, Abdomen, Face   Body parts bathed by helper: Front perineal area, Buttocks, Right upper leg, Right lower leg, Left lower leg     Bathing assist Assist Level: Supervision/Verbal cueing     Upper Body Dressing/Undressing Upper body dressing   What is the patient wearing?: Pull over shirt    Upper body assist Assist Level: Supervision/Verbal cueing    Lower Body Dressing/Undressing Lower body dressing      What is the patient wearing?: Pants, Underwear/pull up     Lower body assist Assist for lower body dressing: Moderate Assistance - Patient 50 - 74%     Toileting Toileting Toileting Activity did not occur (Clothing management and hygiene only): N/A (no void or bm)  Toileting assist Assist for toileting: Moderate Assistance - Patient 50 - 74%     Transfers Chair/bed transfer  Transfers assist     Chair/bed transfer assist level: Maximal Assistance - Patient 25 - 49%     Locomotion Ambulation   Ambulation assist  Assist level: Maximal Assistance - Patient 25 - 49% Assistive device: Walker-rolling Max distance: 12 ft   Walk 10 feet activity   Assist  Walk 10 feet activity did not occur: Safety/medical concerns  Assist level: Maximal Assistance - Patient 25 - 49% Assistive device: Walker-rolling   Walk 50 feet activity   Assist Walk 50 feet with 2 turns activity did not occur: Safety/medical concerns         Walk 150 feet activity   Assist Walk 150 feet activity did not occur: Safety/medical concerns         Walk 10 feet on uneven surface  activity   Assist Walk 10 feet on uneven surfaces activity did  not occur: Safety/medical concerns         Wheelchair     Assist Will patient use wheelchair at discharge?: No Type of Wheelchair: Manual    Wheelchair assist level: Minimal Assistance - Patient > 75% Max wheelchair distance: 32'    Wheelchair 50 feet with 2 turns activity    Assist        Assist Level: Minimal Assistance - Patient > 75%   Wheelchair 150 feet activity     Assist          Blood pressure 114/79, pulse 76, temperature 98.4 F (36.9 C), temperature source Oral, resp. rate 18, height 5\' 3"  (1.6 m), weight 73.2 kg, SpO2 99 %.   Medical Problem List and Plan: 1.  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  Status post right frontal ventriculostomy for hydrocephalus             -patient may  shower             -ELOS/Goals: 7/14 cont CIR PT, OT, SLP   - 2.  Impaired mobility -DVT/anticoagulation: Continue Subcutaneous heparin initiated 05/18/2021             -antiplatelet therapy: N/A 3. 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 Mood: Provigil 100 mg daily             -antipsychotic agents: N/A 5. Neuropsych: This patient is capable of making decisions on her own behalf. 6. Skin/Wound Care: Routine skin checks 7. Fluids/Electrolytes/Nutrition: Routine in and outs with follow-up chemistries 8.  Seizure prophylaxis.  Keppra completed 9.  Hypertension.  Continue Norvasc 10 mg daily, hydralazine 20 mg every 8 hours, lisinopril 40 mg daily, labetalol 200 mg 3 times daily.  Monitor with increased mobility Vitals:   06/12/21 1928 06/13/21 0418  BP: (!) 147/83 114/79  Pulse: 88 76  Resp: 18 18  Temp: 98.1 F (36.7 C) 98.4 F (36.9 C)   SpO2: 99% 99%  Controlled, 06/13/2021 10.  Hyperglycemia.  Hemoglobin A1c 5.7.  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 Hopefully between injection and this, her pain will improved in RLE  LOS: 14 days A FACE TO FACE EVALUATION WAS PERFORMED  08/14/2021 06/13/2021, 11:39 AM

## 2021-06-13 NOTE — Progress Notes (Addendum)
Physical Therapy Session Note  Patient Details  Name: Marissa Morris MRN: [id number] Date of Birth: [birth year]  Today's Date: 06/13/2021 PT Individual Time: 0902-1002; 2993-7169 PT Individual Time Calculation (min): 60 min , 49 min  Short Term Goals:  Week 2:  PT Short Term Goal 1 (Week 2): Pt will perform bed mobility with modA. PT Short Term Goal 2 (Week 2): Pt will perform bed to chair transfer with consistent Min A. PT Short Term Goal 3 (Week 2): Pt will perform sit to stand consistently with Mod A to RW. PT Short Term Goal 4 (Week 2): Pt will ambulate x25' with maxA and LRAD. PT Short Term Goal 5 (Week 2): Pt will initiate w/c mobility training.  Skilled Therapeutic Interventions/Progress Updates:  Tx 1:  Pt seated in wc.  She rated pain R knee 8/10, premedicated.  Pt had TEDS donned and R ankle air cast donned already.  She stated that she got an injuection into R knee yesterday.  neuromuscular re-education via mirror feedback, forced use, multimodal cues for use of Kinetron for alternating reciprocal movements bil LEs at resistance 50 x 25 cycles, unilateral RLe movements with resistance geaded by PT, x 10 cycles x 3.  No increase in pain noted R knee. Seated in wc, R ankle DF with eversion via mirror, x 20 x2   Sit> stand with min assist; stand pivot transfer with mod/max assist to L, RW; max cues for technique.; 2nd trial raised mat> wc to L with mod assist, RW.   Transfer training for transferrring weight forward, via Bobath method of clasped hands in front, sit><squat to touch visual target in front of her, with mod> min assist x 10.  Pt noted to have spinal curvature, R rib hump, LLE longer.  Pt may benefit from R heel lift.  Sit>< supine with min assist for RLE.  At end of session, pt seated in wc with needs at hand and seat belt alarm set.  Tx 2:  Pt seated in wc; air cast on R ankle.  She rated R knee pain 6/10; heat pack on R forearm.  Stand pivot transfer wc> mat table  to R with mod/max assist, RW.  Pt apraxic for moving feet ito turn  PT added 1/3" high heel lift into R shoe. Pt does not fully extend R knee due to knee pathology.  neuromuscular re-education via demo, mirror feedback and multimodal cues for R knee stance control in standing with bil UE support. Use of bil LEs to propel wc x 25' with min assist, max cues.  With Theraband wrap on R wc rim, Use of bil UEs to propel wc x 25' with min assist> supervision.   Attempted gait training with RW but apraxia very limiting.  In parallel bars, gait x 5' forward/backward with mod/max assist and max cues especially for R hand movement and RLE forward progression.   At end of session, pt seated in w/c with heat pack on R forearm, awaiting next therapist.  Seat belt alarm set and needs at hand.     Therapy Documentation Precautions:  Precautions Precautions: Fall Precaution Comments: Rt hemiparesis with tone in UE/LE Restrictions Weight Bearing Restrictions: No       Therapy/Group: Individual Therapy  Sarenity Ramaker 06/13/2021, 10:23 AM

## 2021-06-13 NOTE — Progress Notes (Signed)
Physical Therapy Session Note  Patient Details  Name: Marissa Morris MRN: [id number] Date of Birth: 11-Oct-1961  Today's Date: 06/13/2021 PT Individual Time: 0807-0900; 2233-6122 PT Individual Time Calculation (min): 53 min + 30 min = total 83 minutes   Short Term Goals: Week 1:  PT Short Term Goal 1 (Week 1): Pt will perform bed mobility with modA. PT Short Term Goal 1 - Progress (Week 1): Progressing toward goal PT Short Term Goal 2 (Week 1): Pt will perform bed to chair transfer with modA. PT Short Term Goal 2 - Progress (Week 1): Met PT Short Term Goal 3 (Week 1): Pt will perform sit to stand with modA. PT Short Term Goal 3 - Progress (Week 1): Progressing toward goal PT Short Term Goal 4 (Week 1): Pt will ambulate x25' with maxA and LRAD. PT Short Term Goal 4 - Progress (Week 1): Progressing toward goal Week 2:  PT Short Term Goal 1 (Week 2): Pt will perform bed mobility with modA. PT Short Term Goal 2 (Week 2): Pt will perform bed to chair transfer with consistent Min A. PT Short Term Goal 3 (Week 2): Pt will perform sit to stand consistently with Mod A to RW. PT Short Term Goal 4 (Week 2): Pt will ambulate x25' with maxA and LRAD. PT Short Term Goal 5 (Week 2): Pt will initiate w/c mobility training.  Skilled Therapeutic Interventions/Progress Updates:    First Session: Pt washing up sink side with RN upon arrival. Pt eager to start PT and go to the gym as soon as she is done with morning hygiene.   ADL management at sink side with placing objects to the right side to facilitate awareness to right environment and utilization of RUE. Verbal cuing for fine motor tasks such as unscrewing tooth paste and soap bottles with RUE. Pt able to complete tasks with supervision. Shirt donned with minA for time management and pants donned utilizing BUE with CGA for balance.   Sit to stand transitions during sessions with MinA during sessions with verbal cuing for posture and increased hip extensor  activation. Pt demonstrated ability to sit>stand with no AD prior to connecting to litegait 2x after rest break at [facility].   Litegait over ground used for gait training/NMR for improved bilateral LE motor planning, sequencing, coordination, weight shifting, core/trunk strengthening, and midline orientation (pt continues to have mild R lateral lean secondary to mild pushing). Pt ambulated 70ft, 94ft, 52ft with mod-minA to facilitate weight shifting and initiation/prevention of adduction with advancement of RLE x3 (steering litegait and wheelchair follow). Verbal cues throughout ambulation for sequencing, increased step length on LLE, and increased stance on RLE. Tactile cuing  with forearm for increased right knee extension in stance. Pt required consistent verbal cuing to prolong step off time to prevent a premature step and allow for hip extension on LLE. Rest breaks were given inbetween ambulation bouts.  While standing pt cued for pressure through heel in standing with verbal cue of "push through your heel like when you put on your shoe this morning" to facilitate increased RLE knee extension. Pt unable to demonstrate the same motor planning as donning shoes.   Pt returned to room, sitting in wheelchair with seatbelt alarm on. Call bell within reach.   Second Session:  Pt greeted sitting in chair and consents to starting PT session.   Pt wheeled from room to outdoors for therapeutic use of nature. 51ft of wheelchair mobility training at max A for continuous directional guidance  and manual facilitation of increased RUE range of propulsion with multimodal cuing.   Seated marching BLE alternating x5 and 3 second pause at the top - verbal cuing to facilitate eccentric control of RLE. Then switched to 10x each leg (no pause) continued eccentric control of RLE.  LAQ x10 BLE  Seated reaching activity completed LUE 6 cones with cross over pattern at alternating heights. Repeated with RUE. Multimodal cuing  used with RUE to facilitate utilization, cross body movements secondary, and assist in motor planning. Increased distance as pt became more confident during weight shifting to enhance ability to cross midline on left side and strengthen core musculature with coming back to midline.   Pt wheeled back to room for time management and sitting in wheelchair with seat belt alarm on at the end of session. Call bell and tray within reach, no further needs at this time.   Therapy Documentation Precautions:  Precautions Precautions: Fall Precaution Comments: Rt hemiparesis with tone in UE/LE Restrictions Weight Bearing Restrictions: No  Pain: Pain Assessment Pain Scale: 0-10 Pain Score: 2  Faces Pain Scale: Hurts a little bit Pain Type: Chronic pain Pain Location: Knee Pain Orientation: Right Pain Descriptors / Indicators: Aching  Therapy/Group: Individual Therapy  Dellie Piasecki, SPT 06/13/2021, 9:12 AM

## 2021-06-13 NOTE — Progress Notes (Signed)
Occupational Therapy Session Note  Patient Details  Name: Marissa Morris MRN: [id number] Date of Birth: [birth year]  Today's Date: 06/13/2021 OT Individual Time: 1005-1101 OT Individual Time Calculation (min): 56 min    Short Term Goals: Week 2:  OT Short Term Goal 1 (Week 2): Pt will complete toilet transfer with mod A + LRAD. OT Short Term Goal 2 (Week 2): Pt will don pants at STS level with max A of 1. OT Short Term Goal 3 (Week 2): Pt will tolerate standing with mod A for >79mn in prep for standing ADL.  Skilled Therapeutic Interventions/Progress Updates:    Pt received seated in w/c, cont to c/o generalized RUE/R sided facial "numbness" but, agreeable to therapy. Session focus on functional mobility, static standing balance, and RUE NMR in prep for improved ADL performance. Self-propelled w/c to and from gym with overall mod to max A to propel w/c and to navigate hallway obstacles. Squat-pivot to and from mat table with light min A to pivot hips safely back in chair. Stood to complete 2 peg designs with use of BUE, min A to power up and min A to maintain static standing balance with intermittent UE support. R lean worsens with fatigue. C/o R facial numbness, may be 2/2 clenching jaw as pt tends to do when she concentrates. Provided heat pack with moderate relief. Sat to remove and place resistive clothes pins above shoulder height with min A to facilitate elbow extension and to prevent trunk compensation.   Administered 9HPT with the following results: R: 37 secs L: 28 secs   Pt left seated in w/c with safety belt alarm engaged, call bell in reach, and all immediate needs met.    Therapy Documentation Precautions:  Precautions Precautions: Fall Precaution Comments: Rt hemiparesis with tone in UE/LE Restrictions Weight Bearing Restrictions: No  Pain:  See session note ADL: See Care Tool for more details.   Therapy/Group: Individual Therapy  RVolanda NapoleonMS,  OTR/L  06/13/2021, 6:45 AM

## 2021-06-13 NOTE — Plan of Care (Signed)
  Problem: Consults Goal: RH STROKE PATIENT EDUCATION Description: See Patient Education module for education specifics  Outcome: Progressing   Problem: RH BOWEL ELIMINATION Goal: RH STG MANAGE BOWEL WITH ASSISTANCE Description: STG Manage Bowel with  mod  I Assistance. Outcome: Progressing Goal: RH STG MANAGE BOWEL W/MEDICATION W/ASSISTANCE Description: STG Manage Bowel with Medication with mod I Assistance. Outcome: Progressing   Problem: RH BLADDER ELIMINATION Goal: RH STG MANAGE BLADDER WITH ASSISTANCE Description: STG Manage Bladder With min assist Assistance Outcome: Progressing   Problem: RH SKIN INTEGRITY Goal: RH STG ABLE TO PERFORM INCISION/WOUND CARE W/ASSISTANCE Description: STG Able To Perform Incision/Wound Care With  min Assistance. Outcome: Progressing   Problem: RH SAFETY Goal: RH STG ADHERE TO SAFETY PRECAUTIONS W/ASSISTANCE/DEVICE Description: STG Adhere to Safety Precautions With cues/r eminders Assistance/Device. Outcome: Progressing   Problem: RH PAIN MANAGEMENT Goal: RH STG PAIN MANAGED AT OR BELOW PT'S PAIN GOAL Description: At or below level 4 Outcome: Progressing   Problem: RH KNOWLEDGE DEFICIT Goal: RH STG INCREASE KNOWLEDGE OF DIABETES Description: Patient and family will be able to manage prediabetes with dietary modifications using handouts and educational tools independently Outcome: Progressing Goal: RH STG INCREASE KNOWLEDGE OF HYPERTENSION Description: Patient and family will be able to manage HTN with medications dietary modifications using handouts and educational tools independently Outcome: Progressing Goal: RH STG INCREASE KNOWLEGDE OF HYPERLIPIDEMIA Description: Patient and family will be able to manage HLD  with medications and  dietary modifications using handouts and educational tools independently Outcome: Progressing Goal: RH STG INCREASE KNOWLEDGE OF STROKE PROPHYLAXIS Description: Patient and family will be able to manage  secondary stroke risks with medications and dietary modifications using handouts and educational tools independently Outcome: Progressing   

## 2021-06-13 NOTE — Progress Notes (Signed)
Occupational Therapy Weekly Progress Note  Patient Details  Name: Marissa Morris MRN: [id number] Date of Birth: 1961/10/30  Beginning of progress report period: June 06, 2021 End of progress report period: June 13, 2021  Patient has met 3 of 3 short term goals.  Pt has made significant progress this reporting period, demonstrating improved activity tolerance, functional BUE/BLE strength, static standing balance, and functional use of RUE to perform UB ADL at S level, LB ADL at mod A level, and toilet transfers with mod A via squat-pivot. Pt additionally can perform STS with min A. RUE  at brunstromm level III to IV, R hand at level V. Cont to be highly motivated to participate in therapy.  Patient continues to demonstrate the following deficits: muscle weakness, decreased cardiorespiratoy endurance, impaired timing and sequencing, abnormal tone, unbalanced muscle activation, decreased coordination, and decreased motor planning, decreased attention to right and decreased motor planning, decreased awareness, decreased problem solving, decreased safety awareness, decreased memory, and delayed processing, and decreased standing balance, decreased postural control, hemiplegia, and difficulty maintaining precautions and therefore will continue to benefit from skilled OT intervention to enhance overall performance with BADL and Reduce care partner burden.  Patient progressing toward long term goals..  Continue plan of care.  Volanda Napoleon MS, OTR/L  06/13/2021, 3:27 PM

## 2021-06-14 MED ORDER — GABAPENTIN 300 MG PO CAPS
300.0000 mg | ORAL_CAPSULE | Freq: Two times a day (BID) | ORAL | Status: DC
  Administered 2021-06-14 – 2021-06-19 (×10): 300 mg via ORAL
  Filled 2021-06-14 (×10): qty 1

## 2021-06-14 MED ORDER — GABAPENTIN 600 MG PO TABS
600.0000 mg | ORAL_TABLET | Freq: Every day | ORAL | Status: DC
  Administered 2021-06-14 – 2021-06-21 (×8): 600 mg via ORAL
  Filled 2021-06-14 (×8): qty 1

## 2021-06-14 NOTE — Plan of Care (Signed)
  Problem: Consults Goal: RH STROKE PATIENT EDUCATION Description: See Patient Education module for education specifics  Outcome: Progressing   Problem: RH BOWEL ELIMINATION Goal: RH STG MANAGE BOWEL WITH ASSISTANCE Description: STG Manage Bowel with  mod  I Assistance. Outcome: Progressing Goal: RH STG MANAGE BOWEL W/MEDICATION W/ASSISTANCE Description: STG Manage Bowel with Medication with mod I Assistance. Outcome: Progressing   Problem: RH BLADDER ELIMINATION Goal: RH STG MANAGE BLADDER WITH ASSISTANCE Description: STG Manage Bladder With min assist Assistance Outcome: Progressing   Problem: RH SKIN INTEGRITY Goal: RH STG ABLE TO PERFORM INCISION/WOUND CARE W/ASSISTANCE Description: STG Able To Perform Incision/Wound Care With  min Assistance. Outcome: Progressing   Problem: RH SAFETY Goal: RH STG ADHERE TO SAFETY PRECAUTIONS W/ASSISTANCE/DEVICE Description: STG Adhere to Safety Precautions With cues/r eminders Assistance/Device. Outcome: Progressing   Problem: RH PAIN MANAGEMENT Goal: RH STG PAIN MANAGED AT OR BELOW PT'S PAIN GOAL Description: At or below level 4 Outcome: Progressing   Problem: RH KNOWLEDGE DEFICIT Goal: RH STG INCREASE KNOWLEDGE OF DIABETES Description: Patient and family will be able to manage prediabetes with dietary modifications using handouts and educational tools independently Outcome: Progressing Goal: RH STG INCREASE KNOWLEDGE OF HYPERTENSION Description: Patient and family will be able to manage HTN with medications dietary modifications using handouts and educational tools independently Outcome: Progressing Goal: RH STG INCREASE KNOWLEGDE OF HYPERLIPIDEMIA Description: Patient and family will be able to manage HLD  with medications and  dietary modifications using handouts and educational tools independently Outcome: Progressing Goal: RH STG INCREASE KNOWLEDGE OF STROKE PROPHYLAXIS Description: Patient and family will be able to manage  secondary stroke risks with medications and dietary modifications using handouts and educational tools independently Outcome: Progressing   

## 2021-06-14 NOTE — Progress Notes (Signed)
PROGRESS NOTE   Subjective/Complaints:  Right knee feeling a little better. Still having a lot of pain in both right arm and leg. Feels "raw" when she gets up in the morning esp  ROS: Patient denies fever, rash, sore throat, blurred vision, nausea, vomiting, diarrhea, cough, shortness of breath or chest pain,  headache, or mood change.     Objective:   No results found. No results for input(s): WBC, HGB, HCT, PLT in the last 72 hours.  No results for input(s): NA, K, CL, CO2, GLUCOSE, BUN, CREATININE, CALCIUM in the last 72 hours.   Intake/Output Summary (Last 24 hours) at 06/14/2021 0926 Last data filed at 06/14/2021 3419 Gross per 24 hour  Intake 480 ml  Output --  Net 480 ml        Physical Exam: Vital Signs Blood pressure 135/74, pulse 71, temperature 98.9 F (37.2 C), temperature source Oral, resp. rate 18, height 5\' 3"  (1.6 m), weight 73.2 kg, SpO2 97 %.  Constitutional: No distress . Vital signs reviewed. HEENT: EOMI, oral membranes moist Neck: supple Cardiovascular: RRR without murmur. No JVD    Respiratory/Chest: CTA Bilaterally without wheezes or rales. Normal effort    GI/Abdomen: BS +, non-tender, non-distended Ext: no clubbing, cyanosis, or edema Psych: pleasant and cooperative  Skin: intact Neurologic: Cranial nerves II through XII intact, motor strength is 5/5 in left and 3- right deltoid, bicep, tricep, grip, hip flexor, knee extensors, ankle dorsiflexor and plantar flexor Sensation absent LT RUE and RLE  Musculoskeletal: minimal knee pain with PROM, no effusion  Assessment/Plan: 1. Functional deficits which require 3+ hours per day of interdisciplinary therapy in a comprehensive inpatient rehab setting. Physiatrist is providing close team supervision and 24 hour management of active medical problems listed below. Physiatrist and rehab team continue to assess barriers to discharge/monitor patient  progress toward functional and medical goals  Care Tool:  Bathing    Body parts bathed by patient: Right arm, Left arm, Chest, Abdomen, Face   Body parts bathed by helper: Front perineal area, Buttocks, Right upper leg, Right lower leg, Left lower leg     Bathing assist Assist Level: Supervision/Verbal cueing     Upper Body Dressing/Undressing Upper body dressing   What is the patient wearing?: Pull over shirt    Upper body assist Assist Level: Supervision/Verbal cueing    Lower Body Dressing/Undressing Lower body dressing      What is the patient wearing?: Pants, Underwear/pull up     Lower body assist Assist for lower body dressing: Moderate Assistance - Patient 50 - 74%     Toileting Toileting Toileting Activity did not occur (Clothing management and hygiene only): N/A (no void or bm)  Toileting assist Assist for toileting: Moderate Assistance - Patient 50 - 74%     Transfers Chair/bed transfer  Transfers assist     Chair/bed transfer assist level: Maximal Assistance - Patient 25 - 49%     Locomotion Ambulation   Ambulation assist      Assist level: Maximal Assistance - Patient 25 - 49% Assistive device: Parallel bars Max distance: 5   Walk 10 feet activity   Assist  Walk 10 feet  activity did not occur: Safety/medical concerns  Assist level: Maximal Assistance - Patient 25 - 49% Assistive device: Walker-rolling   Walk 50 feet activity   Assist Walk 50 feet with 2 turns activity did not occur: Safety/medical concerns         Walk 150 feet activity   Assist Walk 150 feet activity did not occur: Safety/medical concerns         Walk 10 feet on uneven surface  activity   Assist Walk 10 feet on uneven surfaces activity did not occur: Safety/medical concerns         Wheelchair     Assist Will patient use wheelchair at discharge?: No Type of Wheelchair: Manual    Wheelchair assist level: Minimal Assistance - Patient >  75% Max wheelchair distance: 25    Wheelchair 50 feet with 2 turns activity    Assist        Assist Level: Minimal Assistance - Patient > 75%   Wheelchair 150 feet activity     Assist          Blood pressure 135/74, pulse 71, temperature 98.9 F (37.2 C), temperature source Oral, resp. rate 18, height 5\' 3"  (1.6 m), weight 73.2 kg, SpO2 97 %.   Medical Problem List and Plan: 1.  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  Status post right frontal ventriculostomy for hydrocephalus             -patient may  shower             -ELOS/Goals: 7/14 cont CIR PT, OT, SLP   - 2.  Impaired mobility -DVT/anticoagulation: Continue Subcutaneous heparin initiated 05/18/2021             -antiplatelet therapy: N/A 3. 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 Mood: Provigil 100 mg daily             -antipsychotic agents: N/A 5. Neuropsych: This patient is capable of making decisions on her own behalf. 6. Skin/Wound Care: Routine skin checks 7. Fluids/Electrolytes/Nutrition: Routine in and outs with follow-up chemistries 8.  Seizure prophylaxis.  Keppra completed 9.  Hypertension.  Continue Norvasc 10 mg daily, hydralazine 20 mg every 8 hours, lisinopril 40 mg daily, labetalol 200 mg 3 times daily.  Monitor with increased mobility Vitals:   06/13/21 1955 06/14/21 0444  BP: (!) 169/88 135/74  Pulse: 79 71  Resp: 18 18  Temp: 98.4 F (36.9 C) 98.9 F (37.2 C)  SpO2: 99% 97%  Controlled, 06/13/2021 10.  Hyperglycemia.  Hemoglobin A1c 5.7.  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   7/3 will increase HS dose to 600mg  to see if this  helps her in the AM  LOS: 15 days A FACE TO FACE EVALUATION WAS PERFORMED  08/14/2021 06/14/2021, 9:26 AM

## 2021-06-15 NOTE — Progress Notes (Signed)
Physical Therapy Session Note  Patient Details  Name: Marissa Morris MRN: [id number] Date of Birth: 08-27-1961  Today's Date: 06/15/2021 PT Individual Time:Session1: 1020-1100; [place]: 8315-1761 PT Individual Time Calculation (min): 40 min & 70 min  Short Term Goals: Week 2:  PT Short Term Goal 1 (Week 2): Pt will perform bed mobility with modA. PT Short Term Goal 2 (Week 2): Pt will perform bed to chair transfer with consistent Min A. PT Short Term Goal 3 (Week 2): Pt will perform sit to stand consistently with Mod A to RW. PT Short Term Goal 4 (Week 2): Pt will ambulate x25' with maxA and LRAD. PT Short Term Goal 5 (Week 2): Pt will initiate w/c mobility training.  Skilled Therapeutic Interventions/Progress Updates:    Session1:  Patient in supine and reports rough morning.  States was kind of going "in and out of it" and nurse and MD in to check her out.  States first therapy session in the bed and continued to report she did not want to "miss her class".   Patient in supine to perform LE therex including heel slides, lateral trunk rotation, hip adductor squeezes in hooklying w/ 5 sec hold x 10, bridging with A on R and rolled to L for R sidelying clamshell hip abduction x 15.  Patient supine to sit with min A.  Seated for BP as noted below.  Did not feel ready to stand as still feeling woozy.  Did request to urinate so obtained BSC and pt performed squat pivot with mod A to drop arm BSC with min A.  Patient completed toileting and hygiene with S.  Patient transferred back to bed with min A.  Sit to supine with A for R LE.  Rolled with S to don brief in supine.  Left with call bell & needs in reach, bed alarm active.   Session2:  Patient in w/c and eager to try and participate more despite having a rough start to the day.  In w/c patient propelled to nursing station about 72' with min A and mod cues for R side awareness and negotiating obstacles on R.  Patient using bilateral UE's to propel w/c  with green t-band on R side.    Patient transferred sit to stand CGA and stand pivot with RW and min A.  Note needing cues for positioning prior to standing and for safety.  Seated on mat patient performed sitting balance completing colored peg design x 2 on peg board cues to use R UE at least 50% of the time and mod cues for completing correctly. Patient seated EOM leaning forward to place hands on bench for sit to modified plank standing with hands on bench x 5 with min A and facilitation through R knee for full extension.  Seated for hamstring curls with green t-band x 10 each leg mod cues.    to stand to RW with min/CGA and cues for foot placement, pt ambulated x 15' with RW and min to mod A cues for feet in middle of walker and for sequencing.  Patient sit to stand hands on hi-lo table for completing PVC pipe design with mod to max cues and min to mod A for R knee extension in stance for 1-2 handed activity. Ambulated 10' back to w/c with mod cues for R LE progression, sequencing for turning and R knee extension in stance.  Patient assisted in w/c to her room and applied lap tray at her request, left with seat belt  alarm active, call bell and needs in reach.  Therapy Documentation Precautions:  Precautions Precautions: Fall Precaution Comments: Rt hemiparesis with tone in UE/LE Restrictions Weight Bearing Restrictions: No  Vital Signs: Orthostatic VS for the past 24 hrs (Last 3 readings):  BP- Lying Pulse- Lying BP- Sitting Pulse- Sitting  06/15/21 1000 133/76 67 130/78 70    Pain: Pain Assessment Faces Pain Scale: Hurts a little bit Pain Type: Acute pain Pain Location: Head Pain Descriptors / Indicators: Aching Pain Onset: On-going Pain Intervention(s): Repositioned    Therapy/Group: Individual Therapy  Elray Mcgregor Sheran Lawless, PT 06/15/2021, 10:59 AM

## 2021-06-15 NOTE — Progress Notes (Signed)
Occupational Therapy Session Note  Patient Details  Name: Marissa Morris MRN: [id number] Date of Birth: September 01, 1961  Today's Date: 06/15/2021 OT Individual Time: 2725-3664   &   1400-1430 OT Individual Time Calculation (min): 55 min    &   30 min   Short Term Goals: Week 3:  OT Short Term Goal 1 (Week 3): Pt will don pants with min A + AE PRN. OT Short Term Goal 2 (Week 3): Pt will complete 3/3 steps toileting tasks with min A. OT Short Term Goal 3 (Week 3): Pt will don B shoes with mod A.  Skilled Therapeutic Interventions/Progress Updates:    AM session:   patient notes increased head, neck and shoulder discomfort on right side, and increased numbness on right side head to leg that started after breakfast this morning.  Nurse tech present at start of session - nursing aware and have contacted MD.  Patient notes that she was in an uncomfortable position during breakfast.  She is alert and oriented, no facial ROM impairments.  Assessed grip strength:  R = 12#, L = 35# (consistent with prior assessment).  Completed repositioning in bed with good tolerance, oral care and face washing at bed level with set up.  Completed light neck and shoulder ROM with good results, trunk stretch and lower body AROM.  Remained in bed for session due to ongoing c/o of right side numbness.  Bed alarm set and call bell in hand at close of session.     PM session:   Patient in bed, alert and states that she is still tired but feeling much better than this morning.  C/o ongoing numbness in right side, but no pain.  Supine to sitting edge of bed with CS.  Sit pivot transfer to/from bedside commode with min A.  Completed toileting mod A for clothing management.  Dressing completed seated edge of bed, set up for OH shirt, CS to donn incontinence brief and pants with min A for clothing mgmt in stance, she is able to donn socks and sneakers, requires mod A for teds and right ankle support.  Short distance ambulation with RW bed to  w/c with min A and cues for sequencing. Awareness of right LE and safety.  She remained seated in w/c, seat belt alarm set and call bell in reach.        Therapy Documentation Precautions:  Precautions Precautions: Fall Precaution Comments: Rt hemiparesis with tone in UE/LE Restrictions Weight Bearing Restrictions: No   Therapy/Group: Individual Therapy  Barrie Lyme 06/15/2021, 7:35 AM

## 2021-06-15 NOTE — Progress Notes (Signed)
PROGRESS NOTE   Subjective/Complaints:  Pt states she felt bad this am, had Right sided HA, she was given Meds and she rested, BP was elevated at that time   ROS: Patient denies fever, rash, sore throat, blurred vision, nausea, vomiting, diarrhea, cough, shortness of breath or chest pain,  headache, or mood change.     Objective:   No results found. No results for input(s): WBC, HGB, HCT, PLT in the last 72 hours.  No results for input(s): NA, K, CL, CO2, GLUCOSE, BUN, CREATININE, CALCIUM in the last 72 hours.   Intake/Output Summary (Last 24 hours) at 06/15/2021 1110 Last data filed at 06/15/2021 0700 Gross per 24 hour  Intake 960 ml  Output --  Net 960 ml         Physical Exam: Vital Signs Blood pressure (!) 170/93, pulse 79, temperature 98.5 F (36.9 C), resp. rate 18, height 5\' 3"  (1.6 m), weight 73.2 kg, SpO2 99 %.   General: No acute distress Mood and affect are appropriate Heart: Regular rate and rhythm no rubs murmurs or extra sounds Lungs: Clear to auscultation, breathing unlabored, no rales or wheezes Abdomen: Positive bowel sounds, soft nontender to palpation, nondistended Extremities: No clubbing, cyanosis, or edema Skin: No evidence of breakdown, no evidence of rash  Neurologic: Cranial nerves II through XII intact, motor strength is 5/5 in left and 3- right deltoid, bicep, tricep, grip, hip flexor, knee extensors, ankle dorsiflexor and plantar flexor Sensation absent LT RUE and RLE, reduced R side of face  Musculoskeletal: minimal knee pain with PROM, no effusion  Assessment/Plan: 1. Functional deficits which require 3+ hours per day of interdisciplinary therapy in a comprehensive inpatient rehab setting. Physiatrist is providing close team supervision and 24 hour management of active medical problems listed below. Physiatrist and rehab team continue to assess barriers to discharge/monitor patient  progress toward functional and medical goals  Care Tool:  Bathing    Body parts bathed by patient: Right arm, Left arm, Chest, Abdomen, Face   Body parts bathed by helper: Front perineal area, Buttocks, Right upper leg, Right lower leg, Left lower leg     Bathing assist Assist Level: Supervision/Verbal cueing     Upper Body Dressing/Undressing Upper body dressing   What is the patient wearing?: Pull over shirt    Upper body assist Assist Level: Supervision/Verbal cueing    Lower Body Dressing/Undressing Lower body dressing      What is the patient wearing?: Pants, Underwear/pull up     Lower body assist Assist for lower body dressing: Moderate Assistance - Patient 50 - 74%     Toileting Toileting Toileting Activity did not occur (Clothing management and hygiene only): N/A (no void or bm)  Toileting assist Assist for toileting: Moderate Assistance - Patient 50 - 74%     Transfers Chair/bed transfer  Transfers assist     Chair/bed transfer assist level: Maximal Assistance - Patient 25 - 49%     Locomotion Ambulation   Ambulation assist      Assist level: Maximal Assistance - Patient 25 - 49% Assistive device: Parallel bars Max distance: 5   Walk 10 feet activity   Assist  Walk 10 feet activity did not occur: Safety/medical concerns  Assist level: Maximal Assistance - Patient 25 - 49% Assistive device: Walker-rolling   Walk 50 feet activity   Assist Walk 50 feet with 2 turns activity did not occur: Safety/medical concerns         Walk 150 feet activity   Assist Walk 150 feet activity did not occur: Safety/medical concerns         Walk 10 feet on uneven surface  activity   Assist Walk 10 feet on uneven surfaces activity did not occur: Safety/medical concerns         Wheelchair     Assist Will patient use wheelchair at discharge?: No Type of Wheelchair: Manual    Wheelchair assist level: Minimal Assistance - Patient >  75% Max wheelchair distance: 25    Wheelchair 50 feet with 2 turns activity    Assist        Assist Level: Minimal Assistance - Patient > 75%   Wheelchair 150 feet activity     Assist          Blood pressure (!) 170/93, pulse 79, temperature 98.5 F (36.9 C), resp. rate 18, height 5\' 3"  (1.6 m), weight 73.2 kg, SpO2 99 %.   Medical Problem List and Plan: 1.  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  Status post right frontal ventriculostomy for hydrocephalus             -patient may  shower             -ELOS/Goals: 7/14 cont CIR PT, OT, SLP   - 2.  Impaired mobility -DVT/anticoagulation: Continue Subcutaneous heparin initiated 05/18/2021             -antiplatelet therapy: N/A 3. 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 Mood: Provigil 100 mg daily             -antipsychotic agents: N/A 5. Neuropsych: This patient is capable of making decisions on her own behalf. 6. Skin/Wound Care: Routine skin checks 7. Fluids/Electrolytes/Nutrition: Routine in and outs with follow-up chemistries 8.  Seizure prophylaxis.  Keppra completed 9.  Hypertension.  Continue Norvasc 10 mg daily, hydralazine 20 mg every 8 hours, lisinopril 40 mg daily, labetalol 200 mg 3 times daily.  Monitor with increased mobility Vitals:   06/15/21 0432 06/15/21 0812  BP: 129/75 (!) 170/93  Pulse: 65 79  Resp: 18 18  Temp: 98.2 F (36.8 C) 98.5 F (36.9 C)  SpO2: 99% 99%  Intermittent elevations of BP  10.  Hyperglycemia.  Hemoglobin A1c 5.7.  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   7/3 will increase HS dose to 600mg    LOS: 16 days A FACE  TO FACE EVALUATION WAS PERFORMED  08/16/21 06/15/2021, 11:10 AM

## 2021-06-16 NOTE — Progress Notes (Signed)
Speech Language Pathology Weekly Progress and Session Note  Patient Details  Name: Marissa Morris MRN: [id number] Date of Birth: 1960/12/29  Beginning of progress report period: June 08, 2021 End of progress report period: June 16, 2021  Today's Date: 06/16/2021 SLP Individual Time: 3086-5784 SLP Individual Time Calculation (min): 45 min  Short Term Goals: Week 2: SLP Short Term Goal 1 (Week 2): Pt will orient to date and location with visual aids x90% with supervision A verbal cues SLP Short Term Goal 1 - Progress (Week 2): Met SLP Short Term Goal 2 (Week 2): Pt will increase word finding at conversation level with Min A verbal cues SLP Short Term Goal 2 - Progress (Week 2): Met SLP Short Term Goal 3 (Week 2): Pt will increase written comprehension at sentence level to min A visual and verbal cues. SLP Short Term Goal 3 - Progress (Week 2): Not met SLP Short Term Goal 4 (Week 2): Pt will follow 2 step commands in 85% of opportunities with supervision A verbal cues SLP Short Term Goal 4 - Progress (Week 2): Met    New Short Term Goals: Week 3: SLP Short Term Goal 1 (Week 3): STGs=LTGs due to ELOS  Weekly Progress Updates: Patient has made functional gains and has met 3 of 4 short-term goals. Patient demonstrates consistent use of visual aids for orientation to date and refers to visuals throughout session due to decreased recall of information. She demonstrates consistent orientation to situation and location. She continues to require min-to-mod A for mildly complex problem solving, comprehension of complex instructions, and short-term recall. She is at supervision level for word finding at conversational level, min A for word finding during structured language tasks, and demonstrates improved awareness and ability to repair communication breakdowns. Continuing to address written comprehension which with positively impact her ability to write in memory notebook for improved recall. Patient  education is ongoing. Have not had an opportunity to see patient's family during session yet although would benefit from family education. Patient is progressing toward LTGs. Patient would continue to benefit from skilled ST intervention to further maximize cognitive and language functions and functional independence prior to discharge.  Intensity: Minumum of 1-2 x/day, 30 to 90 minutes Frequency: 3 to 5 out of 7 days Duration/Length of Stay: 7/14 Treatment/Interventions: Cognitive remediation/compensation;Patient/family education;Speech/Language facilitation;Therapeutic Exercise;Internal/external aids;Cueing hierarchy;Functional tasks  Daily Session Skilled Therapeutic Interventions: Skilled ST intervention performed with focus on language goals. Patient stated she was feeling anxious this morning because she wanted to speak with her family about getting staple(s) removed from her head, but difficulty with word finding "staple." SLP encouraged patient to write down as a reminder in her memory notebook for later reference. Patient was able to do so successfully with 100% accuracy. Facilitated following 2-step directions (first-then directions) with 100% accuracy without additional cues. Followed complex 2-step directions without cues with 66% accuracy, progressing to 83% accuracy with min-to-mod verbal cues and repetition (e.g., "after you point to the window, pick up the pen"). Followed 3-step directions with 20% accuracy without cues progressing to 60% accuracy with mod verbal cues. Facilitated reading comprehension at short paragraph level with 100% accuracy and supervision A verbal cues. Facilitated written expression at phrase level with min-to-mod A verbal cues. Patient was left in chair with alarm activated and needs with reach. Continue per ST POC.  General    Pain Pain Assessment Pain Scale: 0-10 Pain Score: 0-No pain Faces Pain Scale: Hurts a little bit  Therapy/Group: Individual  Therapy  Patty Sermons 06/16/2021, 4:13 PM

## 2021-06-16 NOTE — Progress Notes (Signed)
Physical Therapy Session Note  Patient Details  Name: Marissa Morris MRN: [id number] Date of Birth: 02/07/1961  Today's Date: 06/16/2021 PT Individual Time: 1130-1203 and 1516-1600 PT Individual Time Calculation (min): 33 min and 44 min   Short Term Goals: Week 2:  PT Short Term Goal 1 (Week 2): Pt will perform bed mobility with modA. PT Short Term Goal 2 (Week 2): Pt will perform bed to chair transfer with consistent Min A. PT Short Term Goal 3 (Week 2): Pt will perform sit to stand consistently with Mod A to RW. PT Short Term Goal 4 (Week 2): Pt will ambulate x25' with maxA and LRAD. PT Short Term Goal 5 (Week 2): Pt will initiate w/c mobility training.   Skilled Therapeutic Interventions/Progress Updates:  Session 1: Patient seated in w/c on entrance to room. Patient alert and agreeable to PT session. Patient relates that she recently had muscle cream applied but she is having pain at her R foot.   Therapeutic Activity: Pt's shoes removed and found insole of R shoe bunched at toe of shoe causing forefoot pain for pt. R air cast and Bil grip socks removed and replaced with footie socks for improved fit in shoes. Insole positioned and maintained in place during donning of R shoe. Pt relates improvement in fit with lingering pain in R forefoot. Ambulation postponed to afternoon session d/t foot pain.  Wheelchair Mobility:  Patient propelled wheelchair 100 feet with intermittent Min A for direction, improved RUE push. Provided vc/ tc throughout first 25% of bout for increasing RUE effort and decreasing LUE effort. Removed leg rests and instructed pt to use Bil heel strikes and reciprocating pulls along with RUE to propel chair with improved ability to maintain straight path. Requires consistent vc for maintaining R foot anterior to minimize foot pain as well as to promote good positioning and heel strike.  Patient seated in w/c at end of session with brakes locked, belt alarm set. Lotion and  STM applied to R forefoot for pain mgmt. Shoes doffed and feet placed on towels on leg rests for break from shoe pressure until next therapy session.   All needs within reach. Lap tray and tray table set up for pt to prep for lunch arrival.   Session 2:  Patient seated in w/c on entrance to room. Patient alert and agreeable to PT session. Patient relates improved pain at R foot with shoes donned.  Therapeutic Activity: Transfers: Patient performed STS transfers with CGA throughout session. VC/ tc for safe hand progression with push-to-stand technique to RW. SPVT transfers performed using RW and CGA/ Min A for balance. Provided verbal cues for technique. Toilet transfer requires Min/ Mod A at end of session d/t fatigue.   Gait Training:  Significant improvement seen since last session with this pt. Patient ambulated 37' x1/ 38' x1 using RW with CGA/ light Min A. Demonstrated improved RLE advancement and placement only requiring intermittent CGA with consistent vc throughout. Improved upright posture throughout both bouts.   Wheelchair Mobility:  Patient propelled wheelchair 100 feet using RUE and BLE requiring intermittent Min A to maintain straight path. Provided vc for technique and RUE effort to maintain path.  Patient seated  in w/c at end of session with brakes locked, belt alarm set, and all needs within reach.    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

## 2021-06-16 NOTE — Progress Notes (Signed)
Occupational Therapy Session Note  Patient Details  Name: Marissa Morris MRN: [id number] Date of Birth: [birth year]  Today's Date: 06/16/2021 OT Individual Time: 0093-8182 OT Individual Time Calculation (min): 69 min + 33 min   Short Term Goals: Week 3:  OT Short Term Goal 1 (Week 3): Pt will don pants with min A + AE PRN. OT Short Term Goal 2 (Week 3): Pt will complete 3/3 steps toileting tasks with min A. OT Short Term Goal 3 (Week 3): Pt will don B shoes with mod A.  Skilled Therapeutic Interventions/Progress Updates:    Session 1 [phone]): Pt received semi-reclined in bed, finishing breakfast, agreeable to therapy at earlier than scheduled time. Cont to c/o R sided "numbness". Session focus on self-care retraining + DME recs. Came to sitting EOB close S with use of bed rail. Squat-pivot to w/c with min A to pivot hips and min Vcs for technique. Doffed/donned shirt and bathed UB at sink with close S, bathed LB with overall min A for STS and to block R knee in standing. Pt demonstrates significantly improved static standing balance with no UE support. Stood to complete all LB bathing with light min A to CGA. Did req Vcs to not attempt to stand on one leg. Donned underwear/pants with overall min A to pull over R hip. Donned TEDS, socks, shoes, and aircast with overall min A to thread teds and don aircast/R shoe. Completed oral care distant S. Reviewed s/sx of stroke via BE FAST abbreviation, pt able to ind recall that "numbness, balance" are potential signs of stroke. Self-propelled w/c around nurses station in prep for improved functional mobility and BUE strengthening. Req overall mod to max A to maintain straight line in hallway despite consistent visual and verbal cues for even/large BUE strokes.   Pt left seated in w/c with safety belt alarm engaged, call bell in reach, and all immediate needs met.     Session 2 [phone]): Pt received seated in w/c, agreeable to therapy. Denies pain but  cont to c/o RUE numbness. Session focus on toileting and LUE NMR in prep for improved bimanual performance. Stand-pivot to and from w/c to toilet with min A to progress RLE. Utilized grab bar throughout transfer for balance and for STS. Mod A for LB clothing management and min A for posterior pericare thoroughness post continent void of b/b. Self-propelled w/c to and from gym with mod A to propel and for doorway/obstacle navigation. Min carryover for BUE technique despite cuing. Completed 1x10 of R shoulder flexion + elbow extension with use of ball and min physical assist for technique. Tossed and caught ball with use of BUE for period of 2 min without fatigue. Demonstrates improved BUE gross coordination to catch ball with 95% accuracy. Pt able to ind recall 2 activities of session to write in memory notebook.   Pt left seated in w/c with safety alarm engaged, call bell in reach, and all immediate needs met.    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 Therapy/Group: Individual Therapy  Volanda Napoleon MS, OTR/L  06/16/2021, 6:37 AM

## 2021-06-16 NOTE — Progress Notes (Signed)
PROGRESS NOTE   Subjective/Complaints:  No issues overnite , feels good this am , more aware of numbness, some pain in RIght forearm but not in face or leg today   ROS: Patient denies CP, SOB, N/V/D   Objective:   No results found. No results for input(s): WBC, HGB, HCT, PLT in the last 72 hours.  No results for input(s): NA, K, CL, CO2, GLUCOSE, BUN, CREATININE, CALCIUM in the last 72 hours.   Intake/Output Summary (Last 24 hours) at 06/16/2021 0749 Last data filed at 06/15/2021 1700 Gross per 24 hour  Intake 480 ml  Output --  Net 480 ml         Physical Exam: Vital Signs Blood pressure 127/80, pulse (!) 59, temperature 97.9 F (36.6 C), resp. rate 14, height 5\' 3"  (1.6 m), weight 73.2 kg, SpO2 95 %.    General: No acute distress Mood and affect are appropriate Heart: Regular rate and rhythm no rubs murmurs or extra sounds Lungs: Clear to auscultation, breathing unlabored, no rales or wheezes Abdomen: Positive bowel sounds, soft nontender to palpation, nondistended Extremities: No clubbing, cyanosis, or edema Skin: No evidence of breakdown, no evidence of rash   Neurologic: Cranial nerves II through XII intact, motor strength is 5/5 in left and 3- right deltoid, bicep, tricep, grip, hip flexor, knee extensors, ankle dorsiflexor and plantar flexor Sensation reduced  LT RUE and RLE, reduced R side of face  Musculoskeletal: minimal knee pain with PROM, no effusion  Assessment/Plan: 1. Functional deficits which require 3+ hours per day of interdisciplinary therapy in a comprehensive inpatient rehab setting. Physiatrist is providing close team supervision and 24 hour management of active medical problems listed below. Physiatrist and rehab team continue to assess barriers to discharge/monitor patient progress toward functional and medical goals  Care Tool:  Bathing    Body parts bathed by patient: Right arm,  Left arm, Chest, Abdomen, Face   Body parts bathed by helper: Front perineal area, Buttocks, Right upper leg, Right lower leg, Left lower leg     Bathing assist Assist Level: Supervision/Verbal cueing     Upper Body Dressing/Undressing Upper body dressing   What is the patient wearing?: Pull over shirt    Upper body assist Assist Level: Set up assist    Lower Body Dressing/Undressing Lower body dressing      What is the patient wearing?: Pants, Incontinence brief     Lower body assist Assist for lower body dressing: Minimal Assistance - Patient > 75%     Toileting Toileting Toileting Activity did not occur (Clothing management and hygiene only): N/A (no void or bm)  Toileting assist Assist for toileting: Moderate Assistance - Patient 50 - 74%     Transfers Chair/bed transfer  Transfers assist     Chair/bed transfer assist level: Maximal Assistance - Patient 25 - 49%     Locomotion Ambulation   Ambulation assist      Assist level: Maximal Assistance - Patient 25 - 49% Assistive device: Parallel bars Max distance: 5   Walk 10 feet activity   Assist  Walk 10 feet activity did not occur: Safety/medical concerns  Assist level: Maximal Assistance -  Patient 25 - 49% Assistive device: Walker-rolling   Walk 50 feet activity   Assist Walk 50 feet with 2 turns activity did not occur: Safety/medical concerns         Walk 150 feet activity   Assist Walk 150 feet activity did not occur: Safety/medical concerns         Walk 10 feet on uneven surface  activity   Assist Walk 10 feet on uneven surfaces activity did not occur: Safety/medical concerns         Wheelchair     Assist Will patient use wheelchair at discharge?: No Type of Wheelchair: Manual    Wheelchair assist level: Minimal Assistance - Patient > 75% Max wheelchair distance: 25    Wheelchair 50 feet with 2 turns activity    Assist        Assist Level: Minimal  Assistance - Patient > 75%   Wheelchair 150 feet activity     Assist          Blood pressure 127/80, pulse (!) 59, temperature 97.9 F (36.6 C), resp. rate 14, height 5\' 3"  (1.6 m), weight 73.2 kg, SpO2 95 %.   Medical Problem List and Plan: 1.  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  Status post right frontal ventriculostomy for hydrocephalus             -patient may  shower             -ELOS/Goals: 7/14 cont CIR PT, OT, SLP   - 2.  Impaired mobility -DVT/anticoagulation: Continue Subcutaneous heparin initiated 05/18/2021             -antiplatelet therapy: N/A 3. 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 Mood: Provigil 100 mg daily             -antipsychotic agents: N/A 5. Neuropsych: This patient is capable of making decisions on her own behalf. 6. Skin/Wound Care: Routine skin checks 7. Fluids/Electrolytes/Nutrition: Routine in and outs with follow-up chemistries 8.  Seizure prophylaxis.  Keppra completed 9.  Hypertension.  Continue Norvasc 10 mg daily, hydralazine 20 mg every 8 hours, lisinopril 40 mg daily, labetalol 200 mg 3 times daily.  Monitor with increased mobility Vitals:   06/15/21 1934 06/16/21 0511  BP: 129/77 127/80  Pulse: 71 (!) 59  Resp: 14 14  Temp: 98.2 F (36.8 C) 97.9 F (36.6 C)  SpO2: 99% 95%  controlled 7/5 10.  Hyperglycemia.  Hemoglobin A1c 5.7.  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   7/3 will increase HS dose to 600mg    LOS: 17 days A FACE TO FACE EVALUATION WAS PERFORMED  08/17/21 06/16/2021, 7:49 AM

## 2021-06-17 LAB — GLUCOSE, CAPILLARY
Glucose-Capillary: 82 mg/dL (ref 70–99)
Glucose-Capillary: 118 mg/dL — ABNORMAL HIGH (ref 70–99)

## 2021-06-17 NOTE — Progress Notes (Signed)
PROGRESS NOTE   Subjective/Complaints:  No issues overnite , felt woozy about 2 h after breakfast and this improved after drinking juice   ROS: Patient denies CP, SOB, N/V/D   Objective:   No results found. No results for input(s): WBC, HGB, HCT, PLT in the last 72 hours.  No results for input(s): NA, K, CL, CO2, GLUCOSE, BUN, CREATININE, CALCIUM in the last 72 hours.   Intake/Output Summary (Last 24 hours) at 06/17/2021 0930 Last data filed at 06/17/2021 0736 Gross per 24 hour  Intake 1080 ml  Output --  Net 1080 ml         Physical Exam: Vital Signs Blood pressure 136/79, pulse 64, temperature 97.9 F (36.6 C), temperature source Oral, resp. rate 18, height _0  (1.6 m), weight 73.2 kg, SpO2 100 %.  General: No acute distress Mood and affect are appropriate Heart: Regular rate and rhythm no rubs murmurs or extra sounds Lungs: Clear to auscultation, breathing unlabored, no rales or wheezes Abdomen: Positive bowel sounds, soft nontender to palpation, nondistended Extremities: No clubbing, cyanosis, or edema Skin: No evidence of breakdown, no evidence of rash Neurologic: Cranial nerves II through XII intact, motor strength is 5/5 in left and 3- right deltoid, bicep, tricep, grip, hip flexor, knee extensors, ankle dorsiflexor and plantar flexor Sensation reduced  LT RUE and RLE, reduced R side of face  Musculoskeletal: no knee pain with PROM, no effusion  Assessment/Plan: 1. Functional deficits which require 3+ hours per day of interdisciplinary therapy in a comprehensive inpatient rehab setting. Physiatrist is providing close team supervision and 24 hour management of active medical problems listed below. Physiatrist and rehab team continue to assess barriers to discharge/monitor patient progress toward functional and medical goals  Care Tool:  Bathing    Body parts bathed by patient: Right arm, Left arm,  Chest, Abdomen, Face, Front perineal area, Buttocks, Right upper leg, Left upper leg, Right lower leg, Left lower leg   Body parts bathed by helper: Front perineal area, Buttocks, Right upper leg, Right lower leg, Left lower leg     Bathing assist Assist Level: Minimal Assistance - Patient > 75%     Upper Body Dressing/Undressing Upper body dressing   What is the patient wearing?: Pull over shirt    Upper body assist Assist Level: Supervision/Verbal cueing    Lower Body Dressing/Undressing Lower body dressing      What is the patient wearing?: Pants, Incontinence brief     Lower body assist Assist for lower body dressing: Minimal Assistance - Patient > 75%     Toileting Toileting Toileting Activity did not occur (Clothing management and hygiene only): N/A (no void or bm)  Toileting assist Assist for toileting: Moderate Assistance - Patient 50 - 74%     Transfers Chair/bed transfer  Transfers assist     Chair/bed transfer assist level: Maximal Assistance - Patient 25 - 49%     Locomotion Ambulation   Ambulation assist      Assist level: Maximal Assistance - Patient 25 - 49% Assistive device: Parallel bars Max distance: 5   Walk 10 feet activity   Assist  Walk 10 feet activity did not  occur: Safety/medical concerns  Assist level: Maximal Assistance - Patient 25 - 49% Assistive device: Walker-rolling   Walk 50 feet activity   Assist Walk 50 feet with 2 turns activity did not occur: Safety/medical concerns         Walk 150 feet activity   Assist Walk 150 feet activity did not occur: Safety/medical concerns         Walk 10 feet on uneven surface  activity   Assist Walk 10 feet on uneven surfaces activity did not occur: Safety/medical concerns         Wheelchair     Assist Will patient use wheelchair at discharge?: No Type of Wheelchair: Manual    Wheelchair assist level: Minimal Assistance - Patient > 75% Max wheelchair  distance: 25    Wheelchair 50 feet with 2 turns activity    Assist        Assist Level: Minimal Assistance - Patient > 75%   Wheelchair 150 feet activity     Assist          Blood pressure 136/79, pulse 64, temperature 97.9 F (36.6 C), temperature source Oral, resp. rate 18, height _0  (1.6 m), weight 73.2 kg, SpO2 100 %.   Medical Problem List and Plan: 1.  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  Status post right frontal ventriculostomy for hydrocephalus             -patient may  shower             -ELOS/Goals: 7/14 cont CIR PT, OT, SLP   -Team conference today please see physician documentation under team conference tab, met with team  to discuss problems,progress, and goals. Formulized individual treatment plan based on medical history, underlying problem and comorbidities.  2.  Impaired mobility -DVT/anticoagulation: Continue Subcutaneous heparin initiated 05/18/2021             -antiplatelet therapy: N/A 3. 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 Mood: Provigil 100 mg daily             -antipsychotic agents: N/A 5. Neuropsych: This patient is capable of making decisions on her own behalf. 6. Skin/Wound Care: Routine skin checks 7. Fluids/Electrolytes/Nutrition: Routine in and outs with follow-up chemistries 8.  Seizure prophylaxis.  Keppra completed 9.  Hypertension.  Continue Norvasc 10 mg daily, hydralazine 20 mg every 8 hours, lisinopril 40 mg daily, labetalol 200 mg 3 times daily.  Monitor with increased mobility Vitals:   06/16/21 2005 06/17/21 0432  BP: 134/68 136/79  Pulse: 70 64  Resp: 17 18  Temp: 98.6 F (37 C) 97.9 F (36.6 C)   SpO2: 97% 100%  controlled 7/6 10.  Hyperglycemia.  Hemoglobin A1c 5.7.  SSI Will order CBG BID due to symptoms of "feeling woozy"  11.  Thalamic pain syndrome increased gabapentin to 336m TID - pt is taking.   7/3 will increase HS dose to 6065m  LOS: 18 days A FACE TO FAPeridot Jovian Lembcke 06/17/2021, 9:30 AM

## 2021-06-17 NOTE — Patient Care Conference (Signed)
Inpatient RehabilitationTeam Conference and Plan of Care Update Date: 06/17/2021   Time: 10:02 AM    Patient Name: Marissa Morris      Medical Record Number: [id number]  Date of Birth: Sep 28, 1961 Sex: Female         Room/Bed: 4W04C/4W04C-01 Payor Info: Payor: BLUE CROSS BLUE [employer]IELD / Plan: BCBS OTHER / Product Type: *No Product type* /    Admit Date/Time:  05/30/2021  4:15 PM  Primary Diagnosis:  Thalamic hemorrhage [facility])  Hospital Problems: Principal Problem:   Thalamic hemorrhage [facility])    Expected Discharge Date: Expected Discharge Date: 06/25/21  Team Members Present: Physician leading conference: Dr. Claudette Laws Care Coodinator Present: Chana Bode, RN, BSN, CRRN;Christina [place], BSW Nurse Present: Chana Bode, RN PT Present: Casimiro Needle, PT OT Present: Other (comment) Annye English, OT) SLP Present: Other (comment) Dalbert [place], SLP) PPS Coordinator present : Fae Pippin, SLP     Current Status/Progress Goal Weekly Team Focus  Bowel/Bladder   Pt is continenet of bowel/bladder  Pt will remain continent of bowel/bladder  Will assess qshift and PRN   Swallow/Nutrition/ Hydration             ADL's   Set-up to S UBD, UBB, grooming; min to mod A LBD, toileting, toilet transfer via squat-pivot; RUE Brummstrom level III to IV; hand V  S for grooming, min A for ADL/transfers  sit <> stands when able, sitting balance/core, ADL retraining, RUE NMR, hemitechniques, pt/family education   Mobility   Demonstrating good progress over the past week - Bed mobility = CGA/ Min A; transfers = MinA up to Mod A for SPVT when not standing upright or when fatigued, gait = Min A with RW for up to 40 feet  Bed mobility = SUP; Transfers = CGA/Min A; Gait = CGA/Min A for at least 74ft with LRAD  NMR for R hemibody motor control, improving LOA required for bed mobility, transfers, and gait, improving standing tolerance/ balance, improving w/c mobility, continued pain  management, initiating stair training   Communication   sup-to-min A for word finding at conversational level  Sup A  use of word finding strategies at conversational level, comprehension of complex information, written expression   Safety/Cognition/ Behavioral Observations  Min-to-mod A  Min A  problem solving, sustained attention, short-term recall and use of memory notebook and compensatory strategies   Pain   Pt reports pain on a scale of 0-10 at an 8  Pt pain level will decrease to 0  Will assess qshift and PRN   Skin   Patient's skin is intact  Patient's skin will remain intact  Will assess qshift and PRN     Discharge Planning:  Patient discharging home with sister and Shanda Bumps to assist 24/7   Team Discussion: Neurogenic pain improved. Episodes of hypoglycemia vs fatigue; reports "wooziness". MD to order CBG checks to confirm cause. Overall; doing well and on track to meet supervison - min assist goals set for discharge. Patient on target to meet rehab goals: yes, currently min - mod assist for lower body care and stand pivot with min - mod assist. Able to ambulate 19' with a RW and min assist. Supervision - min assist for SLP; min - mod assist for problem solving and recall , and sustained attention.  *See Care Plan and progress notes for long and short-term goals.   Revisions to Treatment Plan:    Teaching Needs: Safety, supervision needs, medications, secondary stroke risk management, etc  Current Barriers to  Discharge: Decreased caregiver support and Home enviroment access/layout  Possible Resolutions to Barriers: Family education with sister     Medical Summary Current Status: Right knee pain improved, s/p injection, Neurogenic pain RUE and RLE  Barriers to Discharge: Medical stability   Possible Resolutions to Barriers/Weekly Focus: Check CBG TID to eval episodes of dizziness   Continued Need for Acute Rehabilitation Level of Care: The patient requires daily  medical management by a physician with specialized training in physical medicine and rehabilitation for the following reasons: Direction of a multidisciplinary physical rehabilitation program to maximize functional independence : Yes Medical management of patient stability for increased activity during participation in an intensive rehabilitation regime.: Yes Analysis of laboratory values and/or radiology reports with any subsequent need for medication adjustment and/or medical intervention. : Yes   I attest that I was present, lead the team conference, and concur with the assessment and plan of the team.   Chana Bode B 06/17/2021, 11:22 AM

## 2021-06-17 NOTE — Progress Notes (Signed)
Physical Therapy Weekly Progress Note  Patient Details  Name: Marissa Morris MRN: [id number] Date of Birth: 1961/01/25  Beginning of progress report period: June 08, 2021 End of progress report period: June 17, 2021  Today's Date: 06/17/2021 PT Individual Time: 1030-1130 PT Individual Time Calculation (min): 60 min   Patient has met 5 of 5 short term goals.  Patient progressing well and able to demonstrate improved transfers min to CGA for sit to stand with cues for R foot position and bed <>chair with min A stand pivot with RW and cues.  Patient able to ambulate with RW x 40' with R ankle brace and mod A with mod cues for forward gaze, posture R foot clearance and R LE activation in stance.  Able to propel w/c with occasional min A and mod cues for R side awareness x 100'.  Patient continues to demonstrate the following deficits impaired timing and sequencing, motor apraxia, and decreased coordination, decreased awareness, decreased problem solving, decreased safety awareness, and decreased memory, and decreased standing balance, decreased postural control, and hemiplegia and therefore will continue to benefit from skilled PT intervention to increase functional independence with mobility.  Patient progressing toward long term goals..  Continue plan of care.  PT Short Term Goals Week 2:  PT Short Term Goal 1 (Week 2): Pt will perform bed mobility with modA. PT Short Term Goal 1 - Progress (Week 2): Met PT Short Term Goal 2 (Week 2): Pt will perform bed to chair transfer with consistent Min A. PT Short Term Goal 2 - Progress (Week 2): Met PT Short Term Goal 3 (Week 2): Pt will perform sit to stand consistently with Mod A to RW. PT Short Term Goal 3 - Progress (Week 2): Met PT Short Term Goal 4 (Week 2): Pt will ambulate x25' with maxA and LRAD. PT Short Term Goal 4 - Progress (Week 2): Met PT Short Term Goal 5 (Week 2): Pt will initiate w/c mobility training. PT Short Term Goal 5 - Progress  (Week 2): Met Week 3:  PT Short Term Goal 1 (Week 3): STG=LTG due to ELOS  Skilled Therapeutic Interventions/Progress Updates:  Patient in w/c in room requesting to toilet.  States has continued to have episodes of "going in and out of it" and says MD reports may be due to blood sugar fluctuations.  Reports has had some juice and feels better.  Patient sit to stand to RW with cues for R foot position and CGA.  Ambulated to bathroom with RW and mod A with mod cues for negotiating lip on threshold of bathroom and assist for walker management.  Patient completed toileting with min A for standing balance as pt pulled up pants.  Ambulated to sink to wash hands demonstrating poor safety awareness with set up leaning over RW to reach handles and wash hands despite cues.  Patient assisted to w/c with min A and cues.  Patient attempted w/c mobility despite c/o pain in R forearm and still with lap tray on w/c.  Cued to rest her arm and PT pushed pt to therapy gym.    Demonstrated curb negotiation with RW and pt performed x 2 with mod A for walker management, balance and max cues for sequencing, foot placement. Trial of R AFO for knee support and ankle DF.  Patient ambulated 58' with mod A with RW and cues for posture/forward gaze, but R foot catching and difficulty with getting off toe as left heel lift in shoe.  Removed  heel lift and ambulated another 78' with some improvement in getting foot flat, still catching toe more than half the time.  Placed sock on toe to simulate toe cap and pt ambulated another 46' with improved foot clearance, but two episodes catching toe with significant LOB and mod A to recover.  Discussed with pt limited benefit at this time and replaced ankle splint and heel lift in R shoe.  Patient assisted to room in w/c and left with NT in the room to assist to bathroom.   Ambulation/gait training;Community reintegration;DME/adaptive equipment instruction;Neuromuscular re-education;Psychosocial  support;Wheelchair propulsion/positioning;Stair training;UE/LE Strength taining/ROM;Balance/vestibular training;Discharge planning;Functional electrical stimulation;Pain management;Skin care/wound management;Therapeutic Activities;UE/LE Coordination activities;Cognitive remediation/compensation;Disease management/prevention;Functional mobility training;Patient/family education;Splinting/orthotics;Therapeutic Exercise;Visual/perceptual remediation/compensation   Therapy Documentation Precautions:  Precautions Precautions: Fall Precaution Comments: Rt hemiparesis with tone in UE/LE Restrictions Weight Bearing Restrictions: No   Pain: Pain Assessment Faces Pain Scale: Hurts little more Pain Type: Acute pain Pain Location: Arm Pain Orientation: Right Pain Descriptors / Indicators: Aching Pain Onset: On-going Pain Intervention(s): Repositioned;Distraction   Therapy/Group: Individual Therapy  Reginia Naas Magda Kiel, PT 06/17/2021, 12:16 PM

## 2021-06-17 NOTE — Progress Notes (Signed)
Speech Language Pathology Daily Session Note  Patient Details  Name: [name] MRN: [id number] Date of Birth: 26-Nov-1961  Today's Date: 06/17/2021 SLP Individual Time: 0730-0800 SLP Individual Time Calculation (min): 30 min  Short Term Goals: Week 3: SLP Short Term Goal 1 (Week 3): STGs=LTGs due to ELOS  Skilled Therapeutic Interventions: Skilled ST intervention performed with focus on cognitive goals. Patient was oriented to date with use of calendar on wall. She requested for ST to write pertinent details on calendar that occurred yesterday. Staff has been assisting with writing important details and events relating to therapy and health status on a daily basis since the start of July per patient request. Patient is demonstrating good carry over of memory strategies and external aids. Facilitated verbal reasoning task with min A verbal cues for processing and problem solving. Patient was left with NT at end of session. Continue per ST POC.     Pain Pain Assessment Pain Scale: 0-10 Pain Score: 5  Pain Type: Acute pain Pain Location: Knee Pain Orientation: Left;Right Pain Intervention(s): Medication (See eMAR)  Therapy/Group: Individual Therapy  Tamala Ser 06/17/2021, 10:58 AM

## 2021-06-17 NOTE — Progress Notes (Signed)
Occupational Therapy Session Note  Patient Details  Name: Marissa Morris MRN: [id number] Date of Birth: Sep 29, 1961  Today's Date: 06/17/2021 OT Individual Time: 2836-6294 OT Individual Time Calculation (min): 18 min  and Today's Date: 06/17/2021 OT Group Time: 1330-1430 OT Group Time Calculation (min): 60 min   Short Term Goals: Week 3:  OT Short Term Goal 1 (Week 3): Pt will don pants with min A + AE PRN. OT Short Term Goal 2 (Week 3): Pt will complete 3/3 steps toileting tasks with min A. OT Short Term Goal 3 (Week 3): Pt will don B shoes with mod A.  Skilled Therapeutic Interventions/Progress Updates:   Group session: Pt was seen for skilled group session with focus of group session on  social engagement, dynamic reaching from w/c level, w/c mobility in/around tight spaces, and BUE strength and coordination.  Pt reported moderate pain in RUE during therapeutic activity of engaging in bowling activity, provided rest breaks and education on positioning and stretching. Pt returned to room with total A for time mgmt, pt left up in recliner with alarm belt activated and all needs within reach.  Self care: Upon arrival and at end of group session pt reports need to void bladder. Assisted pt to toilet via stedy with pt able to stand to stedy with CGA,  cues for safety awareness. Pt transported to toilet with total A in stedy, MIN A for clothing mgmt during toileting.     Therapy Documentation Precautions:  Precautions Precautions: Fall Precaution Comments: Rt hemiparesis with tone in UE/LE Restrictions Weight Bearing Restrictions: No    Therapy/Group: Individual Therapy and Group Therapy  Pollyann Glen [facility] 06/17/2021, 3:14 PM

## 2021-06-17 NOTE — Progress Notes (Signed)
Nutrition Follow-up  DOCUMENTATION CODES:   Not applicable  INTERVENTION:  Continue Ensure Enlive po BID, each supplement provides 350 kcal and 20 grams of protein   Encourage adequate PO intake.   NUTRITION DIAGNOSIS:   Increased nutrient needs related to  (therapy) as evidenced by estimated needs; ongoing  GOAL:   Patient will meet greater than or equal to 90% of their needs; progressing  MONITOR:   PO intake, Supplement acceptance, Skin, Weight trends, Labs, I & O's  REASON FOR ASSESSMENT:   Malnutrition Screening Tool    ASSESSMENT:   60 year old female with history of hypertension presenting with right side weakness nausea vomiting facial droop with dysarthria. 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 Pt underwent right frontal ventriculostomy for hydrocephalus. Due to patient's right side weakness and dysarthria admitted to CIR.  Meal completion has been 50-100%. Pt currently has Ensure ordered and has been consuming them. RD to continue with current orders to aid in caloric and protein needs. Labs and medications reviewed.   Diet Order:   Diet Order             Diet Heart Room service appropriate? Yes; Fluid consistency: Thin  Diet effective now                   EDUCATION NEEDS:   Not appropriate for education at this time  Skin:  Skin Assessment: Reviewed RN Assessment  Last BM:  7/5  Height:   Ht Readings from Last 1 Encounters:  05/30/21 5\' 3"  (1.6 m)    Weight:   Wt Readings from Last 1 Encounters:  06/06/21 73.2 kg   BMI:  Body mass index is 28.59 kg/m.  Estimated Nutritional Needs:   Kcal:  1800-2000  Protein:  90-100 grams  Fluid:  >/= 1.8 L/day  06/08/21, MS, RD, LDN RD pager number/after hours weekend pager number on Amion.

## 2021-06-17 NOTE — Progress Notes (Signed)
Patient ID: Marissa Morris, female   DOB: [birth year], 60 y.o.   MRN: [id number]  Sw made attempt to schedule family education, sister reports she will call SW back.  [place], [place] [phone]

## 2021-06-17 NOTE — Progress Notes (Signed)
Patient ID: Marissa Morris, female   DOB: 1961-12-11, 60 y.o.   MRN: [id number] Team Conference Report to Patient/Family  Team Conference discussion was reviewed with the patient and caregiver, including goals, any changes in plan of care and target discharge date.  Patient and caregiver express understanding and are in agreement.  The patient has a target discharge date of 06/25/21.  SW met with pt, called sister at bedside. Left VM for callback. Pt reports knee pain improving, but still experiencing weakness and numbness, concerned about d/c next week. No additional questions or concerns. Sw will cont to follow up  [name] 06/17/2021, 1:41 PM

## 2021-06-17 NOTE — Progress Notes (Signed)
Occupational Therapy Session Note  Patient Details  Name: Marissa Morris MRN: [id number] Date of Birth: Oct 05, 1961  Today's Date: 06/17/2021 OT Individual Time: 5366-4403 OT Individual Time Calculation (min): 54 min    Short Term Goals: Week 3:  OT Short Term Goal 1 (Week 3): Pt will don pants with min A + AE PRN. OT Short Term Goal 2 (Week 3): Pt will complete 3/3 steps toileting tasks with min A. OT Short Term Goal 3 (Week 3): Pt will don B shoes with mod A.  Skilled Therapeutic Interventions/Progress Updates:    Pt received sitting EOB with NT present, cont to c/o RUE numbness/pain, agreeable to therapy. Session focus on self-care retraining and RUE NMR in prep for improved bimanual ADL performance. Squat-pivot to w/c with CGA and mod Vcs for safety as pt attempts to transfer prior to removing arm rest. Completed UBD and UBB with set-up A at sink. LBD and LBB with min A for balance during STS. Oral care with set-up at sink. MD in /out for assessment. Self-propelled w/c to and from gym with overall mod A and mod Vcs for technique with minimal carryover, primarily 2/2 poor RUE sensation. Completed 1x15 of R shoulder flexion/elbow extension with use of weighted incline with good technique and no trunk compensation.   Pt left seated in w/c with safety belt alarm engaged, call bell in reach, and all immediate needs met.    Therapy Documentation Precautions:  Precautions Precautions: Fall Precaution Comments: Rt hemiparesis with tone in UE/LE Restrictions Weight Bearing Restrictions: No  Pain:  RUE numbness ADL: See Care Tool for more details.   Therapy/Group: Individual Therapy  Volanda Napoleon MS, OTR/L  06/17/2021, 6:34 AM

## 2021-06-18 LAB — GLUCOSE, CAPILLARY
Glucose-Capillary: 96 mg/dL (ref 70–99)
Glucose-Capillary: 93 mg/dL (ref 70–99)
Glucose-Capillary: 101 mg/dL — ABNORMAL HIGH (ref 70–99)

## 2021-06-18 NOTE — Progress Notes (Signed)
Occupational Therapy Session Note  Patient Details  Name: Marissa Morris MRN: [id number] Date of Birth: 12/16/1960  Today's Date: 06/18/2021 OT Individual Time: 0704-0800 OT Individual Time Calculation (min): 56 min    Short Term Goals: Week 3:  OT Short Term Goal 1 (Week 3): Pt will don pants with min A + AE PRN. OT Short Term Goal 2 (Week 3): Pt will complete 3/3 steps toileting tasks with min A. OT Short Term Goal 3 (Week 3): Pt will don B shoes with mod A.  Skilled Therapeutic Interventions/Progress Updates:    Pt received semi-reclined in bed finishing breakfast, agreeable to therapy. Session focus on self-care retraining. Pt initially tearful, states she is worried about "how she is going to care for herself" upon DC. Therapeutic use of self utilized to remind pt of rehab progress, potential, and current support systems. Pt with improved affect and agreeable to shower. Came to sitting EOB close S. Stedy transfer > toilet > shower 2/2 time management and pt cont complaints of RLE/RUE pain. Total A for brief management, cont void of bladder, close S for seated pericare. Pt completes UBD close S and with increased time. Bathes full body with close S this date, able to reach periarea via cutout in 3in1 in shower. Cont to demonstrate good functional use of RUE, decreased dropping of wash cloth this date compared to previous sessions. Donned new brief + pants with light min A to pull brief over R hip. Max A to don B teds, shoes, and socks 2/2 time management. Completed oral care with set-up A.  Pt left seated in w/c with safety belt alarm engaged, call bell in reach, and all immediate needs met.    Therapy Documentation Precautions:  Precautions Precautions: Fall Precaution Comments: Rt hemiparesis with tone in UE/LE Restrictions Weight Bearing Restrictions: No  Pain: see session note   ADL: See Care Tool for more details.   Therapy/Group: Individual Therapy  Volanda Napoleon MS,  OTR/L  06/18/2021, 6:42 AM

## 2021-06-18 NOTE — Progress Notes (Signed)
Speech Language Pathology Daily Session Note  Patient Details  Name: Marissa Morris MRN: [id number] Date of Birth: [birth year]  Today's Date: 06/18/2021 SLP Individual Time: 1450-1535 SLP Individual Time Calculation (min): 45 min  Short Term Goals: Week 3: SLP Short Term Goal 1 (Week 3): STGs=LTGs due to ELOS  Skilled Therapeutic Interventions: Patient was agreeable to ST intervention with focus on cognitive goals. As patient discussed her upcoming discharge, she expressed interest in better understanding her medications and being able to contribute to managing medications at discharge with the support of her sister. SLP wrote down patient's medications on medication chart and patient was able to provide purpose of at least 5/8 medications independently. Also discussed frequency and dosage. Patient indicated interest in using a weekly pillbox and identified that 3x/day pillbox (morning, afternoon, evening) would be most beneficial based on the frequency of current medications. Patient acknowledged that medications are subject to change prior to discharge and that medication chart is being used for practice purposes only. At the end of session patient initiated use of her memory notebook and recorded what was addressed during today's session. Patient was left in wheelchair with needs within reach and alarm activated at end of session. Continue per ST plan of care and plan for pillbox organization during following session.    Pain Pain Assessment Pain Scale: 0-10 Pain Score: 0-No pain  Therapy/Group: Individual Therapy  Tamala Ser 06/18/2021, 4:34 PM

## 2021-06-18 NOTE — Progress Notes (Signed)
Occupational Therapy Session Note  Patient Details  Name: Kelty Szafran MRN: [id number] Date of Birth: 30-Apr-1961  Today's Date: 06/18/2021 OT Individual Time: 2761-4709 OT Individual Time Calculation (min): 40 min      Skilled Therapeutic Interventions/Progress Updates:    Patient in room - in process of heading to the bathroom to complete toileting.  Utilized stedy for w/c to toilet.  She is able to stand in stedy from both surfaces with CS.  Toileting completed with min A.  She completes hand hygiene with CS.   Completed standing weight shift, stepping, forward lean onto both arms for modified push ups, reaching and trunk control activities with CGA and cues for right leg stability/knee control + weight shift.    Provided various size markers and highlighters for handwriting and dexterity activities.  She remained seated in w/c at close of session with seat belt alarm set and call bell in reach.    Therapy Documentation Precautions:  Precautions Precautions: Fall Precaution Comments: Rt hemiparesis with tone in UE/LE Restrictions Weight Bearing Restrictions: No   Therapy/Group: Individual Therapy  Barrie Lyme 06/18/2021, 10:45 AM

## 2021-06-18 NOTE — Progress Notes (Signed)
Patient ID: Marissa Morris, female   DOB: [birth year], 60 y.o.   MRN: [id number]  Family education scheduled Monday, 7/11. 1-4 PM  Lavera Guise, [place] [phone]

## 2021-06-18 NOTE — Progress Notes (Signed)
PROGRESS NOTE   Subjective/Complaints:  Discussed normal CBGs, also discussed med side effects which may include feeling dizzy  ROS: Patient denies CP, SOB, N/V/D   Objective:   No results found. No results for input(s): WBC, HGB, HCT, PLT in the last 72 hours.  No results for input(s): NA, K, CL, CO2, GLUCOSE, BUN, CREATININE, CALCIUM in the last 72 hours.   Intake/Output Summary (Last 24 hours) at 06/18/2021 0812 Last data filed at 06/17/2021 1843 Gross per 24 hour  Intake 480 ml  Output --  Net 480 ml         Physical Exam: Vital Signs Blood pressure 137/88, pulse 66, temperature 98.4 F (36.9 C), resp. rate 16, height 5\' 3"  (1.6 m), weight 73.2 kg, SpO2 100 %.   General: No acute distress Mood and affect are appropriate Heart: Regular rate and rhythm no rubs murmurs or extra sounds Lungs: Clear to auscultation, breathing unlabored, no rales or wheezes Abdomen: Positive bowel sounds, soft nontender to palpation, nondistended Extremities: No clubbing, cyanosis, or edema Skin: No evidence of breakdown, no evidence of rash  Neurologic: Cranial nerves II through XII intact, motor strength is 5/5 in left and 3- right deltoid, bicep, tricep, grip, hip flexor, knee extensors, ankle dorsiflexor and plantar flexor Sensation reduced  LT RUE and RLE, reduced R side of face  Musculoskeletal: no knee pain with PROM, no effusion  Assessment/Plan: 1. Functional deficits which require 3+ hours per day of interdisciplinary therapy in a comprehensive inpatient rehab setting. Physiatrist is providing close team supervision and 24 hour management of active medical problems listed below. Physiatrist and rehab team continue to assess barriers to discharge/monitor patient progress toward functional and medical goals  Care Tool:  Bathing    Body parts bathed by patient: Right arm, Left arm, Chest, Abdomen, Face, Front perineal  area, Buttocks, Right upper leg, Left upper leg, Right lower leg, Left lower leg   Body parts bathed by helper: Front perineal area, Buttocks, Right upper leg, Right lower leg, Left lower leg     Bathing assist Assist Level: Minimal Assistance - Patient > 75%     Upper Body Dressing/Undressing Upper body dressing   What is the patient wearing?: Pull over shirt    Upper body assist Assist Level: Supervision/Verbal cueing    Lower Body Dressing/Undressing Lower body dressing      What is the patient wearing?: Pants     Lower body assist Assist for lower body dressing: Minimal Assistance - Patient > 75%     Toileting Toileting Toileting Activity did not occur (Clothing management and hygiene only): N/A (no void or bm)  Toileting assist Assist for toileting: Minimal Assistance - Patient > 75%     Transfers Chair/bed transfer  Transfers assist     Chair/bed transfer assist level: Minimal Assistance - Patient > 75%     Locomotion Ambulation   Ambulation assist      Assist level: Moderate Assistance - Patient 50 - 74% Assistive device: Walker-rolling (R air cast) Max distance: 40'   Walk 10 feet activity   Assist  Walk 10 feet activity did not occur: Safety/medical concerns  Assist level: Moderate Assistance -  Patient - 50 - 74% Assistive device: Walker-rolling, Orthosis   Walk 50 feet activity   Assist Walk 50 feet with 2 turns activity did not occur: Safety/medical concerns         Walk 150 feet activity   Assist Walk 150 feet activity did not occur: Safety/medical concerns         Walk 10 feet on uneven surface  activity   Assist Walk 10 feet on uneven surfaces activity did not occur: Safety/medical concerns         Wheelchair     Assist Will patient use wheelchair at discharge?: No Type of Wheelchair: Manual    Wheelchair assist level: Minimal Assistance - Patient > 75% Max wheelchair distance: 25    Wheelchair 50 feet  with 2 turns activity    Assist        Assist Level: Minimal Assistance - Patient > 75%   Wheelchair 150 feet activity     Assist          Blood pressure 137/88, pulse 66, temperature 98.4 F (36.9 C), resp. rate 16, height 5\' 3"  (1.6 m), weight 73.2 kg, SpO2 100 %.   Medical Problem List and Plan: 1.  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  Status post right frontal ventriculostomy for hydrocephalus             -patient may  shower             -ELOS/Goals: 7/14 cont CIR PT, OT, SLP     2.  Impaired mobility -DVT/anticoagulation: Continue Subcutaneous heparin initiated 05/18/2021             -antiplatelet therapy: N/A 3. 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 Mood: Provigil 100 mg daily             -antipsychotic agents: N/A 5. Neuropsych: This patient is capable of making decisions on her own behalf. 6. Skin/Wound Care: Routine skin checks 7. Fluids/Electrolytes/Nutrition: Routine in and outs with follow-up chemistries 8.  Seizure prophylaxis.  Keppra completed 9.  Hypertension.  Continue Norvasc 10 mg daily, hydralazine 20 mg every 8 hours, lisinopril 40 mg daily, labetalol 200 mg 3 times daily.  Monitor with increased mobility Vitals:   06/17/21 1948 06/18/21 0522  BP: 122/76 137/88  Pulse: 70 66  Resp: 18 16  Temp: 98.6 F (37 C) 98.4 F (36.9 C)  SpO2: 97% 100%  controlled 7/7 10.  Hyperglycemia.  Hemoglobin A1c 5.7.  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   7/3 will increase HS dose to 600mg    LOS: 19 days A FACE TO FACE EVALUATION WAS PERFORMED  08/19/21 06/18/2021, 8:12 AM

## 2021-06-18 NOTE — Progress Notes (Signed)
Physical Therapy Session Note  Patient Details  Name: Marissa Morris MRN: [id number] Date of Birth: 1961/11/19  Today's Date: 06/18/2021 PT Individual Time: 1017-5102 PT Individual Time Calculation (min): 35 min   Short Term Goals: Week 3:  PT Short Term Goal 1 (Week 3): STG=LTG due to ELOS  Skilled Therapeutic Interventions/Progress Updates:    Patient up in w/c in room and reports feeling rested after early session today.  Reports anxiety about going home and not having a routine.  Reports she will stay at her sister's and her nephew's girlfriend will stay with her during the day.  Educated about keeping a routine and writing it out night before.  Assisted in w/c to ortho gym.  Performed car transfer to simulated sedan height with min A and mod cues for technique, hand placement.  Patient ambulated 68' with RW and min A mod cues for R foot clearance.  Standing heel cord stretch bilateral on wedge 3 x 30 sec with UE support and min A due to posterior bias.  Seated EOM for R hamstring stretch 3 x 20 sec.  Patient negotiated ramp with RW and min/mod A for helping with RW over threshold.  Assisted in w/c back to room and left with call bell and needs in reach, seat belt alarm active.   Therapy Documentation Precautions:  Precautions Precautions: Fall Precaution Comments: Rt hemiparesis with tone in UE/LE Restrictions Weight Bearing Restrictions: No  Pain: Pain Assessment Pain Scale: 0-10 Pain Score: 7  Pain Type: Acute pain Pain Location: Leg Pain Orientation: Right Pain Descriptors / Indicators: Aching Pain Frequency: Constant Pain Onset: On-going Patients Stated Pain Goal: 3 Pain Intervention(s): Ambulation/increased activity;Rest   Therapy/Group: Individual Therapy  Elray Mcgregor Sheran Lawless, PT 06/18/2021, 9:17 AM

## 2021-06-18 NOTE — Progress Notes (Signed)
Physical Therapy Session Note  Patient Details  Name: Marissa Morris MRN: [id number] Date of Birth: 03-Dec-1961  Today's Date: 06/18/2021 PT Individual Time: 1300-1400 PT Individual Time Calculation (min): 60 min   Short Term Goals: Week 1:  PT Short Term Goal 1 (Week 1): Pt will perform bed mobility with modA. PT Short Term Goal 1 - Progress (Week 1): Progressing toward goal PT Short Term Goal 2 (Week 1): Pt will perform bed to chair transfer with modA. PT Short Term Goal 2 - Progress (Week 1): Met PT Short Term Goal 3 (Week 1): Pt will perform sit to stand with modA. PT Short Term Goal 3 - Progress (Week 1): Progressing toward goal PT Short Term Goal 4 (Week 1): Pt will ambulate x25' with maxA and LRAD. PT Short Term Goal 4 - Progress (Week 1): Progressing toward goal Week 2:  PT Short Term Goal 1 (Week 2): Pt will perform bed mobility with modA. PT Short Term Goal 1 - Progress (Week 2): Met PT Short Term Goal 2 (Week 2): Pt will perform bed to chair transfer with consistent Min A. PT Short Term Goal 2 - Progress (Week 2): Met PT Short Term Goal 3 (Week 2): Pt will perform sit to stand consistently with Mod A to RW. PT Short Term Goal 3 - Progress (Week 2): Met PT Short Term Goal 4 (Week 2): Pt will ambulate x25' with maxA and LRAD. PT Short Term Goal 4 - Progress (Week 2): Met PT Short Term Goal 5 (Week 2): Pt will initiate w/c mobility training. PT Short Term Goal 5 - Progress (Week 2): Met Week 3:  PT Short Term Goal 1 (Week 3): STG=LTG due to ELOS  Skilled Therapeutic Interventions/Progress Updates:   Pain:  Pt reports R knee  pain w/wbing activities.  Treatment to tolerance.  Rest breaks and repositioning as needed.  See below for therex.  Pt initially oob in wc and agreeable to treatment session w/focus on functional mobility, RLE NMRE  Pt transported to gym. Sit to stand in parallel bars w/cga, cues for foot placement.  Stands w/decreased wbing RLE. Gait 97f forward/back  w/cga to min assist and bilat UE support, cues to advance RUE on bars maintains R knee flexion approx 20* w/arthritic appearing changes in joint.    Gait w/RW 316fx 2, decreased clearance and step length RLE, decreased heelstrike, cues to attend to RLE w/little effect, cues for forward gaze vs downward gaze, poor sequencing w/turning to sit in wc w/post loss of balance w/one attempt/mod assist to stabilize.  Using RUE pt worked on picking up poker chips and placing into cup then worked on screwing and unscrewing bolts /performed during breaks as active recovery actitvity. Seated therex RLE: LAQs x 15 SAQs x 15 Hs curls w/foot on towel x 20 Ankle DF x 15 Hs stretching 30-45 sec x 2  Wc propulsion using bilat Les x 305fmax cues to attend to RLE.  Defaulted to RLE only w/improved attention to task and improved mechanics x [id number] assist for powering wc. Pt left oob in wc w/alarm belt set and needs in reach   Therapy Documentation Precautions:  Precautions Precautions: Fall Precaution Comments: Rt hemiparesis with tone in UE/LE Restrictions Weight Bearing Restrictions: No    Therapy/Group: Individual Therapy BarbCallie Fielding  [place]/2022, 4:11 PM

## 2021-06-19 LAB — GLUCOSE, CAPILLARY
Glucose-Capillary: 89 mg/dL (ref 70–99)
Glucose-Capillary: 92 mg/dL (ref 70–99)
Glucose-Capillary: 105 mg/dL — ABNORMAL HIGH (ref 70–99)

## 2021-06-19 MED ORDER — GABAPENTIN 400 MG PO CAPS
400.0000 mg | ORAL_CAPSULE | Freq: Two times a day (BID) | ORAL | Status: DC
  Administered 2021-06-19 – 2021-06-22 (×6): 400 mg via ORAL
  Filled 2021-06-19 (×6): qty 1

## 2021-06-19 NOTE — Progress Notes (Signed)
Occupational Therapy Session Note  Patient Details  Name: Marissa Morris MRN: [id number] Date of Birth: 11/26/1961  Today's Date: 06/19/2021 OT Individual Time: 0800-0900 OT Individual Time Calculation (min): 60 min    Short Term Goals: Week 3:  OT Short Term Goal 1 (Week 3): Pt will don pants with min A + AE PRN. OT Short Term Goal 2 (Week 3): Pt will complete 3/3 steps toileting tasks with min A. OT Short Term Goal 3 (Week 3): Pt will don B shoes with mod A.  Skilled Therapeutic Interventions/Progress Updates:  Pt received supine in bed with NT present reporting that pt had just returned from bathroom. Pt endorses moderate numbness/ soreness in R side ( pt reports she always has this feeling in the AM), provided rest breaks and repositioning as pain mgmt strategy during session with pt able to tolerate session. Session focus on BADL reeducation, functional sit<>stands and dynamic reaching with RUE. Pt currently requires CGA for bed mobility, MIN A for EOB>w/c transfer with HHA. Pt completed bathing from w/c level at sink with pt able to complete oral care independently with assistive device ( from w/c level) and UB bathing / dressing independently from w/c level. Pt required MIN A for LB dressing needing MIN A for standing balance as pt sit<>stand from w/c to pull pants up to waist line and MOD A to don footwear.  Transported pt to gym for time mgmt, pt complete x9 sit<>stands from EOM with RW and CGA to facilitate improved LB strength and endurance, pt instructed to retrieve cards from back of mirror during each sit<>stand to facilitate functional reach with RUE and improve overall FMC with R hand. Pt transported back to room in w/c with total A, where pt was left up in w/c with alarm belt activated and all needs within reach.    Therapy Documentation Precautions:  Precautions Precautions: Fall Precaution Comments: Rt hemiparesis with tone in UE/LE Restrictions Weight Bearing Restrictions:  No    Therapy/Group: Individual Therapy  Pollyann Glen [facility] 06/19/2021, 11:19 AM

## 2021-06-19 NOTE — Progress Notes (Signed)
PROGRESS NOTE   Subjective/Complaints:  Continues to c/o tingling, pain in right face,arm, leg. Pain can come and go. Has had some periodic dizziness also  ROS: Patient denies fever, rash, sore throat, blurred vision, nausea, vomiting, diarrhea, cough, shortness of breath or chest pain, joint or back pain, headache, or mood change.    Objective:   No results found. No results for input(s): WBC, HGB, HCT, PLT in the last 72 hours.  No results for input(s): NA, K, CL, CO2, GLUCOSE, BUN, CREATININE, CALCIUM in the last 72 hours.   Intake/Output Summary (Last 24 hours) at 06/19/2021 1018 Last data filed at 06/19/2021 0737 Gross per 24 hour  Intake 1370 ml  Output --  Net 1370 ml        Physical Exam: Vital Signs Blood pressure 131/63, pulse 60, temperature 97.9 F (36.6 C), resp. rate 17, height 5\' 3"  (1.6 m), weight 73.2 kg, SpO2 97 %.   Constitutional: No distress . Vital signs reviewed. HEENT: EOMI, oral membranes moist Neck: supple Cardiovascular: RRR without murmur. No JVD    Respiratory/Chest: CTA Bilaterally without wheezes or rales. Normal effort    GI/Abdomen: BS +, non-tender, non-distended Ext: no clubbing, cyanosis, or edema Psych: pleasant and cooperative  Skin: No evidence of breakdown, no evidence of rash  Neurologic: Cranial nerves II through XII intact, motor strength is 5/5 in left and 3- right deltoid, bicep, tricep, grip, hip flexor, knee extensors, ankle dorsiflexor and plantar flexor Sensation reduced  LT RUE and RLE, reduced R side of face without hypersensitivity or allodynia.   Musculoskeletal: no knee pain with PROM, no effusion  Assessment/Plan: 1. Functional deficits which require 3+ hours per day of interdisciplinary therapy in a comprehensive inpatient rehab setting. Physiatrist is providing close team supervision and 24 hour management of active medical problems listed  below. Physiatrist and rehab team continue to assess barriers to discharge/monitor patient progress toward functional and medical goals  Care Tool:  Bathing    Body parts bathed by patient: Right arm, Left arm, Chest, Abdomen, Face, Front perineal area, Buttocks, Right upper leg, Left upper leg, Right lower leg, Left lower leg   Body parts bathed by helper: Front perineal area, Buttocks, Right upper leg, Right lower leg, Left lower leg     Bathing assist Assist Level: Minimal Assistance - Patient > 75%     Upper Body Dressing/Undressing Upper body dressing   What is the patient wearing?: Pull over shirt    Upper body assist Assist Level: Supervision/Verbal cueing    Lower Body Dressing/Undressing Lower body dressing      What is the patient wearing?: Pants     Lower body assist Assist for lower body dressing: Minimal Assistance - Patient > 75%     Toileting Toileting Toileting Activity did not occur (Clothing management and hygiene only): N/A (no void or bm)  Toileting assist Assist for toileting: Minimal Assistance - Patient > 75%     Transfers Chair/bed transfer  Transfers assist     Chair/bed transfer assist level: Minimal Assistance - Patient > 75%     Locomotion Ambulation   Ambulation assist      Assist level: Moderate  Assistance - Patient 50 - 74% Assistive device: Walker-rolling (R air cast) Max distance: 40'   Walk 10 feet activity   Assist  Walk 10 feet activity did not occur: Safety/medical concerns  Assist level: Moderate Assistance - Patient - 50 - 74% Assistive device: Walker-rolling, Orthosis   Walk 50 feet activity   Assist Walk 50 feet with 2 turns activity did not occur: Safety/medical concerns         Walk 150 feet activity   Assist Walk 150 feet activity did not occur: Safety/medical concerns         Walk 10 feet on uneven surface  activity   Assist Walk 10 feet on uneven surfaces activity did not occur:  Safety/medical concerns         Wheelchair     Assist Will patient use wheelchair at discharge?: No Type of Wheelchair: Manual    Wheelchair assist level: Minimal Assistance - Patient > 75% Max wheelchair distance: 25    Wheelchair 50 feet with 2 turns activity    Assist        Assist Level: Minimal Assistance - Patient > 75%   Wheelchair 150 feet activity     Assist          Blood pressure 131/63, pulse 60, temperature 97.9 F (36.6 C), resp. rate 17, height 5\' 3"  (1.6 m), weight 73.2 kg, SpO2 97 %.   Medical Problem List and Plan: 1.  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  Status post right frontal ventriculostomy for hydrocephalus             -patient may  shower             -ELOS/Goals: 7/14 cont CIR PT, OT, SLP     2.  Impaired mobility -DVT/anticoagulation: Continue Subcutaneous heparin initiated 05/18/2021             -antiplatelet therapy: N/A 3. 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 Mood: Provigil 100 mg daily             -antipsychotic agents: N/A 5. Neuropsych: This patient is capable of making decisions on her own behalf. 6. Skin/Wound Care: Routine skin checks 7. Fluids/Electrolytes/Nutrition: Routine in and outs with follow-up chemistries 8.  Seizure prophylaxis.  Keppra completed 9.  Hypertension.  Continue Norvasc 10 mg daily, hydralazine 20 mg every 8 hours, lisinopril 40 mg daily, labetalol 200 mg 3 times daily.  Monitor with increased mobility Vitals:   06/18/21 1934 06/19/21 0448  BP: 131/68 131/63  Pulse: 67 60  Resp: 17 17  Temp: 98.3 F (36.8 C) 97.9 F (36.6 C)  SpO2: 98% 97%  controlled 7/8 10.   Hyperglycemia.  Hemoglobin A1c 5.7.  SSI CBG (last 3)  Recent Labs    06/18/21 1154 06/18/21 1648 06/19/21 0610  GLUCAP 93 101* 89  Ordered CBG because pt states she feels woozy a couple hours after meals an dthis improves with juice- thus far no evidence of hypoglycemia on pre prandial CBGs---dc cbg checks  11.  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   7/8 c/o persistent pain--will increase gabapentin to 400-400-600mg  daily  LOS: 20 days A FACE TO FACE EVALUATION WAS PERFORMED  Ranelle Oyster 06/19/2021, 10:18 AM

## 2021-06-19 NOTE — Progress Notes (Signed)
Patient ID: Marissa Morris, female   DOB: [birth year], 60 y.o.   MRN: [id number]  [facility] : [facility] agency list provided to pt and sister  Lavera Guise, [place] [phone]

## 2021-06-19 NOTE — Progress Notes (Signed)
Physical Therapy Session Note  Patient Details  Name: Marissa Morris MRN: [id number] Date of Birth: [birth year]  Today's Date: 06/19/2021 PT Individual Time: 5188-4166 PT Individual Time Calculation (min): 45 min   Short Term Goals: Week 1:  PT Short Term Goal 1 (Week 1): Pt will perform bed mobility with modA. PT Short Term Goal 1 - Progress (Week 1): Progressing toward goal PT Short Term Goal 2 (Week 1): Pt will perform bed to chair transfer with modA. PT Short Term Goal 2 - Progress (Week 1): Met PT Short Term Goal 3 (Week 1): Pt will perform sit to stand with modA. PT Short Term Goal 3 - Progress (Week 1): Met PT Short Term Goal 4 (Week 1): Pt will ambulate x25' with maxA and LRAD. PT Short Term Goal 4 - Progress (Week 1): Progressing toward goal Week 2:  PT Short Term Goal 1 (Week 2): Pt will perform bed mobility with modA. PT Short Term Goal 1 - Progress (Week 2): Met PT Short Term Goal 2 (Week 2): Pt will perform bed to chair transfer with consistent Min A. PT Short Term Goal 2 - Progress (Week 2): Met PT Short Term Goal 3 (Week 2): Pt will perform sit to stand consistently with Mod A to RW. PT Short Term Goal 3 - Progress (Week 2): Met PT Short Term Goal 4 (Week 2): Pt will ambulate x25' with maxA and LRAD. PT Short Term Goal 4 - Progress (Week 2): Met PT Short Term Goal 5 (Week 2): Pt will initiate w/c mobility training. PT Short Term Goal 5 - Progress (Week 2): Met Week 3:  PT Short Term Goal 1 (Week 3): STG=LTG due to ELOS   Skilled Therapeutic Interventions/Progress Updates:    Pt received sitting in chair and agreeable to PT session upon entry. Gait belt placed prior to initiation of mobility. Pt wheeled towards dayroom for time management.   Therapy session focused on gait training and endurance. Pt ambulated 43f with RW CGA- MinA (+2 wc follow) for manual facilitation of weight shifting to allow for improved swing phase bilaterally. Pt demonstrated a step to gait pattern  on the LLE, decreased BOS, and decreased stance time on RLE.  Verbal and visual cuing for increased knee flexion, posture, increased step width with RLE, and increased step length on LLE. Noted improvement with right knee flexion in swing with weight shifting assistance from therapist. Rest break given in wc. Pt ambulated 1767ffrom therapy gym to patient's room with same assist as first round. Noted as patient fatigued, the last 3031frogressed to heavy MinA for facilitation of weight shifting off RLE to allow for increased knee flexion during swing phase. Also increased instances of verbal cuing for knee flexion and heel strike secondary to poor foot clearance on RLE which lead to catching forefoot on ground prematurely. Pt able to self correct, no LOB noted.   Pt returned to wc once in doorway of room. Seatbelt alarm placed, call bell within reach, and ice water given to pt (per request) at the end of session. No further needs expressed at this time.     Therapy Documentation Precautions:  Precautions Precautions: Fall Precaution Comments: Rt hemiparesis with tone in UE/LE Restrictions Weight Bearing Restrictions: No  Pain: Pain Assessment Pain Scale: 0-10 Pain Score: 0-No pain Faces Pain Scale: No hurt   Therapy/Group: Individual Therapy  Grantland Want, SPT 06/19/2021, 4:51 PM

## 2021-06-19 NOTE — Progress Notes (Signed)
Physical Therapy Session Note  Patient Details  Name: Marissa Morris MRN: [id number] Date of Birth: 01-30-1961  Today's Date: 06/19/2021 PT Individual Time: 8628-2417 PT Individual Time Calculation (min): 60 min   Short Term Goals: Week 3:  PT Short Term Goal 1 (Week 3): STG=LTG due to ELOS  Skilled Therapeutic Interventions/Progress Updates: Pt presented in w/c agreeable to therapy. Pt c/o pain in R knee and per pt new blurriness in R visual field intermittently. Pt propelled to day room using BLE with verbal cues for "kicking out" with RLE for increased attention. Participated in gait training 31f x 2 with x 2 R turning with significantly shortened R step length nearing step to pattern initially with verbal cues to increase RLE abduction for improved BOS. PTA noted some spasticity initially with Sit to stand with pt expressing "difficult to control". After ambulation PTA performed gentle stretching of RLE into DF with pt expressing some relief. Pt also performed toe taps to target of RLE including crossing midline and alternating toe taps to 4in step with PTA providing verbal cues and facilitation at R quad for increased facilitation x 10 bilaterally.  Performed standing bout of corn hole with RUE for forced use emphasizing reach outside BOS. Pt transported back to room at end of session due to fatigue. Pt left in w/c at end of session with belt alarm on, call bell within reach and needs met.      Therapy Documentation Precautions:  Precautions Precautions: Fall Precaution Comments: Rt hemiparesis with tone in UE/LE Restrictions Weight Bearing Restrictions: No General:   Vital Signs:   Pain: Pain Assessment Pain Scale: 0-10 Pain Score: 0-No pain Mobility:   Locomotion :    Trunk/Postural Assessment :    Balance:   Exercises:   Other Treatments:      Therapy/Group: Individual Therapy  Grantham Hippert 06/19/2021, 12:14 PM

## 2021-06-19 NOTE — Progress Notes (Addendum)
Speech Language Pathology Daily Session Note  Patient Details  Name: Jennfer Gassen MRN: [id number] Date of Birth: 14-Apr-1961  Today's Date: 06/19/2021 SLP Individual Time: 1345-1430 SLP Individual Time Calculation (min): 45 min  Short Term Goals: Week 3: SLP Short Term Goal 1 (Week 3): STGs=LTGs due to ELOS  Skilled Therapeutic Interventions: Skilled ST intervention performed with focus on cognitive goals. Facilitated medication management task involving organizing weekly pillbox based on medications recorded on medication chart during yesterday's session. Patient successfully organized 8/8 medications in am/noon/pm pillbox with 100% accuracy at supervision level for processing of medication instructions (twice daily vs. 3x daily). Patient displayed effective self monitoring skills, awareness, and working memory throughout task. She also participated in problem solving scenarios in which she responded at sup level including additional processing time, some hesitancy, and occasional verbal repetition of scenarios. Patient likely to discharge at supervision level for medication management. Her sister is able to provide support and supervision at discharge. Patient was left in her wheelchair with alarm activated and needs within reach at end of session. Continue per ST plan of care.    Pain Pain Assessment Pain Scale: 0-10 Pain Score: 0-No pain  Therapy/Group: Individual Therapy  Tamala Ser 06/19/2021, 3:39 PM

## 2021-06-20 LAB — GLUCOSE, CAPILLARY
Glucose-Capillary: 92 mg/dL (ref 70–99)
Glucose-Capillary: 99 mg/dL (ref 70–99)
Glucose-Capillary: 97 mg/dL (ref 70–99)
Glucose-Capillary: 105 mg/dL — ABNORMAL HIGH (ref 70–99)

## 2021-06-20 MED ORDER — BACLOFEN 5 MG HALF TABLET
5.0000 mg | ORAL_TABLET | Freq: Every day | ORAL | Status: DC
  Administered 2021-06-20: 5 mg via ORAL
  Filled 2021-06-20: qty 1

## 2021-06-20 NOTE — Progress Notes (Signed)
Physical Therapy Session Note  Patient Details  Name: Marissa Morris MRN: [id number] Date of Birth: 1961/08/08  Today's Date: 06/20/2021 PT Individual Time: 1005-1100 PT Individual Time Calculation (min): 55 min   Short Term Goals: Week 3:  PT Short Term Goal 1 (Week 3): STG=LTG due to ELOS   Skilled Therapeutic Interventions/Progress Updates:    Pt received sitting in wheelchair and agreeable to PT session. Pt is eager to participate and states "I wish I had more therapy than just one class today." Therapist readjusts air cast on RLE secondary to pt reporting discomfort prior to being wheeled to 4w gym for time management.  Gait training: Pt ambulated 30 ft with RW CGA and turn towards left half way. Rest break given prior to step training. Post step training pt ambulated 65ft RW CGA. During both bouts, verbal and visual cuing for increased weight shifting towards stance leg, increased step length to promote step through pattern on LLE, and slightly abducting RLE to prevent narrowed BOS. Second bout of gait training focused heavily on step through, continuous gait pattern with RW. Noted improvement towards more consistent step through pattern with additional verbal cuing to take a shorter step with the RLE.   Step training: Therapist demonstrated sequencing step up on 4" step with management of the RW x2. Pt completed activity with ModA for balance and effort. Mutlimodal cuing for sequencing with standing breaks before stepping up/down. Pt able to verbalize understanding of leading with LLE up the step and down with the RLE. Tactile cuing given during stance on RLE with step management in order to increase muscular recruitment to prevent buckling.   Throughout therapy session pt states uneasiness in upcoming discharge home and states "I just need more time to get this leg working." Therapist reassured of the great progress she has made throughout her rehab (especially within the last week),  continuation of therapy outside of CIR, and that she would not be discharged home if it was not safe.   Pt sitting in wheelchair with seatbelt alarm on and call bell within reach at the end of the session.   Therapy Documentation Precautions:  Precautions Precautions: Fall Precaution Comments: Rt hemiparesis with tone in UE/LE Restrictions Weight Bearing Restrictions: No  Pain: Pain Assessment Pain Scale: 0-10 Pain Score: 0-No pain Faces Pain Scale: No hurt   Therapy/Group: Individual Therapy  Jeslynn Hollander, SPT 06/20/2021, 12:56 PM

## 2021-06-20 NOTE — Progress Notes (Signed)
Occupational Therapy Weekly Progress Note  Patient Details  Name: Marissa Morris MRN: [id number] Date of Birth: [birth year]  Beginning of progress report period: June 13, 2021 End of progress report period: June 20, 2021   Patient has met 3 of 3 short term goals.  Pt has made great progress this week in OT. Pt has demonstrated improved ability to compensate for deficits, activity tolerance, functional use of RUE, improved use of pain management strategies, and dynamic and static standing balance to presently complete UB ADL  at set-up A level, LB ADL at min A level, and functional transfers with CGA to min A via squat or stand-pivot. Pt cont to be primarily limited by ongoing RUE/RLE numbness and pain. RUE and hand currently assessed at Brummstrom level III and V respectively. Anticipate intermittent S and min physical assist required upon DC.   Patient continues to demonstrate the following deficits: muscle weakness, decreased cardiorespiratoy endurance, impaired timing and sequencing, abnormal tone, unbalanced muscle activation, decreased coordination, and decreased motor planning, decreased awareness, decreased problem solving, decreased safety awareness, and decreased memory, and decreased standing balance, decreased postural control, hemiplegia, and decreased balance strategies and therefore will continue to benefit from skilled OT intervention to enhance overall performance with BADL and Reduce care partner burden.  Patient progressing toward long term goals..  Continue plan of care.  OT Short Term Goals Week 4:  OT Short Term Goal 1 (Week 4): STG = LTG 2/2 ELOS   Volanda Napoleon MS, OTR/L  06/20/2021, 3:08 PM

## 2021-06-20 NOTE — Progress Notes (Signed)
PROGRESS NOTE   Subjective/Complaints: Complains of stiffness and numbness in lower extremity- nervous about discharging with these symptoms.   ROS: Patient denies fever, rash, sore throat, blurred vision, nausea, vomiting, diarrhea, cough, shortness of breath or chest pain, joint or back pain, headache, or mood change.    Objective:   No results found. No results for input(s): WBC, HGB, HCT, PLT in the last 72 hours.  No results for input(s): NA, K, CL, CO2, GLUCOSE, BUN, CREATININE, CALCIUM in the last 72 hours.   Intake/Output Summary (Last 24 hours) at 06/20/2021 1733 Last data filed at 06/20/2021 1300 Gross per 24 hour  Intake 590 ml  Output --  Net 590 ml        Physical Exam: Vital Signs Blood pressure (!) 131/97, pulse 69, temperature 98.6 F (37 C), temperature source Oral, resp. rate 18, height 5\' 3"  (1.6 m), weight 73.2 kg, SpO2 100 %. Gen: no distress, normal appearing HEENT: oral mucosa pink and moist, NCAT Cardio: Reg rate Chest: normal effort, normal rate of breathing Abd: soft, non-distended Ext: no edema Psych: pleasant, normal affect Skin: intact  Neurologic: Cranial nerves II through XII intact, motor strength is 5/5 in left and 3- right deltoid, bicep, tricep, grip, hip flexor, knee extensors, ankle dorsiflexor and plantar flexor Sensation reduced  LT RUE and RLE, reduced R side of face without hypersensitivity or allodynia.   Musculoskeletal: no knee pain with PROM, no effusion  Assessment/Plan: 1. Functional deficits which require 3+ hours per day of interdisciplinary therapy in a comprehensive inpatient rehab setting. Physiatrist is providing close team supervision and 24 hour management of active medical problems listed below. Physiatrist and rehab team continue to assess barriers to discharge/monitor patient progress toward functional and medical goals  Care Tool:  Bathing    Body  parts bathed by patient: Right arm, Left arm, Chest, Abdomen, Face   Body parts bathed by helper: Front perineal area, Buttocks, Right upper leg, Right lower leg, Left lower leg     Bathing assist Assist Level: Independent with assistive device Assistive Device Comment: at sink from w/c level   Upper Body Dressing/Undressing Upper body dressing   What is the patient wearing?: Pull over shirt    Upper body assist Assist Level: Independent with assistive device Assistive Device Comment: from w/c level; able to retrieve shirt from drawer  Lower Body Dressing/Undressing Lower body dressing      What is the patient wearing?: Pants     Lower body assist Assist for lower body dressing: Minimal Assistance - Patient > 75% (MIN A for balance when standing to pull pants up to waist line)     Toileting Toileting Toileting Activity did not occur (Clothing management and hygiene only): N/A (no void or bm)  Toileting assist Assist for toileting: Minimal Assistance - Patient > 75%     Transfers Chair/bed transfer  Transfers assist     Chair/bed transfer assist level: Minimal Assistance - Patient > 75%     Locomotion Ambulation   Ambulation assist      Assist level: Moderate Assistance - Patient 50 - 74% Assistive device: Walker-rolling (R air cast) Max distance: 66'  Walk 10 feet activity   Assist  Walk 10 feet activity did not occur: Safety/medical concerns  Assist level: Moderate Assistance - Patient - 50 - 74% Assistive device: Walker-rolling, Orthosis   Walk 50 feet activity   Assist Walk 50 feet with 2 turns activity did not occur: Safety/medical concerns         Walk 150 feet activity   Assist Walk 150 feet activity did not occur: Safety/medical concerns         Walk 10 feet on uneven surface  activity   Assist Walk 10 feet on uneven surfaces activity did not occur: Safety/medical concerns         Wheelchair     Assist Will patient  use wheelchair at discharge?: No Type of Wheelchair: Manual    Wheelchair assist level: Minimal Assistance - Patient > 75% Max wheelchair distance: 25    Wheelchair 50 feet with 2 turns activity    Assist        Assist Level: Minimal Assistance - Patient > 75%   Wheelchair 150 feet activity     Assist          Blood pressure (!) 131/97, pulse 69, temperature 98.6 F (37 C), temperature source Oral, resp. rate 18, height 5\' 3"  (1.6 m), weight 73.2 kg, SpO2 100 %.   Medical Problem List and Plan: 1.  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  Status post right frontal ventriculostomy for hydrocephalus             -patient may  shower             -ELOS/Goals: 7/14 cont CIR PT, OT, SLP   -Continue CIR 2.  Impaired mobility, ambulating 70 feet -DVT/anticoagulation: Continue Subcutaneous heparin initiated 05/18/2021             -antiplatelet therapy: N/A 3. 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 Mood: Provigil 100 mg daily             -antipsychotic agents: N/A 5. Neuropsych: This patient is capable of making decisions on her own behalf. 6. Skin/Wound Care: Routine skin checks 7. Fluids/Electrolytes/Nutrition: Routine in and outs with follow-up chemistries 8.  Seizure prophylaxis.  Keppra completed 9.  Hypertension.  Continue Norvasc 10 mg daily, hydralazine 20 mg every 8 hours, lisinopril 40 mg daily, labetalol 200 mg 3 times daily.  Monitor with increased mobility Vitals:   06/20/21 0451 06/20/21 1236  BP: (!) 120/58 (!) 131/97  Pulse: 60 69  Resp: 18 18  Temp: 98 F (36.7 C) 98.6 F (37 C)  SpO2: 96% 100%  controlled 7/8 10.  Hyperglycemia.  Hemoglobin A1c 5.7.   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   7/8 c/o persistent pain--will increase gabapentin to 400-400-600mg  daily 12. Increased tone on right side: start baclofen 5mg  HS  LOS: 21 days A FACE TO FACE EVALUATION WAS PERFORMED  08/21/21  P Janyth Riera 06/20/2021, 5:33 PM

## 2021-06-21 LAB — GLUCOSE, CAPILLARY
Glucose-Capillary: 89 mg/dL (ref 70–99)
Glucose-Capillary: 104 mg/dL — ABNORMAL HIGH (ref 70–99)
Glucose-Capillary: 101 mg/dL — ABNORMAL HIGH (ref 70–99)
Glucose-Capillary: 93 mg/dL (ref 70–99)

## 2021-06-21 MED ORDER — BACLOFEN 10 MG PO TABS
10.0000 mg | ORAL_TABLET | Freq: Every day | ORAL | Status: DC
  Administered 2021-06-21 – 2021-06-24 (×4): 10 mg via ORAL
  Filled 2021-06-21 (×5): qty 1

## 2021-06-21 NOTE — Progress Notes (Signed)
PROGRESS NOTE   Subjective/Complaints: Slept better last night but still woke up in middle of night, still complains of right leg stiffness. Discussed increasing baclofen HS to 10mg  and she is agreeable.   ROS: Patient denies fever, rash, sore throat, blurred vision, nausea, vomiting, diarrhea, cough, shortness of breath or chest pain, joint or back pain, headache, or mood change. +"tightness" right leg   Objective:   No results found. No results for input(s): WBC, HGB, HCT, PLT in the last 72 hours.  No results for input(s): NA, K, CL, CO2, GLUCOSE, BUN, CREATININE, CALCIUM in the last 72 hours.   Intake/Output Summary (Last 24 hours) at 06/21/2021 1805 Last data filed at 06/21/2021 1332 Gross per 24 hour  Intake 1160 ml  Output --  Net 1160 ml        Physical Exam: Vital Signs Blood pressure 115/60, pulse 67, temperature 98.2 F (36.8 C), temperature source Oral, resp. rate 17, height 5\' 3"  (1.6 m), weight 73.2 kg, SpO2 98 %. Gen: no distress, normal appearing HEENT: oral mucosa pink and moist, NCAT Cardio: Reg rate Chest: normal effort, normal rate of breathing Abd: soft, non-distended Ext: no edema Psych: pleasant, normal affect Skin: intact  Neurologic: Cranial nerves II through XII intact, motor strength is 5/5 in left and 3- right deltoid, bicep, tricep, grip, hip flexor, knee extensors, ankle dorsiflexor and plantar flexor Sensation reduced  LT RUE and RLE, reduced R side of face without hypersensitivity or allodynia.   Musculoskeletal: no knee pain with PROM, no effusion  Assessment/Plan: 1. Functional deficits which require 3+ hours per day of interdisciplinary therapy in a comprehensive inpatient rehab setting. Physiatrist is providing close team supervision and 24 hour management of active medical problems listed below. Physiatrist and rehab team continue to assess barriers to discharge/monitor patient  progress toward functional and medical goals  Care Tool:  Bathing    Body parts bathed by patient: Right arm, Left arm, Chest, Abdomen, Face   Body parts bathed by helper: Front perineal area, Buttocks, Right upper leg, Right lower leg, Left lower leg     Bathing assist Assist Level: Independent with assistive device Assistive Device Comment: at sink from w/c level   Upper Body Dressing/Undressing Upper body dressing   What is the patient wearing?: Pull over shirt    Upper body assist Assist Level: Independent with assistive device Assistive Device Comment: from w/c level; able to retrieve shirt from drawer  Lower Body Dressing/Undressing Lower body dressing      What is the patient wearing?: Pants     Lower body assist Assist for lower body dressing: Minimal Assistance - Patient > 75% (MIN A for balance when standing to pull pants up to waist line)     Toileting Toileting Toileting Activity did not occur (Clothing management and hygiene only): N/A (no void or bm)  Toileting assist Assist for toileting: Minimal Assistance - Patient > 75%     Transfers Chair/bed transfer  Transfers assist     Chair/bed transfer assist level: Contact Guard/Touching assist     Locomotion Ambulation   Ambulation assist      Assist level: Minimal Assistance - Patient > 75%  Assistive device: Walker-rolling Max distance: 50'   Walk 10 feet activity   Assist  Walk 10 feet activity did not occur: Safety/medical concerns  Assist level: Minimal Assistance - Patient > 75% Assistive device: Walker-rolling   Walk 50 feet activity   Assist Walk 50 feet with 2 turns activity did not occur: Safety/medical concerns  Assist level: Minimal Assistance - Patient > 75% Assistive device: Walker-rolling    Walk 150 feet activity   Assist Walk 150 feet activity did not occur: Safety/medical concerns         Walk 10 feet on uneven surface  activity   Assist Walk 10 feet on  uneven surfaces activity did not occur: Safety/medical concerns         Wheelchair     Assist Will patient use wheelchair at discharge?: No Type of Wheelchair: Manual    Wheelchair assist level: Minimal Assistance - Patient > 75% Max wheelchair distance: 25    Wheelchair 50 feet with 2 turns activity    Assist        Assist Level: Minimal Assistance - Patient > 75%   Wheelchair 150 feet activity     Assist          Blood pressure 115/60, pulse 67, temperature 98.2 F (36.8 C), temperature source Oral, resp. rate 17, height 5\' 3"  (1.6 m), weight 73.2 kg, SpO2 98 %.   Medical Problem List and Plan: 1.  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  Status post right frontal ventriculostomy for hydrocephalus             -patient may  shower             -ELOS/Goals: 7/14 cont CIR PT, OT, SLP   -Continue CIR 2.  Impaired mobility, ambulating 100 feet -DVT/anticoagulation: Continue Subcutaneous heparin initiated 05/18/2021             -antiplatelet therapy: N/A 3. 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 Mood: Provigil 100 mg daily             -antipsychotic agents: N/A 5. Neuropsych: This patient is capable of making decisions on her own behalf. 6. Skin/Wound Care: Routine skin checks 7. Fluids/Electrolytes/Nutrition: Routine in and outs with follow-up chemistries 8.  Seizure prophylaxis.  Keppra completed 9.  Hypertension.  Continue Norvasc 10 mg daily, hydralazine 20 mg every 8 hours, lisinopril 40 mg daily, labetalol 200 mg 3 times daily.  Monitor with increased mobility Vitals:   06/21/21 0433 06/21/21 1332  BP: 124/68 115/60  Pulse: 70 67  Resp: 18 17  Temp: 98.1 F  (36.7 C) 98.2 F (36.8 C)  SpO2: 99% 98%  controlled 7/10 10.  Hyperglycemia.  Hemoglobin A1c 5.7.  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   7/8 c/o persistent pain--will increase gabapentin to 400-400-600mg  daily 12. Increased tone on right side, quite well controlled but bothersome to patient: increase  baclofen to 10mg  HS (also with insomnia)  LOS: 22 days A FACE TO FACE EVALUATION WAS PERFORMED  P Semaj Kham 06/21/2021, 6:05 PM

## 2021-06-21 NOTE — Progress Notes (Signed)
Physical Therapy Session Note  Patient Details  Name: Marissa Morris MRN: [id number] Date of Birth: 07-15-1961  Today's Date: 06/21/2021 PT Individual Time: 1100-1200 PT Individual Time Calculation (min): 60 min   Short Term Goals: Week 3:  PT Short Term Goal 1 (Week 3): STG=LTG due to ELOS  Skilled Therapeutic Interventions/Progress Updates:    Pt received seated in bed, dressed and ready for therapy session. Pt reports some soreness in RLE at rest as well as "numbness" in RUE and RLE. Supine to sit with CGA with HOB elevated. Stand pivot transfer to w/c with RW and CGA. Pt is setup A for oral hygiene while seated in w/c at sink. Sit to stand with CGA to RW. Ambulation 2 x 50 ft with RW and min A, cues for increasing R hip flexion and knee flexion. Pt initially exhibits step-to gait pattern, progresses to step-through with cueing. Pt also exhibits flexed trunk during gait due to impaired RLE proprioception. Pt reports feeling "dizzy" and "sleepy" during gait that improves with seated rest break. Standing alt L/R 3" step-ups with min A for balance, manual cues and assist for R knee control. Pt exhibits decreased ability to contract R quads and reach full knee extension in standing. Attempt seated quad-sets, fair ability to initiate quad contraction. Seated RLE LAQ x 10 reps with cues for achieving full ROM. Pt left seated in w/c in room with needs in reach, quick release belt and chair alarm in place at end of session.  Therapy Documentation Precautions:  Precautions Precautions: Fall Precaution Comments: Rt hemiparesis with tone in UE/LE Restrictions Weight Bearing Restrictions: No   Therapy/Group: Individual Therapy   Peter Congo, PT, DPT, CSRS  06/21/2021, 12:14 PM

## 2021-06-21 NOTE — Progress Notes (Signed)
Occupational Therapy Session Note  Patient Details  Name: Marissa Morris MRN: [id number] Date of Birth: 04/05/1961  Today's Date: 06/21/2021 OT Individual Time: 1420-1451 OT Individual Time Calculation (min): 31 min    Short Term Goals: Week 4:  OT Short Term Goal 1 (Week 4): STG = LTG 2/2 ELOS  Skilled Therapeutic Interventions/Progress Updates:    Pt received seated in w/c, agreeable to OT, reporting no pain interfering with session. Session focused on RUE NMR, endurance, and standing balance/tolerance in preparation for increased independence in ADLs and functional mobility. Pt taken to gym to complete standing level table top task. Sit<>stand min A-CGA for power up; R knee blocked intermittently for manual facilitation to stay EXT and not flexed. Pt completed pushpin activity aimed at FMC focusing on using RUE for anterior reaching, pincer grasp, finger<>palm translation and rotation skills. Pt demoed appropriate skills finger>palm translation but palm>finger translation was more challenging this session. Pt tolerated 2 standing trials of ~5 min each to complete task with one rest break due to fatigue. Mod vc's and tactile cues for posture and R sided weight bearing. Pt returned to room and remained seated in w/c, alarm set, all immediate needs met, call bell in reach.   Therapy Documentation Precautions:  Precautions Precautions: Fall Precaution Comments: Rt hemiparesis with tone in UE/LE Restrictions Weight Bearing Restrictions: No  Pain: Pain Assessment Pain Scale: 0-10 Pain Score: 0-No pain   Therapy/Group: Individual Therapy   N  06/21/2021, 3:56 PM 

## 2021-06-22 LAB — GLUCOSE, CAPILLARY
Glucose-Capillary: 98 mg/dL (ref 70–99)
Glucose-Capillary: 116 mg/dL — ABNORMAL HIGH (ref 70–99)
Glucose-Capillary: 84 mg/dL (ref 70–99)

## 2021-06-22 MED ORDER — GABAPENTIN 400 MG PO CAPS
400.0000 mg | ORAL_CAPSULE | Freq: Three times a day (TID) | ORAL | Status: DC
  Administered 2021-06-22 – 2021-06-25 (×11): 400 mg via ORAL
  Filled 2021-06-22 (×12): qty 1

## 2021-06-22 NOTE — Progress Notes (Signed)
Occupational Therapy Session Note  Patient Details  Name: Marissa Morris MRN: [id number] Date of Birth: 1961-10-09  Today's Date: 06/22/2021 OT Individual Time: 0800-0900 OT Individual Time Calculation (min): 60 min    Short Term Goals: Week 4:  OT Short Term Goal 1 (Week 4): STG = LTG 2/2 ELOS  Skilled Therapeutic Interventions/Progress Updates:  Pt recevied supine in bed eager to mobilize OOB for therapy session. Pt currently requires supervision for bed mobility and CGA for stand pivot transfer from EOB>w/c with no AD. Pt completed wash up from sink with pt able to complete UB bathing independently with assistive device ( from w/c level). Pt completed UB dressing/ oral care independently with assistive device, LB dressing with MINA needing MIN A for standing balance while pt pulled pants up to waist line. Pt needed set- up of footwear but able to don ankle air cast, shoes and socks from w/c level. Pt transported to day room with total A for time mgmt. Pt request to work on "warming up RLE" from w/c level. Pt completed x20 reps of seated marching and x20 reps of LAQs with RLE. Pt completed ~ 30 ft of functional ambulation with MIN A with RW, cues needed to full extend R knee during mobility, pt required one seated rest break in between ~ 28ft. Pt completed w/c propulsion back to room with pt needing assist to navigate obstacles on R side of environment. Pt left seated in w/c with SLP entering for session.   Therapy Documentation Precautions:  Precautions Precautions: Fall Precaution Comments: Rt hemiparesis with tone in UE/LE Restrictions Weight Bearing Restrictions: No Pain: Pt reports pain in R side of body, pt premedicated prior to session offered rest breaks and therapeutic exercises as pain mgmt strategy during session.     Therapy/Group: Individual Therapy  Pollyann Glen [facility] 06/22/2021, 10:19 AM

## 2021-06-22 NOTE — Progress Notes (Signed)
PROGRESS NOTE   Subjective/Complaints:  No issues overnite , cont to have intermitted RUE, RLE and Right hemifacial pain ROS: Patient denies CP, SOB, N/V/D   Objective:   No results found. No results for input(s): WBC, HGB, HCT, PLT in the last 72 hours.  No results for input(s): NA, K, CL, CO2, GLUCOSE, BUN, CREATININE, CALCIUM in the last 72 hours.   Intake/Output Summary (Last 24 hours) at 06/22/2021 1147 Last data filed at 06/22/2021 0733 Gross per 24 hour  Intake 1074 ml  Output --  Net 1074 ml         Physical Exam: Vital Signs Blood pressure (!) 141/78, pulse 70, temperature 97.7 F (36.5 C), temperature source Oral, resp. rate 18, height 5\' 3"  (1.6 m), weight 73.2 kg, SpO2 99 %.  General: No acute distress Mood and affect are appropriate Heart: Regular rate and rhythm no rubs murmurs or extra sounds Lungs: Clear to auscultation, breathing unlabored, no rales or wheezes Abdomen: Positive bowel sounds, soft nontender to palpation, nondistended Extremities: No clubbing, cyanosis, or edema Skin: No evidence of breakdown, no evidence of rash  Cerebellar exam normal finger to nose to finger as well as heel to shin in bilateral upper and lower extremities Musculoskeletal: Full range of motion in all 4 extremities. No joint swelling   Neurologic: Cranial nerves II through XII intact, motor strength is 5/5 in left and 3- right deltoid, bicep, tricep, grip, hip flexor, knee extensors, ankle dorsiflexor and plantar flexor Sensation reduced  LT RUE and RLE, reduced R side of face without hypersensitivity or allodynia.   Musculoskeletal: no knee pain with PROM, no effusion  Assessment/Plan: 1. Functional deficits which require 3+ hours per day of interdisciplinary therapy in a comprehensive inpatient rehab setting. Physiatrist is providing close team supervision and 24 hour management of active medical problems  listed below. Physiatrist and rehab team continue to assess barriers to discharge/monitor patient progress toward functional and medical goals  Care Tool:  Bathing    Body parts bathed by patient: Right arm, Left arm, Chest, Abdomen, Face   Body parts bathed by helper: Front perineal area, Buttocks, Right upper leg, Right lower leg, Left lower leg     Bathing assist Assist Level: Independent with assistive device Assistive Device Comment: at sink from w/c level   Upper Body Dressing/Undressing Upper body dressing   What is the patient wearing?: Pull over shirt    Upper body assist Assist Level: Independent with assistive device Assistive Device Comment: from w/c level; able to retrieve shirt from drawer  Lower Body Dressing/Undressing Lower body dressing      What is the patient wearing?: Pants     Lower body assist Assist for lower body dressing: Minimal Assistance - Patient > 75%     Toileting Toileting Toileting Activity did not occur (Clothing management and hygiene only): N/A (no void or bm)  Toileting assist Assist for toileting: Minimal Assistance - Patient > 75%     Transfers Chair/bed transfer  Transfers assist     Chair/bed transfer assist level: Contact Guard/Touching assist     Locomotion Ambulation   Ambulation assist      Assist level: Minimal  Assistance - Patient > 75% Assistive device: Walker-rolling Max distance: 50'   Walk 10 feet activity   Assist  Walk 10 feet activity did not occur: Safety/medical concerns  Assist level: Minimal Assistance - Patient > 75% Assistive device: Walker-rolling   Walk 50 feet activity   Assist Walk 50 feet with 2 turns activity did not occur: Safety/medical concerns  Assist level: Minimal Assistance - Patient > 75% Assistive device: Walker-rolling    Walk 150 feet activity   Assist Walk 150 feet activity did not occur: Safety/medical concerns         Walk 10 feet on uneven surface   activity   Assist Walk 10 feet on uneven surfaces activity did not occur: Safety/medical concerns         Wheelchair     Assist Will patient use wheelchair at discharge?: No Type of Wheelchair: Manual    Wheelchair assist level: Minimal Assistance - Patient > 75% Max wheelchair distance: 25    Wheelchair 50 feet with 2 turns activity    Assist        Assist Level: Minimal Assistance - Patient > 75%   Wheelchair 150 feet activity     Assist          Blood pressure (!) 141/78, pulse 70, temperature 97.7 F (36.5 C), temperature source Oral, resp. rate 18, height 5\' 3"  (1.6 m), weight 73.2 kg, SpO2 99 %.   Medical Problem List and Plan: 1.  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  Status post right frontal ventriculostomy for hydrocephalus             -patient may  shower             -ELOS/Goals: 7/14 cont CIR PT, OT, SLP   -Continue CIR 2.  Impaired mobility, ambulating 100 feet -DVT/anticoagulation: Continue Subcutaneous heparin initiated 05/18/2021             -antiplatelet therapy: N/A 3. 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 Mood: Provigil 100 mg daily             -antipsychotic agents: N/A 5. Neuropsych: This patient is capable of making decisions on her own behalf. 6. Skin/Wound Care: Routine skin checks 7. Fluids/Electrolytes/Nutrition: Routine in and outs with follow-up chemistries 8.  Seizure prophylaxis.  Keppra completed 9.  Hypertension.  Continue Norvasc 10 mg daily, hydralazine 20 mg every 8 hours, lisinopril 40 mg daily, labetalol 200 mg 3 times daily.  Monitor with increased mobility Vitals:   06/21/21 1945 06/22/21 0430  BP: (!) 143/74 (!)  141/78  Pulse: 69 70  Resp: 18 18  Temp: 98.2 F (36.8 C) 97.7 F (36.5 C)  SpO2: 98% 99%  controlled 7/11 10.  Hyperglycemia.  Hemoglobin A1c 5.7.  SSI CBG (last 3)  Recent Labs    06/21/21 2101 06/22/21 0618 06/22/21 1141  GLUCAP 93 98 116*   CBGs show no drop may d/c on 7/11  11.  Thalamic pain syndrome   7/8 c/o persistent pain--will increase gabapentin to 400-400-600mg  daily 7/11 increase to 400mg  QID 12. Increased tone on right side, quite well controlled but bothersome to patient: increase baclofen to 10mg  HS (also with insomnia)  LOS: 23 days A FACE TO FACE EVALUATION WAS  PERFORMED  Erick Colace 06/22/2021, 11:47 AM

## 2021-06-22 NOTE — Progress Notes (Signed)
Physical Therapy Session Note  Patient Details  Name: Marissa Morris MRN: [id number] Date of Birth: [birth year]  Today's Date: 06/22/2021 PT Individual Time: 1422-1600 PT Individual Time Calculation (min): 98 min   Short Term Goals: Week 2:  PT Short Term Goal 1 (Week 2): Pt will perform bed mobility with modA. PT Short Term Goal 1 - Progress (Week 2): Met PT Short Term Goal 2 (Week 2): Pt will perform bed to chair transfer with consistent Min A. PT Short Term Goal 2 - Progress (Week 2): Met PT Short Term Goal 3 (Week 2): Pt will perform sit to stand consistently with Mod A to RW. PT Short Term Goal 3 - Progress (Week 2): Met PT Short Term Goal 4 (Week 2): Pt will ambulate x25' with maxA and LRAD. PT Short Term Goal 4 - Progress (Week 2): Met PT Short Term Goal 5 (Week 2): Pt will initiate w/c mobility training. PT Short Term Goal 5 - Progress (Week 2): Met Week 3:  PT Short Term Goal 1 (Week 3): STG=LTG due to ELOS  Skilled Therapeutic Interventions/Progress Updates:  Patient seated on toilet on entrance to room. NT tending to pt. Pt's sisters and friend present in room for family education. NT using STEDY for toilet transfers and informed that pt is able to transfer with SPVT of one person assist with or without RW as long as dhe has handrails available. Safety Plan updated. Patient alert and agreeable to PT session. Patient c/o mild to moderate R knee pain during session. Pt's sisters relate that pt has had several images taken of knee as this has been a long standing issue.   Therapeutic Activity: Bed Mobility: Patient performed supine <> sit with supervision. Determined that pt will need hospital bed at sister's home as she will require some positioning assist for knee pain as well as bedrails to complete bed mobility. Minimal vc required for technique. Sisters and friend educated on minimal amount of assist required for bed mobility. Transfers: Patient performed STS transfers to RW  throughout session with close supervision from therapist and educated friend to be caregiver in providing lose supervision and light CGA and how to increase level of assist as needed for pt. SPVT transfers performed with CGA from friend with gait belt and with hands providing support at hips as needed and/ or at low back as needed. Provided vc/tc to friend for technique and safety as well as to pt re: minimal technique including reaching back for w/c prior to sit, pushing up from armrests and locking w/c brakes prior to transfers.  Gait Training:  Patient ambulated 175' x1/ 120' x1 using RW with CGA provided by friend/ caregiver and PT. Demonstrated correct technique to friend re: holding gait belt between thumb and forefinger as well as holding palms to pt's hips for balance/ safety. Provided vc/ tc for technique as well as instruction as to when additional assist may be needed and how to provide. Pt guided in improving gait technique with vc for maintaining smooth advancement of walker, stepping through with LLE and increasing step height of RLE. Overall quality improves throughout bouts with continued cueing. Friend and sister providing good cues throughout.   Guided pt and caregiver through stepping onto off of 4" curb step with use of RW. Requires heavy vc with instructions throughout for education to all on proper technique and performance. Pt is able to complete with CGA from friend and PT. VC especially for leading with LLE to ascend, RLE to descend,  and effort to maintain R knee extension in WB when stepping up with LLE.  Wheelchair Mobility:  Patient propelled wheelchair 100' x1 using RUE and BLE requiring vc for heel strike and bull with BLE. Sister and friend educated on pt's tendency to overpower straight path with use of LUE and catch of R arm between w/c and arm/ obstacle. But with use of RUE and BLE, pt is able to maintain straight path and maneuver with vc to assist. Both with good ability  to provide vc and encouragement.  Neuromuscular Re-ed: NMR facilitated during session with focus on standing balance. Pt guided in standing toe touches to 4" step prior to initiation of step navigation. Also guided in reaching during functional task at sink while washing hands. Pt able to maintain standing balance with no UE support and close supervision while reaching for soap, washing hands, then reaching for papertowels and tossing in waste bin.  NMR performed for improvements in motor control and coordination, balance, sequencing, judgement, and self confidence/ efficacy in performing all aspects of mobility at highest level of independence.   Patient seated upright in w/c at end of session with brakes locked, belt alarm set, sisters and friend present, and all needs within reach.     Therapy Documentation Precautions:  Precautions Precautions: Fall Precaution Comments: Rt hemiparesis with tone in UE/LE Restrictions Weight Bearing Restrictions: No  Therapy/Group: Individual Therapy  Alger Simons PT, DPT  06/22/2021, 1:02 PM

## 2021-06-22 NOTE — Progress Notes (Signed)
Speech Language Pathology Daily Session Note  Patient Details  Name: Marissa Morris MRN: [id number] Date of Birth: 11/20/1961  Today's Date: 06/22/2021 SLP Individual Time: 0900-1000 SLP Individual Time Calculation (min): 60 min  Short Term Goals: Week 3: SLP Short Term Goal 1 (Week 3): STGs=LTGs due to ELOS  Skilled Therapeutic Interventions: Patient agreeable to skilled ST intervention with focus on cognitive goals. Patient appeared more anxious today as she anticipates her upcoming discharge. She stated she feels like there is so much to be done and questions to be asked before she can consider discharging. SLP suggested using memory aids to record questions and "to-do" lists to allow her to better organize her thoughts. Patient agreeable and SLP facilitated use of patient's memory notebook to write down questions to ask prior to her discharge. For example, "will I still need to take my heperin injection when I get home?" Patient wrote down questions and details with min A verbal support to summarize/recall what was communicated earlier in conversation, and due to some hesitation on patient's behalf. Furthermore, patient utilized other compensatory memory strategies including her calendar for orienting to the date and year following initial verbal cue. Initially stated "I feel like it's 2017 but I don't think that's right." Lastly, SLP facilitated medication management & problem solving activity involving 1. Identification of medication organization errors in weekly pillbox, 2. Generating solutions for identified errors. Patient identified errors and generated solutions with 90% accuracy at supervision A verbal cues. She benefited from re-reading prescription instructions, additional processing time, and double checking pillbox for accuracy. She appeared hesitant and unsure of herself at times and benefited from verbal encouragement which she stated increased her confidence because she can be hard on  herself. Patient was left in wheelchair with alarm activated and needs within reach at end of session. Continue per ST plan of care.     Pain Pain Assessment Pain Scale: 0-10 Pain Score: 4  Faces Pain Scale: Hurts a little bit Pain Descriptors / Indicators: Aching Pain Intervention(s): Medication (See eMAR);Repositioned  Therapy/Group: Individual Therapy  Tamala Ser 06/22/2021, 9:10 AM

## 2021-06-22 NOTE — Progress Notes (Signed)
Patient ID: Marissa Morris, female   DOB: [birth year], 60 y.o.   MRN: [id number]  SW reached out to patient sister to see if there was a preference in PCP follow up in  [place], [place]. Patient sister requesting patient be set up at:  [facility] [address] [place], [place], [place] [address]  SW reached out to office to schedule appointment, facility requesting referral and patient records. Office will reach out to patient sister for scheduling once information is reviewed.    [phone]- Phone [phone]- Fax  [place] [place], [place] [phone]

## 2021-06-23 NOTE — Progress Notes (Signed)
PROGRESS NOTE   Subjective/Complaints: Patient feels like she is doing well she would like to stay another day or 2.  PT is in to work with her today.  No new issues.  We discussed her chronic knee osteoarthritis on the right side and that she cannot get another injection for at least a couple months Family training yesterday  ROS: Patient denies CP, SOB, N/V/D   Objective:   No results found. No results for input(s): WBC, HGB, HCT, PLT in the last 72 hours.  No results for input(s): NA, K, CL, CO2, GLUCOSE, BUN, CREATININE, CALCIUM in the last 72 hours.   Intake/Output Summary (Last 24 hours) at 06/23/2021 0903 Last data filed at 06/23/2021 0813 Gross per 24 hour  Intake 800 ml  Output --  Net 800 ml         Physical Exam: Vital Signs Blood pressure (!) 144/75, pulse 65, temperature 98.1 F (36.7 C), temperature source Oral, resp. rate 16, height 5\' 3"  (1.6 m), weight 73.2 kg, SpO2 98 %.   General: No acute distress Mood and affect are appropriate Heart: Regular rate and rhythm no rubs murmurs or extra sounds Lungs: Clear to auscultation, breathing unlabored, no rales or wheezes Abdomen: Positive bowel sounds, soft nontender to palpation, nondistended Extremities: No clubbing, cyanosis, or edema Skin: No evidence of breakdown, no evidence of rash  Cerebellar exam normal finger to nose to finger as well as heel to shin in bilateral upper and lower extremities Musculoskeletal: Full range of motion in all 4 extremities. No joint swelling   Neurologic: Cranial nerves II through XII intact, motor strength is 5/5 in left and 3- right deltoid, bicep, tricep, grip, hip flexor, knee extensors, ankle dorsiflexor and plantar flexor Sensation reduced  LT RUE and RLE, reduced R side of face without hypersensitivity or allodynia.   Musculoskeletal: no knee pain with PROM, no effusion  Assessment/Plan: 1. Functional deficits  which require 3+ hours per day of interdisciplinary therapy in a comprehensive inpatient rehab setting. Physiatrist is providing close team supervision and 24 hour management of active medical problems listed below. Physiatrist and rehab team continue to assess barriers to discharge/monitor patient progress toward functional and medical goals  Care Tool:  Bathing    Body parts bathed by patient: Right arm, Left arm, Chest, Abdomen, Face   Body parts bathed by helper: Front perineal area, Buttocks, Right upper leg, Right lower leg, Left lower leg     Bathing assist Assist Level: Independent with assistive device Assistive Device Comment: at sink from w/c level   Upper Body Dressing/Undressing Upper body dressing   What is the patient wearing?: Pull over shirt    Upper body assist Assist Level: Independent with assistive device Assistive Device Comment: from w/c level; able to retrieve shirt from drawer  Lower Body Dressing/Undressing Lower body dressing      What is the patient wearing?: Pants     Lower body assist Assist for lower body dressing: Minimal Assistance - Patient > 75%     Toileting Toileting Toileting Activity did not occur (Clothing management and hygiene only): N/A (no void or bm)  Toileting assist Assist for toileting: Minimal Assistance -  Patient > 75%     Transfers Chair/bed transfer  Transfers assist     Chair/bed transfer assist level: Contact Guard/Touching assist     Locomotion Ambulation   Ambulation assist      Assist level: Minimal Assistance - Patient > 75% Assistive device: Walker-rolling Max distance: 50'   Walk 10 feet activity   Assist  Walk 10 feet activity did not occur: Safety/medical concerns  Assist level: Minimal Assistance - Patient > 75% Assistive device: Walker-rolling   Walk 50 feet activity   Assist Walk 50 feet with 2 turns activity did not occur: Safety/medical concerns  Assist level: Minimal Assistance  - Patient > 75% Assistive device: Walker-rolling    Walk 150 feet activity   Assist Walk 150 feet activity did not occur: Safety/medical concerns         Walk 10 feet on uneven surface  activity   Assist Walk 10 feet on uneven surfaces activity did not occur: Safety/medical concerns         Wheelchair     Assist Will patient use wheelchair at discharge?: No Type of Wheelchair: Manual    Wheelchair assist level: Minimal Assistance - Patient > 75% Max wheelchair distance: 25    Wheelchair 50 feet with 2 turns activity    Assist        Assist Level: Minimal Assistance - Patient > 75%   Wheelchair 150 feet activity     Assist          Blood pressure (!) 144/75, pulse 65, temperature 98.1 F (36.7 C), temperature source Oral, resp. rate 16, height 5\' 3"  (1.6 m), weight 73.2 kg, SpO2 98 %.   Medical Problem List and Plan: 1.  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  Status post right frontal ventriculostomy for hydrocephalus             -patient may  shower             -ELOS/Goals: 7/14 cont CIR PT, OT, SLP   -Continue CIR team conference in a.m. 2.  Impaired mobility, ambulating 100 feet -DVT/anticoagulation: Continue Subcutaneous heparin initiated 05/18/2021             -antiplatelet therapy: N/A 3. 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 Mood: Provigil 100 mg daily             -antipsychotic agents: N/A 5. Neuropsych: This patient is capable of making decisions on her own behalf. 6. Skin/Wound Care: Routine skin checks 7. Fluids/Electrolytes/Nutrition: Routine in and outs with follow-up chemistries 8.  Seizure prophylaxis.  Keppra completed 9.   Hypertension.  Continue Norvasc 10 mg daily, hydralazine 20 mg every 8 hours, lisinopril 40 mg daily, labetalol 200 mg 3 times daily.  Monitor with increased mobility Vitals:   06/22/21 2252 06/23/21 0518  BP: 114/64 (!) 144/75  Pulse: 67 65  Resp: 18 16  Temp:  98.1 F (36.7 C)  SpO2:  98%  controlled 7/12 10.  Hyperglycemia.  Hemoglobin A1c 5.7.  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 Increased tone on right side, quite well controlled but bothersome to patient: increase baclofen to 10mg  HS (also with insomnia)  LOS: 24 days A FACE TO FACE EVALUATION WAS PERFORMED  06/23/2021, 9:03 AM

## 2021-06-23 NOTE — Progress Notes (Signed)
Occupational Therapy Discharge Summary  Patient Details  Name: Marissa Morris MRN: [id number] Date of Birth: 07-20-1961  Today's Date: 06/24/2021 OT Individual Time: 3825-0539 OT Individual Time Calculation (min): 55 min + 32 min   Patient has met 8 of 8 long term goals due to improved activity tolerance, improved balance, postural control, ability to compensate for deficits, functional use of  RIGHT upper extremity, improved attention, improved awareness, and improved coordination.  Patient to discharge at [facility] level.  Patient's care partner unavailable to provide the necessary physical and cognitive assistance at discharge.    Reasons goals not met: NA  Recommendation:  Patient will benefit from ongoing skilled OT services in home health setting to continue to advance functional skills in the area of BADL and Reduce care partner burden.  Equipment: No equipment provided  Reasons for discharge: treatment goals met and discharge from hospital  Patient/family agrees with progress made and goals achieved: Yes  OT Discharge Precautions/Restrictions  Precautions Precautions: Fall Precaution Comments: Rt hemiparesis Restrictions Weight Bearing Restrictions: No Pain Pain Assessment Pain Scale: 0-10 ADL ADL Eating: Independent Where Assessed-Eating: Wheelchair Grooming: Modified independent Where Assessed-Grooming: Sitting at sink Upper Body Bathing: Modified independent Where Assessed-Upper Body Bathing: Sitting at sink Lower Body Bathing: Minimal assistance Where Assessed-Lower Body Bathing: Standing at sink Upper Body Dressing: Modified independent (Device) Where Assessed-Upper Body Dressing: Sitting at sink, Wheelchair Lower Body Dressing: Minimal assistance Where Assessed-Lower Body Dressing: Standing at sink Toileting: Minimal assistance Where Assessed-Toileting: Glass blower/designer: Psychiatric nurse Method: Stand pivot, Pensions consultant: Grab bars, Raised toilet seat Tub/Shower Transfer: Minimal assistance Tub/Shower Transfer Method: Squat pivot, Stand pivot Tub/Shower Equipment: Grab bars, Facilities manager: Not assessed ADL Comments: Functional transfers unsafe to attempt during eval as no +2 assist was available Vision Baseline Vision/History: No visual deficits Patient Visual Report: Blurring of vision (onset of blurriness in R eye since last week) Vision Assessment?: Yes Eye Alignment: Within Functional Limits Ocular Range of Motion: Within Functional Limits Alignment/Gaze Preference: Within Defined Limits Tracking/Visual Pursuits: Decreased smoothness of horizontal tracking;Decreased smoothness of vertical tracking;Requires cues, head turns, or add eye shifts to track Saccades: Additional eye shifts occurred during testing;Decreased speed of saccadic movement;Additional head turns occurred during testing Convergence: Impaired (comment) Visual Fields: No apparent deficits Perception  Perception: Within Functional Limits Praxis Praxis: Impaired Praxis Impairment Details: Motor planning Praxis-Other Comments: Intermittent issues with motor planning that are much improved from eval Cognition Overall Cognitive Status: Impaired/Different from baseline Arousal/Alertness: Awake/alert Orientation Level: Oriented X4 Attention: Sustained Sustained Attention: Impaired Sustained Attention Impairment: Verbal complex;Functional complex Memory: Impaired Memory Impairment: Retrieval deficit Immediate Memory Recall: Sock;Blue;Bed Memory Recall Sock: Not able to recall Memory Recall Blue: Without Cue Memory Recall Bed: Not able to recall Problem Solving: Impaired Behaviors: Lability;Impulsive Safety/Judgment: Impaired Comments: Cont to req min VCs for safety during functional transfers, greatly improved from eval. Labile at times 2/2  pain. Sensation Sensation Light Touch: Impaired Detail Peripheral sensation comments: Unable to identify LT accurately to R digits. Light Touch Impaired Details: Impaired RUE;Impaired RLE Hot/Cold: Appears Intact Proprioception: Impaired by gross assessment Stereognosis: Impaired Detail Additional Comments: Mild R hemiparesis Coordination Gross Motor Movements are Fluid and Coordinated: No Fine Motor Movements are Fluid and Coordinated: No Coordination and Movement Description: Affected by Rt hemiparesis as well as balance deficits Finger Nose Finger Test: Mild dysmetria on BUE, overshoots target on R 9 Hole Peg Test: R hand: 37 seconds; L: 28  seconds Motor  Motor Motor: Hemiplegia;Abnormal postural alignment and control Motor - Discharge Observations: mild R hemiparesis, scoliotic posture at baseline Mobility  Bed Mobility Bed Mobility: Supine to Sit;Sit to Supine Supine to Sit: Supervision/Verbal cueing Sit to Supine: Supervision/Verbal cueing Transfers Sit to Stand: Supervision/Verbal cueing Stand to Sit: Supervision/Verbal cueing  Trunk/Postural Assessment  Cervical Assessment Cervical Assessment: Exceptions to [facility] (forward head) Thoracic Assessment Thoracic Assessment: Exceptions to [facility] (rounded shoulders, scoliosis) Lumbar Assessment Lumbar Assessment: Exceptions to [facility] (posterior pelvic tilt) Postural Control Postural Control: Deficits on evaluation (decreased with dynamic standing)  Balance Balance Balance Assessed: Yes Static Sitting Balance Static Sitting - Balance Support: Feet supported Static Sitting - Level of Assistance: 6: Modified independent (Device/Increase time) Dynamic Sitting Balance Dynamic Sitting - Balance Support: During functional activity;Feet unsupported Dynamic Sitting - Level of Assistance: 5: Stand by assistance Dynamic Sitting Balance - Compensations: S Static Standing Balance Static Standing - Balance Support: During functional  activity Static Standing - Level of Assistance: 5: Stand by assistance Static Standing - Comment/# of Minutes: CGA Dynamic Standing Balance Dynamic Standing - Balance Support: During functional activity;Bilateral upper extremity supported Dynamic Standing - Level of Assistance: 4: Min assist Extremity/Trunk Assessment RUE Assessment RUE Assessment: Exceptions to [facility] Active Range of Motion (AROM) Comments: 3/4 to full shoulder flexion General Strength Comments: 4/5 in shoulder flexion, 12 lb grip strength RUE Body System: Neuro Brunstrum levels for arm and hand: Arm;Hand Brunstrum level for arm: Stage IV Movement is deviating from synergy Brunstrum level for hand: Stage V Independence from basic synergies LUE Assessment LUE Assessment: Within Functional Limits   Session 1 ([id number]) : Pt received semi-reclined in bed finishing breakfast, agreeable to therapy. Cont to c/o ongoing RUE/RLE numbness and generalized pain. Session focus on self-care retraining, activity tolerance, functional mobility in prep for improved ADL/IADL/func mobility performance + decreased caregiver burden. Came to sitting EOB close S with use of bed rail. Squat-pivot to w/c with CGA. Stand-pivot to and from toilet with use of grab bar and CGA for balance. Min A for balance during pericare and LB clothing management in standing post cont void of bladder. Vcs for safety and for keeping BLE under her prior to standing up. Completed UBD and UBB at sink with mod I seated in w/c. Therapeutic use of self utilized as pt becoming anxious re going home with ongoing R sided pain/numbness, reminded pt of use of heat and avoided hard surfaces has helped her manage her pain thus far. Donned pants with min A for STS balance and min Vcs for safety/technique, as pt tends to rush when she's anxious. Donned B shoes, teds, socks, and R air cast with close S and increased time. Pt able to write down 3 activities with S in memory notebook, rates  handwriting close to 100% accuracy compared prior to stroke, but relates significant hand fatigue with writing. Provided various adult coloring sheets to promote R [facility] and relaxation.    Pt left seated in w/c with safety belt alarm engaged, LPN present, call bell in reach, and all immediate needs met.    Session 2 [phone]): Pt received seated in w/c with family present, no c/o pain, agreeable to therapy. Session focus on self-care retraining, activity tolerance, RUE/RLE NMR, dynamic standing balance in prep for improved ADL/IADL/func mobility performance + decreased caregiver burden. Total A transport to and from gym 2/2 time management and energy conservation. Stood to complete visuocontstructive task with tube design, retrieving pieces from R knee level. CGA for balance  with use of RW. Mod A for accurate completion of design. Amb t/f to and from w/c ~ for 5 ft with min A for balance with use of RW. Stand-pivot to toilet CGA, with min A for balance during LB clothing management. Cont void of bladder, close S for seated pericare. Mod I to wash hands seated at sink.  Pt left seated in w/c with SLP present with safety belt alarm engaged, call bell in reach, and all immediate needs met.    Volanda Napoleon MS, OTR/L  06/23/2021, 2:54 PM

## 2021-06-23 NOTE — Progress Notes (Signed)
Patient ID: Marissa Morris, female   DOB: 1961/02/11, 60 y.o.   MRN: [id number]  Patient DME ordered through [employer]:  Transport Chair, Shower Chair, [employer], [employer]. SW waiting for standard WC sizing.  [place], [place] [phone]

## 2021-06-23 NOTE — Progress Notes (Signed)
Physical Therapy Session Note  Patient Details  Name: Marissa Morris MRN: [id number] Date of Birth: [birth year]  Today's Date: 06/23/2021 PT Individual Time: 0915-1003 PT Individual Time Calculation (min): 48 min   Short Term Goals: Week 2:  PT Short Term Goal 1 (Week 2): Pt will perform bed mobility with modA. PT Short Term Goal 1 - Progress (Week 2): Met PT Short Term Goal 2 (Week 2): Pt will perform bed to chair transfer with consistent Min A. PT Short Term Goal 2 - Progress (Week 2): Met PT Short Term Goal 3 (Week 2): Pt will perform sit to stand consistently with Mod A to RW. PT Short Term Goal 3 - Progress (Week 2): Met PT Short Term Goal 4 (Week 2): Pt will ambulate x25' with maxA and LRAD. PT Short Term Goal 4 - Progress (Week 2): Met PT Short Term Goal 5 (Week 2): Pt will initiate w/c mobility training. PT Short Term Goal 5 - Progress (Week 2): Met Week 3:  PT Short Term Goal 1 (Week 3): STG=LTG due to ELOS  Skilled Therapeutic Interventions/Progress Updates:  Patient seated upright in w/c on entrance to room. Patient alert and agreeable to PT session. Patient denied pain during session, but related soreness in head at site of operation and some fatigue.    Therapeutic Activity: Transfers: Patient performed STS transfer with close supervision to RW and SPVT transfers throughout session with  CGA with minimal vc for safe technique.  Despite family and pt indicating ownership of all DME items required for d/c home during family education session yesterday, SW indicates that family states all equipment is needed. Current w/c measured for correct sizing for personal w/c and measurements provided to SW. BSC, shower chair, and RW all arrive in afternoon. RW to be adjusted to pt's height in next session.   Gait Training:  Patient ambulated 60 feet using RW down uneven pavement on ramped surface requiring Min A/ CGA to control RW speed throughout. Extensive seated rest break required  prior to initiating ascent. In ascent pt demos adequate RLE foot clearance throughout return distance. Ambulation over uneven surfaces using RW with good technique and mgmt of walker over cracks in ground surface. Provided vc for maintaining adequate RLE step height for good foot clearance.   Discussion with NT re: pt's upgrade to ambulation to toilet using RW if pt indicates that urgency is not immediate. Pt able to ambulate with RW to toilet or perform SPVT using handrails at toilet but she has progressed from use of STEDY during daytime hours.   Patient seated  in w/c at end of session with brakes locked, belt alarm set, and all needs within reach. SLP taking over therapy session.     Therapy Documentation Precautions:  Precautions Precautions: Fall Precaution Comments: Rt hemiparesis with tone in UE/LE Restrictions Weight Bearing Restrictions: No  Therapy/Group: Individual Therapy  Alger Simons PT, DPT  06/23/2021, 12:47 PM

## 2021-06-23 NOTE — Progress Notes (Addendum)
Patient ID: Marissa Morris, female   DOB: [birth year], 60 y.o.   MRN: [id number]  Patient [facility] referral faxed to:   [facility] [place] [facility]), [facility], [facility], [facility] and [facility].   Referrals sent to [facility] ([facility]my) and [facility])  [place], [place] [phone]  *Declines [facility] declined due to service area [facility] care declined due to insurance [facility] declined due to insurance  7/13: [facility] due to insurance

## 2021-06-23 NOTE — Discharge Summary (Signed)
Physician Discharge Summary  Patient ID: Lavaughn Bisig MRN: [id number] DOB/AGE: [birth year] 60 y.o.  Admit date: 05/30/2021 Discharge date: 06/25/2021  Discharge Diagnoses:  Principal Problem:   Thalamic hemorrhage [facility]) DVT prophylaxis Mood stabilization Seizure prophylaxis Hypertension Thalamic pain syndrome  Discharged Condition: Stable  Significant Diagnostic Studies: DG Knee 1-2 Views Right  Result Date: 05/31/2021 CLINICAL DATA:  Knee pain no injury EXAM: RIGHT KNEE - 1-2 VIEW COMPARISON:  None. FINDINGS: Advanced tricompartmental degenerative change with joint space narrowing and extensive spurring in all 3 compartments. Moderate joint effusion. Negative for fracture. IMPRESSION: Advanced tricompartmental degenerative change.  Joint effusion. Electronically Signed   By: Marlan Palau M.D.   On: 05/31/2021 13:22   CT HEAD WO CONTRAST  Result Date: 05/26/2021 CLINICAL DATA:  Intracranial hemorrhage, follow-up EXAM: CT HEAD WITHOUT CONTRAST TECHNIQUE: Contiguous axial images were obtained from the base of the skull through the vertex without intravenous contrast. COMPARISON:  05/21/2021 FINDINGS: Brain: Interval removal of right frontal approach ventricular catheter. There is minimal postprocedural air in the left lateral ventricle. Intraventricular hemorrhage has significantly decreased. Ventricle caliber is similar. Evolving hemorrhage centered within the left thalamus as again identified with similar surrounding edema. Similar mass effect including effacement of the third ventricle and adjacent left lateral ventricle. Stable findings of chronic microvascular ischemic changes. 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 Vascular: No new finding. Skull: Calvarium is unremarkable apart from small right frontal burr hole. Sinuses/Orbits: No acute finding. Other: None. IMPRESSION: Interval removal of ventricular catheter. Significantly decreased intraventricular  hemorrhage. Ventricle caliber is similar. Evolving left thalamic hemorrhage and edema. No new hemorrhage or worsening of mass effect. Electronically Signed   By: Guadlupe Spanish M.D.   On: 05/26/2021 12:50   DG Swallowing Func-Speech Pathology  Result Date: 05/27/2021 Formatting of this result is different from the original. Objective Swallowing Evaluation: Type of Study: MBS-Modified Barium Swallow Study  Patient Details Name: Dewey Neukam MRN: [id number] Date of Birth: [birth year] Today's Date: 05/27/2021 Time: SLP Start Time (ACUTE ONLY): 0925 -SLP Stop Time (ACUTE ONLY): 0945 SLP Time Calculation (min) (ACUTE ONLY): 20 min Past Medical History: No past medical history on file. Past Surgical History: No past surgical history on file. HPI: Lindamarie Schlotterbeck is a 60 y.o. female presenting to [facility] in transfer from [facility] for management of acute intracranial hemorrhage. 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 Moderate chronic microvascular ischemic changes. No PMH on file.  Subjective: alert, cooperative Assessment / Plan / Recommendation CHL IP CLINICAL IMPRESSIONS 05/27/2021 Clinical Impression Pt presented with resolved dysphagia and a normal oropharyngeal swallow.  There was adequate oral control and bolus manipulation/propulsion, timely swallow trigger, reliable laryngeal vestibule closure, no penetration/aspiration, good pharyngeal stripping with no residue post-swallow.  When taxed with mixed solid/thin liquid consistencies, pt continued to protect her airway.  She swallowed a 13 mm barium pill with liquid without difficulty.  There is no reason to maintain cortrak at this point.  Recommend D/C TF and initiate a regular diet with thin liquids. D/W Dr. Radonna Ricker. SLP Visit  Diagnosis Dysphagia, unspecified (R13.10) Attention and concentration deficit following -- Frontal lobe and executive function deficit following -- Impact on safety and function No limitations   CHL IP TREATMENT RECOMMENDATION 05/27/2021 Treatment Recommendations Therapy as outlined in treatment plan  below   Prognosis 05/15/2021 Prognosis for Safe Diet Advancement Good Barriers to Reach Goals -- Barriers/Prognosis Comment -- CHL IP DIET RECOMMENDATION 05/27/2021 SLP Diet Recommendations Regular solids;Thin liquid Liquid Administration via Straw;Cup Medication Administration Whole meds with liquid Compensations Minimize environmental distractions Postural Changes --   CHL IP OTHER RECOMMENDATIONS 05/27/2021 Recommended Consults -- Oral Care Recommendations Oral care BID Other Recommendations --   CHL IP FOLLOW UP RECOMMENDATIONS 05/27/2021 Follow up Recommendations Inpatient Rehab   CHL IP FREQUENCY AND DURATION 05/27/2021 Speech Therapy Frequency (ACUTE ONLY) min 2x/week Treatment Duration 2 weeks      CHL IP ORAL PHASE 05/27/2021 Oral Phase WFL Oral - Pudding Teaspoon -- Oral - Pudding Cup -- Oral - Honey Teaspoon -- Oral - Honey Cup -- Oral - Nectar Teaspoon -- Oral - Nectar Cup -- Oral - Nectar Straw -- Oral - Thin Teaspoon -- Oral - Thin Cup -- Oral - Thin Straw -- Oral - Puree -- Oral - Mech Soft -- Oral - Regular -- Oral - Multi-Consistency -- Oral - Pill -- Oral Phase - Comment --  CHL IP PHARYNGEAL PHASE 05/27/2021 Pharyngeal Phase WFL Pharyngeal- Pudding Teaspoon -- Pharyngeal -- Pharyngeal- Pudding Cup -- Pharyngeal -- Pharyngeal- Honey Teaspoon -- Pharyngeal -- Pharyngeal- Honey Cup -- Pharyngeal -- Pharyngeal- Nectar Teaspoon -- Pharyngeal -- Pharyngeal- Nectar Cup -- Pharyngeal -- Pharyngeal- Nectar Straw -- Pharyngeal -- Pharyngeal- Thin Teaspoon -- Pharyngeal -- Pharyngeal- Thin Cup -- Pharyngeal -- Pharyngeal- Thin Straw -- Pharyngeal -- Pharyngeal- Puree -- Pharyngeal -- Pharyngeal- Mechanical Soft --  Pharyngeal -- Pharyngeal- Regular -- Pharyngeal -- Pharyngeal- Multi-consistency -- Pharyngeal -- Pharyngeal- Pill -- Pharyngeal -- Pharyngeal Comment --  No flowsheet data found. Blenda Mounts Laurice 05/27/2021, 9:52 AM               Labs:  Basic Metabolic Panel: No results for input(s): NA, K, CL, CO2, GLUCOSE, BUN, CREATININE, CALCIUM, MG, PHOS in the last 168 hours.  CBC: No results for input(s): WBC, NEUTROABS, HGB, HCT, MCV, PLT in the last 168 hours.  CBG: Recent Labs  Lab 06/21/21 1629 06/21/21 2101 06/22/21 0618 06/22/21 1141 06/22/21 1556  GLUCAP 101* 93 98 116* 84    Brief HPI:   Aliviya Byrer is a 60 y.o. right-handed female with history of hypertension as well as back surgery.  Per chart review lives alone 1 level home ramped entrance.  Reportedly independent prior to admission.  She has a supportive fianc.  Presented 05/14/2021 on transfer from [facility] after presenting with right side weakness nausea vomiting facial droop and dysarthria.  Noted blood pressure 222/128.  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  Neurosurgery Dr. Hoyt Koch consulted underwent right frontal ventriculostomy for hydrocephalus.  Patient was managed with hydralazine and nicardipine for blood pressure control.  Keppra initiated for seizure prophylaxis and course completed.  Follow-up MRI/MRA showed no substantial change in thalamic hemorrhage with significant intraventricular extension mild hydrocephalus as well as mild edema and mass-effect compared to recent CT.  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  MRA was unremarkable and CT head 05/26/2021 showing interval removal of ventricular catheter with significant decrease in intraventricular hemorrhage.  No new hemorrhage or worsening of mass-effect.  She was cleared to begin subcutaneous heparin for DVT prophylaxis 05/18/2021.  Echocardiogram  with ejection fraction of 50 to 55% grade 2 diastolic dysfunction.  Admission chemistries unremarkable except glucose 117 WBC 12,800 urine  drug screen negative.  Patient maintained on Cleviprex initially for blood pressure control.  She was initially n.p.o. with alternative means of nutritional support and diet slowly advanced.  Bouts of initial agitation restlessness receiving Zyprexa via.  Therapy evaluations completed due to patient's right side weakness and dysarthria was admitted for a comprehensive rehab program.   Hospital Course: Melora Tokarski was admitted to rehab 05/30/2021 for inpatient therapies to consist of PT, ST and OT at least three hours five days a week. Past admission physiatrist, therapy team and rehab RN have worked together to provide customized collaborative inpatient rehab.  Pertaining to patient's left thalamic hemorrhage due to hypertensive crisis remained stable status post right frontal ventriculostomy for hydrocephalus.  Patient would follow-up neurosurgery as well as neurology services.  Patient with noted thalamic pain syndrome maintained on Neurontin with good results.  Increased tone on right side with good benefit of baclofen.  Subcutaneous heparin initiated for DVT prophylaxis 05/18/2021 later discontinued ambulating 100 feet.  Right knee pain suspect osteoarthritis tricompartmental osteoarthritis placed on Voltaren gel.  She did receive steroid injection to the right knee 06/12/2021 with some benefit.  Mood stabilization with the use of Provigil.  She was attending therapies.  Seizure prophylaxis with course of Keppra completed.  Blood pressure controlled on Norvasc, hydralazine as well as labetalol and lisinopril patient would need outpatient follow-up.   Blood pressures were monitored on TID basis and controlled     Rehab course: During patient's stay in rehab weekly team conferences were held to monitor patient's progress, set goals and discuss barriers to discharge. At  admission, patient required max assist upper body bathing total assist lower body bathing max assist upper body dressing max assist lower body dressing.  Max assist side-lying to sitting max assist sit to stand.  Physical exam.  Blood pressure 120/81 pulse 67 temperature 98.3 respirations 18 oxygen saturations 98% room air Constitutional.  No acute distress HEENT Head.  Normocephalic and atraumatic Eyes.  Pupils round and reactive to light no discharge.nystagmus Neck.  Supple nontender no JVD without thyromegaly Cardiac regular rate rhythm without extra sounds or murmur heard Abdomen.  Soft nontender positive bowel sounds without rebound Respiratory effort normal no respiratory distress without wheeze Neurologic.  Patient was alert made eye contact with examiner pleasantly confused.  Right upper extremity 0/5 in the deltoid bicep tricep grip, left upper extremity 4/5 in the left deltoid bicep tricep grip Right lower extremity minus at the hip flexor knee extensor 0 at the ankle dorsiflexor, left lower extremity 4 - hip flexor knee extensor ankle dorsiflexion Sensation reduced on the right side.  He/She  has had improvement in activity tolerance, balance, postural control as well as ability to compensate for deficits. He/She has had improvement in functional use RUE/LUE  and RLE/LLE as well as improvement in awareness.  Patient can require supervision for bed mobility and contact-guard for stand pivot transfers from edge of bed-wheelchair with no assistive device.  Patient completed washed up from sink with patient able to complete upper body bathing independently with assistive device.  Patient completed upper body dressing oral care independently with assistive device.  Lower body dressing minimal assist needed minimal assist for standing balance palpation pull pants up to waist line.  Patient needed set up a footwear but able to don ankle Aircast shoes and socks from wheelchair level.  Stand pivot  transfer to wheelchair rolling walker contact-guard assist.  Ambulates 50 feet x 2 rolling walker minimal assist.  Speech therapy  follow-up for any cognitive issues patient did write down questions in details with minimal assist verbal support to summarize recall and what was communicated in her conversations.  Patient identified areas in generalized solutions with 90% accuracy and supervision assist verbal cues.  It was discussed with family need for supervision for safety on discharge.  Full family teaching completed and discharged to home       Disposition: Discharged to home    Diet: Regular  Special Instructions: No driving smoking or alcohol  Medications at discharge 1.  Tylenol as needed 2.  Norvasc 10 mg p.o. daily 3.  Baclofen 10 mg p.o. nightly 4.  Voltaren gel 2 g 4 times daily to affected area 5.  Neurontin 400 mg 3 times daily daily with meals and bedtime 6.  Hydralazine 20 mg every 8 hours 7.  Labetalol 200 mg p.o. 3 times daily 8.  Lisinopril 40 mg p.o. daily 9.  Provigil 100 mg p.o. daily 10.  Senokot S1 tablet p.o. twice daily  30-35 minutes were spent completing discharge summary and discharge planning  Discharge Instructions     Ambulatory referral to Neurology   Complete by: As directed    An appointment is requested in approximately: 4 weeks thalamic hemorrhage   Ambulatory referral to Physical Medicine Rehab   Complete by: As directed    Moderate complexity follow-up 1 to 2 weeks left thalamic hemorrhage        Follow-up Information     Kirsteins, Victorino SparrowAndrew E, MD Follow up.   Specialty: Physical Medicine and Rehabilitation Why: Office to call for appointment Contact information: [address] [place] [address] [phone]                 Signed: Charlton AmorDaniel J Jahnyla Parrillo 06/25/2021, 5:32 AM

## 2021-06-23 NOTE — Progress Notes (Signed)
Speech Language Pathology Daily Session Note  Patient Details  Name: Marissa Morris MRN: [id number] Date of Birth: August 19, 1961  Today's Date: 06/23/2021 SLP Individual Time: 1000-1030 SLP Individual Time Calculation (min): 30 min  Short Term Goals: Week 3: SLP Short Term Goal 1 (Week 3): STGs=LTGs due to ELOS  Skilled Therapeutic Interventions: Patient agreeable for skilled ST treatment with focus on cognitive goals. SLP facilitated continuation memory notebook, specifically utilizing "to-do" list page that was developed during yesterday's session to record questions and things that may need to be accomplished prior to discharge. For example, today patient stated she needs to ensure she has briefs ordered/picked up prior to returning home. Patient required min A verbal cues to initiate writing information on paper and for thought organization. Sup A verbal cues to remind patient that she wrote down certain topics in her notebook. Patient reported feeling "over anxious" and it's hard to think clearly when this occurs. SLP educated patient on mindful breathing and facilitated deep breathing exercise (inhale for 3, exhale for 4). Patient returned demonstration effectively during 5/5 occasions and reported "I think that helps."Patient was left in her wheelchair with alarm activated and needs within reach at end of session. Continue per ST plan of care.     Pain Pain Assessment Pain Scale: 0-10 Pain Score: 4  Pain Type: Acute pain Pain Location: Leg Pain Orientation: Right Pain Descriptors / Indicators: Aching Pain Intervention(s): Medication (See eMAR)  Therapy/Group: Individual Therapy  Tamala Ser 06/23/2021, 10:25 AM

## 2021-06-23 NOTE — Progress Notes (Signed)
Occupational Therapy Session Note  Patient Details  Name: Marissa Morris MRN: [id number] Date of Birth: 05/27/1961  Today's Date: 06/23/2021 OT Individual Time: 8675-4492 OT Individual Time Calculation (min): 70 min    Short Term Goals: Week 4:  OT Short Term Goal 1 (Week 4): STG = LTG 2/2 ELOS  Skilled Therapeutic Interventions/Progress Updates:    Session 1 (0100-7121): Pt received semi-reclined in bed, no c/o pain initially, agreeable to therapy. Session focus on self-care retraining, activity tolerance, RUE NMR in prep for improved ADL/IADL/func mobility performance + decreased caregiver burden. Pt excitedly reports she had donned her own teds and B socks with ind. Came to sitting EOB with close S and use of bed rail. Squat-pivot to w/c with CGA. Completed oral care + UBB + UBD with mod I in w/c at sink. Stand-pivot with CGA to and from toilet with use of grab bar, CGA for toileting tasks, no void. Did req Vcs to wait for therapist assist prior to standing. Donned pants with light min A for balance and no UE support. Donned B shoes and R air cast with light min A to adjust shoe over R heel. Self-propelled self to and from gym with increased time and close S, noted to be easily distracted by busy environment.  Reassessed grip strength and readministered 9HPT in prep for DC this week with the below results: no significant improvement noted since last administration. R: 11, 11, 14; average of 12 lbs; 37 secs   Pt left seated in w/c with safety belt alarm engaged, call bell in reach, and all immediate needs met.     Session 2 [phone]): Pt received seated in w/c, agreeable to therapy. Session focus on dynamic standing balance, activity tolerance, RUE NMR in prep for improved ADL/IADL/func mobility performance + decreased caregiver burden. Total A w/c transport to and from gym 2/2 time management and energy conservation. Stand-pivot with RW from w/c <> mat with min A for balance and mod Vcs for  technique.Transitioned to supine to complete 1x15 of the following with 2 lb dowel rod: push-ups, chest press, B shoulder flexion. Transitioned to prone and complete 1x15 of modified cobra push-ups, but unable to tolerate prone 2/2 increase RUE/RLE pain. Supplied heating pad and transitioned to sitting edge of mat with relief. Stood at elevated mat and completed 1x15 standing push-ups and forward slides with min A to block R knee. Finally, practiced tossing ball into various target bins with use of RUE only. Demonstrates decreased trunk compensation this take during R shoulder flexion   Pt left seated in w/c with safety belt alarm engaged, call bell in reach, and all immediate needs met.    Therapy Documentation Precautions:  Precautions Precautions: Fall Precaution Comments: Rt hemiparesis with tone in UE/LE Restrictions Weight Bearing Restrictions: No  Pain: see session notes ADL: See Care Tool for more details.  Therapy/Group: Individual Therapy  Volanda Napoleon MS, OTR/L  06/23/2021, 6:41 AM

## 2021-06-24 MED ORDER — HYDRALAZINE HCL 10 MG PO TABS: 20.0000 mg | ORAL_TABLET | Freq: Three times a day (TID) | ORAL | 0 refills | Status: AC

## 2021-06-24 MED ORDER — MODAFINIL 100 MG PO TABS: 100.0000 mg | ORAL_TABLET | Freq: Every day | ORAL | 0 refills | Status: DC

## 2021-06-24 MED ORDER — ACETAMINOPHEN 325 MG PO TABS: 650.0000 mg | ORAL_TABLET | Freq: Four times a day (QID) | ORAL | Status: AC | PRN

## 2021-06-24 MED ORDER — GABAPENTIN 400 MG PO CAPS: 400.0000 mg | ORAL_CAPSULE | Freq: Three times a day (TID) | ORAL | 0 refills | Status: DC

## 2021-06-24 MED ORDER — DICLOFENAC SODIUM 1 % EX GEL: 2.0000 g | Freq: Four times a day (QID) | CUTANEOUS | 0 refills | Status: AC

## 2021-06-24 MED ORDER — LISINOPRIL 20 MG PO TABS: 40.0000 mg | ORAL_TABLET | Freq: Every day | ORAL | 0 refills | Status: DC

## 2021-06-24 MED ORDER — BACLOFEN 10 MG PO TABS: 10.0000 mg | ORAL_TABLET | Freq: Every day | ORAL | 0 refills | Status: AC

## 2021-06-24 MED ORDER — SENNOSIDES-DOCUSATE SODIUM 8.6-50 MG PO TABS: 1.0000 | ORAL_TABLET | Freq: Two times a day (BID) | ORAL | Status: DC

## 2021-06-24 MED ORDER — LABETALOL HCL 200 MG PO TABS: 200.0000 mg | ORAL_TABLET | Freq: Three times a day (TID) | ORAL | 0 refills | Status: AC

## 2021-06-24 MED ORDER — AMLODIPINE BESYLATE 10 MG PO TABS: 10.0000 mg | ORAL_TABLET | Freq: Every day | ORAL | 0 refills | Status: AC

## 2021-06-24 NOTE — Progress Notes (Addendum)
Patient ID: Marissa Morris, female   DOB: [birth year], 60 y.o.   MRN: [id number]  SW informed by [employer] of not being able to fulfill patient wheelchair and hospital bed order due to service area.   Pt new DME orders sent to [facility] in [place], [place] for Home Medical Equipment and Supplies.  Hospital Bed and Wheelchair orders sent. SW informed pt sister to be expecting a phone call in reference to delivery.   Confirmed delivery address: [address] [place], [place] [address]  P: [phone] F: [phone]  Lavera Guise, BSW [phone]

## 2021-06-24 NOTE — Progress Notes (Signed)
Patient ID: Marissa Morris, female   DOB: 05/22/1961, 60 y.o.   MRN: [id number] Team Conference Report to Patient/Family  Team Conference discussion was reviewed with the patient and caregiver, including goals, any changes in plan of care and target discharge date.  Patient and caregiver express understanding and are in agreement.  The patient has a target discharge date of 06/25/21.  SW informed pt and sister of updates. Pt on target with all goals. Goals met with PT and ST. Family edu complete. On track for discharge tomorrow. Family will be present tomorrow between 12-1 PM for discharge  [name] 06/24/2021, 11:28 AM

## 2021-06-24 NOTE — Plan of Care (Signed)
  Problem: RH Cognition - SLP Goal: RH LTG Patient will demonstrate orientation with cues Description:  LTG:  Patient will demonstrate orientation to person/place/time/situation with cues (SLP)   Outcome: Completed/Met   Problem: RH Comprehension Communication Goal: LTG Patient will comprehend basic/complex auditory (SLP) Description: LTG: Patient will comprehend basic/complex auditory information with cues (SLP). Outcome: Completed/Met   Problem: RH Expression Communication Goal: LTG Patient will increase word finding of common (SLP) Description: LTG:  Patient will increase word finding of common objects/daily info/abstract thoughts with cues using compensatory strategies (SLP). Outcome: Completed/Met   Problem: RH Problem Solving Goal: LTG Patient will demonstrate problem solving for (SLP) Description: LTG:  Patient will demonstrate problem solving for basic/complex daily situations with cues  (SLP) Outcome: Completed/Met   Problem: RH Awareness Goal: LTG: Patient will demonstrate awareness during functional activites type of (SLP) Description: LTG: Patient will demonstrate awareness during functional activites type of (SLP) Outcome: Completed/Met

## 2021-06-24 NOTE — Progress Notes (Signed)
Physical Therapy Discharge Summary  Patient Details  Name: Marissa Morris MRN: [id number] Date of Birth: 1961-09-30  Today's Date: 06/24/2021 PT Individual Time: 0805-0904 PT Individual Time Calculation (min): 59 min    Patient has met 8 of 8 long term goals due to improved activity tolerance, improved balance, improved postural control, increased strength, decreased pain, ability to compensate for deficits, functional use of  right upper extremity and right lower extremity, improved attention, improved awareness, and improved coordination.  Patient to discharge at an ambulatory level Supervision/CGA with RW.   Patient's care partner is independent to provide the necessary physical assistance at discharge.  Reasons goals not met: All LTG met at time of discharge.   Recommendation:  Patient will benefit from ongoing skilled PT services in (pending) home health setting to continue to advance safe functional mobility, address ongoing impairments in balance, gait, motor planning and coordination, and minimize fall risk.  Equipment: Pt is being sent home with Shower chair, RW, bedside commode. Hospital bed and wheelchair orders sent to be delivered to home per Social Worker.   Reasons for discharge: treatment goals met  Patient/family agrees with progress made and goals achieved: Yes    Skilled Intervention Pt received sitting in wheelchair and agreeable to physical therapy session. Gait belt placed prior to initiation of mobility. Completed sensation/coordination/MMT sitting in wheelchair prior to being wheeled to 4w gym   Pt demonstrated proper technique and sequencing with RW management up/down a 6" step to simulate entering home upon discharge safely x2. Verbal cuing to trigger memory of sequencing pattern on the first round. Pt ambulated ~67f x2 RW CGA with verbal cuing for increased step length on LLE to promote step through gait pattern. Verbal/tactile cuing for increased WB/stance on RLE  and to promote upright posture secondary to forward flexed trunk. Pt has demonstrated ability to ambulate further distances prior to discharge so therapist focused on other parts of the discharge.  See below details for additional discharge assessment.   Berg completed with a score of 26/56 (A score of <45 indicates the patient is at an increased risk for falls. Education provided to the patient on the interpretation of balance score.)   Completed car transfer from wc using RW CGA and verbal cuing for sequencing. Pt demonstrated ability to lift RLE up with assistance from her LUE for terminal transitioning into the car. No assistance from UE in order to transition RLE out of car.   Pt sitting in wheelchair, seatbelt alarm on, and call bell within reach at end of session.  No further needs expressed at this time.    PT Discharge Precautions/Restrictions Precautions Precautions: Fall Precaution Comments: Rt hemiparesis  Pain Pain Assessment Pain Scale: 0-10 Pain Score: 0-No pain Faces Pain Scale: Hurts a little bit Pain Type: Chronic pain Pain Location: Knee Pain Orientation: Right RN in room with medications at the start of session  Vision/Perception  Vision - Assessment Eye Alignment: Within Functional Limits Ocular Range of Motion: Within Functional Limits Alignment/Gaze Preference: Within Defined Limits Perception Perception: Within Functional Limits Praxis Praxis: Impaired Praxis Impairment Details: Motor planning Praxis-Other Comments: Intermittent issues with motor planning that are much improved from eval  Cognition Arousal/Alertness: Awake/alert Orientation Level: Oriented X4 Behaviors: Impulsive Safety/Judgment: Impaired Comments: Cont to req min VCs for safety during functional transfers, greatly improved from eval Sensation Sensation Light Touch: Impaired Detail Light Touch Impaired Details: Impaired RLE Additional Comments: Mild R  hemiparesis Coordination Gross Motor Movements are Fluid and Coordinated: No Fine  Motor Movements are Fluid and Coordinated: No Coordination and Movement Description: Affected by Rt hemiparesis as well as balance deficits Motor  Motor Motor: Hemiplegia;Abnormal postural alignment and control Motor - Discharge Observations: mild R hemiparesis, R thoracic scoliosis  Mobility Bed Mobility Bed Mobility: Supine to Sit;Sit to Supine Supine to Sit: Supervision/Verbal cueing Sit to Supine: Supervision/Verbal cueing Transfers Transfers: Sit to Stand;Stand to Sit;Stand Pivot Transfers Sit to Stand: Supervision/Verbal cueing Stand to Sit: Supervision/Verbal cueing Stand Pivot Transfers: Contact Guard/Touching assist;Supervision/Verbal cueing Stand Pivot Transfer Details: Verbal cues for technique;Verbal cues for gait pattern;Verbal cues for safe use of DME/AE Transfer (Assistive device): Rolling walker Locomotion  Gait Ambulation: Yes Gait Assistance: Contact Guard/Touching assist Gait Distance (Feet): 150 Feet Assistive device: Rolling walker Gait Assistance Details: Verbal cues for safe use of DME/AE;Verbal cues for gait pattern;Verbal cues for precautions/safety Gait Assistance Details: continued vc required for increasing step through with LLE; pt self limits d/t R knee pain with PWB Gait Gait: Yes Gait Pattern: Impaired Gait Pattern: Step-to pattern;Decreased step length - left;Decreased stance time - right;Decreased hip/knee flexion - left;Decreased hip/knee flexion - right;Decreased weight shift to right;Right flexed knee in stance;Trunk flexed Gait velocity: Decreased Stairs / Additional Locomotion Stairs: Yes Stairs Assistance: Contact Guard/Touching assist;Minimal Assistance - Patient > 75% Stair Management Technique: With walker Number of Stairs: 1 Height of Stairs: 5 Ramp: Contact Guard/touching assist Curb: Nurse, mental health  Mobility: Yes Wheelchair Assistance: Engineer, maintenance (IT) Propulsion: Right upper extremity;Both lower extermities (Pt overpowers when using LUE) Wheelchair Parts Management: Needs assistance  Trunk/Postural Assessment  Cervical Assessment Cervical Assessment: Exceptions to [facility] (Forward head) Thoracic Assessment Thoracic Assessment: Exceptions to [facility] (R scoliosis with kyphotic posturing) Lumbar Assessment Lumbar Assessment: Exceptions to [facility] (posterior pelvic tilt) Postural Control Postural Control: Deficits on evaluation (WFL in sitting; mild deficits noted in standing requiring UE assist)  Balance Balance Balance Assessed: Yes Standardized Balance Assessment Standardized Balance Assessment: Berg Balance Test Berg Balance Test Sit to Stand: Able to stand  independently using hands Standing Unsupported: Able to stand 2 minutes with supervision Sitting with Back Unsupported but Feet Supported on Floor or Stool: Able to sit safely and securely 2 minutes Stand to Sit: Controls descent by using hands Transfers: Needs one person to assist Standing Unsupported with Eyes Closed: Able to stand 10 seconds with supervision Standing Ubsupported with Feet Together: Needs help to attain position but able to stand for 30 seconds with feet together From Standing, Reach Forward with Outstretched Arm: Can reach forward >12 cm safely (5") From Standing Position, Pick up Object from Floor: Able to pick up shoe, needs supervision From Standing Position, Turn to Look Behind Over each Shoulder: Needs supervision when turning Turn 360 Degrees: Needs assistance while turning Standing Unsupported, Alternately Place Feet on Step/Stool: Needs assistance to keep from falling or unable to try Standing Unsupported, One Foot in Front: Loses balance while stepping or standing Standing on One Leg: Tries to lift leg/unable to hold 3 seconds but remains standing independently Total Score:  26 Static Sitting Balance Static Sitting - Balance Support: Feet supported Static Sitting - Level of Assistance: 6: Modified independent (Device/Increase time) Dynamic Sitting Balance Dynamic Sitting - Balance Support: During functional activity;Feet unsupported Dynamic Sitting - Level of Assistance: 5: Stand by assistance Dynamic Sitting Balance - Compensations: SUP Dynamic Sitting - Balance Activities: Forward lean/weight shifting;Lateral lean/weight shifting;[place];Reaching for objects;Reaching for weighted objects;Reaching across midline Static Standing Balance Static Standing - Balance Support: During functional  activity Static Standing - Level of Assistance: 5: Stand by assistance Dynamic Standing Balance Dynamic Standing - Balance Support: During functional activity;Bilateral upper extremity supported Dynamic Standing - Level of Assistance: 4: Min assist Dynamic Standing - Balance Activities: Lateral lean/weight shifting;Forward lean/weight shifting;Reaching for objects;Reaching for weighted objects;Reaching across midline;Compliant surfaces Extremity Assessment  RLE Strength Right Hip Flexion: 3+/5 Right Hip ABduction: 4-/5 Right Hip ADduction: 4/5 Right Knee Flexion: 3+/5 Right Knee Extension: 4-/5 Right Ankle Dorsiflexion: 4/5 LLE Assessment General Strength Comments: Grossly 4+/5    Shealynn Saulnier, SPT  06/24/2021, 12:25 PM

## 2021-06-24 NOTE — Patient Care Conference (Signed)
Inpatient RehabilitationTeam Conference and Plan of Care Update Date: 06/24/2021   Time: 10:00 AM    Patient Name: Marissa Morris      Medical Record Number: [id number]  Date of Birth: 1961-10-27 Sex: Female         Room/Bed: 6L84T/3M46O-03 Payor Info: Payor: [employer] / Plan: [employer]BS OTHER / Product Type: *No Product type* /    Admit Date/Time:  05/30/2021  4:15 PM  Primary Diagnosis:  Thalamic hemorrhage [facility])  Hospital Problems: Principal Problem:   Thalamic hemorrhage [facility])    Expected Discharge Date: Expected Discharge Date: 06/25/21  Team Members Present: Physician leading conference: Dr. Alysia Penna Care Coodinator Present: Dorien Chihuahua, RN, BSN, CRRN;Christina Sampson Goon, [place] Nurse Present: Dorien Chihuahua, RN PT Present: Barrie Folk, PT OT Present: Other (comment) Georgina Quint OT) SLP Present: Other (comment) Herbert Deaner, SLP) PPS Coordinator present : Gunnar Fusi, SLP     Current Status/Progress Goal Weekly Team Focus  Bowel/Bladder   Pt is cont of B/B. Last BM-7/11  Pt will remain continent of bowel/bladder  assess q shift and PRN   Swallow/Nutrition/ Hydration             ADL's   mod I UBD, UBB at sink, grooming; min LBD, toileting, toilet transfer via stand-pivot; RUE [place] level IV; hand V to VI  S for grooming, min A for ADL/transfers; will have met goals  sit <> stands when able, sitting balance/core, ADL retraining, RUE NMR, hemitechniques, pt/family education   Mobility             Communication   Sup for thought organization with written expression  Sup A  written expression   Safety/Cognition/ Behavioral Observations  min A  min A  short-term recall, carryover of memory notebook and to-do list   Pain   Pt reports moderate pain to R leg. Pain management in progress-effective  Pain<3  assess pain q shift and PRN   Skin   skin intact  Patient's skin will remain intact  will assess q shift and PRN     Discharge  Planning:  Patient discharging home with sister and [name] to assist 24/7 family edu complete   Team Discussion: Neurogenic pain continues ; hx of OA, although pain in knee improved. Patient on target to meet rehab goals: yes, currently mod I bathing at the sink. Min assist for balancing for lower body dressing. CGA sit to stand and ambulation with RW. Supervision for communication and thought organization and memory. Min assist overall with supervision - min assist goals set for discharge.  *See Care Plan and progress notes for long and short-term goals.   Revisions to Treatment Plan:  Working on steps; CGA - Min assist Working on short term carryover strategies   Teaching Needs: Transfers, toileting, medications, secondary stroke risk management, etc.   Current Barriers to Discharge: Decreased caregiver support and Home enviroment access/layout  Possible Resolutions to Barriers: Family education HH follow up services recommended     Medical Summary Current Status: RIght knee OA chronic pain, Thalamic pain  Barriers to Discharge: Other (comments)   Possible Resolutions to Barriers/Weekly Focus: D/C in am, Complete family ed   Continued Need for Acute Rehabilitation Level of Care: The patient requires daily medical management by a physician with specialized training in physical medicine and rehabilitation for the following reasons: Direction of a multidisciplinary physical rehabilitation program to maximize functional independence : Yes Medical management of patient stability for increased activity during participation in an  intensive rehabilitation regime.: Yes Analysis of laboratory values and/or radiology reports with any subsequent need for medication adjustment and/or medical intervention. : Yes   I attest that I was present, lead the team conference, and concur with the assessment and plan of the team.   Dorien Chihuahua B 06/24/2021, 1:50 PM

## 2021-06-24 NOTE — Plan of Care (Signed)
  Problem: RH Balance Goal: LTG: Patient will maintain dynamic sitting balance (OT) Description: LTG:  Patient will maintain dynamic sitting balance with assistance during activities of daily living (OT) Outcome: Completed/Met   Problem: RH Grooming Goal: LTG Patient will perform grooming w/assist,cues/equip (OT) Description: LTG: Patient will perform grooming with assist, with/without cues using equipment (OT) Outcome: Completed/Met   Problem: RH Bathing Goal: LTG Patient will bathe all body parts with assist levels (OT) Description: LTG: Patient will bathe all body parts with assist levels (OT) Outcome: Completed/Met   Problem: RH Dressing Goal: LTG Patient will perform upper body dressing (OT) Description: LTG Patient will perform upper body dressing with assist, with/without cues (OT). Outcome: Completed/Met Goal: LTG Patient will perform lower body dressing w/assist (OT) Description: LTG: Patient will perform lower body dressing with assist, with/without cues in positioning using equipment (OT) Outcome: Completed/Met   Problem: RH Toileting Goal: LTG Patient will perform toileting task (3/3 steps) with assistance level (OT) Description: LTG: Patient will perform toileting task (3/3 steps) with assistance level (OT)  Outcome: Completed/Met   Problem: RH Toilet Transfers Goal: LTG Patient will perform toilet transfers w/assist (OT) Description: LTG: Patient will perform toilet transfers with assist, with/without cues using equipment (OT) Outcome: Completed/Met   Problem: RH Tub/Shower Transfers Goal: LTG Patient will perform tub/shower transfers w/assist (OT) Description: LTG: Patient will perform tub/shower transfers with assist, with/without cues using equipment (OT) Outcome: Completed/Met

## 2021-06-24 NOTE — Progress Notes (Signed)
Patient ID: Marissa Morris, female   DOB: [birth year], 60 y.o.   MRN: [id number]  Patient [facility] referral re faxed to:  [facility] [place], [place] [phone]

## 2021-06-24 NOTE — Progress Notes (Signed)
Occupational Therapy Session Note  Patient Details  Name: Marissa Morris MRN: [id number] Date of Birth: 08-Mar-1961  Today's Date: 06/24/2021 OT Individual Time: 1950-9326 OT Individual Time Calculation (min): 42 min    Short Term Goals:  Week 4:  OT Short Term Goal 1 (Week 4): STG = LTG 2/2 ELOS  Skilled Therapeutic Interventions/Progress Updates:    Pt received in room and consented to OT tx. Pt seen for standing tolerance activity at elevated table while instructed in tan theraputty HEP with beads to increase intrinsic hand strength and [facility] for ADLs. Pt then instructed in seated [facility] activity utilizing tacks and cork board, instructed only to use RUE for increase proficiency with R hand with pt able to complete with 100% accuracy with time. Pt then requested to use restroom. Pt completed SPT from w/c to commode with CGA and cuing for safety. Completed all toileting with SUP and washed hands sink side. After tx, pt left up in w/c with seatbelt alarm on and family present at bedside.   Therapy Documentation Precautions:  Precautions Precautions: Fall Precaution Comments: Rt hemiparesis Restrictions Weight Bearing Restrictions: No General:   Vital Signs: Therapy Vitals Temp: 97.9 F (36.6 C) Pulse Rate: 62 Resp: 17 BP: (!) 152/68 Patient Position (if appropriate): Lying Oxygen Therapy SpO2: 97 % Pain: none      Therapy/Group: Individual Therapy  Jackie Littlejohn 06/24/2021, 7:43 AM

## 2021-06-24 NOTE — Progress Notes (Signed)
Nutrition Follow-up  DOCUMENTATION CODES:   Not applicable  INTERVENTION:  Continue Ensure Enlive po BID, each supplement provides 350 kcal and 20 grams of protein   Encourage adequate PO intake.   NUTRITION DIAGNOSIS:   Increased nutrient needs related to  (therapy) as evidenced by estimated needs; ongoing  GOAL:   Patient will meet greater than or equal to 90% of their needs; met  MONITOR:   PO intake, Supplement acceptance, Skin, Weight trends, Labs, I & O's  REASON FOR ASSESSMENT:   Malnutrition Screening Tool    ASSESSMENT:   60 year old female with history of hypertension presenting with right side weakness nausea vomiting facial droop with dysarthria. 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 Pt underwent right frontal ventriculostomy for hydrocephalus. Due to patient's right side weakness and dysarthria admitted to CIR.  Meal completion has been 40-100% with most intake at 100%. Pt currently has Ensure ordered and has been consuming them. RD to continue with current orders to aid in caloric and protein needs. Plans for discharge tomorrow. Labs and medications reviewed.   Diet Order:   Diet Order             Diet Heart Room service appropriate? Yes; Fluid consistency: Thin  Diet effective now                   EDUCATION NEEDS:   Not appropriate for education at this time  Skin:  Skin Assessment: Reviewed RN Assessment  Last BM:  7/11  Height:   Ht Readings from Last 1 Encounters:  05/30/21 '5\' 3"'  (1.6 m)    Weight:   Wt Readings from Last 1 Encounters:  06/06/21 73.2 kg   BMI:  Body mass index is 28.59 kg/m.  Estimated Nutritional Needs:   Kcal:  1800-2000  Protein:  90-100 grams  Fluid:  >/= 1.8 L/day  Corrin Parker, MS, RD, LDN RD pager number/after hours weekend pager number on Amion.

## 2021-06-24 NOTE — Progress Notes (Signed)
Speech Language Pathology Discharge Summary  Patient Details  Name: Marissa Morris MRN: [id number] Date of Birth: [birth year]  Today's Date: 06/24/2021 SLP Individual Time: 1430-1500 SLP Individual Time Calculation (min): 30 min   Skilled Therapeutic Interventions: Patient was agreeable to skilled ST intervention with focus on cognitive-communication goals. SLP facilitated verbal reasoning/divergent naming task with mod I for additional processing time. Completed word list retention (4 words) and verbal reasoning task with min A verbal cues to recall words and alternate attention. Patient generally able to successfully recall 3/4 words and successful when given semantic cue. Patient verbally summarized tasks completed during ST session for memory notebook with min A verbal cues for thought organization. Patient appeared cognitively fatigued toward end of session and required min-to-mod A verbal cues to recall instructions for task at hand and decreased awareness of errors. Patient was left in chair with alarm activated and needs within reach at end of session. Discharge from [facility] services completed.  Patient has met 5 of 5 long term goals.  Patient to discharge at overall Supervision;Min level.  Reasons goals not met:     Clinical Impression/Discharge Summary: Patient has made excellent gains and has met 5 of 5 LTG's this admission. Patient is currently an overall min A for basic cognitive tasks and sup-to-min A for expressive/receptive language. From a cognitive perspective, patient continues to require sup-to-min A verbal cues to refer to calendar to orient to date, and utilize her memory notebook to record functional information from therapy sessions, to-do lists, and other details that are on her mind. Patient requires encouragement to write in her notebook herself vs. relying on therapist to write in it for her as she has a tendency to ask others to do it for her due to her current handwriting and  difficulty recalling and organizing what to write. She is min A for basic problem solving, awareness of deficits, and ability to identify and repair errors. Min-to-Mod A for working memory and short-term recall of basic verbal and functional information, which is likely secondary to fluctuating attention as she often appears distracted by internal stimuli. From a language perspective, patient is currently at supervision-to-min A level for basic and functional comprehension and verbal expression. Min A for verbal thought organization, with increasingly more support necessary for written expression. Patient demonstrates rare instances of word finding difficulty at conversational level and is generally able to repair breakdowns attributed to word finding difficulty with additional processing time, use of description strategy, and/or gestures. Patient and family education is complete and patient will discharge home with 24 hour supervision from family. Patient would benefit from [facility] f/u SLP services to maximize cognitive and language functions and maximize functional independence.    Care Partner:  Caregiver Able to Provide Assistance: Yes  Type of Caregiver Assistance: Physical;Cognitive  Recommendation:  Home Health SLP  Rationale for SLP Follow Up: Maximize functional communication;Maximize cognitive function and independence   Equipment: NA    Reasons for discharge: Discharged from hospital;Treatment goals met   Patient/Family Agrees with Progress Made and Goals Achieved: Yes    Umaima Scholten T Broox Lonigro 06/24/2021, 3:59 PM

## 2021-06-24 NOTE — Progress Notes (Signed)
PROGRESS NOTE   Subjective/Complaints: Pt with visitors at bedside No new issues ROS: Patient denies CP, SOB, N/V/D   Objective:   No results found. No results for input(s): WBC, HGB, HCT, PLT in the last 72 hours.  No results for input(s): NA, K, CL, CO2, GLUCOSE, BUN, CREATININE, CALCIUM in the last 72 hours.   Intake/Output Summary (Last 24 hours) at 06/24/2021 1008 Last data filed at 06/24/2021 0859 Gross per 24 hour  Intake 400 ml  Output --  Net 400 ml         Physical Exam: Vital Signs Blood pressure (!) 152/68, pulse 62, temperature 97.9 F (36.6 C), resp. rate 17, height '5\' 3"'  (1.6 m), weight 73.2 kg, SpO2 97 %.    General: No acute distress Mood and affect are appropriate Heart: Regular rate and rhythm no rubs murmurs or extra sounds Lungs: Clear to auscultation, breathing unlabored, no rales or wheezes Abdomen: Positive bowel sounds, soft nontender to palpation, nondistended Extremities: No clubbing, cyanosis, or edema Skin: No evidence of breakdown, no evidence of rash   Neurologic: motor strength is 5/5 in left and 3- right deltoid, bicep, tricep, grip, hip flexor, knee extensors, ankle dorsiflexor and plantar flexor Sensation reduced  LT RUE and RLE, reduced R side of face without hypersensitivity or allodynia.   Musculoskeletal: no knee pain with PROM, no effusion  Assessment/Plan: 1. Functional deficits which require 3+ hours per day of interdisciplinary therapy in a comprehensive inpatient rehab setting. Physiatrist is providing close team supervision and 24 hour management of active medical problems listed below. Physiatrist and rehab team continue to assess barriers to discharge/monitor patient progress toward functional and medical goals  Care Tool:  Bathing    Body parts bathed by patient: Right arm, Left arm, Chest, Abdomen, Face, Left lower leg, Front perineal area, Buttocks, Right  upper leg, Left upper leg, Right lower leg   Body parts bathed by helper: Front perineal area, Buttocks, Right upper leg, Right lower leg, Left lower leg     Bathing assist Assist Level: Minimal Assistance - Patient > 75% Assistive Device Comment: at sink from w/c level   Upper Body Dressing/Undressing Upper body dressing   What is the patient wearing?: Pull over shirt    Upper body assist Assist Level: Independent with assistive device Assistive Device Comment: from w/c level; able to retrieve shirt from drawer  Lower Body Dressing/Undressing Lower body dressing      What is the patient wearing?: Pants     Lower body assist Assist for lower body dressing: Minimal Assistance - Patient > 75%     Toileting Toileting Toileting Activity did not occur (Clothing management and hygiene only): N/A (no void or bm)  Toileting assist Assist for toileting: Minimal Assistance - Patient > 75%     Transfers Chair/bed transfer  Transfers assist     Chair/bed transfer assist level: Contact Guard/Touching assist     Locomotion Ambulation   Ambulation assist      Assist level: Minimal Assistance - Patient > 75% Assistive device: Walker-rolling Max distance: 50'   Walk 10 feet activity   Assist  Walk 10 feet activity did not occur: Safety/medical  concerns  Assist level: Minimal Assistance - Patient > 75% Assistive device: Walker-rolling   Walk 50 feet activity   Assist Walk 50 feet with 2 turns activity did not occur: Safety/medical concerns  Assist level: Minimal Assistance - Patient > 75% Assistive device: Walker-rolling    Walk 150 feet activity   Assist Walk 150 feet activity did not occur: Safety/medical concerns         Walk 10 feet on uneven surface  activity   Assist Walk 10 feet on uneven surfaces activity did not occur: Safety/medical concerns         Wheelchair     Assist Will patient use wheelchair at discharge?: No Type of  Wheelchair: Manual    Wheelchair assist level: Minimal Assistance - Patient > 75% Max wheelchair distance: 25    Wheelchair 50 feet with 2 turns activity    Assist        Assist Level: Minimal Assistance - Patient > 75%   Wheelchair 150 feet activity     Assist          Blood pressure (!) 152/68, pulse 62, temperature 97.9 F (36.6 C), resp. rate 17, height '5\' 3"'  (1.6 m), weight 73.2 kg, SpO2 97 %.   Medical Problem List and Plan: 1.  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  Status post right frontal ventriculostomy for hydrocephalus             -patient may  shower             -ELOS/Goals: 7/14 cont CIR PT, OT, SLP   Team conference today please see physician documentation under team conference tab, met with team  to discuss problems,progress, and goals. Formulized individual treatment plan based on medical history, underlying problem and comorbidities.  2.  Impaired mobility, ambulating 100 feet -DVT/anticoagulation: Continue Subcutaneous heparin initiated 05/18/2021             -antiplatelet therapy: N/A 3. 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 Mood: Provigil 100 mg daily             -antipsychotic agents: N/A 5. Neuropsych: This patient is capable of making decisions on her own behalf. 6. Skin/Wound Care: Routine skin checks 7. Fluids/Electrolytes/Nutrition: Routine in and outs with follow-up chemistries 8.  Seizure prophylaxis.  Keppra completed 9.  Hypertension.  Continue Norvasc 10 mg daily, hydralazine 20 mg every 8 hours, lisinopril 40 mg daily, labetalol 200 mg 3 times daily.  Monitor with increased mobility Vitals:   06/23/21 1942 06/24/21 0413  BP: (!) 142/80  (!) 152/68  Pulse: 68 62  Resp: 18 17  Temp: 98.1 F (36.7 C) 97.9 F (36.6 C)  SpO2: 100% 97%  controlled 7/13 10.  Hyperglycemia.  Hemoglobin A1c 5.7.  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 Increased tone on right side, quite well controlled but bothersome to patient: increase baclofen to 1046mS (also with insomnia)  LOS: 25 days A FACE TO FACE EVALUATION WAS PERFORMED  AndCharlett Blake13/2022,  10:08 AM

## 2021-06-25 NOTE — Progress Notes (Signed)
Patient ID: Marissa Morris, female   DOB: [birth year], 60 y.o.   MRN: [id number]  SW followed up with referrals at [facility]. Pt referral is currently under review with the clinical nurses.  [place], [place] [phone]

## 2021-06-25 NOTE — Progress Notes (Signed)
Patient ID: Marissa Morris, female   DOB: 1961-06-16, 60 y.o.   MRN: [id number]  SW followed up with referrals at [facility]. Pt referral accepted for PT OT ST. Services set to begin 7/15 or 7/18.   [place], [place] [phone]

## 2021-06-25 NOTE — Progress Notes (Signed)
Inpatient Rehabilitation Care Coordinator Discharge Note  The overall goal for the admission was met for:   Discharge location: Yes, home  Length of Stay: Yes, 26 Days  Discharge activity level: Yes, Supervision to [place]  Home/community participation: Yes  Services provided included: MD, RD, PT, OT, SLP, RN, CM, TR, Pharmacy, Neuropsych, and SW  Financial Services: Private Insurance: [employer] offered to/list presented to:pt and sister Armed forces training and education officer)  Follow-up services arranged: Home Health: [place] (or additional information): Hospital Bed, Wheelchair, Bedside [facility]  Patient/Family verbalized understanding of follow-up arrangements: Yes  Individual responsible for coordination of the follow-up plan: [name] [phone]  Confirmed correct DME delivered: [name] 06/25/2021    [name]

## 2021-06-26 ENCOUNTER — Telehealth: Payer: Self-pay

## 2021-06-26 NOTE — Telephone Encounter (Signed)
Transition Care Management Unsuccessful Follow-up Telephone Call  Date of discharge and from where:  05/30/2021 [facility] hospital   Attempts:  1st Attempt  Reason for unsuccessful TCM follow-up call:  Left voice message   Unable to reach sister [name] left v/m

## 2021-07-02 ENCOUNTER — Encounter
Payer: BLUE CROSS/BLUE SHIELD | Attending: Physical Medicine & Rehabilitation | Admitting: Physical Medicine & Rehabilitation

## 2021-07-17 ENCOUNTER — Encounter
Payer: BLUE CROSS/BLUE SHIELD | Attending: Physical Medicine & Rehabilitation | Admitting: Physical Medicine & Rehabilitation

## 2021-07-17 ENCOUNTER — Other Ambulatory Visit: Payer: Self-pay

## 2021-07-17 ENCOUNTER — Encounter: Payer: Self-pay | Admitting: Physical Medicine & Rehabilitation

## 2021-07-17 VITALS — BP 143/74 | HR 69 | Temp 97.6°F | Ht 63.0 in | Wt 169.0 lb

## 2021-07-17 DIAGNOSIS — I610 Nontraumatic intracerebral hemorrhage in hemisphere, subcortical: Secondary | ICD-10-CM | POA: Insufficient documentation

## 2021-07-17 MED ORDER — GABAPENTIN 600 MG PO TABS: 600.0000 mg | ORAL_TABLET | Freq: Three times a day (TID) | ORAL | 2 refills | Status: DC

## 2021-07-17 NOTE — Progress Notes (Signed)
Subjective:    Patient ID: Marissa Morris, female    DOB: 21-Aug-1961, 60 y.o.   MRN: [id number]  60 y.o. right-handed female with history of hypertension as well as back surgery.  Per chart review lives alone 1 level home ramped entrance.  Reportedly independent prior to admission.  She has a supportive fianc.  Presented 05/14/2021 on transfer from [facility] after presenting with right side weakness nausea vomiting facial droop and dysarthria.  Noted blood pressure 222/128.  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  Neurosurgery Dr. Hoyt Koch consulted underwent right frontal ventriculostomy for hydrocephalus.  Patient was managed with hydralazine and nicardipine for blood pressure control.  Keppra initiated for seizure prophylaxis and course completed.  Follow-up MRI/MRA showed no substantial change in thalamic hemorrhage with significant intraventricular extension mild hydrocephalus as well as mild edema and mass-effect compared to recent CT.  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  MRA was unremarkable and CT head 05/26/2021 showing interval removal of ventricular catheter with significant decrease in intraventricular hemorrhage.  No new hemorrhage or worsening of mass-effect.  She was cleared to begin subcutaneous heparin for DVT prophylaxis 05/18/2021.  Echocardiogram with ejection fraction of 50 to 55% grade 2 diastolic dysfunction.  Admission chemistries unremarkable except glucose 117 WBC 12,800 urine drug screen negative.  Patient maintained on Cleviprex initially for blood pressure control.  She was initially n.p.o. with alternative means of nutritional support and diet slowly advanced.  Bouts of initial agitation restlessness receiving Zyprexa via.  Therapy evaluations completed due to patient's right side weakness and dysarthria was admitted for a comprehensive rehab program.  Admit date:  05/30/2021 Discharge date: 06/25/2021 HPI Patient returns today accompanied by her nephew who has been helping to care for her and brought her to the visit today.  She lives in [place].  She has a primary care provider in her hometown.  She is receiving home health services.  PT OT anticipates her coming in.  According to nephew speech is coming in once a week and PT OT each twice a week.  Patient continues to require assistance for self-care as well as mobility.  She ambulates with a walker and a gait belt with some physical assistance. No falls reported.  Gabapentin Pain Inventory Average Pain 8 Pain Right Now 9 My pain is stabbing, tingling, and aching  LOCATION OF PAIN  wrist hand fingers knee leg ankle  BOWEL Number of stools per week: 5 Oral laxative use No   BLADDER Normal  Bladder incontinence Yes  Frequent urination Yes     Mobility walk with assistance use a walker ability to climb steps?  no do you drive?  no use a wheelchair needs help with transfers  Function not employed: date last employed 2019 I need assistance with the following:  dressing, bathing, toileting, meal prep, household duties, and shopping  Neuro/Psych bladder control problems weakness numbness tingling trouble walking spasms dizziness anxiety  Prior Studies Any changes since last visit?  no  Physicians involved in your care Primary care Micha Pacifico NP--has seen since discharge   No family history on file. Social History   Socioeconomic History   Marital status: Single    Spouse name: Not on file   Number of children: Not on file   Years of education: Not on file   Highest education level: Not on file  Occupational History   Not on file  Tobacco Use   Smoking status: Never   Smokeless tobacco: Never  Vaping Use   Vaping Use: Never used  Substance and Sexual Activity   Alcohol use: Never   Drug use: Never   Sexual activity: Not Currently  Other Topics Concern    Not on file  Social History Narrative   Not on file   Social Determinants of Health   Financial Resource Strain: Not on file  Food Insecurity: Not on file  Transportation Needs: Not on file  Physical Activity: Not on file  Stress: Not on file  Social Connections: Not on file   No past surgical history on file. No past medical history on file. BP (!) 143/74   Pulse 69   Temp 97.6 F (36.4 C)   Ht 5\' 3"  (1.6 m)   Wt 169 lb (76.7 kg)   SpO2 94%   BMI 29.94 kg/m   Opioid Risk Score:   Fall Risk Score:  `1  Depression screen PHQ 2/9  Depression screen PHQ 2/9 07/17/2021  Decreased Interest 0  Down, Depressed, Hopeless 0  PHQ - 2 Score 0  Altered sleeping 0  Tired, decreased energy 0  Change in appetite 0  Feeling bad or failure about yourself  0  Trouble concentrating 0  Moving slowly or fidgety/restless 0  Suicidal thoughts 0  PHQ-9 Score 0     Review of Systems  Constitutional: Negative.   HENT: Negative.    Eyes: Negative.   Respiratory: Negative.    Cardiovascular: Negative.   Gastrointestinal: Negative.   Endocrine: Negative.   Genitourinary:  Positive for frequency.       Bladder control  Musculoskeletal:  Positive for gait problem.  Skin: Negative.   Allergic/Immunologic: Negative.   Neurological:  Positive for dizziness, weakness and numbness.       Tingling  Hematological: Negative.   Psychiatric/Behavioral:  The patient is nervous/anxious.   All other systems reviewed and are negative.     Objective:   Physical Exam Constitutional:      Appearance: She is obese.  HENT:     Head: Normocephalic and atraumatic.  Eyes:     Extraocular Movements: Extraocular movements intact.     Conjunctiva/sclera: Conjunctivae normal.     Pupils: Pupils are equal, round, and reactive to light.  Cardiovascular:     Rate and Rhythm: Normal rate and regular rhythm.  Pulmonary:     Effort: Pulmonary effort is normal.     Breath sounds: Normal breath sounds.   Abdominal:     General: Abdomen is flat. Bowel sounds are normal.     Palpations: Abdomen is soft.  Musculoskeletal:     Comments: Right knee no evidence of effusion there is no pain to palpation of the medial or lateral joint line.  No pain with range of motion.  Skin:    General: Skin is warm and dry.  Neurological:     Mental Status: She is alert and oriented to person, place, and time.     Cranial Nerves: Cranial nerve deficit present. No facial asymmetry.     Sensory: Sensory deficit present.     Motor: Weakness present.     Coordination: Coordination abnormal. Finger-Nose-Finger Test abnormal and Heel to [facility] Test abnormal.     Gait: Gait abnormal.     Comments: Motor strength is 4/5 in the right deltoid, bicep, tricep, grip, hip flexor, knee extensor, 3 - ankle dorsiflexor and plantar flexor  Left side is 5/5 in the  left deltoid, bicep, tricep, grip, hip flexion, knee extensor, ankle dorsiflexor and plantar flexor Negative straight leg raise testing bilaterally Sensation reduced sensation to pinprick light touch and proprioception in the right upper limb and right lower limb right lower limb has more significant proprioceptive deficits than the upper limb.  She is able to localize light touch on the right side but feels different compared to the left side. Tone no sign of spasticity on the right side.    Psychiatric:        Mood and Affect: Mood normal.        Behavior: Behavior normal.          Assessment & Plan:   #1.  Left thalamic hemorrhage with persistent right hemisensory deficits as well as right facial pain.  She has abnormal feeling of coldness in the right upper extremity however this is not painful.  In terms of her facial pain this does appear to be related to her stroke.  Will order gabapentin 600 mg 3 times daily up from 400 mg 4 times daily. Continue home health PT OT speech Follow-up with physical medicine rehab in 4 months Follow-up with primary  MD Answered multiple questions from the nephew as well as the patient, this includes time course of recovery, ability to work.  Do not feel the patient will be observed return to work due to her CVA deficits.  She is pursuing disability claim

## 2021-07-21 ENCOUNTER — Telehealth: Payer: Self-pay | Admitting: *Deleted

## 2021-07-21 DIAGNOSIS — I610 Nontraumatic intracerebral hemorrhage in hemisphere, subcortical: Secondary | ICD-10-CM

## 2021-07-21 DIAGNOSIS — G9349 Other encephalopathy: Secondary | ICD-10-CM

## 2021-07-21 DIAGNOSIS — I619 Nontraumatic intracerebral hemorrhage, unspecified: Secondary | ICD-10-CM

## 2021-07-21 NOTE — Telephone Encounter (Signed)
Marissa Morris  called and is requesting orders for outpatient therapy.

## 2021-07-22 NOTE — Telephone Encounter (Signed)
Marissa Morris is going to call back with a name of a medical center near her that has an outpt therapy dept.

## 2021-07-23 NOTE — Telephone Encounter (Signed)
Marissa Morris called with : [facility] [address] [place], [place] [address] Phone: [phone] Fax: [phone]  Orders placed for PT/OT/ST.

## 2021-07-24 ENCOUNTER — Encounter: Payer: Self-pay | Admitting: *Deleted

## 2021-07-24 NOTE — Telephone Encounter (Signed)
Opened in error

## 2021-09-23 ENCOUNTER — Telehealth: Payer: Self-pay | Admitting: *Deleted

## 2021-09-23 NOTE — Telephone Encounter (Signed)
Marissa Morris called and reports she is having increased pain in her legs, especially the stroke side. She is taking her gabapentin qid (she reports you told her she could-med list says tid).  She is asking if it can be increased. Please advise.

## 2021-09-24 MED ORDER — GABAPENTIN 800 MG PO TABS: 800.0000 mg | ORAL_TABLET | Freq: Three times a day (TID) | ORAL | 1 refills | Status: DC

## 2021-09-24 NOTE — Telephone Encounter (Signed)
Callde dose change on gabapentin 800 mg tid #90 1RF to her pharmacy and she is aware of the order.

## 2021-09-25 ENCOUNTER — Telehealth: Payer: Self-pay | Admitting: *Deleted

## 2021-09-25 MED ORDER — GABAPENTIN 800 MG PO TABS
800.0000 mg | ORAL_TABLET | Freq: Four times a day (QID) | ORAL | 1 refills | Status: DC
Start: 2021-09-25 — End: 2021-09-29

## 2021-09-25 MED ORDER — GABAPENTIN 800 MG PO TABS
800.0000 mg | ORAL_TABLET | Freq: Four times a day (QID) | ORAL | 1 refills | Status: DC
Start: 2021-09-25 — End: 2021-09-25

## 2021-09-25 NOTE — Telephone Encounter (Signed)
Called new dose verified with Dr Wynn Banker for Gabapentin 800 mg one tablet qid #120 1RF to [id number] in [place]. Mrs Meidinger notified.

## 2021-09-29 ENCOUNTER — Telehealth: Payer: Self-pay | Admitting: *Deleted

## 2021-09-29 MED ORDER — GABAPENTIN 800 MG PO TABS: 800.0000 mg | ORAL_TABLET | Freq: Three times a day (TID) | ORAL | 1 refills | Status: DC

## 2021-09-29 NOTE — Telephone Encounter (Signed)
Marissa Morris called back confused about her Gabapentin dose because the new dose we sent in is 800 mg and what she has is 400 mg and says that is what she has been taking qid all along. I explained to her when we first talked last week she said it was 600 mg but she was taking it qid and not tid as in our records. That is why we increased her to 800 mg qid. Harvel Ricks PA rx'd 400 mg QID when she left the hospital 07/03/21. I could not understand how she has continued with that dose because it was only a 30 day supply and our records showed that Dr Wynn Banker increased it to 600 mg tid at her hospital follow up. I called her pharmacy, and they say she has been getting the 400 mg qid because her primary Marissa Morris was the one prescribing it. It looks like the increased dose from Dr Wynn Banker went [facility] [place] so she never got the increased doses. Marissa Morris has not spoken to her PCP about her pain because Dr Wynn Banker is seeing her for her stroke related pain. She now has the 800 mg dose picked up from pharmacy. Per Dr Wynn Banker she can take the 800 mg tid (2400mg  an increase from the 1600 mg she was taking).

## 2021-10-13 ENCOUNTER — Telehealth: Payer: Self-pay | Admitting: Physical Medicine & Rehabilitation

## 2021-10-13 NOTE — Telephone Encounter (Signed)
Marissa Morris called and said that she started taking the 800 mg gabapentin tablet tid 7 days ago. She was taking 2 of the 400s tid that she had left from previous Rx. But since she started the new 800 mg tablet she first felt like she was having CP and sob within 24 hours. Now she reports she is having involuntary muscle movements and therapist reports decreased muscle tone. She is going to go back to the 400 mg (1/2/tablet of the 800 mg tablet). She wanted to know what Dr Wynn Banker thinks of her reactions to the higher tablet dose.

## 2021-10-13 NOTE — Telephone Encounter (Signed)
Feeling some side effects from medication  . Asking for phone call from nurses .

## 2021-10-13 NOTE — Telephone Encounter (Signed)
Marissa Morris has asked to speak the [employer]. If you will please call her back.

## 2021-10-14 NOTE — Telephone Encounter (Signed)
I have advised her to stay with the 400 mg tid dose and if she continues to have problems then she will need to see PCP or make appt to see Dr Wynn Banker.  A MYCHART activation code was sent to her sisters mobile number for her to set up her mychart.

## 2021-11-17 ENCOUNTER — Other Ambulatory Visit: Payer: Self-pay

## 2021-11-17 ENCOUNTER — Encounter
Payer: BLUE CROSS/BLUE SHIELD | Attending: Physical Medicine & Rehabilitation | Admitting: Physical Medicine & Rehabilitation

## 2021-11-17 ENCOUNTER — Encounter: Payer: Self-pay | Admitting: Physical Medicine & Rehabilitation

## 2021-11-17 VITALS — BP 159/85 | HR 66 | Ht 63.0 in | Wt 166.2 lb

## 2021-11-17 DIAGNOSIS — I610 Nontraumatic intracerebral hemorrhage in hemisphere, subcortical: Secondary | ICD-10-CM | POA: Diagnosis not present

## 2021-11-17 MED ORDER — GABAPENTIN 400 MG PO CAPS: 400.0000 mg | ORAL_CAPSULE | Freq: Four times a day (QID) | ORAL | 5 refills | Status: DC

## 2021-11-17 NOTE — Progress Notes (Signed)
Subjective:    Patient ID: Marissa Morris, female    DOB: Nov 14, 1961, 60 y.o.   MRN: [id number] 60 y.o. right-handed female with history of hypertension as well as back surgery.  Per chart review lives alone 1 level home ramped entrance.  Reportedly independent prior to admission.  She has a supportive fianc.  Presented 05/14/2021 on transfer from [facility] after presenting with right side weakness nausea vomiting facial droop and dysarthria.  Noted blood pressure 222/128.  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  Neurosurgery Dr. Hoyt Koch consulted underwent right frontal ventriculostomy for hydrocephalus.  Patient was managed with hydralazine and nicardipine for blood pressure control.  Keppra initiated for seizure prophylaxis and course completed.  Follow-up MRI/MRA showed no substantial change in thalamic hemorrhage with significant intraventricular extension mild hydrocephalus as well as mild edema and mass-effect compared to recent CT.  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  MRA was unremarkable and CT head 05/26/2021 showing interval removal of ventricular catheter with significant decrease in intraventricular hemorrhage.  No new hemorrhage or worsening of mass-effect.  She was cleared to begin subcutaneous heparin for DVT prophylaxis 05/18/2021.  Echocardiogram with ejection fraction of 50 to 55% grade 2 diastolic dysfunction.  Admission chemistries unremarkable except glucose 117 WBC 12,800 urine drug screen negative.  Patient maintained on Cleviprex initially for blood pressure control.  She was initially n.p.o. with alternative means of nutritional support and diet slowly advanced.  Bouts of initial agitation restlessness receiving Zyprexa via.  Therapy evaluations completed due to patient's right side weakness and dysarthria was admitted for a comprehensive rehab program.  Admit date:  05/30/2021 Discharge date: 06/25/2021 HPI Lives in sister's home which does not have a handicapped accessible bathroom.  The patient is unable to use the bathroom uses a commode chair.  Has not been able to shower.  Trying to get modifications of the bathroom approved from [place] Speech therapy stopped OT and PT once a week each on Gabapentin 400mg  TID ~7a, Noon, ~9p Also diclofenac gel as well as methol/eucalyptus counterirritant cream Pt can dress independantly Needs help with washing back Pt amb with a walker needs physical assist for balance  Sees PCP at [facility] Patient lives in [facility] around [place] Pain Inventory Average Pain 8  Pain Right Now 9 My pain is constant, stabbing, tingling, and aching  LOCATION OF PAIN  thigh knee leg ankle toes   BOWEL Number of stools per week: 14  BLADDER Pads/diapers Bladder incontinence Yes  Frequent urination Yes   Mobility walk with assistance use a walker ability to climb steps?  no do you drive?  no use a wheelchair needs help with transfers  Function disabled: date disabled . I need assistance with the following:  dressing, bathing, toileting, meal prep, household duties, and shopping  Neuro/Psych bladder control problems weakness numbness tingling trouble walking spasms confusion depression anxiety  Prior Studies Any changes since last visit?  no  Physicians involved in your care Any changes since last visit?  no   No family history on file. Social History   Socioeconomic History   Marital status: Single    Spouse name: Not on file   Number of children: Not on file   Years of education: Not on file   Highest education level: Not on file  Occupational History   Not on file  Tobacco Use   Smoking status: Never  Smokeless tobacco: Never  Vaping Use   Vaping Use: Never used  Substance and Sexual Activity   Alcohol use: Never   Drug use: Never   Sexual activity: Not Currently   Other Topics Concern   Not on file  Social History Narrative   Not on file   Social Determinants of Health   Financial Resource Strain: Not on file  Food Insecurity: Not on file  Transportation Needs: Not on file  Physical Activity: Not on file  Stress: Not on file  Social Connections: Not on file   No past surgical history on file. No past medical history on file. BP (!) 159/85   Pulse 66   Ht 5\' 3"  (1.6 m)   Wt 166 lb 3.2 oz (75.4 kg)   SpO2 95%   BMI 29.44 kg/m   Opioid Risk Score:   Fall Risk Score:  `1  Depression screen PHQ 2/9  Depression screen [facility] 2/9 11/17/2021 07/17/2021  Decreased Interest 0 0  Down, Depressed, Hopeless 0 0  PHQ - 2 Score 0 0  Altered sleeping - 0  Tired, decreased energy - 0  Change in appetite - 0  Feeling bad or failure about yourself  - 0  Trouble concentrating - 0  Moving slowly or fidgety/restless - 0  Suicidal thoughts - 0  PHQ-9 Score - 0     Review of Systems  Constitutional: Negative.   HENT: Negative.    Eyes: Negative.   Respiratory: Negative.    Cardiovascular:  Positive for leg swelling.  Gastrointestinal:  Positive for constipation.  Endocrine: Negative.   Genitourinary:  Positive for frequency.       Bladder control  Musculoskeletal:  Positive for arthralgias, gait problem and myalgias.  Skin: Negative.   Neurological:  Positive for weakness and numbness.       Tingling  Hematological: Negative.   Psychiatric/Behavioral:  Positive for dysphoric mood. The patient is nervous/anxious.   All other systems reviewed and are negative.     Objective:   Physical Exam Vitals and nursing note reviewed.  Constitutional:      Appearance: She is normal weight.  HENT:     Head: Normocephalic and atraumatic.  Eyes:     Extraocular Movements: Extraocular movements intact.     Conjunctiva/sclera: Conjunctivae normal.     Pupils: Pupils are equal, round, and reactive to light.  Neurological:     Mental Status: She is  alert and oriented to person, place, and time.     Cranial Nerves: No dysarthria.     Sensory: Sensory deficit present.     Motor: No abnormal muscle tone.     Coordination: Impaired rapid alternating movements.     Gait: Gait abnormal.     Comments: Motor strength is 4/5 in the right deltoid, bicep, tricep, grip, hip flexor, knee extensor, ankle dorsiflexor plantar flexor Sensation reduced to light touch as well as proprioception on the right side compared to the left side.  There is also's hypersensitivity to touch particularly in the right lower extremity compared to the left lower extremity. Negative straight leg raising bilaterally Speech without dysarthria or aphasia  Psychiatric:        Mood and Affect: Mood normal.        Behavior: Behavior normal.          Assessment & Plan:  #1.  History of left thalamic hemorrhage required ventriculostomy.  Overall is making some improvements although she does have persistent sensation abnormalities as well  as gait disorder and decreased balance.  I do think she would benefit from an accessible bathroom. In addition would continue home health PT OT speech with transition outpatient if transportation is available for her. 2.  Thalamic pain syndrome affecting right hemibody mainly upper extremity and lower extremity.  Will increase gabapentin to 400 mg 4 times daily some days she may only need to take it 3 times per day Physical medicine rehab follow-up in 6 months PCP follow-up Referral made for neurosurgery follow-up

## 2021-11-17 NOTE — Patient Instructions (Signed)
Increase gabapentin to 400mg  , 4 times per day, you may take 3 on good days

## 2022-05-18 ENCOUNTER — Encounter: Payer: Self-pay | Attending: Physical Medicine & Rehabilitation | Admitting: Physical Medicine & Rehabilitation

## 2022-05-18 ENCOUNTER — Encounter: Payer: Self-pay | Admitting: Physical Medicine & Rehabilitation

## 2022-05-18 VITALS — BP 148/81 | HR 64 | Ht 63.0 in

## 2022-05-18 DIAGNOSIS — G890 Central pain syndrome: Secondary | ICD-10-CM | POA: Insufficient documentation

## 2022-05-18 DIAGNOSIS — M1711 Unilateral primary osteoarthritis, right knee: Secondary | ICD-10-CM | POA: Insufficient documentation

## 2022-05-18 MED ORDER — GABAPENTIN 600 MG PO TABS: 600.0000 mg | ORAL_TABLET | Freq: Three times a day (TID) | ORAL | 2 refills | Status: DC

## 2022-05-18 NOTE — Patient Instructions (Signed)

## 2022-05-18 NOTE — Progress Notes (Signed)
Subjective:    Patient ID: Marissa Morris, female    DOB: 07-30-1961, 61 y.o.   MRN: [id number]  HPI  61 yo female with hx of left thalamic stoke causing Right hemisensory deficits and moderate neurogenic pain , the pt has noted increasing Right knee pain , hx of Right knee advanced OA which responded to injection during inpt hospitalization post CVA   Had a Right knee injection during her inpt rehab stay 05/30/21-06/25/21 with good relief and is requesting another to help with her severe Right knee pain  Pain Inventory Average Pain 8 Pain Right Now 8 My pain is sharp, stabbing, tingling, and aching  LOCATION OF PAIN  hand, fingers, buttocks, hip, thigh, knee, leg, ankle, toes  BOWEL Number of stools per week: 5-6 Oral laxative use Yes  Type of laxative stool softener as needed Enema or suppository use No  History of colostomy No  Incontinent Yes   BLADDER Normal and Pads Bladder incontinence Yes  Frequent urination Yes  Leakage with coughing No  Difficulty starting stream No  Incomplete bladder emptying Yes    Mobility walk with assistance use a walker ability to climb steps?  no do you drive?  no use a wheelchair needs help with transfers  Function disabled: date disabled . I need assistance with the following:  dressing, bathing, toileting, meal prep, household duties, and shopping  Neuro/Psych bladder control problems weakness numbness tremor tingling trouble walking spasms dizziness confusion anxiety  Prior Studies Any changes since last visit?  no  Physicians involved in your care Any changes since last visit?  no   History reviewed. No pertinent family history. Social History   Socioeconomic History   Marital status: Single    Spouse name: Not on file   Number of children: Not on file   Years of education: Not on file   Highest education level: Not on file  Occupational History   Not on file  Tobacco Use   Smoking status: Never    Smokeless tobacco: Never  Vaping Use   Vaping Use: Never used  Substance and Sexual Activity   Alcohol use: Never   Drug use: Never   Sexual activity: Not Currently  Other Topics Concern   Not on file  Social History Narrative   Not on file   Social Determinants of Health   Financial Resource Strain: Not on file  Food Insecurity: Not on file  Transportation Needs: Not on file  Physical Activity: Not on file  Stress: Not on file  Social Connections: Not on file   History reviewed. No pertinent surgical history. History reviewed. No pertinent past medical history. BP (!) 164/90   Pulse 66   Ht 5\' 3"  (1.6 m)   SpO2 96%   BMI 29.44 kg/m   Opioid Risk Score:   Fall Risk Score:  `1  Depression screen [facility] 2/9     05/18/2022   12:02 PM 11/17/2021   11:24 AM 07/17/2021    2:41 PM  Depression screen PHQ 2/9  Decreased Interest 1 1 0  Down, Depressed, Hopeless 1 1 0  PHQ - 2 Score 2 2 0  Altered sleeping   0  Tired, decreased energy   0  Change in appetite   0  Feeling bad or failure about yourself    0  Trouble concentrating   0  Moving slowly or fidgety/restless   0  Suicidal thoughts   0  PHQ-9 Score   0  Review of Systems  Constitutional: Negative.   HENT: Negative.    Eyes: Negative.   Respiratory:  Positive for shortness of breath.   Cardiovascular:  Positive for leg swelling.  Gastrointestinal:  Positive for constipation and diarrhea.  Endocrine: Negative.   Genitourinary:  Positive for difficulty urinating and frequency.  Musculoskeletal:  Positive for gait problem.  Skin: Negative.   Allergic/Immunologic: Negative.   Neurological:  Positive for dizziness, tremors, weakness and numbness.  Hematological: Negative.   Psychiatric/Behavioral:  Positive for confusion. The patient is nervous/anxious.       Objective:   Physical Exam Vitals and nursing note reviewed.  Constitutional:      Appearance: She is normal weight.  HENT:     Head: Normocephalic  and atraumatic.  Eyes:     Extraocular Movements: Extraocular movements intact.     Conjunctiva/sclera: Conjunctivae normal.     Pupils: Pupils are equal, round, and reactive to light.  Musculoskeletal:     Right knee: No swelling, deformity or erythema. Normal range of motion. Tenderness present.  Skin:    General: Skin is warm and dry.  Neurological:     Mental Status: She is alert and oriented to person, place, and time.     Cranial Nerves: No dysarthria.     Sensory: Sensory deficit present.     Comments: Reduced temp sensation as well as hyper pathia R UE and RLE  non dermatomal pattern  Psychiatric:        Mood and Affect: Mood normal.        Behavior: Behavior normal.  Right medial joint line tenderness at knee  Motor strength 4/5 on RIght side 5/5 on left side       Assessment & Plan:    Hx Left thalamic infarct with Right hemiparesis with RIght thalamic pain syndrome, will increase gabapentin to 600mg  TID Right knee pain hx of advance primary OA of Right knee, last injection ~1 yr ago will repeat today  Knee injection Right (without ultrasound guidance)  Indication:Right Knee pain not relieved by medication management and other conservative care. Informed consent was obtained after describing risks and benefits of the procedure with the patient, this includes bleeding, bruising, infection and medication side effects. The patient wishes to proceed and has given written consent. The patient was placed in a recumbent position. The medial aspect of the knee was marked and prepped with Betadine and alcohol. . Then a21g 2"needle was inserted into the knee joint. After negative draw back for blood, a solution containing one ML of 6mg  per mL betamethasone and 3 mL of 1% lidocaine were injected. The patient tolerated the procedure well. Post procedure instructions were given.

## 2022-08-19 ENCOUNTER — Encounter: Payer: Self-pay | Attending: Physical Medicine & Rehabilitation | Admitting: Physical Medicine & Rehabilitation

## 2022-08-19 ENCOUNTER — Encounter: Payer: Self-pay | Admitting: Physical Medicine & Rehabilitation

## 2022-08-19 VITALS — BP 172/84 | HR 73 | Ht 66.0 in | Wt 175.0 lb

## 2022-08-19 DIAGNOSIS — M1711 Unilateral primary osteoarthritis, right knee: Secondary | ICD-10-CM

## 2022-08-19 MED ORDER — LIDOCAINE HCL (PF) 1 % IJ SOLN
3.0000 mL | Freq: Once | INTRAMUSCULAR | Status: AC
  Administered 2022-08-19: 3 mL

## 2022-08-19 MED ORDER — BETAMETHASONE SOD PHOS & ACET 6 (3-3) MG/ML IJ SUSP
6.0000 mg | Freq: Once | INTRAMUSCULAR | Status: AC
  Administered 2022-08-19: 6 mg via INTRAMUSCULAR

## 2022-08-19 NOTE — Patient Instructions (Signed)

## 2022-08-19 NOTE — Progress Notes (Signed)
Knee injection Right   Indication:RIght Knee pain not relieved by medication management and other conservative care.  Informed consent was obtained after describing risks and benefits of the procedure with the patient, this includes bleeding, bruising, infection and medication side effects. The patient wishes to proceed and has given written consent. The patient was placed in a recumbent position. The medial aspect of the knee was marked and prepped with Betadine and alcohol. It was then entered with a 25-gauge 1-1/2 inch needle and after negative draw back for blood, a solution containing one ML of 6mg  per mL betamethasone and 3 mL of 1% lidocaine were injected. The patient tolerated the procedure well. Post procedure instructions were given.

## 2022-10-07 ENCOUNTER — Encounter: Payer: Self-pay | Attending: Physical Medicine & Rehabilitation | Admitting: Physical Medicine & Rehabilitation

## 2022-10-07 ENCOUNTER — Encounter: Payer: Self-pay | Admitting: Physical Medicine & Rehabilitation

## 2022-10-07 VITALS — BP 154/79 | HR 64 | Ht 66.0 in | Wt 165.8 lb

## 2022-10-07 DIAGNOSIS — I610 Nontraumatic intracerebral hemorrhage in hemisphere, subcortical: Secondary | ICD-10-CM | POA: Insufficient documentation

## 2022-10-07 DIAGNOSIS — M1711 Unilateral primary osteoarthritis, right knee: Secondary | ICD-10-CM | POA: Insufficient documentation

## 2022-10-07 NOTE — Progress Notes (Signed)
Subjective:    Patient ID: Marissa Morris, female    DOB: [birth year], 61 y.o.   MRN: [id number] 61 y.o. right-handed female with history of hypertension as well as back surgery.  Per chart review lives alone 1 level home ramped entrance.  Reportedly independent prior to admission.  She has a supportive fianc.  Presented 05/14/2021 on transfer from [facility] after presenting with right side weakness nausea vomiting facial droop and dysarthria.  Noted blood pressure 222/128.  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  Neurosurgery Dr. Duffy Rhody consulted underwent right frontal ventriculostomy for hydrocephalus.  Patient was managed with hydralazine and nicardipine for blood pressure control.  Keppra initiated for seizure prophylaxis and course completed.  Follow-up MRI/MRA showed no substantial change in thalamic hemorrhage with significant intraventricular extension mild hydrocephalus as well as mild edema and mass-effect compared to recent CT.  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  MRA was unremarkable and CT head 05/26/2021 showing interval removal of ventricular catheter with significant decrease in intraventricular hemorrhage.  No new hemorrhage or worsening of mass-effect.  She was cleared to begin subcutaneous heparin for DVT prophylaxis 05/18/2021.  Echocardiogram with ejection fraction of 50 to 73% grade 2 diastolic dysfunction.  Admission chemistries unremarkable except glucose 117 WBC 12,800 urine drug screen negative.  Patient maintained on Cleviprex initially for blood pressure control.  She was initially n.p.o. with alternative means of nutritional support and diet slowly advanced.  Bouts of initial agitation restlessness receiving Zyprexa via.  Therapy evaluations completed due to patient's right side weakness and dysarthria was admitted for a comprehensive rehab program.  Admit date:  05/30/2021 Discharge date: 06/25/2021 HPI Right > Left knee arthritis, patient had good short-term relief with right knee corticosteroid injection performed on 08/19/2022.  She feels like her pain started coming back the beginning of October.  Reviewed prior x-ray dated 05/31/2021 demonstrating advanced tricompartmental osteoarthritis of the knee. Having a lot of swelling in the legs , has followed up with primary care physician who ordered an echocardiogram. The patient resides in [place] he gets most of her medical care there.  Pain score in the knee is 8 out of 10 worsening with walking and bending and standing.  No falls.  She ambulates with a walker. Pain Inventory Average Pain 8 Pain Right Now 8 My pain is sharp, burning, stabbing, tingling, and aching  In the last 24 hours, has pain interfered with the following? General activity 7 Relation with others 7 Enjoyment of life 7 What TIME of day is your pain at its worst? morning , daytime, and night Sleep (in general) Fair  Pain is worse with: walking, bending, standing, and some activites Pain improves with: therapy/exercise, medication, and injections Relief from Meds: 5  History reviewed. No pertinent family history. Social History   Socioeconomic History   Marital status: Single    Spouse name: Not on file   Number of children: Not on file   Years of education: Not on file   Highest education level: Not on file  Occupational History   Not on file  Tobacco Use   Smoking status: Never   Smokeless tobacco: Never  Vaping Use   Vaping Use: Never used  Substance and Sexual Activity   Alcohol use: Not Currently   Drug use: Not Currently   Sexual activity: Not Currently  Other Topics Concern   Not on file  Social History  Narrative   Not on file   Social Determinants of Health   Financial Resource Strain: Not on file  Food Insecurity: Not on file  Transportation Needs: Not on file  Physical Activity: Not on file   Stress: Not on file  Social Connections: Not on file   History reviewed. No pertinent surgical history. History reviewed. No pertinent surgical history. History reviewed. No pertinent past medical history. Ht _0  (1.676 m)   Wt 165 lb 12.8 oz (75.2 kg)   BMI 26.76 kg/m   Opioid Risk Score:   Fall Risk Score:  `1  Depression screen [facility] 2/9     10/07/2022   12:04 PM 08/19/2022   11:39 AM 05/18/2022   12:02 PM 11/17/2021   11:24 AM 07/17/2021    2:41 PM  Depression screen PHQ 2/9  Decreased Interest 0 0 1 1 0  Down, Depressed, Hopeless 0 0 1 1 0  PHQ - 2 Score 0 0 2 2 0  Altered sleeping     0  Tired, decreased energy     0  Change in appetite     0  Feeling bad or failure about yourself      0  Trouble concentrating     0  Moving slowly or fidgety/restless     0  Suicidal thoughts     0  PHQ-9 Score     0      Review of Systems  Musculoskeletal:  Positive for back pain.  All other systems reviewed and are negative.     Objective:   Physical Exam Vitals and nursing note reviewed.  Constitutional:      Appearance: She is normal weight.  HENT:     Head: Normocephalic and atraumatic.  Eyes:     Extraocular Movements: Extraocular movements intact.     Conjunctiva/sclera: Conjunctivae normal.     Pupils: Pupils are equal, round, and reactive to light.  Musculoskeletal:     Comments: No evidence of right knee effusion, mild joint line tenderness medial and lateral No deformity at the knee.  There is reduced flexion gets to about 90 degrees.  Extension is to -10.  Degrees Left side has no evidence of effusion no erythema no medial or lateral joint line tenderness she has full range of motion the left knee  Ambulates with a walker she has antalgic gait with decreased stance phase on the right side.  Skin:    General: Skin is warm and dry.  Neurological:     Mental Status: She is alert and oriented to person, place, and time.  Psychiatric:        Mood and Affect:  Mood normal.        Behavior: Behavior normal.    Sensation intact light touch bilateral upper and lower limbs but does have reduced temperature sensation in the right upper and right lower limb compared to the left side  Motor strength is 5/5 bilateral deltoid bicep tricep grip hip flexion knee extension ankle dorsiflexion plantarflexion     Assessment & Plan:   1.  History of left thalamic hemorrhage has residual sensory deficit on the right side but no significant motor deficit at this time patient still has some dysesthetic pain has been managed with gabapentin 600 mg 3 times daily her primary care can prescribe this. 2.)  Left knee osteoarthritis fairly advanced on the right side developing right knee contracture.  Only short-term relief corticosteroid injection.  Given the severity of the degenerative  changes do not think that Synvisc or other viscosupplementation would be of benefit.  Recommend referral to orthopedic surgery.  Patient has been instructed to talk to her primary physician in [place] to get somebody locally.  She agrees Physical medicine rehab follow-up on as-needed basis.  The patient lives more than 1 hour away and at this time can have her medical needs met through primary care and orthopedics.

## 2022-10-07 NOTE — Patient Instructions (Addendum)
Please ask your primary care Dr about seeing an orthopedic surgeon for possible Right total knee replacement Orthopedics can also do knee injections y

## 2022-10-08 ENCOUNTER — Telehealth: Payer: Self-pay | Admitting: *Deleted

## 2022-10-08 ENCOUNTER — Other Ambulatory Visit: Payer: Self-pay | Admitting: Physical Medicine & Rehabilitation

## 2022-10-08 NOTE — Telephone Encounter (Signed)
Mrs Murton called and is requesting something for pain.
